# Patient Record
Sex: Male | Born: 1950
Health system: Southern US, Community
[De-identification: ages and names within clinical notes are randomized; demographics above are authoritative.]

## PROBLEM LIST (undated history)

## (undated) DIAGNOSIS — F17200 Nicotine dependence, unspecified, uncomplicated: Secondary | ICD-10-CM

## (undated) DIAGNOSIS — I6529 Occlusion and stenosis of unspecified carotid artery: Secondary | ICD-10-CM

## (undated) DIAGNOSIS — J45909 Unspecified asthma, uncomplicated: Secondary | ICD-10-CM

## (undated) DIAGNOSIS — M1990 Unspecified osteoarthritis, unspecified site: Secondary | ICD-10-CM

## (undated) DIAGNOSIS — E785 Hyperlipidemia, unspecified: Secondary | ICD-10-CM

## (undated) DIAGNOSIS — H409 Unspecified glaucoma: Secondary | ICD-10-CM

## (undated) DIAGNOSIS — I739 Peripheral vascular disease, unspecified: Secondary | ICD-10-CM

## (undated) DIAGNOSIS — I10 Essential (primary) hypertension: Secondary | ICD-10-CM

## (undated) DIAGNOSIS — J449 Chronic obstructive pulmonary disease, unspecified: Secondary | ICD-10-CM

## (undated) HISTORY — PX: KNEE ARTHROSCOPY: SUR90

## (undated) HISTORY — DX: Unspecified glaucoma: H40.9

## (undated) HISTORY — DX: Peripheral vascular disease, unspecified: I73.9

## (undated) HISTORY — DX: Hyperlipidemia, unspecified: E78.5

## (undated) HISTORY — DX: Occlusion and stenosis of unspecified carotid artery: I65.29

## (undated) HISTORY — DX: Essential (primary) hypertension: I10

## (undated) HISTORY — DX: Unspecified osteoarthritis, unspecified site: M19.90

## (undated) HISTORY — DX: Nicotine dependence, unspecified, uncomplicated: F17.200

---

## 2004-10-10 ENCOUNTER — Ambulatory Visit: Payer: Self-pay | Admitting: Family Medicine

## 2004-10-29 ENCOUNTER — Ambulatory Visit (HOSPITAL_COMMUNITY): Admission: RE | Admit: 2004-10-29 | Discharge: 2004-10-29 | Payer: Self-pay | Admitting: Family Medicine

## 2004-10-31 ENCOUNTER — Ambulatory Visit: Payer: Self-pay | Admitting: Family Medicine

## 2004-12-10 ENCOUNTER — Ambulatory Visit: Payer: Self-pay | Admitting: Family Medicine

## 2005-02-03 ENCOUNTER — Ambulatory Visit: Payer: Self-pay | Admitting: Family Medicine

## 2005-06-21 ENCOUNTER — Encounter: Payer: Self-pay | Admitting: Family Medicine

## 2005-06-26 ENCOUNTER — Ambulatory Visit: Payer: Self-pay | Admitting: Family Medicine

## 2005-09-15 ENCOUNTER — Ambulatory Visit: Payer: Self-pay | Admitting: Family Medicine

## 2005-10-06 ENCOUNTER — Ambulatory Visit: Payer: Self-pay | Admitting: Family Medicine

## 2005-11-05 ENCOUNTER — Ambulatory Visit: Payer: Self-pay | Admitting: Family Medicine

## 2005-12-08 ENCOUNTER — Ambulatory Visit: Payer: Self-pay | Admitting: Family Medicine

## 2006-12-17 ENCOUNTER — Ambulatory Visit: Payer: Self-pay | Admitting: Family Medicine

## 2006-12-17 LAB — CONVERTED CEMR LAB
ALT: 10 units/L (ref 0–53)
AST: 17 units/L (ref 0–37)
Albumin: 3.9 g/dL (ref 3.5–5.2)
Alkaline Phosphatase: 72 units/L (ref 39–117)
BUN: 12 mg/dL (ref 6–23)
Basophils Absolute: 0 10*3/uL (ref 0.0–0.1)
Basophils Relative: 1 % (ref 0–1)
Bilirubin, Direct: 0.1 mg/dL (ref 0.0–0.3)
CO2: 26 meq/L (ref 19–32)
Calcium: 9 mg/dL (ref 8.4–10.5)
Chloride: 105 meq/L (ref 96–112)
Cholesterol: 177 mg/dL (ref 0–200)
Creatinine, Ser: 1.01 mg/dL (ref 0.40–1.50)
Eosinophils Absolute: 0.3 10*3/uL (ref 0.0–0.7)
Eosinophils Relative: 5 % (ref 0–5)
Glucose, Bld: 87 mg/dL (ref 70–99)
HCT: 42.3 % (ref 39.0–52.0)
HDL: 42 mg/dL (ref >39)
Hemoglobin: 14.3 g/dL (ref 13.0–17.0)
Indirect Bilirubin: 0.2 mg/dL (ref 0.0–0.9)
LDL Cholesterol: 113 mg/dL — ABNORMAL HIGH (ref 0–99)
Lymphocytes Relative: 41 % (ref 12–46)
Lymphs Abs: 2.6 10*3/uL (ref 0.7–3.3)
MCHC: 33.8 g/dL (ref 30.0–36.0)
MCV: 92.6 fL (ref 78.0–100.0)
Monocytes Absolute: 0.6 10*3/uL (ref 0.2–0.7)
Monocytes Relative: 9 % (ref 3–11)
Neutro Abs: 2.8 10*3/uL (ref 1.7–7.7)
Neutrophils Relative %: 44 % (ref 43–77)
PSA: 1.59 ng/mL (ref 0.10–4.00)
Platelets: 331 10*3/uL (ref 150–400)
Potassium: 4.2 meq/L (ref 3.5–5.3)
RBC: 4.57 M/uL (ref 4.22–5.81)
RDW: 14.1 % — ABNORMAL HIGH (ref 11.5–14.0)
Sodium: 141 meq/L (ref 135–145)
Total Bilirubin: 0.3 mg/dL (ref 0.3–1.2)
Total CHOL/HDL Ratio: 4.2
Total Protein: 6.8 g/dL (ref 6.0–8.3)
Triglycerides: 111 mg/dL (ref <150)
VLDL: 22 mg/dL (ref 0–40)
WBC: 6.3 10*3/uL (ref 4.0–10.5)

## 2008-02-24 DIAGNOSIS — I10 Essential (primary) hypertension: Secondary | ICD-10-CM | POA: Insufficient documentation

## 2008-02-24 DIAGNOSIS — F17200 Nicotine dependence, unspecified, uncomplicated: Secondary | ICD-10-CM | POA: Insufficient documentation

## 2008-03-02 ENCOUNTER — Ambulatory Visit: Payer: Self-pay | Admitting: Family Medicine

## 2008-04-12 ENCOUNTER — Encounter: Payer: Self-pay | Admitting: Family Medicine

## 2008-04-12 LAB — CONVERTED CEMR LAB
BUN: 17 mg/dL (ref 6–23)
Basophils Absolute: 0 10*3/uL (ref 0.0–0.1)
Basophils Relative: 0 % (ref 0–1)
CO2: 26 meq/L (ref 19–32)
Calcium: 9.5 mg/dL (ref 8.4–10.5)
Chloride: 105 meq/L (ref 96–112)
Cholesterol: 203 mg/dL — ABNORMAL HIGH (ref 0–200)
Creatinine, Ser: 1.21 mg/dL (ref 0.40–1.50)
Eosinophils Absolute: 0.3 10*3/uL (ref 0.0–0.7)
Eosinophils Relative: 5 % (ref 0–5)
Glucose, Bld: 88 mg/dL (ref 70–99)
HCT: 43 % (ref 39.0–52.0)
HDL: 40 mg/dL (ref >39)
Hemoglobin: 14.6 g/dL (ref 13.0–17.0)
LDL Cholesterol: 133 mg/dL — ABNORMAL HIGH (ref 0–99)
Lymphocytes Relative: 51 % — ABNORMAL HIGH (ref 12–46)
Lymphs Abs: 3.2 10*3/uL (ref 0.7–4.0)
MCHC: 34 g/dL (ref 30.0–36.0)
MCV: 90.9 fL (ref 78.0–100.0)
Monocytes Absolute: 0.4 10*3/uL (ref 0.1–1.0)
Monocytes Relative: 6 % (ref 3–12)
Neutro Abs: 2.4 10*3/uL (ref 1.7–7.7)
Neutrophils Relative %: 38 % — ABNORMAL LOW (ref 43–77)
PSA: 2.53 ng/mL (ref 0.10–4.00)
Platelets: 340 10*3/uL (ref 150–400)
Potassium: 4.6 meq/L (ref 3.5–5.3)
RBC: 4.73 M/uL (ref 4.22–5.81)
RDW: 14 % (ref 11.5–15.5)
Sodium: 142 meq/L (ref 135–145)
Total CHOL/HDL Ratio: 5.1
Triglycerides: 152 mg/dL — ABNORMAL HIGH (ref <150)
VLDL: 30 mg/dL (ref 0–40)
WBC: 6.4 10*3/uL (ref 4.0–10.5)

## 2008-05-02 ENCOUNTER — Ambulatory Visit: Payer: Self-pay | Admitting: Family Medicine

## 2008-05-03 ENCOUNTER — Ambulatory Visit (HOSPITAL_COMMUNITY): Admission: RE | Admit: 2008-05-03 | Discharge: 2008-05-03 | Payer: Self-pay | Admitting: Family Medicine

## 2008-05-03 ENCOUNTER — Encounter: Payer: Self-pay | Admitting: Family Medicine

## 2008-05-03 LAB — CONVERTED CEMR LAB
Bilirubin Urine: NEGATIVE
Hemoglobin, Urine: NEGATIVE
Ketones, ur: NEGATIVE mg/dL
Leukocytes, UA: NEGATIVE
Nitrite: NEGATIVE
Protein, ur: NEGATIVE mg/dL
Specific Gravity, Urine: 1.023 (ref 1.005–1.03)
Urine Glucose: NEGATIVE mg/dL
Urobilinogen, UA: 1 (ref 0.0–1.0)
pH: 6.5 (ref 5.0–8.0)

## 2008-12-11 ENCOUNTER — Telehealth: Payer: Self-pay | Admitting: Family Medicine

## 2008-12-13 ENCOUNTER — Telehealth: Payer: Self-pay | Admitting: Family Medicine

## 2008-12-19 ENCOUNTER — Ambulatory Visit: Payer: Self-pay | Admitting: Family Medicine

## 2008-12-19 DIAGNOSIS — M5442 Lumbago with sciatica, left side: Secondary | ICD-10-CM | POA: Insufficient documentation

## 2008-12-19 LAB — CONVERTED CEMR LAB
ALT: 15 units/L (ref 0–53)
AST: 19 units/L (ref 0–37)
Albumin: 4.2 g/dL (ref 3.5–5.2)
Alkaline Phosphatase: 80 units/L (ref 39–117)
BUN: 10 mg/dL (ref 6–23)
Bilirubin, Direct: 0.1 mg/dL (ref 0.0–0.3)
CO2: 23 meq/L (ref 19–32)
Calcium: 9 mg/dL (ref 8.4–10.5)
Chloride: 103 meq/L (ref 96–112)
Cholesterol: 208 mg/dL — ABNORMAL HIGH (ref 0–200)
Creatinine, Ser: 0.94 mg/dL (ref 0.40–1.50)
Glucose, Bld: 90 mg/dL (ref 70–99)
HDL: 64 mg/dL (ref >39)
Indirect Bilirubin: 0.5 mg/dL (ref 0.0–0.9)
LDL Cholesterol: 126 mg/dL — ABNORMAL HIGH (ref 0–99)
Potassium: 3.7 meq/L (ref 3.5–5.3)
Sodium: 141 meq/L (ref 135–145)
Total Bilirubin: 0.6 mg/dL (ref 0.3–1.2)
Total CHOL/HDL Ratio: 3.3
Total Protein: 7.2 g/dL (ref 6.0–8.3)
Triglycerides: 89 mg/dL (ref <150)
VLDL: 18 mg/dL (ref 0–40)

## 2009-03-12 ENCOUNTER — Ambulatory Visit: Payer: Self-pay | Admitting: Family Medicine

## 2009-04-04 ENCOUNTER — Ambulatory Visit (HOSPITAL_COMMUNITY): Admission: RE | Admit: 2009-04-04 | Discharge: 2009-04-04 | Payer: Self-pay | Admitting: Orthopaedic Surgery

## 2009-05-28 ENCOUNTER — Telehealth: Payer: Self-pay | Admitting: Family Medicine

## 2009-06-18 ENCOUNTER — Encounter: Payer: Self-pay | Admitting: Family Medicine

## 2009-06-21 LAB — CONVERTED CEMR LAB
Cholesterol: 187 mg/dL (ref 0–200)
HDL: 41 mg/dL (ref >39)
LDL Cholesterol: 122 mg/dL — ABNORMAL HIGH (ref 0–99)
PSA: 2.44 ng/mL (ref 0.10–4.00)
Total CHOL/HDL Ratio: 4.6
Triglycerides: 119 mg/dL (ref <150)
VLDL: 24 mg/dL (ref 0–40)

## 2009-07-17 ENCOUNTER — Telehealth: Payer: Self-pay | Admitting: Family Medicine

## 2009-07-17 ENCOUNTER — Ambulatory Visit: Payer: Self-pay | Admitting: Family Medicine

## 2009-07-17 DIAGNOSIS — J209 Acute bronchitis, unspecified: Secondary | ICD-10-CM | POA: Insufficient documentation

## 2009-07-17 LAB — CONVERTED CEMR LAB: Glucose, Bld: 101 mg/dL

## 2009-10-05 ENCOUNTER — Encounter: Payer: Self-pay | Admitting: Family Medicine

## 2010-06-24 ENCOUNTER — Emergency Department (HOSPITAL_COMMUNITY): Admission: EM | Admit: 2010-06-24 | Discharge: 2010-06-24 | Payer: Self-pay | Admitting: Emergency Medicine

## 2010-06-27 ENCOUNTER — Ambulatory Visit: Payer: Self-pay | Admitting: Family Medicine

## 2010-06-27 DIAGNOSIS — R5381 Other malaise: Secondary | ICD-10-CM | POA: Insufficient documentation

## 2010-06-27 DIAGNOSIS — R5383 Other fatigue: Secondary | ICD-10-CM

## 2010-06-27 DIAGNOSIS — J440 Chronic obstructive pulmonary disease with acute lower respiratory infection: Secondary | ICD-10-CM

## 2010-06-27 DIAGNOSIS — J209 Acute bronchitis, unspecified: Secondary | ICD-10-CM | POA: Insufficient documentation

## 2010-09-07 ENCOUNTER — Emergency Department (HOSPITAL_COMMUNITY): Admission: EM | Admit: 2010-09-07 | Discharge: 2010-09-07 | Payer: Self-pay | Admitting: Emergency Medicine

## 2010-10-10 ENCOUNTER — Telehealth: Payer: Self-pay | Admitting: Family Medicine

## 2010-11-21 ENCOUNTER — Ambulatory Visit: Admit: 2010-11-21 | Payer: Self-pay | Admitting: Family Medicine

## 2010-11-26 NOTE — Assessment & Plan Note (Signed)
Summary: office visit   Vital Signs:  Patient Profile:   60 Years Old Male Height:     74 inches (187.96 cm) Weight:      189 pounds (85.91 kg) BMI:     24.35 BSA:     2.12 O2 Sat:      92 % Pulse rate:   75 / minute Resp:     16 per minute BP sitting:   160 / 90  (left arm)  Vitals Entered By: Everitt Amber (December 19, 2008 10:42 AM)                 Chief Complaint:  Chest cold, coughing, and nasal congestion.  History of Present Illness: 1 week h/o head and chest congestion, yellow sputum no fever  or chills.  Intermitteny low back pain, advil relieves it. He reports recent fever and chills. He denies depression, anxiety or insomnia. He is  compliant with bP meds.    Current Allergies: ! PENICILLIN    Risk Factors:  Tobacco use:  current Drug use:  no Alcohol use:  yes Seatbelt use:  100 %   Review of Systems  General      See HPI      Complains of chills and fever.  ENT      See HPI      Complains of nasal congestion, postnasal drainage, sinus pressure, and sore throat.      Denies earache.  CV      Denies chest pain or discomfort, difficulty breathing while lying down, palpitations, and swelling of feet.  Resp      See HPI      Complains of cough, shortness of breath, and sputum productive.  GI      Denies abdominal pain, constipation, diarrhea, nausea, and vomiting.  GU      Denies dysuria, urinary frequency, and urinary hesitancy.  MS      Complains of joint pain and stiffness.  Derm      Denies itching, lesion(s), and rash.  Psych      Denies anxiety and depression.   Physical Exam  General:     HEENT: No facial asymmetry,  EOMI, positive sinus tenderness, TM's Clear, oropharynx  pink and moist.   Chest: decreased air entry, bilateral crackles and whezes CVS: S1, S2, No murmurs, No S3.   Abd: Soft, Nontender.  MS: Adequate ROM spine, hips, shoulders and knees.  Ext: No edema.   CNS: CN 2-12 intact, power tone and  sensation normal throughout.   Skin: Intact, no visible lesions or rashes.  Psych: Good eye contact, normal effect.  Memory intact, not anxious or depressed appearing.     Impression & Recommendations:  Problem # 1:  HYPERTENSION (ICD-401.9) Assessment: Comment Only  The following medications were removed from the medication list:    Amlodipine Besylate 5 Mg Tabs (Amlodipine besylate) ..... One tab by mouth qd  His updated medication list for this problem includes:    Amlodipine Besylate 10 Mg Tabs (Amlodipine besylate) .Marland Kitchen... Take 1 tablet by mouth once a day  Orders: T-Basic Metabolic Panel [id number]  BP today: 160/90  Labs Reviewed: Creat: 1.21 (04/12/2008) Chol: 203 (04/12/2008)   HDL: 40 (04/12/2008)   LDL: 133 (04/12/2008)   TG: 152 (04/12/2008)   Problem # 2:  NICOTINE ADDICTION (ICD-305.1) Assessment: Unchanged Encouraged smoking cessation and discussed different methods for smoking cessation.   Problem # 3:  OTHER AND UNSPECIFIED HYPERLIPIDEMIA (ICD-272.4) Assessment: Comment Only  Labs Reviewed:  Chol: 203 (04/12/2008)   HDL: 40 (04/12/2008)   LDL: 133 (04/12/2008)   TG: 152 (04/12/2008), low fat diet discussed and encouraged SGOT: 17 (12/17/2006)   SGPT: 10 (12/17/2006)   Problem # 4:  ACUTE BRONCHITIS (ICD-466.0) Assessment: Comment Only  His updated medication list for this problem includes:    Septra Ds 800-160 Mg Tabs (Sulfamethoxazole-trimethoprim) .Marland Kitchen... Take 1 tablet by mouth two times a day    Tessalon Perles 100 Mg Caps (Benzonatate) .Marland Kitchen... Take 1 capsule by mouth three times a day  Take antibiotics and other medications as directed. Encouraged to push clear liquids, get enough rest, and take acetaminophen as needed. To be seen in 5-7 days if no improvement, sooner if worse.   Problem # 5:  ACUTE SINUSITIS, UNSPECIFIED (ICD-461.9) Assessment: Comment Only  His updated medication list for this problem includes:    Septra Ds 800-160 Mg Tabs  (Sulfamethoxazole-trimethoprim) .Marland Kitchen... Take 1 tablet by mouth two times a day    Tessalon Perles 100 Mg Caps (Benzonatate) .Marland Kitchen... Take 1 capsule by mouth three times a day Instructed on treatment. Call if symptoms persist or worsen.   Complete Medication List: 1)  Aspirin 81 Mg Tbec (Aspirin) .... Take 1 tablet by mouth once a day 2)  Septra Ds 800-160 Mg Tabs (Sulfamethoxazole-trimethoprim) .... Take 1 tablet by mouth two times a day 3)  Tessalon Perles 100 Mg Caps (Benzonatate) .... Take 1 capsule by mouth three times a day 4)  Amlodipine Besylate 10 Mg Tabs (Amlodipine besylate) .... Take 1 tablet by mouth once a day  Other Orders: T-Hepatic Function [id number]) T-Lipid Profile [id number])   Patient Instructions: 1)  Please schedule a follow-up appointment in 2 months. 2)  BMP prior to visit, ICD-9: 3)  Hepatic Panel prior to visit, ICD-9:  today 4)  Lipid Panel prior to visit, ICD-9: 5)  The BP med is INCREASED to 10mg  daily, take two 5mg  tabs till finished. 6)  Meds are sent for your bronchitis and sinusitis   Prescriptions: AMLODIPINE BESYLATE 10 MG TABS (AMLODIPINE BESYLATE) Take 1 tablet by mouth once a day  #30 x 2   Entered and Authorized by:   Syliva Overman MD   Signed by:   Syliva Overman MD on 12/19/2008   Method used:   Electronically to        [facility] [phone]* (retail)       [address]       [place], [place]  [address]       Ph: [phone] or [phone]       Fax: [phone]   RxID:   [id number] TESSALON PERLES 100 MG CAPS (BENZONATATE) Take 1 capsule by mouth three times a day  #30 x 0   Entered and Authorized by:   Syliva Overman MD   Signed by:   Syliva Overman MD on 12/19/2008   Method used:   Electronically to        [facility] [phone]* (retail)       [address]       [place], [place]  [address]       Ph: [phone] or [phone]       Fax: [phone]   RxID:    [id number] SEPTRA DS 800-160 MG TABS (SULFAMETHOXAZOLE-TRIMETHOPRIM) Take 1 tablet by mouth two times a day  #20 x 0   Entered and Authorized by:   Syliva Overman  MD   Signed by:   Syliva Overman MD on 12/19/2008   Method used:   Electronically to        [facility] [phone]* (retail)       [address]       [place], [place]  [address]       Ph: [phone] or [phone]       Fax: [phone]   RxID:   [id number]

## 2010-11-26 NOTE — Miscellaneous (Signed)
Summary: refill  Clinical Lists Changes  Medications: Rx of NORVASC 10 MG TABS (AMLODIPINE BESYLATE) Take 1 tablet by mouth once a day;  #90 Tablet x 2;  Signed;  Entered by: Worthy Keeler LPN;  Authorized by: Syliva Overman MD;  Method used: Electronically to [facility]. [phone]*, [address] [place], [place], [place]  [address], Ph: [phone] or [phone], Fax: [phone]    Prescriptions: NORVASC 10 MG TABS (AMLODIPINE BESYLATE) Take 1 tablet by mouth once a day  #90 Tablet x 2   Entered by:   Worthy Keeler LPN   Authorized by:   Syliva Overman MD   Signed by:   Worthy Keeler LPN on 52/84/1324   Method used:   Electronically to        [facility] [phone]* (retail)       [address]       [place], [place]  [address]       Ph: [phone] or [phone]       Fax: [phone]   RxID:   [id number]

## 2010-11-26 NOTE — Progress Notes (Signed)
Summary: HAS A COLD  Phone Note Call from Patient   Summary of Call: NEEDS ANT. FOR HIS COLD      [facility] IN [place] CALL HIM BACK TO LET HIM KNOW    [phone] HAS APPT. IN FIRST OF OCTOBER FOR PHY. Initial call taken by: Lind Guest,  July 17, 2009 1:40 PM  Follow-up for Phone Call        stuffy nose, congestion, cough wth clear sputum, no fever, no chills, no body aches Follow-up by: Worthy Keeler LPN,  July 17, 2009 1:58 PM  Additional Follow-up for Phone Call Additional follow up Details #1::        pls work in this week, uncontrolled blood pressure, alos advise  and erx tessalon perles 100mg  Take 1 capsule by mouth three times a day 330 only, his last visit was in may Additional Follow-up by: Syliva Overman MD,  July 17, 2009 2:35 PM    Additional Follow-up for Phone Call Additional follow up Details #2::    rx sent , called patient, left message Follow-up by: Worthy Keeler LPN,  July 17, 2009 2:51 PM  Additional Follow-up for Phone Call Additional follow up Details #3:: Details for Additional Follow-up Action Taken: patient aware Additional Follow-up by: Worthy Keeler LPN,  July 17, 2009 2:59 PM  New/Updated Medications: TESSALON PERLES 100 MG CAPS (BENZONATATE) one tab by mouth tid Prescriptions: TESSALON PERLES 100 MG CAPS (BENZONATATE) one tab by mouth tid  #30 x 0   Entered by:   Worthy Keeler LPN   Authorized by:   Syliva Overman MD   Signed by:   Worthy Keeler LPN on 16/07/9603   Method used:   Electronically to        [facility] [phone]* (retail)       [address]       [place], [place]  [address]       Ph: [phone] or [phone]       Fax: [phone]   RxID:   [id number]

## 2010-11-26 NOTE — Progress Notes (Signed)
Summary: rx refill  Phone Note Call from Patient   Summary of Call: has appt on 12/19/2008 to see [place]. pt out of blood pressure pills. can doc call in enough until he comes in [place] [phone] Initial call taken by: Rudene Anda,  December 13, 2008 1:28 PM      Prescriptions: AMLODIPINE BESYLATE 5 MG TABS (AMLODIPINE BESYLATE) one tab by mouth qd  #30 x 2   Entered by:   Everitt Amber   Authorized by:   Syliva Overman MD   Signed by:   Everitt Amber on 12/13/2008   Method used:   Electronically to        [facility] [phone]* (retail)       [address]       [place], [place]  [address]       Ph: [phone] or [phone]       Fax: [phone]   RxID:   [id number]

## 2010-11-26 NOTE — Progress Notes (Signed)
Summary: REFILL  Phone Note Call from Patient   Summary of Call: NEEDS AMLOEITIN 5MG   1 PER DAY FAXED OVER TO [facility] IN [place]  Initial call taken by: Lind Guest,  December 11, 2008 1:35 PM  Follow-up for Phone Call        refill x 1 only, needs ov let hiom know Follow-up by: Syliva Overman MD,  December 11, 2008 2:51 PM    Additional Follow-up for Phone Call Additional follow up Details #2::    pls give appt in the next 2 to 3 weeks he NEEDS to come in Follow-up by: Syliva Overman MD,  December 11, 2008 2:53 PM  New/Updated Medications: AMLODIPINE BESYLATE 5 MG TABS (AMLODIPINE BESYLATE) one tab by mouth qd   Prescriptions: AMLODIPINE BESYLATE 5 MG TABS (AMLODIPINE BESYLATE) one tab by mouth qd  #30 x 0   Entered by:   Worthy Keeler LPN   Authorized by:   Syliva Overman MD   Signed by:   Worthy Keeler LPN on 16/07/9603   Method used:   Electronically to        [facility] [phone]* (retail)       [address]       [place], [place]  [address]       Ph: [phone] or [phone]       Fax: [phone]   RxID:   [id number]   Appended Document: REFILL LEFT MESSAGE YESTERDAY AND TODAY

## 2010-11-26 NOTE — Progress Notes (Signed)
Summary: MEDS  Phone Note Call from Patient   Summary of Call: NEEDS HIS BP MEDS FILLED AT [facility] IN [place]  Initial call taken by: Lind Guest,  May 28, 2009 1:38 PM    Prescriptions: NORVASC 10 MG TABS (AMLODIPINE BESYLATE) Take 1 tablet by mouth once a day  #30 Tablet x 3   Entered by:   Worthy Keeler LPN   Authorized by:   Syliva Overman MD   Signed by:   Worthy Keeler LPN on 16/07/9603   Method used:   Electronically to        [facility] [phone]* (retail)       [address]       [place], [place]  [address]       Ph: [phone] or [phone]       Fax: [phone]   RxID:   [id number]

## 2010-11-26 NOTE — Assessment & Plan Note (Signed)
Summary: F UP FROM HOS   Vital Signs:  Patient profile:   60 year old male Height:      74 inches Weight:      178.25 pounds BMI:     22.97 O2 Sat:      98 % Pulse rate:   70 / minute Pulse rhythm:   regular Resp:     16 per minute BP sitting:   130 / 80  (right arm)  Vitals Entered By: Everitt Amber LPN (June 27, 2010 9:04 AM) CC: ER follow up. States he went to the ER Monday because he was having trouble breathing. He could breathe if he went outside but once he went back in and laid down he would have problems again. They gave him an inhaler and some pills and he has cut back tremendously on his smoking. Wants to see about getting the patch today    CC:  ER follow up. States he went to the ER Monday because he was having trouble breathing. He could breathe if he went outside but once he went back in and laid down he would have problems again. They gave him an inhaler and some pills and he has cut back tremendously on his smoking. Wants to see about getting the patch today .  History of Present Illness: Pt recently evaluated in the ED for acute exaccerbation of COPD, he feels netter following treartment AND IS NOW COMMITED TO QUITTING SMOKING. hE DOES REPORT GREEN SPUTUM AND INTERMITTENT CHILLS. l.  Denies sinus pressure, nasal congestion , ear pain or sore throat.  Denies chest pain, palpitations, PND, orthopnea or leg swelling. Denies abdominal pain, nausea, vomitting, diarrhea or constipation. Denies change in bowel movements or bloody stool. Denies dysuria , frequency, incontinence or hesitancy. Denies  joint pain, swelling, or reduced mobility. Denies headaches, vertigo, seizures. Denies depression, anxiety or insomnia. Denies  rash, lesions, or itch.     Preventive Screening-Counseling & Management  Alcohol-Tobacco     Smoking Status: current     Packs/Day: 0.25  Current Medications (verified): 1)  Aspirin 81 Mg  Tbec (Aspirin) .... Take 1 Tablet By Mouth Once  A Day 2)  Norvasc 10 Mg Tabs (Amlodipine Besylate) .... Take 1 Tablet By Mouth Once A Day  Allergies (verified): 1)  ! Penicillin  Review of Systems      See HPI General:  Complains of chills, fatigue, and malaise. Eyes:  Denies discharge, eye pain, and red eye. Resp:  Complains of cough, shortness of breath, and sputum productive; pt developed acute dyspnea with shortness of breath, wheeze and sputum which was green, he went to the Ed , got 2 neb treatments is on prodnisone 50mg  x 5 days anda bentolion inhaler. He smokes on avg 3 ciggs /dayNo fever or chills. feels well, still has scant yellow sputum. Endo:  Denies cold intolerance, excessive thirst, excessive urination, and heat intolerance. Heme:  Denies abnormal bruising and bleeding. Allergy:  Complains of seasonal allergies; denies hives or rash, itching eyes, and persistent infections.  Physical Exam  General:  Well-developed,well-nourished,in no acute distress; alert,appropriate and cooperative throughout examination HEENT: No facial asymmetry,  EOMI, No sinus tenderness, TM's Clear, oropharynx  pink and moist.   Chest:decreased air entry bilaterally, scattered crackles, no wheezes CVS: S1, S2, No murmurs, No S3.   Abd: Soft, Nontender.  MS: Adequate ROM spine, hips, shoulders and knees.  Ext: No edema.   CNS: CN 2-12 intact, power tone and sensation normal throughout.  Skin: Intact, no visible lesions or rashes.  Psych: Good eye contact, normal affect.  Memory intact, not anxious or depressed appearing.    Impression & Recommendations:  Problem # 1:  COPD (ICD-496) Assessment Deteriorated depomedrol administered  Problem # 2:  ACUTE BRONCHITIS (ICD-466.0) Assessment: Comment Only  The following medications were removed from the medication list:    Septra Ds 800-160 Mg Tabs (Sulfamethoxazole-trimethoprim) .Marland Kitchen... Take 1 tablet by mouth two times a day    Tessalon Perles 100 Mg Caps (Benzonatate) .Marland Kitchen... Take 1 capsule  by mouth three times a day His updated medication list for this problem includes:    Sulfamethoxazole-trimethoprim 400-80 Mg Tabs (Sulfamethoxazole-trimethoprim) .Marland Kitchen... Take 1 tablet by mouth two times a day  Orders: Depo- Medrol 80mg  (J1040) Admin of Therapeutic Inj  intramuscular or subcutaneous ([address])  Problem # 3:  NICOTINE ADDICTION (ICD-305.1) Assessment: Improved  Encouraged smoking cessation and discussed different methods for smoking cessation.  pt decided against trhe patch, smoking on avg 3/day ready to quit  Problem # 4:  HYPERTENSION (ICD-401.9) Assessment: Unchanged  The following medications were removed from the medication list:    Norvasc 10 Mg Tabs (Amlodipine besylate) .Marland Kitchen... Take 1 tablet by mouth once a day His updated medication list for this problem includes:    Amlodipine Besylate 10 Mg Tabs (Amlodipine besylate) .Marland Kitchen... Take 1 tablet by mouth once a day  Orders: T-Basic Metabolic Panel [id number])  BP today: 130/80 Prior BP: 122/82 (07/17/2009)  Labs Reviewed: K+: 3.7 (12/19/2008) Creat: : 0.94 (12/19/2008)   Chol: 187 (06/18/2009)   HDL: 41 (06/18/2009)   LDL: 122 (06/18/2009)   TG: 119 (06/18/2009)  Problem # 5:  OTHER AND UNSPECIFIED HYPERLIPIDEMIA (ICD-272.4) Assessment: Comment Only  Labs Reviewed: SGOT: 19 (12/19/2008)   SGPT: 15 (12/19/2008)   HDL:41 (06/18/2009), 64 (12/19/2008)  LDL:122 (06/18/2009), 126 (12/19/2008)  Chol:187 (06/18/2009), 208 (12/19/2008)  Trig:119 (06/18/2009), 89 (12/19/2008) low faT DIET DISCAUSSED AND ENCOURAGED  Complete Medication List: 1)  Aspirin 81 Mg Tbec (Aspirin) .... Take 1 tablet by mouth once a day 2)  Sulfamethoxazole-trimethoprim 400-80 Mg Tabs (Sulfamethoxazole-trimethoprim) .... Take 1 tablet by mouth two times a day 3)  Amlodipine Besylate 10 Mg Tabs (Amlodipine besylate) .... Take 1 tablet by mouth once a day  Other Orders: T-Lipid Profile ([id number]) T-CBC w/Diff ([id number]) T-PSA  ([id number])  Patient Instructions: 1)  CPE in  in  5.39months 2)  I am glad that you are feeling better.You will get an injection for the breathing , depomedrol, and antibiotic is sent toyour pharmacy. 3)  BMP prior to visit, ICD-9: 4)  Lipid Panel prior to visit, ICD-9: 5)  CBC w/ Diff prior to visit, ICD-9:   fasting in 5.5 months 6)  PSA prior to visit, ICD-9: Prescriptions: AMLODIPINE BESYLATE 10 MG TABS (AMLODIPINE BESYLATE) Take 1 tablet by mouth once a day  #90 x 1   Entered and Authorized by:   Syliva Overman MD   Signed by:   Syliva Overman MD on 06/27/2010   Method used:   Electronically to        [facility] [phone]* (retail)       [address]       [place], [place]  [address]       Ph: [phone] or [phone]       Fax: [phone]   RxID:   [id number] SULFAMETHOXAZOLE-TRIMETHOPRIM 400-80 MG TABS (SULFAMETHOXAZOLE-TRIMETHOPRIM) Take 1 tablet by mouth two times a day  #  20 x 0   Entered and Authorized by:   Syliva Overman MD   Signed by:   Syliva Overman MD on 06/27/2010   Method used:   Electronically to        [facility]. [phone]* (retail)       [address]       [place], [place]  [address]       Ph: [phone] or [phone]       Fax: [phone]   RxID:   [id number]    Medication Administration  Injection # 1:    Medication: Depo- Medrol 80mg     Diagnosis: ACUTE BRONCHITIS (ICD-466.0)    Route: IM    Site: RUOQ gluteus    Exp Date: 03/2011    Lot #: [id number]    Mfr: [employer]    Comments: 80mg  given     Patient tolerated injection without complications    Given by: Everitt Amber LPN (June 27, 2010 10:06 AM)  Orders Added: 1)  Est. Patient Level IV [16967] 2)  T-Basic Metabolic Panel [[id number]] 3)  T-Lipid Profile [[id number]] 4)  T-CBC w/Diff [[id number]] 5)  T-PSA [[id number]] 6)  Depo- Medrol 80mg  [J1040] 7)  Admin of Therapeutic Inj  intramuscular or subcutaneous [15400]

## 2010-11-26 NOTE — Assessment & Plan Note (Signed)
Summary: OV   Vital Signs:  Patient profile:   60 year old male Height:      74 inches Weight:      175.44 pounds BMI:     22.61 O2 Sat:      97 % Temp:     98.8 degrees F oral Pulse rate:   85 / minute Pulse rhythm:   regular Resp:     16 per minute BP sitting:   122 / 82  (left arm) Cuff size:   regular  Vitals Entered By: Everitt Amber (July 17, 2009 3:21 PM) CC: nasal drainage, clear, coughing, sneezing for about a week now Is Patient Diabetic? No Pain Assessment Patient in pain? no        CC:  nasal drainage, clear, coughing, and sneezing for about a week now.  History of Present Illness: Pt presents with a  2week h/o worsening had and cheswt congeswtion. he has had intermittent fever and chills, he feels fatigued, his sputum is yellow. Otherwise the pt has been doing well prior to onset of current symptoms. He unfortunately still smokes and is not yet commited to disconti nuing the habit..   Current Medications (verified): 1)  Aspirin 81 Mg  Tbec (Aspirin) .... Take 1 Tablet By Mouth Once A Day 2)  Norvasc 10 Mg Tabs (Amlodipine Besylate) .... Take 1 Tablet By Mouth Once A Day  Allergies (verified): 1)  ! Penicillin  Past History:  Past medical, surgical, family and social histories (including risk factors) reviewed for relevance to current acute and chronic problems.  Past Medical History: Reviewed history from 02/24/2008 and no changes required. NICOTINE ADDICTION (ICD-305.1) HYPERTENSION (ICD-401.9)  Past Surgical History: Reviewed history from 02/24/2008 and no changes required. Arthroscopy both knees   Family History: Reviewed history from 02/24/2008 and no changes required. NO CHILDREN MOTHER DECEASED  HEART ATTACK / HYPERTENSIVE / DIABETIC FATHER  DECEASED  ? THREE SISTERS LIVING / ONE HYPERTENSIVE FIVE BROTHERS LIVING / ONE HYPERTENSIVE  Social History: Reviewed history from 02/24/2008 and no changes required. EMPLOYED Single Current  Smoker Alcohol use-yes Drug use-no  Review of Systems      See HPI General:  Complains of chills, fatigue, fever, and weight loss; 2 week history of chills and fever.. Eyes:  Denies blurring and discharge. ENT:  Complains of nasal congestion and postnasal drainage; denies sinus pressure and sore throat; 2 week history, clear nasal drainage. CV:  Denies chest pain or discomfort, palpitations, and swelling of feet. Resp:  Complains of cough, shortness of breath, sputum productive, and wheezing; sputum is yellow x 2 weeks. GI:  Denies abdominal pain, constipation, diarrhea, nausea, and vomiting. GU:  Denies nocturia, urinary frequency, and urinary hesitancy. MS:  Denies joint pain, low back pain, mid back pain, and stiffness. Derm:  Denies itching and rash. Psych:  Denies anxiety and depression. Allergy:  Denies hives or rash and sneezing.  Physical Exam  General:  alert, well-nourished, and well-hydrated. Ill appearing HEENT: No facial asymmetry,  EOMI, No sinus tenderness, TM's Clear, oropharynx  pink and moist. Erythema and edma of nasal mucosa.  Chest: decreased air entry bilaterally, scattered crackles and wheezes. CVS: S1, S2, No murmurs, No S3.   Abd: Soft, Nontender.  MS: Adequate ROM spine, hips, shoulders and knees.  Ext: No edema.   CNS: CN 2-12 intact, power tone and sensation normal throughout.   Skin: Intact, no visible lesions or rashes.  Psych: Good eye contact, normal affect.  Memory intact, not anxious  or depressed appearing.     Impression & Recommendations:  Problem # 1:  ACUTE BRONCHITIS (ICD-466.0) Assessment Comment Only  The following medications were removed from the medication list:    Tessalon Perles 100 Mg Caps (Benzonatate) ..... One tab by mouth tid His updated medication list for this problem includes:    Septra Ds 800-160 Mg Tabs (Sulfamethoxazole-trimethoprim) .Marland Kitchen... Take 1 tablet by mouth two times a day    Tessalon Perles 100 Mg Caps  (Benzonatate) .Marland Kitchen... Take 1 capsule by mouth three times a day  Orders: CXR- 2view (CXR)  Problem # 2:  OTHER AND UNSPECIFIED HYPERLIPIDEMIA (ICD-272.4) Assessment: Comment Only  Labs Reviewed: SGOT: 19 (12/19/2008)   SGPT: 15 (12/19/2008)   HDL:41 (06/18/2009), 64 (12/19/2008)  LDL:122 (06/18/2009), 126 (12/19/2008)  Chol:187 (06/18/2009), 208 (12/19/2008)  Trig:119 (06/18/2009), 89 (12/19/2008)  Problem # 3:  NICOTINE ADDICTION (ICD-305.1) Assessment: Unchanged  Encouraged smoking cessation and discussed different methods for smoking cessation.   Problem # 4:  HYPERTENSION (ICD-401.9) Assessment: Improved  His updated medication list for this problem includes:    Norvasc 10 Mg Tabs (Amlodipine besylate) .Marland Kitchen... Take 1 tablet by mouth once a day  BP today: 122/82 Prior BP: 160/90 (03/12/2009)  Labs Reviewed: K+: 3.7 (12/19/2008) Creat: : 0.94 (12/19/2008)   Chol: 187 (06/18/2009)   HDL: 41 (06/18/2009)   LDL: 122 (06/18/2009)   TG: 119 (06/18/2009)  Complete Medication List: 1)  Aspirin 81 Mg Tbec (Aspirin) .... Take 1 tablet by mouth once a day 2)  Norvasc 10 Mg Tabs (Amlodipine besylate) .... Take 1 tablet by mouth once a day 3)  Septra Ds 800-160 Mg Tabs (Sulfamethoxazole-trimethoprim) .... Take 1 tablet by mouth two times a day 4)  Tessalon Perles 100 Mg Caps (Benzonatate) .... Take 1 capsule by mouth three times a day  Other Orders: T-TSH (09811-91478) Glucose, (CBG) (29562)  Patient Instructions: 1)  f/u as before  2)  you are being treated  for acute bronchitis, meds are sent to your pharmacy. 3)  TSH prior to visit, ICD-9:  today 4)  You absolutely need a colonscopy.You are losing weight and I do not  know why. 5)  You will get a CXR . Prescriptions: TESSALON PERLES 100 MG CAPS (BENZONATATE) Take 1 capsule by mouth three times a day  #30 x 0   Entered and Authorized by:   Syliva Overman MD   Signed by:   Syliva Overman MD on 07/17/2009   Method used:    Electronically to        [facility] [phone]* (retail)       [address]       [place], [place]  [address]       Ph: [phone] or [phone]       Fax: [phone]   RxID:   [id number] SEPTRA DS 800-160 MG TABS (SULFAMETHOXAZOLE-TRIMETHOPRIM) Take 1 tablet by mouth two times a day  #28 x 0   Entered and Authorized by:   Syliva Overman MD   Signed by:   Syliva Overman MD on 07/17/2009   Method used:   Electronically to        [facility] [phone]* (retail)       [address]       [place], [place]  [address]       Ph: [phone] or [phone]       Fax: [phone]  RxID:   [id number]   Laboratory Results   Blood Tests   Date/Time Received: 07/17/09 Date/Time Reported: 07/17/09  Glucose (random): 101 mg/dL   (Normal Range: 27-253)

## 2010-11-26 NOTE — Assessment & Plan Note (Signed)
Summary: office visit   Vital Signs:  Patient profile:   60 year old male Height:      74 inches (187.96 cm) Weight:      189 pounds (85.91 kg) BMI:     24.35 BSA:     2.12 O2 Sat:      98 % Temp:     97.5 degrees F (36.39 degrees C) oral Pulse rate:   78 / minute Pulse rhythm:   regular Resp:     16 per minute BP sitting:   160 / 90  (left arm) Cuff size:   regular  Vitals Entered By: Everitt Amber (Mar 12, 2009 11:23 AM) CC: Slight cold symptoms, chest congestion, clear phlegm Is Patient Diabetic? No Pain Assessment Patient in pain? no        CC:  Slight cold symptoms, chest congestion, and clear phlegm.  History of Present Illness: smoking 6 ciggs daily, wants to quit but does not want med. Patient reports doing well.  Denies any recent fever or chills.  Denies any appetite change or change in bowel movements. Patient denies depression, anxiety or insomnia. He has been off his bP meds for the last 3 days unfortunately, "forgot them when he went out of town" He keeps physically active and also exercises regularly.  Preventive Screening-Counseling & Management     Smoking Status: current     Smoking Cessation Counseling: yes     Packs/Day: 0.25  Current Medications (verified): 1)  Aspirin 81 Mg  Tbec (Aspirin) .... Take 1 Tablet By Mouth Once A Day 2)  Amlodipine Besylate 10 Mg Tabs (Amlodipine Besylate) .... Take 1 Tablet By Mouth Once A Day  Allergies (verified): 1)  ! Penicillin  Social History:    Packs/Day:  0.25  Review of Systems General:  Denies chills and fever. ENT:  Denies earache, hoarseness, nasal congestion, sinus pressure, and sore throat. CV:  Denies chest pain or discomfort, palpitations, and swelling of hands. Resp:  Denies cough and sputum productive. GI:  Denies abdominal pain, change in bowel habits, constipation, diarrhea, nausea, and vomiting; still has not had colonscopy, afraid. GU:  Denies nocturia, urinary frequency, and urinary  hesitancy. MS:  Denies joint pain and stiffness. Derm:  Denies itching, lesion(s), and rash. Psych:  Denies anxiety and depression.  Physical Exam  General:  alert, well-nourished, and well-hydrated.  HEENT: No facial asymmetry,  EOMI, No sinus tenderness, TM's Clear, oropharynx  pink and moist.   Chest: Clear to auscultation bilaterally.  CVS: S1, S2, No murmurs, No S3.   Abd: Soft, Nontender.  MS: Adequate ROM spine, hips, shoulders and knees.  Ext: No edema.   CNS: CN 2-12 intact, power tone and sensation normal throughout.   Skin: Intact, no visible lesions or rashes.  Psych: Good eye contact, normal affect.  Memory intact, not anxious or depressed appearing.     Impression & Recommendations:  Problem # 1:  NICOTINE ADDICTION (ICD-305.1) Assessment Unchanged  Encouraged smoking cessation and discussed different methods for smoking cessation.   Problem # 2:  HYPERTENSION (ICD-401.9) Assessment: Unchanged  The following medications were removed from the medication list:    Amlodipine Besylate 10 Mg Tabs (Amlodipine besylate) .Marland Kitchen... Take 1 tablet by mouth once a day His updated medication list for this problem includes:    Norvasc 10 Mg Tabs (Amlodipine besylate) .Marland Kitchen... Take 1 tablet by mouth once a day see hpi BP today: 160/90 Prior BP: 160/90 (12/19/2008)  Labs Reviewed: K+: 3.7 (12/19/2008) Creat: :  0.94 (12/19/2008)   Chol: 208 (12/19/2008)   HDL: 64 (12/19/2008)   LDL: 126 (12/19/2008)   TG: 89 (12/19/2008)  Problem # 3:  OTHER AND UNSPECIFIED HYPERLIPIDEMIA (ICD-272.4) Assessment: Comment Only  Labs Reviewed: SGOT: 19 (12/19/2008)   SGPT: 15 (12/19/2008)   HDL:64 (12/19/2008), 40 (04/12/2008)  LDL:126 (12/19/2008), 133 (04/12/2008)  Chol:208 (12/19/2008), 203 (04/12/2008)  Trig:89 (12/19/2008), 152 (04/12/2008), low fat diet discussed and encouraged  Complete Medication List: 1)  Aspirin 81 Mg Tbec (Aspirin) .... Take 1 tablet by mouth once a day 2)  Norvasc  10 Mg Tabs (Amlodipine besylate) .... Take 1 tablet by mouth once a day  Other Orders: T-Lipid Profile ([id number]) T-PSA ([id number])  Patient Instructions: 1)  cPE in 3 months. 2)  pLEASE TAKE bP meds EVERY day. 3)  Follow a low fat diet. 4)  Tobacco is very bad for your health and your loved ones! You Should stop smoking!. 5)  Stop Smoking Tips: Choose a Quit date. Cut down before the Quit date. decide what you will do as a substitute when you feel the urge to smoke(gum,toothpick,exercise). 6)  It is important that you exercise regularly at least 30 minutes 7 times a week. If you develop chest pain, have severe difficulty breathing, or feel very tired , stop exercising immediately and seek medical attention. 7)  Schedule a colonoscopy/sigmoidoscopy to help detect colon cancer. Prescriptions: NORVASC 10 MG TABS (AMLODIPINE BESYLATE) Take 1 tablet by mouth once a day  #90 x 1   Entered and Authorized by:   Syliva Overman MD   Signed by:   Syliva Overman MD on 03/12/2009   Method used:   Electronically to        [facility] [phone]* (retail)       [address]       [place], [place]  [address]       Ph: [phone] or [phone]       Fax: [phone]   RxID:   [id number]

## 2010-11-28 NOTE — Progress Notes (Signed)
Summary: REFILL ON RX  Phone Note Call from Patient   Summary of Call: PATIENT STATES THAT HE HAS 2 MORE PILL FOR HIS HIGH BLOOD PRESSURE MEDICTION NEED A REFILL ON AMLODIPINE 10 MG THERE ARE NO MORE REFILL ON BOTTLE PLEASE CALL IN REFILL FOR HIM AT [facility]  Initial call taken by: Eugenio Hoes,  October 10, 2010 1:10 PM    Prescriptions: AMLODIPINE BESYLATE 10 MG TABS (AMLODIPINE BESYLATE) Take 1 tablet by mouth once a day  #90 x 0   Entered by:   Adella Hare LPN   Authorized by:   Syliva Overman MD   Signed by:   Adella Hare LPN on 04/54/0981   Method used:   Electronically to        [facility] [phone]* (retail)       [address]       [place], [place]  [address]       Ph: [phone] or [phone]       Fax: [phone]   RxID:   [id number]

## 2010-12-24 ENCOUNTER — Encounter: Payer: Self-pay | Admitting: Family Medicine

## 2010-12-25 ENCOUNTER — Encounter: Payer: Self-pay | Admitting: Family Medicine

## 2010-12-25 ENCOUNTER — Ambulatory Visit (INDEPENDENT_AMBULATORY_CARE_PROVIDER_SITE_OTHER): Payer: BC Managed Care – PPO | Admitting: Family Medicine

## 2010-12-25 DIAGNOSIS — I10 Essential (primary) hypertension: Secondary | ICD-10-CM

## 2010-12-25 DIAGNOSIS — J209 Acute bronchitis, unspecified: Secondary | ICD-10-CM

## 2010-12-25 DIAGNOSIS — J449 Chronic obstructive pulmonary disease, unspecified: Secondary | ICD-10-CM

## 2010-12-25 DIAGNOSIS — J4489 Other specified chronic obstructive pulmonary disease: Secondary | ICD-10-CM

## 2010-12-26 ENCOUNTER — Ambulatory Visit (HOSPITAL_COMMUNITY): Payer: BC Managed Care – PPO | Attending: Family Medicine

## 2010-12-27 LAB — CONVERTED CEMR LAB
BUN: 14 mg/dL (ref 6–23)
Basophils Absolute: 0 10*3/uL (ref 0.0–0.1)
Basophils Relative: 0 % (ref 0–1)
CO2: 28 meq/L (ref 19–32)
Calcium: 9.4 mg/dL (ref 8.4–10.5)
Chloride: 105 meq/L (ref 96–112)
Cholesterol: 196 mg/dL (ref 0–200)
Creatinine, Ser: 0.89 mg/dL (ref 0.40–1.50)
Eosinophils Absolute: 0.3 10*3/uL (ref 0.0–0.7)
Eosinophils Relative: 6 % — ABNORMAL HIGH (ref 0–5)
Glucose, Bld: 88 mg/dL (ref 70–99)
HCT: 42.2 % (ref 39.0–52.0)
HDL: 38 mg/dL — ABNORMAL LOW (ref >39)
Hemoglobin: 14.3 g/dL (ref 13.0–17.0)
LDL Cholesterol: 125 mg/dL — ABNORMAL HIGH (ref 0–99)
Lymphocytes Relative: 56 % — ABNORMAL HIGH (ref 12–46)
Lymphs Abs: 2.8 10*3/uL (ref 0.7–4.0)
MCHC: 33.9 g/dL (ref 30.0–36.0)
MCV: 92.1 fL (ref 78.0–100.0)
Monocytes Absolute: 0.4 10*3/uL (ref 0.1–1.0)
Monocytes Relative: 9 % (ref 3–12)
Neutro Abs: 1.5 10*3/uL — ABNORMAL LOW (ref 1.7–7.7)
Neutrophils Relative %: 30 % — ABNORMAL LOW (ref 43–77)
PSA: 3.24 ng/mL (ref <=4.00)
Platelets: 300 10*3/uL (ref 150–400)
Potassium: 4.1 meq/L (ref 3.5–5.3)
RBC: 4.58 M/uL (ref 4.22–5.81)
RDW: 13.5 % (ref 11.5–15.5)
Sodium: 142 meq/L (ref 135–145)
TSH: 1.271 microintl units/mL (ref 0.350–4.500)
Total CHOL/HDL Ratio: 5.2
Triglycerides: 167 mg/dL — ABNORMAL HIGH (ref <150)
VLDL: 33 mg/dL (ref 0–40)
WBC: 5 10*3/uL (ref 4.0–10.5)

## 2010-12-30 ENCOUNTER — Ambulatory Visit (HOSPITAL_COMMUNITY)
Admission: RE | Admit: 2010-12-30 | Discharge: 2010-12-30 | Disposition: A | Discharge status: Home / Self Care | Payer: BC Managed Care – PPO | Source: Ambulatory Visit | Attending: Family Medicine | Admitting: Family Medicine

## 2010-12-30 ENCOUNTER — Other Ambulatory Visit: Payer: Self-pay | Admitting: Family Medicine

## 2010-12-30 ENCOUNTER — Encounter: Payer: Self-pay | Admitting: Family Medicine

## 2010-12-30 DIAGNOSIS — I10 Essential (primary) hypertension: Secondary | ICD-10-CM | POA: Insufficient documentation

## 2010-12-30 DIAGNOSIS — R918 Other nonspecific abnormal finding of lung field: Secondary | ICD-10-CM | POA: Insufficient documentation

## 2010-12-30 DIAGNOSIS — J40 Bronchitis, not specified as acute or chronic: Secondary | ICD-10-CM

## 2010-12-30 DIAGNOSIS — F17200 Nicotine dependence, unspecified, uncomplicated: Secondary | ICD-10-CM | POA: Insufficient documentation

## 2010-12-30 DIAGNOSIS — R0602 Shortness of breath: Secondary | ICD-10-CM | POA: Insufficient documentation

## 2011-01-02 NOTE — Letter (Signed)
Summary: Out of Work  [facility]  [address]   [place], [place] [address]   Phone: [phone]  Fax: [phone]    December 25, 2010   Employee:  EBERT FORRESTER Larios    To Whom It May Concern:   For Medical reasons, please excuse the above named employee from work for the following dates:  Start:   12/25/10  End:   12/28/10 to return with no restrictions   If you need additional information, please feel free to contact our office.         Sincerely,    Milus Mallick. Lodema Hong, MD

## 2011-01-02 NOTE — Letter (Signed)
Summary: 1st no show  1st no show   Imported By: Lind Guest 12/24/2010 13:48:50  _____________________________________________________________________  External Attachment:    Type:   Image     Comment:   External Document

## 2011-01-07 ENCOUNTER — Encounter: Payer: Self-pay | Admitting: Family Medicine

## 2011-01-07 NOTE — Letter (Signed)
Summary: FMLA PAPERS  FMLA PAPERS   Imported By: Lind Guest 12/30/2010 14:41:45  _____________________________________________________________________  External Attachment:    Type:   Image     Comment:   External Document

## 2011-01-07 NOTE — Assessment & Plan Note (Signed)
Summary: office visit   Vital Signs:  Patient profile:   60 year old male Height:      74 inches Weight:      182.50 pounds BMI:     23.52 O2 Sat:      91 % Pulse rate:   89 / minute Pulse rhythm:   regular Resp:     18 per minute BP sitting:   150 / 90  (left arm)  Vitals Entered By: Everitt Amber LPN (December 25, 2010 9:36 AM) CC: c/o having problems breathing, flares up because of a trigger every now and then    CC:  c/o having problems breathing and flares up because of a trigger every now and then .  History of Present Illness: P t was seen in the Ed 06/24/2010 with bronchitis. States he had problems on the job last night with shortness of breath and cough and was sent off the job, asked to see me with an FMLA form. Smiokes 3 or 4 ciggs /day, trying to quit. States he has had symptoms for 2 weeks gradually worsening, he has had cough, wheeze and shortness of breath with yellow sputum, no fever or chills.  Preventive Screening-Counseling & Management  Alcohol-Tobacco     Smoking Cessation Counseling: yes  Current Medications (verified): 1)  Aspirin 81 Mg  Tbec (Aspirin) .... Take 1 Tablet By Mouth Once A Day 2)  Amlodipine Besylate 10 Mg Tabs (Amlodipine Besylate) .... Take 1 Tablet By Mouth Once A Day 3)  Ventolin Hfa 108 (90 Base) Mcg/act Aers (Albuterol Sulfate) .... 2 Puffs By Mouth Every 4 Hrs As Needed  Allergies (verified): 1)  ! Penicillin  Social History: EMPLOYED Single Current Smoker  x 40 years Alcohol use-yes Drug use-no   Review of Systems      See HPI General:  Complains of fatigue and weakness. Eyes:  Denies discharge and red eye. ENT:  Denies hoarseness, nasal congestion, and sinus pressure. CV:  Complains of shortness of breath with exertion; denies chest pain or discomfort and palpitations. Resp:  Complains of cough, shortness of breath, and wheezing. GI:  Denies abdominal pain, constipation, diarrhea, nausea, and vomiting. GU:  Denies  dysuria and urinary frequency. Derm:  Denies itching and rash. Neuro:  Denies headaches, seizures, and sensation of room spinning. Psych:  Denies anxiety and depression. Endo:  Denies cold intolerance, excessive hunger, and excessive thirst. Heme:  Denies abnormal bruising, bleeding, and enlarge lymph nodes. Allergy:  Denies hives or rash and itching eyes.  Physical Exam  General:  Well-developed,well-nourished,inmild respiratory distresss; alert,appropriate and cooperative throughout examination HEENT: No facial asymmetry,  EOMI, No sinus tenderness, TM's Clear, oropharynx  pink and moist.   Chest: bilateral wheezes and crackles , poor air entry throughout CVS: S1, S2, No murmurs, No S3.   Abd: Soft, Nontender.  MS: Adequate ROM spine, hips, shoulders and knees.  Ext: No edema.   CNS: CN 2-12 intact, power tone and sensation normal throughout.   Skin: Intact, no visible lesions or rashes.  Psych: Good eye contact, normal affect.  Memory intact, not anxious or depressed appearing.    Impression & Recommendations:  Problem # 1:  COPD (ICD-496) Assessment Deteriorated  His updated medication list for this problem includes:    Ventolin Hfa 108 (90 Base) Mcg/act Aers (Albuterol sulfate) .Marland Kitchen... 2 puffs by mouth every 4 hrs as needed    Symbicort 80-4.5 Mcg/act Aero (Budesonide-formoterol fumarate) .Marland Kitchen... 2 puffs twice daily  Orders: Pulmonary Referral (Pulmonary)  Pulmonary Functions Reviewed: O2 sat: 91 (12/25/2010)     Problem # 2:  ACUTE BRONCHITIS (ICD-466.0) Assessment: Comment Only  The following medications were removed from the medication list:    Sulfamethoxazole-trimethoprim 400-80 Mg Tabs (Sulfamethoxazole-trimethoprim) .Marland Kitchen... Take 1 tablet by mouth two times a day His updated medication list for this problem includes:    Ventolin Hfa 108 (90 Base) Mcg/act Aers (Albuterol sulfate) .Marland Kitchen... 2 puffs by mouth every 4 hrs as needed    Tessalon Perles 100 Mg Caps  (Benzonatate) .Marland Kitchen... Take 1 capsule by mouth three times a day    Septra Ds 800-160 Mg Tabs (Sulfamethoxazole-trimethoprim) .Marland Kitchen... Take 1 tablet by mouth two times a day    Symbicort 80-4.5 Mcg/act Aero (Budesonide-formoterol fumarate) .Marland Kitchen... 2 puffs twice daily  Orders: CXR- 2view (CXR) Depo- Medrol 80mg  (J1040) Admin of Therapeutic Inj  intramuscular or subcutaneous (16109) Albuterol Sulfate Sol 1mg  unit dose (U0454) Ipratropium inhalation sol. unit dose (U9811) Nebulizer Tx (91478)  Problem # 3:  NICOTINE ADDICTION (ICD-305.1) Assessment: Improved  Encouraged smoking cessation and discussed different methods for smoking cessation.   Problem # 4:  HYPERTENSION (ICD-401.9) Assessment: Deteriorated  His updated medication list for this problem includes:    Amlodipine Besylate 10 Mg Tabs (Amlodipine besylate) .Marland Kitchen... Take 1 tablet by mouth once a day  Orders: T-Basic Metabolic Panel [phone])  BP today: 150/90 Prior BP: 130/80 (06/27/2010)  Labs Reviewed: K+: 3.7 (12/19/2008) Creat: : 0.94 (12/19/2008)   Chol: 187 (06/18/2009)   HDL: 41 (06/18/2009)   LDL: 122 (06/18/2009)   TG: 119 (06/18/2009)  Complete Medication List: 1)  Aspirin 81 Mg Tbec (Aspirin) .... Take 1 tablet by mouth once a day 2)  Amlodipine Besylate 10 Mg Tabs (Amlodipine besylate) .... Take 1 tablet by mouth once a day 3)  Ventolin Hfa 108 (90 Base) Mcg/act Aers (Albuterol sulfate) .... 2 puffs by mouth every 4 hrs as needed 4)  Prednisone (pak) 5 Mg Tabs (Prednisone) .... Use as directed 5)  Tessalon Perles 100 Mg Caps (Benzonatate) .... Take 1 capsule by mouth three times a day 6)  Septra Ds 800-160 Mg Tabs (Sulfamethoxazole-trimethoprim) .... Take 1 tablet by mouth two times a day 7)  Symbicort 80-4.5 Mcg/act Aero (Budesonide-formoterol fumarate) .... 2 puffs twice daily  Other Orders: T-CBC w/Diff ([id number]) T-Lipid Profile [phone]) T-PSA (02725-36644) T-TSH [phone])  Patient  Instructions: 1)  Please schedule a follow-up appointment in 3 months. 2)  Tobacco is very bad for your health and your loved ones! You Should stop smoking!. 3)  Stop Smoking Tips: Choose a Quit date. Cut down before the Quit date. decide what you will do as a substitute when you feel the urge to smoke(gum,toothpick,exercise). 4)  You are being treated forCOPD flare, and bronchitis 5)  You will get a breathing treatment today, also injection in the office and meds are also sent in for you. 6)  New inhaler to help your breathing forDAILY use pls. 7)  Work excuse fromyesterday to return March 3. 8)  Lung function tests and CXRneed to be done asap. 9)  Labs today Prescriptions: SYMBICORT 80-4.5 MCG/ACT AERO (BUDESONIDE-FORMOTEROL FUMARATE) 2 puffs twice daily  #1 x 3   Entered and Authorized by:   Syliva Overman MD   Signed by:   Syliva Overman MD on 12/25/2010   Method used:   Electronically to        [facility] [phone]* (retail)       [address]  [place], [place]  [address]       Ph: [phone] or [phone]       Fax: [phone]   RxID:   [id number] SEPTRA DS 800-160 MG TABS (SULFAMETHOXAZOLE-TRIMETHOPRIM) Take 1 tablet by mouth two times a day  #20 x 0   Entered and Authorized by:   Syliva Overman MD   Signed by:   Syliva Overman MD on 12/25/2010   Method used:   Electronically to        [facility] [phone]* (retail)       [address]       [place], [place]  [address]       Ph: [phone] or [phone]       Fax: [phone]   RxID:   [id number] TESSALON PERLES 100 MG CAPS (BENZONATATE) Take 1 capsule by mouth three times a day  #30 x 0   Entered and Authorized by:   Syliva Overman MD   Signed by:   Syliva Overman MD on 12/25/2010   Method used:   Electronically to        [facility] [phone]* (retail)       [address]       [place], [place]  [address]       Ph: [phone] or  [phone]       Fax: [phone]   RxID:   [id number] PREDNISONE (PAK) 5 MG TABS (PREDNISONE) Use as directed  #21 x 0   Entered and Authorized by:   Syliva Overman MD   Signed by:   Syliva Overman MD on 12/25/2010   Method used:   Electronically to        [facility] [phone]* (retail)       [address]       [place], [place]  [address]       Ph: [phone] or [phone]       Fax: [phone]   RxID:   [id number]    Medication Administration  Injection # 1:    Medication: Depo- Medrol 80mg     Diagnosis: ACUTE BRONCHITIS (ICD-466.0)    Route: IM    Site: RUOQ gluteus    Exp Date: 11/12    Lot #: [id number]    Mfr: Pharmacia    Patient tolerated injection without complications    Given by: Adella Hare LPN (December 25, 2010 11:34 AM)  Medication # 1:    Medication: Albuterol Sulfate Sol 1mg  unit dose    Diagnosis: ACUTE BRONCHITIS (ICD-466.0)    Dose: 2.5mg     Route: inhaled    Exp Date: 08/12    Lot #: [id number]    Mfr: nephron pharm    Patient tolerated medication without complications    Given by: Adella Hare LPN (December 25, 2010 11:35 AM)  Medication # 2:    Medication: Ipratropium inhalation sol. unit dose    Diagnosis: ACUTE BRONCHITIS (ICD-466.0)    Dose: 0.5mg     Route: inhaled    Exp Date: 06/13    Lot #: [id number]    Mfr: nephron pharm    Patient tolerated medication without complications    Given by: Adella Hare LPN (December 25, 2010 11:37 AM)  Orders Added: 1)  Est. Patient Level IV [73419] 2)  CXR- 2view [CXR] 3)  T-Basic Metabolic Panel [37902-40973] 4)  T-CBC w/Diff [60454-09811] 5)  T-Lipid Profile [80061-22930] 6)  T-PSA [91478-29562] 7)  T-TSH [13086-57846] 8)  Depo- Medrol 80mg  [J1040] 9)  Admin of Therapeutic Inj  intramuscular or subcutaneous [96372] 10)  Albuterol Sulfate Sol 1mg  unit dose [J7613] 11)  Ipratropium inhalation sol. unit dose [J7644] 12)  Nebulizer Tx [94640] 13)  Pulmonary Referral  [Pulmonary]   symbicort 160/4.5 sample given 3000404 C00 04/13   Medication Administration  Injection # 1:    Medication: Depo- Medrol 80mg     Diagnosis: ACUTE BRONCHITIS (ICD-466.0)    Route: IM    Site: RUOQ gluteus    Exp Date: 11/12    Lot #: [id number]    Mfr: Pharmacia    Patient tolerated injection without complications    Given by: Adella Hare LPN (December 25, 2010 11:34 AM)  Medication # 1:    Medication: Albuterol Sulfate Sol 1mg  unit dose    Diagnosis: ACUTE BRONCHITIS (ICD-466.0)    Dose: 2.5mg     Route: inhaled    Exp Date: 08/12    Lot #: [id number]    Mfr: nephron pharm    Patient tolerated medication without complications    Given by: Adella Hare LPN (December 25, 2010 11:35 AM)  Medication # 2:    Medication: Ipratropium inhalation sol. unit dose    Diagnosis: ACUTE BRONCHITIS (ICD-466.0)    Dose: 0.5mg     Route: inhaled    Exp Date: 06/13    Lot #: [id number]    Mfr: nephron pharm    Patient tolerated medication without complications    Given by: Adella Hare LPN (December 25, 2010 11:37 AM)  Orders Added: 1)  Est. Patient Level IV [40102] 2)  CXR- 2view [CXR] 3)  T-Basic Metabolic Panel [80048-22910] 4)  T-CBC w/Diff [72536-64403] 5)  T-Lipid Profile [80061-22930] 6)  T-PSA [47425-95638] 7)  T-TSH [75643-32951] 8)  Depo- Medrol 80mg  [J1040] 9)  Admin of Therapeutic Inj  intramuscular or subcutaneous [96372] 10)  Albuterol Sulfate Sol 1mg  unit dose [J7613] 11)  Ipratropium inhalation sol. unit dose [J7644] 12)  Nebulizer Tx [88416] 13)  Pulmonary Referral [Pulmonary]

## 2011-01-10 ENCOUNTER — Encounter: Payer: Self-pay | Admitting: Family Medicine

## 2011-01-10 LAB — POCT CARDIAC MARKERS
CKMB, poc: 2 ng/mL (ref 1.0–8.0)
Myoglobin, poc: 87.3 ng/mL (ref 12–200)
Troponin i, poc: <0.05 ng/mL (ref 0.00–0.09)

## 2011-01-10 LAB — CBC
HCT: 45.8 % (ref 39.0–52.0)
Hemoglobin: 15.3 g/dL (ref 13.0–17.0)
MCH: 31.3 pg (ref 26.0–34.0)
MCHC: 33.4 g/dL (ref 30.0–36.0)
MCV: 93.6 fL (ref 78.0–100.0)
Platelets: 328 10*3/uL (ref 150–400)
RBC: 4.89 MIL/uL (ref 4.22–5.81)
RDW: 14.9 % (ref 11.5–15.5)
WBC: 5.7 10*3/uL (ref 4.0–10.5)

## 2011-01-10 LAB — DIFFERENTIAL
Basophils Absolute: 0 10*3/uL (ref 0.0–0.1)
Basophils Relative: 1 % (ref 0–1)
Eosinophils Absolute: 0.4 10*3/uL (ref 0.0–0.7)
Eosinophils Relative: 7 % — ABNORMAL HIGH (ref 0–5)
Lymphocytes Relative: 60 % — ABNORMAL HIGH (ref 12–46)
Lymphs Abs: 3.4 10*3/uL (ref 0.7–4.0)
Monocytes Absolute: 0.4 10*3/uL (ref 0.1–1.0)
Monocytes Relative: 7 % (ref 3–12)
Neutro Abs: 1.4 10*3/uL — ABNORMAL LOW (ref 1.7–7.7)
Neutrophils Relative %: 25 % — ABNORMAL LOW (ref 43–77)

## 2011-01-10 LAB — BASIC METABOLIC PANEL
BUN: 8 mg/dL (ref 6–23)
CO2: 26 mEq/L (ref 19–32)
Calcium: 9.1 mg/dL (ref 8.4–10.5)
Chloride: 100 mEq/L (ref 96–112)
Creatinine, Ser: 0.88 mg/dL (ref 0.4–1.5)
GFR calc Af Amer: >60 mL/min (ref >60)
GFR calc non Af Amer: >60 mL/min (ref >60)
Glucose, Bld: 103 mg/dL — ABNORMAL HIGH (ref 70–99)
Potassium: 3.8 mEq/L (ref 3.5–5.1)
Sodium: 134 mEq/L — ABNORMAL LOW (ref 135–145)

## 2011-01-10 LAB — BRAIN NATRIURETIC PEPTIDE: Pro B Natriuretic peptide (BNP): <30 pg/mL (ref 0.0–100.0)

## 2011-01-14 NOTE — Letter (Signed)
Summary: Historic Patient File  Historic Patient File   Imported By: Lind Guest 01/10/2011 08:06:08  _____________________________________________________________________  External Attachment:    Type:   Image     Comment:   External Document

## 2011-01-14 NOTE — Letter (Signed)
Summary: Letter  Letter   Imported By: Lind Guest 01/10/2011 08:06:26  _____________________________________________________________________  External Attachment:    Type:   Image     Comment:   External Document

## 2011-01-29 ENCOUNTER — Telehealth: Payer: Self-pay | Admitting: Family Medicine

## 2011-01-30 NOTE — Telephone Encounter (Signed)
msg left for Dillon [name] to call me pls get me to the phone when she does

## 2011-03-25 ENCOUNTER — Encounter: Payer: Self-pay | Admitting: Family Medicine

## 2011-03-26 ENCOUNTER — Encounter: Payer: Self-pay | Admitting: Family Medicine

## 2011-03-26 ENCOUNTER — Ambulatory Visit: Payer: BC Managed Care – PPO | Admitting: Family Medicine

## 2011-03-26 ENCOUNTER — Ambulatory Visit (INDEPENDENT_AMBULATORY_CARE_PROVIDER_SITE_OTHER): Payer: BC Managed Care – PPO | Admitting: Family Medicine

## 2011-03-26 VITALS — BP 132/80 | HR 76 | Resp 16 | Ht 74.75 in | Wt 175.1 lb

## 2011-03-26 DIAGNOSIS — Z1211 Encounter for screening for malignant neoplasm of colon: Secondary | ICD-10-CM

## 2011-03-26 DIAGNOSIS — Z23 Encounter for immunization: Secondary | ICD-10-CM

## 2011-03-26 DIAGNOSIS — I10 Essential (primary) hypertension: Secondary | ICD-10-CM

## 2011-03-26 DIAGNOSIS — F17200 Nicotine dependence, unspecified, uncomplicated: Secondary | ICD-10-CM

## 2011-03-26 DIAGNOSIS — J449 Chronic obstructive pulmonary disease, unspecified: Secondary | ICD-10-CM

## 2011-03-26 DIAGNOSIS — E785 Hyperlipidemia, unspecified: Secondary | ICD-10-CM

## 2011-03-26 LAB — POC HEMOCCULT BLD/STL (OFFICE/1-CARD/DIAGNOSTIC): Fecal Occult Blood, POC: NEGATIVE

## 2011-03-26 MED ORDER — AMLODIPINE BESYLATE 10 MG PO TABS: 10.0000 mg | ORAL_TABLET | Freq: Every day | ORAL | Status: DC

## 2011-03-26 NOTE — Progress Notes (Signed)
  Subjective:    Patient ID: Dillon [name], male    DOB: 1950-12-25, 60 y.o.   MRN: [id number]  HPI 2 week h/o painless swelling on dorsum of left hand, no trauma directly, uses the hand a lot on the job. Will call for ortho referral if worsens. Down to 4 ciggs/day, wants to quit by end of June. colonscopy past due, not comiting to scheduling , denies any change in bM , there is no family history of the disease. The PT is here for follow up and re-evaluation of chronic medical conditions, medication management and review of recent lab and radiology data.  Preventive health is updated, specifically  Cancer screening,  and Immunization.   Questions or concerns regarding consultations or procedures which the PT has had in the interim are  addressed. The PT denies any adverse reactions to current medications since the last visit.        Review of Systems Denies recent fever or chills. Denies sinus pressure, nasal congestion, ear pain or sore throat. Denies chest congestion, productive cough or wheezing. Denies chest pains, palpitations, paroxysmal nocturnal dyspnea, orthopnea and leg swelling Denies abdominal pain, nausea, vomiting,diarrhea or constipation.  Denies rectal bleeding or change in bowel movement. Denies dysuria, frequency, hesitancy or incontinence.  Denies headaches, seizure, numbness, or tingling. Denies depression, anxiety or insomnia. Denies skin break down or rash.        Objective:   Physical Exam Patient alert and oriented and in no Cardiopulmonary distress.  HEENT: No facial asymmetry, EOMI, no sinus tenderness, TM's clear, Oropharynx pink and moist.  Neck supple no adenopathy.  Chest: Clear to auscultation bilaterally,.decreased air entry throughout  CVS: S1, S2 no murmurs, no S3.  ABD: Soft non tender. Bowel sounds normal.  Ext: No edema  MS: Adequate ROM spine, shoulders, hips and knees.mild swelling on dorsum of hand  Skin: Intact, no ulcerations  or rash noted.  Psych: Good eye contact, normal affect. Memory intact not anxious or depressed appearing.  CNS: CN 2-12 intact, power, tone and sensation normal throughout.        Assessment & Plan:

## 2011-03-26 NOTE — Patient Instructions (Signed)
F/U in 5.5  Month  Please think about quitting smoking.  This is very important for your health.  Consider setting a quit date, then cutting back or switching brands to prepare to stop.  Also think of the money you will save every day by not smoking.  Quick Tips to Quit Smoking: Fix a date i.e. keep a date in mind from when you would not touch a tobacco product to smoke  Keep yourself busy and block your mind with work loads or reading books or watching movies in malls where smoking is not allowed  Vanish off the things which reminds you about smoking for example match box, or your favorite lighter, or the pipe you used for smoking, or your favorite jeans and shirt with which you used to enjoy smoking, or the club where you used to do smoking  Try to avoid certain people places and incidences where and with whom smoking is a common factor to add on  Praise yourself with some token gifts from the money you saved by stopping smoking  Anti Smoking teams are there to help you. Join their programs  Anti-smoking Gums are there in many medical shops. Try them to quit smoking   Side-effects of Smoking: Disease caused by smoking cigarettes are emphysema, bronchitis, heart failures  Premature death  Cancer is the major side effect of smoking  Heart attacks and strokes are the quick effects of smoking causing sudden death  Some smokers lives end up with limbs amputated  Breathing problem or fast breathing is another side effect of smoking  Due to more intakes of smokes, carbon mono-oxide goes into your brain and other muscles of the body which leads to swelling of the veins and blockage to the air passage to lungs  Carbon monoxide blocks blood vessels which leads to blockage in the flow of blood to different major body organs like heart lungs and thus leads to attacks and deaths  During pregnancy smoking is very harmful and leads to premature birth of the infant, spontaneous abortions, low weight of the  infant during birth  Fat depositions to narrow and blocked blood vessels causing heart attacks  In many cases cigarette smoking caused infertility in men    pls cut back on red meat, butter and cheese.  You need a colonscopy.  Call if swelling on left hand worsens

## 2011-04-06 ENCOUNTER — Encounter: Payer: Self-pay | Admitting: Family Medicine

## 2011-04-06 NOTE — Assessment & Plan Note (Signed)
Improved, wants to quit,hoping to do so in the next several months

## 2011-04-06 NOTE — Assessment & Plan Note (Signed)
Deteriorated, pt counseled to quit smoking

## 2011-04-06 NOTE — Assessment & Plan Note (Signed)
Controlled, no change in medication  

## 2011-04-06 NOTE — Assessment & Plan Note (Signed)
Low fat diet discussed and encouraged 

## 2011-09-15 ENCOUNTER — Encounter: Payer: Self-pay | Admitting: Family Medicine

## 2011-09-16 ENCOUNTER — Encounter: Payer: Self-pay | Admitting: Family Medicine

## 2011-09-16 ENCOUNTER — Ambulatory Visit: Payer: BC Managed Care – PPO | Admitting: Family Medicine

## 2011-09-16 ENCOUNTER — Ambulatory Visit (INDEPENDENT_AMBULATORY_CARE_PROVIDER_SITE_OTHER): Payer: BC Managed Care – PPO | Admitting: Family Medicine

## 2011-09-16 VITALS — BP 130/70 | HR 70 | Resp 16 | Ht 74.75 in | Wt 179.1 lb

## 2011-09-16 DIAGNOSIS — F17200 Nicotine dependence, unspecified, uncomplicated: Secondary | ICD-10-CM

## 2011-09-16 DIAGNOSIS — J449 Chronic obstructive pulmonary disease, unspecified: Secondary | ICD-10-CM

## 2011-09-16 DIAGNOSIS — I10 Essential (primary) hypertension: Secondary | ICD-10-CM

## 2011-09-16 MED ORDER — AMLODIPINE BESYLATE 10 MG PO TABS: 10.0000 mg | ORAL_TABLET | Freq: Every day | ORAL | Status: DC

## 2011-09-16 NOTE — Assessment & Plan Note (Signed)
Unchanged  Counseled to quit

## 2011-09-16 NOTE — Assessment & Plan Note (Signed)
Controlled, no change in medication  

## 2011-09-16 NOTE — Patient Instructions (Addendum)
CPE end May.  You need to stop smoking, start 3 per day, then cut back every 10 to 14 days.  Remember you need a  colonscopy  Blood pressure is good   You really do need the flu and pneumonia vaccines  Fasting labs due mid May

## 2011-09-16 NOTE — Progress Notes (Signed)
  Subjective:    Patient ID: Dillon [name], male    DOB: July 04, 1951, 60 y.o.   MRN: [id number]  HPI The PT is here for follow up and re-evaluation of chronic medical conditions, medication management and review of any available recent lab and radiology data.  Preventive health is updated, specifically  Cancer screening and Immunization.   Questions or concerns regarding consultations or procedures which the PT has had in the interim are  addressed. The PT denies any adverse reactions to current medications since the last visit.  There are no new concerns. Still smokes approx 4 to 5 cigaretes daily recognizes the need to quit, hopefully will stop by year end. Has put off the colonoscopy and refuses fl;u and pneumonia vaccines There are no specific complaints       Review of Systems See HPI Denies recent fever or chills. Denies sinus pressure, nasal congestion, ear pain or sore throat. Denies chest congestion, productive cough or wheezing. Denies chest pains, palpitations and leg swelling Denies abdominal pain, nausea, vomiting,diarrhea or constipation.   Denies dysuria, frequency, hesitancy or incontinence. Denies joint pain, swelling and limitation in mobility. Denies headaches, seizures, numbness, or tingling. Denies depression, anxiety or insomnia. Denies skin break down or rash.        Objective:   Physical Exam   Patient alert and oriented and in no cardiopulmonary distress.  HEENT: No facial asymmetry, EOMI, no sinus tenderness,  oropharynx pink and moist.  Neck supple no adenopathy.  Chest: Clear to auscultation bilaterally.Decreased air entry throughout  CVS: S1, S2 no murmurs, no S3.  ABD: Soft non tender. Bowel sounds normal.  Ext: No edema  MS: Adequate ROM spine, shoulders, hips and knees.  Skin: Intact, no ulcerations or rash noted.  Psych: Good eye contact, normal affect. Memory intact not anxious or depressed appearing.  CNS: CN 2-12 intact, power,  tone and sensation normal throughout.      Assessment & Plan:

## 2011-09-16 NOTE — Assessment & Plan Note (Signed)
Deteriorated, needs to quit smoking

## 2011-10-14 ENCOUNTER — Ambulatory Visit (INDEPENDENT_AMBULATORY_CARE_PROVIDER_SITE_OTHER): Payer: BC Managed Care – PPO | Admitting: Family Medicine

## 2011-10-14 ENCOUNTER — Encounter: Payer: Self-pay | Admitting: Family Medicine

## 2011-10-14 VITALS — BP 150/94 | HR 88 | Resp 16 | Ht 74.75 in | Wt 180.1 lb

## 2011-10-14 DIAGNOSIS — I10 Essential (primary) hypertension: Secondary | ICD-10-CM

## 2011-10-14 DIAGNOSIS — J449 Chronic obstructive pulmonary disease, unspecified: Secondary | ICD-10-CM

## 2011-10-14 DIAGNOSIS — F17200 Nicotine dependence, unspecified, uncomplicated: Secondary | ICD-10-CM

## 2011-10-14 MED ORDER — IPRATROPIUM BROMIDE 0.02 % IN SOLN
0.5000 mg | Freq: Once | RESPIRATORY_TRACT | Status: AC
  Administered 2011-10-14: 0.5 mg via RESPIRATORY_TRACT

## 2011-10-14 MED ORDER — METHYLPREDNISOLONE ACETATE PF 80 MG/ML IJ SUSP
80.0000 mg | Freq: Once | INTRAMUSCULAR | Status: AC
  Administered 2011-10-14: 80 mg via INTRAMUSCULAR

## 2011-10-14 MED ORDER — ALBUTEROL SULFATE HFA 108 (90 BASE) MCG/ACT IN AERS: 2.0000 | INHALATION_SPRAY | Freq: Four times a day (QID) | RESPIRATORY_TRACT | Status: DC | PRN

## 2011-10-14 MED ORDER — ALBUTEROL SULFATE (2.5 MG/3ML) 0.083% IN NEBU
2.5000 mg | INHALATION_SOLUTION | Freq: Once | RESPIRATORY_TRACT | Status: AC
  Administered 2011-10-14: 2.5 mg via RESPIRATORY_TRACT

## 2011-10-14 MED ORDER — PREDNISONE (PAK) 5 MG PO TABS: 5.0000 mg | ORAL_TABLET | ORAL | Status: DC

## 2011-10-14 NOTE — Assessment & Plan Note (Signed)
Acute flare 

## 2011-10-14 NOTE — Progress Notes (Signed)
Subjective:     Patient ID: Dillon [name], male   DOB: 09/20/1951, 60 y.o.   MRN: [id number]  HPI 1 day h/o increased wheezing and sOB started early this morning, no trigger noted.no fever, no sinus pressure , excellent response to neb treatment in office Review of Systems See HPI Denies recent fever or chills. Denies sinus pressure, nasal congestion, ear pain or sore throat. Denies chest congestion, productive cough  Denies chest pains, palpitations and leg swelling Denies abdominal pain, nausea, vomiting,diarrhea or constipation.   Denies dysuria, frequency, hesitancy or incontinence. Denies joint pain, swelling and limitation in mobility. Denies headaches, seizures, numbness, or tingling. Denies depression, anxiety or insomnia. Denies skin break down or rash.        Objective:   Physical Exam      Patient alert and oriented and   in mild  cardiopulmonary distress.  HEENT: No facial asymmetry, EOMI, no sinus tenderness,  oropharynx pink and moist.  Neck supple no adenopathy.  Chest: decreased air entry bilateral wheezes, no crackles, improved after neb treatment  CVS: S1, S2 no murmurs, no S3.  ABD: Soft non tender. Bowel sounds normal.  Ext: No edema  MS: Adequate ROM spine, shoulders, hips and knees.  Skin: Intact, no ulcerations or rash noted.  Psych: Good eye contact, normal affect. Memory intact not anxious or depressed appearing.  CNS: CN 2-12 intact, power, tone and sensation normal throughout.  Assessment:        Plan:

## 2011-10-14 NOTE — Patient Instructions (Signed)
F/u as before.  You have responded well to neb treatment, you have been given a depo medrol injection in the office and prednisone and an inhaler is sent to your pharmacy  You need to stop smoking

## 2011-10-14 NOTE — Assessment & Plan Note (Signed)
Counseled to quit 

## 2011-10-15 NOTE — Assessment & Plan Note (Signed)
Uncontrolled at this visit, will follow up and if still elevated medication will be adjusted

## 2012-04-05 ENCOUNTER — Other Ambulatory Visit: Payer: Self-pay

## 2012-04-05 MED ORDER — AMLODIPINE BESYLATE 10 MG PO TABS: 10.0000 mg | ORAL_TABLET | Freq: Every day | ORAL | Status: DC

## 2012-08-23 ENCOUNTER — Other Ambulatory Visit: Payer: Self-pay | Admitting: Family Medicine

## 2012-10-17 ENCOUNTER — Encounter (HOSPITAL_COMMUNITY): Payer: Self-pay | Admitting: *Deleted

## 2012-10-17 ENCOUNTER — Emergency Department (HOSPITAL_COMMUNITY)
Admission: EM | Admit: 2012-10-17 | Discharge: 2012-10-17 | Disposition: A | Discharge status: Home / Self Care | Payer: BC Managed Care – PPO | Attending: Emergency Medicine | Admitting: Emergency Medicine

## 2012-10-17 DIAGNOSIS — R05 Cough: Secondary | ICD-10-CM | POA: Insufficient documentation

## 2012-10-17 DIAGNOSIS — R062 Wheezing: Secondary | ICD-10-CM | POA: Insufficient documentation

## 2012-10-17 DIAGNOSIS — T59891A Toxic effect of other specified gases, fumes and vapors, accidental (unintentional), initial encounter: Secondary | ICD-10-CM | POA: Insufficient documentation

## 2012-10-17 DIAGNOSIS — J45909 Unspecified asthma, uncomplicated: Secondary | ICD-10-CM | POA: Insufficient documentation

## 2012-10-17 DIAGNOSIS — Y93E5 Activity, floor mopping and cleaning: Secondary | ICD-10-CM | POA: Insufficient documentation

## 2012-10-17 DIAGNOSIS — T5991XA Toxic effect of unspecified gases, fumes and vapors, accidental (unintentional), initial encounter: Secondary | ICD-10-CM | POA: Insufficient documentation

## 2012-10-17 DIAGNOSIS — I10 Essential (primary) hypertension: Secondary | ICD-10-CM | POA: Insufficient documentation

## 2012-10-17 DIAGNOSIS — Z7982 Long term (current) use of aspirin: Secondary | ICD-10-CM | POA: Insufficient documentation

## 2012-10-17 DIAGNOSIS — Z79899 Other long term (current) drug therapy: Secondary | ICD-10-CM | POA: Insufficient documentation

## 2012-10-17 DIAGNOSIS — R059 Cough, unspecified: Secondary | ICD-10-CM | POA: Insufficient documentation

## 2012-10-17 DIAGNOSIS — J9801 Acute bronchospasm: Secondary | ICD-10-CM

## 2012-10-17 DIAGNOSIS — R0789 Other chest pain: Secondary | ICD-10-CM | POA: Insufficient documentation

## 2012-10-17 DIAGNOSIS — Y92009 Unspecified place in unspecified non-institutional (private) residence as the place of occurrence of the external cause: Secondary | ICD-10-CM | POA: Insufficient documentation

## 2012-10-17 DIAGNOSIS — F17200 Nicotine dependence, unspecified, uncomplicated: Secondary | ICD-10-CM | POA: Insufficient documentation

## 2012-10-17 DIAGNOSIS — IMO0002 Reserved for concepts with insufficient information to code with codable children: Secondary | ICD-10-CM | POA: Insufficient documentation

## 2012-10-17 HISTORY — DX: Unspecified asthma, uncomplicated: J45.909

## 2012-10-17 MED ORDER — PREDNISONE 10 MG PO TABS: 20.0000 mg | ORAL_TABLET | Freq: Every day | ORAL | Status: DC

## 2012-10-17 MED ORDER — ALBUTEROL SULFATE HFA 108 (90 BASE) MCG/ACT IN AERS: 1.0000 | INHALATION_SPRAY | Freq: Four times a day (QID) | RESPIRATORY_TRACT | Status: DC | PRN

## 2012-10-17 MED ORDER — PREDNISONE 50 MG PO TABS
60.0000 mg | ORAL_TABLET | Freq: Once | ORAL | Status: AC
  Administered 2012-10-17: 60 mg via ORAL
  Filled 2012-10-17: qty 1

## 2012-10-17 MED ORDER — ALBUTEROL SULFATE (5 MG/ML) 0.5% IN NEBU
INHALATION_SOLUTION | RESPIRATORY_TRACT | Status: AC
  Administered 2012-10-17: 5 mg
  Filled 2012-10-17: qty 1

## 2012-10-17 MED ORDER — IPRATROPIUM BROMIDE 0.02 % IN SOLN: 0.5000 mg | Freq: Once | RESPIRATORY_TRACT | Status: DC

## 2012-10-17 MED ORDER — IPRATROPIUM BROMIDE 0.02 % IN SOLN
RESPIRATORY_TRACT | Status: AC
  Administered 2012-10-17: 0.5 mg
  Filled 2012-10-17: qty 2.5

## 2012-10-17 MED ORDER — ALBUTEROL SULFATE (5 MG/ML) 0.5% IN NEBU
2.5000 mg | INHALATION_SOLUTION | Freq: Once | RESPIRATORY_TRACT | Status: AC
  Administered 2012-10-17: 5 mg via RESPIRATORY_TRACT
  Filled 2012-10-17: qty 1

## 2012-10-17 MED ORDER — IPRATROPIUM BROMIDE 0.02 % IN SOLN
0.5000 mg | Freq: Once | RESPIRATORY_TRACT | Status: AC
  Administered 2012-10-17: 0.5 mg via RESPIRATORY_TRACT
  Filled 2012-10-17: qty 2.5

## 2012-10-17 MED ORDER — ALBUTEROL SULFATE (5 MG/ML) 0.5% IN NEBU: 5.0000 mg | INHALATION_SOLUTION | Freq: Once | RESPIRATORY_TRACT | Status: DC

## 2012-10-17 NOTE — ED Notes (Signed)
Respiratory in with pt

## 2012-10-17 NOTE — ED Notes (Signed)
Pt states he was cleaning with bleach and is now sob.

## 2012-10-17 NOTE — ED Notes (Signed)
Pt resting quietly.

## 2012-10-17 NOTE — ED Notes (Signed)
Pt told respiratory therapist he felt 100% better.

## 2012-10-23 NOTE — ED Provider Notes (Signed)
History     CSN: [id number]  Arrival date & time 10/17/12  0401   First MD Initiated Contact with Patient 10/17/12 0500      Chief Complaint  Patient presents with  . Shortness of Breath    (Consider location/radiation/quality/duration/timing/severity/associated sxs/prior treatment) HPI  Dillon [name] is a 61 y.o. male who presents to the Emergency Department complaining of shortness of breath and chest tightness after cleaning his bathroom with bleach. He developed wheezing and states his chest feels like it is burning. Denies fever, chills, nausea. Has been cough seen exposure to bleach and now has a sore throat.Has taken no medicines.  PCP Dr. Lodema Hong  Past Medical History  Diagnosis Date  . Nicotine addiction   . Hypertension   . Asthma     Past Surgical History  Procedure Date  . Knee arthroscopy     both knees     Family History  Problem Relation Age of Onset  . Heart attack Mother   . Diabetes Mother   . Hypertension Mother   . Hypertension Sister   . Hypertension Brother     History  Substance Use Topics  . Smoking status: Current Every Day Smoker -- 0.5 packs/day    Types: Cigarettes  . Smokeless tobacco: Not on file  . Alcohol Use: No      Review of Systems  Constitutional: Negative for fever.       10 Systems reviewed and are negative for acute change except as noted in the HPI.  HENT: Negative for congestion.   Eyes: Negative for discharge and redness.  Respiratory: Positive for cough, chest tightness, shortness of breath and wheezing.        Chest burning  Cardiovascular: Negative for chest pain.  Gastrointestinal: Negative for vomiting and abdominal pain.  Musculoskeletal: Negative for back pain.  Skin: Negative for rash.  Neurological: Negative for syncope, numbness and headaches.  Psychiatric/Behavioral:       No behavior change.    Allergies  Penicillins  Home Medications   Current Outpatient Rx  Name  Route  Sig  Dispense   Refill  . ALBUTEROL SULFATE HFA 108 (90 BASE) MCG/ACT IN AERS   Inhalation   Inhale 2 puffs into the lungs every 6 (six) hours as needed for wheezing.   18 g   1   . ALBUTEROL SULFATE HFA 108 (90 BASE) MCG/ACT IN AERS   Inhalation   Inhale 1-2 puffs into the lungs every 6 (six) hours as needed for wheezing.   1 Inhaler   0   . AMLODIPINE BESYLATE 10 MG PO TABS      TAKE 1 TABLET (10 MG TOTAL) BY MOUTH DAILY.   30 tablet   2   . ASPIRIN 81 MG PO TBEC   Oral   Take 81 mg by mouth daily. Take one tablet by mouth once a day          . PREDNISONE 10 MG PO TABS   Oral   Take 2 tablets (20 mg total) by mouth daily.   10 tablet   0     BP 129/79  Pulse 73  Temp 98.2 F (36.8 C) (Oral)  Resp 20  Ht 6\' 2"  (1.88 m)  Wt 185 lb (83.915 kg)  BMI 23.75 kg/m2  SpO2 92%  Physical Exam  Nursing note and vitals reviewed. Constitutional: He appears well-developed and well-nourished.       Awake, alert, nontoxic appearance.  HENT:  Head: Atraumatic.  Eyes: Right eye exhibits no discharge. Left eye exhibits no discharge.  Neck: Neck supple.  Cardiovascular: Normal rate.   Pulmonary/Chest: Effort normal. He has wheezes. He exhibits no tenderness.  Abdominal: Soft. There is no tenderness. There is no rebound.  Musculoskeletal: He exhibits no tenderness.       Baseline ROM, no obvious new focal weakness.  Neurological:       Mental status and motor strength appears baseline for patient and situation.  Skin: No rash noted.  Psychiatric: He has a normal mood and affect.    ED Course  Procedures (including critical care time)    1. Bronchospasm       MDM  Patient with chemical bronchospasm from bleach inhaled while cleaning his bathroom. Given back to back nebulizer treatments with resolution of wheezing, burning sensation. Initiated steroid therapy. Pt stable in ED with no significant deterioration in condition.The patient appears reasonably screened and/or stabilized  for discharge and I doubt any other medical condition or other [facility] requiring further screening, evaluation, or treatment in the ED at this time prior to discharge.  MDM Reviewed: nursing note and vitals          Nicoletta Dress. Colon Branch, MD 10/23/12 [phone]

## 2013-01-07 ENCOUNTER — Other Ambulatory Visit: Payer: Self-pay | Admitting: Family Medicine

## 2013-01-17 ENCOUNTER — Ambulatory Visit (INDEPENDENT_AMBULATORY_CARE_PROVIDER_SITE_OTHER): Payer: BC Managed Care – PPO | Admitting: Family Medicine

## 2013-01-17 ENCOUNTER — Encounter: Payer: Self-pay | Admitting: Family Medicine

## 2013-01-17 VITALS — BP 130/92 | HR 82 | Resp 16 | Wt 178.0 lb

## 2013-01-17 DIAGNOSIS — J449 Chronic obstructive pulmonary disease, unspecified: Secondary | ICD-10-CM

## 2013-01-17 DIAGNOSIS — R5383 Other fatigue: Secondary | ICD-10-CM

## 2013-01-17 DIAGNOSIS — E785 Hyperlipidemia, unspecified: Secondary | ICD-10-CM

## 2013-01-17 DIAGNOSIS — I10 Essential (primary) hypertension: Secondary | ICD-10-CM

## 2013-01-17 DIAGNOSIS — Z125 Encounter for screening for malignant neoplasm of prostate: Secondary | ICD-10-CM

## 2013-01-17 DIAGNOSIS — R5381 Other malaise: Secondary | ICD-10-CM

## 2013-01-17 DIAGNOSIS — F17200 Nicotine dependence, unspecified, uncomplicated: Secondary | ICD-10-CM

## 2013-01-17 MED ORDER — ALBUTEROL SULFATE (2.5 MG/3ML) 0.083% IN NEBU
2.5000 mg | INHALATION_SOLUTION | Freq: Once | RESPIRATORY_TRACT | Status: AC
  Administered 2013-01-18: 2.5 mg via RESPIRATORY_TRACT

## 2013-01-17 MED ORDER — IPRATROPIUM BROMIDE 0.02 % IN SOLN
0.5000 mg | Freq: Once | RESPIRATORY_TRACT | Status: AC
  Administered 2013-01-18: 0.5 mg via RESPIRATORY_TRACT

## 2013-01-17 MED ORDER — METHYLPREDNISOLONE ACETATE 80 MG/ML IJ SUSP
80.0000 mg | Freq: Once | INTRAMUSCULAR | Status: AC
  Administered 2013-01-18: 80 mg via INTRAMUSCULAR

## 2013-01-17 MED ORDER — IPRATROPIUM-ALBUTEROL 0.5-2.5 (3) MG/3ML IN SOLN: RESPIRATORY_TRACT | Status: DC

## 2013-01-17 MED ORDER — AMLODIPINE BESYLATE 5 MG PO TABS: 5.0000 mg | ORAL_TABLET | Freq: Every day | ORAL | Status: DC

## 2013-01-17 MED ORDER — ALBUTEROL SULFATE HFA 108 (90 BASE) MCG/ACT IN AERS: 2.0000 | INHALATION_SPRAY | Freq: Four times a day (QID) | RESPIRATORY_TRACT | Status: DC | PRN

## 2013-01-17 MED ORDER — PREDNISONE 10 MG PO TABS: ORAL_TABLET | ORAL | Status: DC

## 2013-01-17 MED ORDER — BUDESONIDE-FORMOTEROL FUMARATE 160-4.5 MCG/ACT IN AERO: 2.0000 | INHALATION_SPRAY | Freq: Two times a day (BID) | RESPIRATORY_TRACT | Status: DC

## 2013-01-17 NOTE — Patient Instructions (Addendum)
F/u with rectal in 4 month, please call if you need me before  Fasting CBC, lipid, CMP , TSH and PSA in the next 3 weeks please.  You are to resume blood pressure medication, I have lowered the dose.  You need to stop smoking to improve your breathing, and reduce your risk of heart disease, stroke and all types of cancer  Depo medrol in the office today, prednisone, symbicort, albuterol and duoneb treatments prescribed

## 2013-01-18 DIAGNOSIS — J449 Chronic obstructive pulmonary disease, unspecified: Secondary | ICD-10-CM

## 2013-01-19 ENCOUNTER — Ambulatory Visit: Payer: BC Managed Care – PPO | Admitting: Family Medicine

## 2013-01-24 NOTE — Assessment & Plan Note (Signed)
Deteriorating, ongoing nicotine

## 2013-01-24 NOTE — Progress Notes (Signed)
  Subjective:    Patient ID: Dillon [name], male    DOB: 1951-08-01, 62 y.o.   MRN: [id number]  HPI 3 day h/o wheeze, shortness of breath and dry cough. Denies fever, chills, sputum or nasal drainage. Has been off blood pressure meds, does not think he needs them    Review of Systems See HPI  Denies chest pains, palpitations and leg swelling Denies abdominal pain, nausea, vomiting,diarrhea or constipation.   Denies dysuria, frequency, hesitancy or incontinence. Denies joint pain, swelling and limitation in mobility. Denies headaches, seizures, numbness, or tingling. Denies depression, anxiety or insomnia. Denies skin break down or rash.        Objective:   Physical Exam  Patient alert and oriented and in moderate cardiopulmonary distress.  HEENT: No facial asymmetry, EOMI, no sinus tenderness,  oropharynx pink and moist.  Neck supple no adenopathy.  Chest: decreased air entry,bilateral wheeze, no crackles  CVS: S1, S2 no murmurs, no S3.  ABD: Soft non tender. Bowel sounds normal.  Ext: No edema  MS: Adequate ROM spine, shoulders, hips and knees.  Skin: Intact, no ulcerations or rash noted.  Psych: Good eye contact, normal affect. Memory intact not anxious or depressed appearing.  CNS: CN 2-12 intact, power, tone and sensation normal throughout.       Assessment & Plan:

## 2013-01-24 NOTE — Assessment & Plan Note (Signed)
Uncontrolled, pt needs to resume medication DASH diet and commitment to daily physical activity for a minimum of 30 minutes discussed and encouraged, as a part of hypertension management. The importance of attaining a healthy weight is also discussed.

## 2013-01-24 NOTE — Assessment & Plan Note (Signed)
Ongoing nicotine use, unwilling to set quit date. Patient counseled for approximately 5 minutes regarding the health risks of ongoing nicotine use, specifically all types of cancer, heart disease, stroke and respiratory failure. The options available for help with cessation ,the behavioral changes to assist the process, and the option to either gradully reduce usage  Or abruptly stop.is also discussed. Pt is also encouraged to set specific goals in number of cigarettes used daily, as well as to set a quit date.

## 2013-02-17 LAB — COMPREHENSIVE METABOLIC PANEL
ALT: 15 U/L (ref 0–53)
AST: 22 U/L (ref 0–37)
Albumin: 4.3 g/dL (ref 3.5–5.2)
Alkaline Phosphatase: 74 U/L (ref 39–117)
BUN: 12 mg/dL (ref 6–23)
CO2: 29 mEq/L (ref 19–32)
Calcium: 9.5 mg/dL (ref 8.4–10.5)
Chloride: 100 mEq/L (ref 96–112)
Creat: 0.9 mg/dL (ref 0.50–1.35)
Glucose, Bld: 96 mg/dL (ref 70–99)
Potassium: 4.5 mEq/L (ref 3.5–5.3)
Sodium: 138 mEq/L (ref 135–145)
Total Bilirubin: 0.8 mg/dL (ref 0.3–1.2)
Total Protein: 7.1 g/dL (ref 6.0–8.3)

## 2013-02-17 LAB — CBC WITH DIFFERENTIAL/PLATELET
Basophils Absolute: 0 10*3/uL (ref 0.0–0.1)
Basophils Relative: 0 % (ref 0–1)
Eosinophils Absolute: 0.3 10*3/uL (ref 0.0–0.7)
Eosinophils Relative: 4 % (ref 0–5)
HCT: 42.4 % (ref 39.0–52.0)
Hemoglobin: 14.7 g/dL (ref 13.0–17.0)
Lymphocytes Relative: 43 % (ref 12–46)
Lymphs Abs: 2.8 10*3/uL (ref 0.7–4.0)
MCH: 31 pg (ref 26.0–34.0)
MCHC: 34.7 g/dL (ref 30.0–36.0)
MCV: 89.5 fL (ref 78.0–100.0)
Monocytes Absolute: 0.4 10*3/uL (ref 0.1–1.0)
Monocytes Relative: 7 % (ref 3–12)
Neutro Abs: 3.1 10*3/uL (ref 1.7–7.7)
Neutrophils Relative %: 46 % (ref 43–77)
Platelets: 349 10*3/uL (ref 150–400)
RBC: 4.74 MIL/uL (ref 4.22–5.81)
RDW: 14.5 % (ref 11.5–15.5)
WBC: 6.6 10*3/uL (ref 4.0–10.5)

## 2013-02-17 LAB — LIPID PANEL
Cholesterol: 231 mg/dL — ABNORMAL HIGH (ref 0–200)
HDL: 53 mg/dL (ref >39)
LDL Cholesterol: 163 mg/dL — ABNORMAL HIGH (ref 0–99)
Total CHOL/HDL Ratio: 4.4 Ratio
Triglycerides: 75 mg/dL (ref <150)
VLDL: 15 mg/dL (ref 0–40)

## 2013-02-17 LAB — PSA: PSA: 3.92 ng/mL (ref <=4.00)

## 2013-02-17 LAB — TSH: TSH: 0.808 u[IU]/mL (ref 0.350–4.500)

## 2013-02-21 ENCOUNTER — Other Ambulatory Visit: Payer: Self-pay | Admitting: Family Medicine

## 2013-03-11 ENCOUNTER — Other Ambulatory Visit: Payer: Self-pay

## 2013-03-11 MED ORDER — PRAVASTATIN SODIUM 40 MG PO TABS: 40.0000 mg | ORAL_TABLET | Freq: Every evening | ORAL | Status: DC

## 2013-05-19 ENCOUNTER — Ambulatory Visit (INDEPENDENT_AMBULATORY_CARE_PROVIDER_SITE_OTHER): Payer: BC Managed Care – PPO | Admitting: Family Medicine

## 2013-05-19 ENCOUNTER — Ambulatory Visit: Payer: BC Managed Care – PPO | Admitting: Family Medicine

## 2013-05-19 ENCOUNTER — Encounter: Payer: Self-pay | Admitting: Family Medicine

## 2013-05-19 VITALS — BP 150/92 | HR 69 | Resp 16 | Wt 173.0 lb

## 2013-05-19 DIAGNOSIS — E785 Hyperlipidemia, unspecified: Secondary | ICD-10-CM

## 2013-05-19 DIAGNOSIS — I10 Essential (primary) hypertension: Secondary | ICD-10-CM

## 2013-05-19 DIAGNOSIS — J449 Chronic obstructive pulmonary disease, unspecified: Secondary | ICD-10-CM

## 2013-05-19 DIAGNOSIS — F17200 Nicotine dependence, unspecified, uncomplicated: Secondary | ICD-10-CM

## 2013-05-19 DIAGNOSIS — J4489 Other specified chronic obstructive pulmonary disease: Secondary | ICD-10-CM

## 2013-05-19 DIAGNOSIS — Z2911 Encounter for prophylactic immunotherapy for respiratory syncytial virus (RSV): Secondary | ICD-10-CM

## 2013-05-19 DIAGNOSIS — Z0000 Encounter for general adult medical examination without abnormal findings: Secondary | ICD-10-CM

## 2013-05-19 DIAGNOSIS — R5381 Other malaise: Secondary | ICD-10-CM

## 2013-05-19 DIAGNOSIS — Z125 Encounter for screening for malignant neoplasm of prostate: Secondary | ICD-10-CM

## 2013-05-19 DIAGNOSIS — Z1211 Encounter for screening for malignant neoplasm of colon: Secondary | ICD-10-CM

## 2013-05-19 LAB — POC HEMOCCULT BLD/STL (OFFICE/1-CARD/DIAGNOSTIC): Fecal Occult Blood, POC: NEGATIVE

## 2013-05-19 MED ORDER — BUDESONIDE-FORMOTEROL FUMARATE 160-4.5 MCG/ACT IN AERO: 2.0000 | INHALATION_SPRAY | Freq: Two times a day (BID) | RESPIRATORY_TRACT | Status: DC

## 2013-05-19 MED ORDER — AMLODIPINE BESYLATE 10 MG PO TABS: 10.0000 mg | ORAL_TABLET | Freq: Every day | ORAL | Status: DC

## 2013-05-19 NOTE — Patient Instructions (Addendum)
F/u in 3 month, call if you need me before  Coupon today for symbicort which you need to use every day to reduce risk of  Asthma attack, call if "too expensive" even with the coupon so we try an alternative drug  New higher dose of amlodipine to 10mg  daily, oK to take TWO 5mg  tablets daily till done  Pneumonia and zostavax today  Please STOP cigars in the next 3 months, CONGRATS on stopping cigarettes  Labs needed CBC, fasting lipid, cmp, PSA and TSH and VIt D  Please reconsider the colonoscopy, you NEED this

## 2013-05-22 NOTE — Progress Notes (Signed)
  Subjective:    Patient ID: Dillon [name], male    DOB: 03-Jun-1951, 62 y.o.   MRN: [id number]  HPI  Pt in for annual exam Still smoking cigars 2 to 3 per day unwilling to set a quit date, but attentive and interactive in the discussion as to the need to quit Immunization needs adresse at visit.d Blood pressure remains uncontrolled and cholesterol elevated so these issues are also addressed  Review of Systems See HPI Denies recent fever or chills. Denies sinus pressure, nasal congestion, ear pain or sore throat. Using only a rescue inhaler, states cost of maintainance drug too high, has not used coupon and will try to do this and call back.c/o shortness of breath and wheeze wit minimal activity Denies chest pains, palpitations and leg swelling Denies abdominal pain, nausea, vomiting,diarrhea or constipation.   Denies dysuria, frequency, hesitancy or incontinence. Denies joint pain, swelling and limitation in mobility. Denies headaches, seizures, numbness, or tingling. Denies depression, anxiety or insomnia. Denies skin break down or rash.        Objective:   Physical Exam  Pleasant well nourished male, alert and oriented x 3, in no cardio-pulmonary distress. Afebrile. HEENT No facial trauma or asymetry. Sinuses non tender. EOMI, PERTL, fundoscopic exam is negative for hemorhages or exudates. External ears normal, tympanic membranes clear. Oropharynx moist, no exudate, poor dentition. Neck: supple, no adenopathy,JVD or thyromegaly.No bruits.  Chest: Clear to ascultation bilaterally.Markedly reduced air entry throughout, no wheeze or crackle. Non tender to palpation  Breast: No asymetry,no masses. No nipple discharge or inversion. No axillary or supraclavicular adenopathy  Cardiovascular system; Heart sounds normal,  S1 and  S2 ,no S3.  No murmur, or thrill. Apical beat not displaced Peripheral pulses normal.  Abdomen: Soft, non tender, no organomegaly or  masses. No bruits. Bowel sounds normal. No guarding, tenderness or rebound.  Rectal:  No mass. guaiac negative stool. Prostate smooth and firm  Musculoskeletal exam: Full ROM of spine, hips , shoulders and knees. No deformity ,swelling or crepitus noted. No muscle wasting or atrophy.   Neurologic: Cranial nerves 2 to 12 intact. Power, tone ,sensation and reflexes normal throughout. No disturbance in gait. No tremor.  Skin: Intact, no ulceration, erythema , scaling or rash noted. Pigmentation normal throughout  Psych; Normal mood and affect. Judgement and concentration normal        Assessment & Plan:

## 2013-05-22 NOTE — Assessment & Plan Note (Signed)
Improved, has stopped cigarettes , still needs to quit cigars Patient counseled for approximately 5 minutes regarding the health risks of ongoing nicotine use, specifically all types of cancer, heart disease, stroke and respiratory failure. The options available for help with cessation ,the behavioral changes to assist the process, and the option to either gradully reduce usage  Or abruptly stop.is also discussed. Pt is also encouraged to set specific goals in number of cigarettes used daily, as well as to set a quit date.

## 2013-05-22 NOTE — Assessment & Plan Note (Signed)
Exam completed as documented and immunization updated

## 2013-05-22 NOTE — Assessment & Plan Note (Signed)
uncontrolled , with increased CVD risk Hyperlipidemia:Low fat diet discussed and encouraged.  Pt to start statin

## 2013-05-22 NOTE — Assessment & Plan Note (Signed)
Uncontrolled, dose increase in amlodipine DASH diet and commitment to daily physical activity for a minimum of 30 minutes discussed and encouraged, as a part of hypertension management. The importance of attaining a healthy weight is also discussed.  

## 2013-07-07 ENCOUNTER — Ambulatory Visit (INDEPENDENT_AMBULATORY_CARE_PROVIDER_SITE_OTHER): Payer: BC Managed Care – PPO | Admitting: Family Medicine

## 2013-07-07 VITALS — HR 100 | Resp 22

## 2013-07-07 DIAGNOSIS — I10 Essential (primary) hypertension: Secondary | ICD-10-CM

## 2013-07-07 DIAGNOSIS — J449 Chronic obstructive pulmonary disease, unspecified: Secondary | ICD-10-CM

## 2013-07-08 ENCOUNTER — Encounter: Payer: Self-pay | Admitting: Family Medicine

## 2013-07-08 DIAGNOSIS — J449 Chronic obstructive pulmonary disease, unspecified: Secondary | ICD-10-CM

## 2013-07-08 MED ORDER — IPRATROPIUM BROMIDE 0.02 % IN SOLN
0.5000 mg | Freq: Once | RESPIRATORY_TRACT | Status: AC
  Administered 2013-07-08: 0.5 mg via RESPIRATORY_TRACT

## 2013-07-08 MED ORDER — ALBUTEROL SULFATE (5 MG/ML) 0.5% IN NEBU
2.5000 mg | INHALATION_SOLUTION | Freq: Once | RESPIRATORY_TRACT | Status: AC
  Administered 2013-07-08: 2.5 mg via RESPIRATORY_TRACT

## 2013-07-08 MED ORDER — METHYLPREDNISOLONE ACETATE 80 MG/ML IJ SUSP
80.0000 mg | Freq: Once | INTRAMUSCULAR | Status: AC
  Administered 2013-07-08: 80 mg via INTRAMUSCULAR

## 2013-07-11 ENCOUNTER — Ambulatory Visit (INDEPENDENT_AMBULATORY_CARE_PROVIDER_SITE_OTHER): Payer: BC Managed Care – PPO | Admitting: Family Medicine

## 2013-07-11 ENCOUNTER — Encounter: Payer: Self-pay | Admitting: Family Medicine

## 2013-07-11 VITALS — BP 138/80 | HR 82 | Resp 18 | Ht 74.75 in | Wt 169.0 lb

## 2013-07-11 DIAGNOSIS — F17200 Nicotine dependence, unspecified, uncomplicated: Secondary | ICD-10-CM

## 2013-07-11 DIAGNOSIS — E785 Hyperlipidemia, unspecified: Secondary | ICD-10-CM

## 2013-07-11 DIAGNOSIS — J449 Chronic obstructive pulmonary disease, unspecified: Secondary | ICD-10-CM

## 2013-07-11 DIAGNOSIS — I10 Essential (primary) hypertension: Secondary | ICD-10-CM

## 2013-07-11 NOTE — Progress Notes (Signed)
  Subjective:    Patient ID: Dillon [name], male    DOB: [birth year], 62 y.o.   MRN: [id number]  HPI Pt in for re evaluation prior to being released to return to work, following acute flare several days ago when he was sent home off the job Pt reports symptom improvement. No longer short of breath with excessive wheezing Understands and accepts the fact that quitting smoking is his path to improved health, but unwilling to set a quit date still   Review of Systems See HPI Denies recent fever or chills.Fatigue with minimal activity due to poor lung function Denies sinus pressure, nasal congestion, ear pain or sore throat. Denies chest congestion, productive cough or wheezing. Denies chest pains, palpitations and leg swelling Denies abdominal pain, nausea, vomiting,diarrhea or constipation.   Denies dysuria, frequency, hesitancy or incontinence. Denies joint pain, swelling and limitation in mobility. Denies headaches, seizures, numbness, or tingling. Denies depression, anxiety or insomnia. Denies skin break down or rash.        Objective:   Physical Exam  Patient alert and oriented and in no cardiopulmonary distress.  HEENT: No facial asymmetry, EOMI, no sinus tenderness,  oropharynx pink and moist.  Neck supple no adenopathy.  Chest: Clear to auscultation bilaterally.Decreased air entry throughout  CVS: S1, S2 no murmurs, no S3.  ABD: Soft non tender. Bowel sounds normal.  Ext: No edema  MS: Adequate ROM spine, shoulders, hips and knees.  Skin: Intact, no ulcerations or rash noted.  Psych: Good eye contact, normal affect. Memory intact not anxious or depressed appearing.  CNS: CN 2-12 intact, power, tone and sensation normal throughout.       Assessment & Plan:

## 2013-07-11 NOTE — Assessment & Plan Note (Addendum)
exaccerbation of COPD. Depomedrol and neb treatment in office followed oral steroids , aggressive course of home meds f/u in office before return to work Needs spirometry as well as evaluation for oxygen need

## 2013-07-11 NOTE — Assessment & Plan Note (Signed)
Not assessed at this visit will evaluate on his return Last visit this was uncontrolled

## 2013-07-11 NOTE — Patient Instructions (Addendum)
F/u in 3 month, call if you need me before please.  You need to stop smoking , you have lung damage from cigarette smoke  CXR today/tomorrow please  You are referred for lung function test  Return to work today with no restrictions  Fasting lipid and cmp in 3 month before visit   We will provide info on the free smoking cessation classes at the health dept  PLease reconsider the flu vaccine, you need this

## 2013-07-11 NOTE — Progress Notes (Signed)
  Subjective:    Patient ID: Dillon [name], male    DOB: 22-Jul-1951, 62 y.o.   MRN: [id number]  HPI Pt walked into the office at 4:45 pm c/o being sent off the job because of inability to breathe.  Nursing also sated that pt c/o chest pain however he never told me that. When I assessed the pt he was able to speak in short sentences and his pulse ox was 97% on supplemental oxygen  EMS had been called to transpt to the ED Pt has COPD which is  inadequately controled. No recent h/o fever, chil;ls or sputum production  Pt had to wait for over 20 minutes for EMS to arrive and when they did arrive he refused to go to the ED stating that he was feeling better just on supplemental oxygen, and he remained in the front office for further treatment. Acute management of COPD flare was instituted in full before he was d/c home untuil re eval in 4 days.   Review of Systems See HPI Denies recent fever or chills. Denies sinus pressure, nasal congestion, ear pain or sore throat. Denies chest congestion or  productive cough  Denies chest pains, palpitations and leg swelling        Objective:   Physical Exam  Pt alert and oriented in moderate c/p distress  HEENT: No facial asymmetry, EOMI, no sinus tenderness,  oropharynx pink and moist.  Neck supple no adenopathy.  Chest: decreased air entry bilateral wheezes, no crackles  CVS: S1, S2 no murmurs, no S3.  ABD: Soft non tender.  Ext: No edema   Psych: Good eye contact, normal affect. Memory intact not anxious or depressed appearing.         Assessment & Plan:

## 2013-07-12 ENCOUNTER — Telehealth: Payer: Self-pay | Admitting: Family Medicine

## 2013-07-12 LAB — COMPREHENSIVE METABOLIC PANEL
ALT: 14 U/L (ref 0–53)
AST: 16 U/L (ref 0–37)
Albumin: 4.2 g/dL (ref 3.5–5.2)
Alkaline Phosphatase: 67 U/L (ref 39–117)
BUN: 12 mg/dL (ref 6–23)
CO2: 29 mEq/L (ref 19–32)
Calcium: 9.7 mg/dL (ref 8.4–10.5)
Chloride: 100 mEq/L (ref 96–112)
Creat: 0.9 mg/dL (ref 0.50–1.35)
Glucose, Bld: 102 mg/dL — ABNORMAL HIGH (ref 70–99)
Potassium: 4.8 mEq/L (ref 3.5–5.3)
Sodium: 138 mEq/L (ref 135–145)
Total Bilirubin: 0.6 mg/dL (ref 0.3–1.2)
Total Protein: 6.9 g/dL (ref 6.0–8.3)

## 2013-07-12 LAB — LIPID PANEL
Cholesterol: 202 mg/dL — ABNORMAL HIGH (ref 0–200)
HDL: 72 mg/dL (ref >39)
LDL Cholesterol: 122 mg/dL — ABNORMAL HIGH (ref 0–99)
Total CHOL/HDL Ratio: 2.8 Ratio
Triglycerides: 40 mg/dL (ref <150)
VLDL: 8 mg/dL (ref 0–40)

## 2013-07-12 NOTE — Telephone Encounter (Signed)
This form completed right

## 2013-07-12 NOTE — Telephone Encounter (Signed)
Form completed right

## 2013-07-13 ENCOUNTER — Telehealth: Payer: Self-pay | Admitting: Family Medicine

## 2013-07-14 NOTE — Telephone Encounter (Signed)
Patient is aware of his appointment for the PFT at [facility] 9.24.2014 be there at 7:45 patient aware

## 2013-07-18 NOTE — Telephone Encounter (Signed)
As of 09/22/ I have no FMLA form for this pt

## 2013-07-20 ENCOUNTER — Ambulatory Visit (HOSPITAL_COMMUNITY)
Admission: RE | Admit: 2013-07-20 | Discharge: 2013-07-20 | Disposition: A | Discharge status: Home / Self Care | Payer: BC Managed Care – PPO | Source: Ambulatory Visit | Attending: Family Medicine | Admitting: Family Medicine

## 2013-07-20 DIAGNOSIS — R0989 Other specified symptoms and signs involving the circulatory and respiratory systems: Secondary | ICD-10-CM | POA: Insufficient documentation

## 2013-07-20 DIAGNOSIS — J4489 Other specified chronic obstructive pulmonary disease: Secondary | ICD-10-CM | POA: Insufficient documentation

## 2013-07-20 DIAGNOSIS — R0609 Other forms of dyspnea: Secondary | ICD-10-CM | POA: Insufficient documentation

## 2013-07-20 DIAGNOSIS — J449 Chronic obstructive pulmonary disease, unspecified: Secondary | ICD-10-CM

## 2013-07-20 MED ORDER — ALBUTEROL SULFATE (5 MG/ML) 0.5% IN NEBU
2.5000 mg | INHALATION_SOLUTION | Freq: Once | RESPIRATORY_TRACT | Status: AC
  Administered 2013-07-20: 2.5 mg via RESPIRATORY_TRACT

## 2013-07-20 NOTE — Telephone Encounter (Signed)
Patient brought in FMLA papers today and gave them to [facility]

## 2013-07-22 ENCOUNTER — Telehealth: Payer: Self-pay | Admitting: Family Medicine

## 2013-07-22 NOTE — Telephone Encounter (Signed)
Noted, thank you

## 2013-07-26 NOTE — Procedures (Signed)
NAMESTELLAN, Dillon [name]                 ACCOUNT NO.:  [id number]  MEDICAL RECORD NO.:  [id number]  LOCATION:  RESP                          FACILITY:  [facility]  PHYSICIAN:  Kalei Meda L. Juanetta Gosling, M.D.DATE OF BIRTH:  [birth year]  DATE OF PROCEDURE: DATE OF DISCHARGE:  07/20/2013                           PULMONARY FUNCTION TEST   Reason for pulmonary function testing is COPD.  1. Spirometry shows a mild ventilatory defect with evidence of airflow     obstruction. 2. Lung volume show air trapping. 3. DLCO has moderately reduced. 4. Airway resistance is normal. 5. There is no significant bronchodilator improvement. 6. This study is consistent with COPD considering the patient's     smoking history.     Roise Emert L. Juanetta Gosling, M.D.     [facility]/MEDQ  D:  07/25/2013  T:  07/26/2013  Job:  [id number]  cc:   Milus Mallick. Lodema Hong, M.D. Fax: [phone]

## 2013-07-27 ENCOUNTER — Telehealth: Payer: Self-pay | Admitting: Family Medicine

## 2013-07-27 LAB — PULMONARY FUNCTION TEST

## 2013-07-27 NOTE — Telephone Encounter (Signed)
Please let pt know that lis PFT shows that he has COPD, not asthma. Stopping smoking will help a lot

## 2013-07-28 NOTE — Telephone Encounter (Signed)
Called patient and left message for them to return call at the office   

## 2013-08-03 ENCOUNTER — Telehealth: Payer: Self-pay | Admitting: Family Medicine

## 2013-08-03 NOTE — Telephone Encounter (Signed)
Attempted to call patient back again and had to leave a message. See result note

## 2013-08-03 NOTE — Telephone Encounter (Signed)
Called patient and left message for them to return call at the office   

## 2013-08-05 NOTE — Telephone Encounter (Signed)
Called and left message for patient notifying of results.

## 2013-08-15 ENCOUNTER — Telehealth: Payer: Self-pay | Admitting: Family Medicine

## 2013-08-15 DIAGNOSIS — I10 Essential (primary) hypertension: Secondary | ICD-10-CM

## 2013-08-15 MED ORDER — AMLODIPINE BESYLATE 10 MG PO TABS: 10.0000 mg | ORAL_TABLET | Freq: Every day | ORAL | Status: DC

## 2013-08-15 MED ORDER — PRAVASTATIN SODIUM 40 MG PO TABS: 40.0000 mg | ORAL_TABLET | Freq: Every evening | ORAL | Status: DC

## 2013-08-15 NOTE — Telephone Encounter (Signed)
meds sent

## 2013-08-20 NOTE — Assessment & Plan Note (Signed)
Controlled, no change in medication DASH diet and commitment to daily physical activity for a minimum of 30 minutes discussed and encouraged, as a part of hypertension management. The importance of attaining a healthy weight is also discussed.  

## 2013-08-20 NOTE — Assessment & Plan Note (Signed)
Uncontrolled. Med adherence stressed Hyperlipidemia:Low fat diet discussed and encouraged.

## 2013-08-20 NOTE — Assessment & Plan Note (Signed)
Unchanged. Patient counseled for approximately 5 minutes regarding the health risks of ongoing nicotine use, specifically all types of cancer, heart disease, stroke and respiratory failure. The options available for help with cessation ,the behavioral changes to assist the process, and the option to either gradully reduce usage  Or abruptly stop.is also discussed. Pt is also encouraged to set specific goals in number of cigarettes used daily, as well as to set a quit date.  

## 2013-08-20 NOTE — Assessment & Plan Note (Signed)
Worsening due to ongoing nicotine use. PFT to be repeated and needs CXr Pt re educated re the importance of daily use of maintenance inhalers to improve breathing

## 2013-08-23 ENCOUNTER — Telehealth: Payer: Self-pay | Admitting: Family Medicine

## 2013-08-23 ENCOUNTER — Ambulatory Visit: Payer: BC Managed Care – PPO | Admitting: Family Medicine

## 2013-08-23 DIAGNOSIS — I10 Essential (primary) hypertension: Secondary | ICD-10-CM

## 2013-08-23 MED ORDER — PRAVASTATIN SODIUM 40 MG PO TABS: 40.0000 mg | ORAL_TABLET | Freq: Every evening | ORAL | Status: DC

## 2013-08-23 MED ORDER — AMLODIPINE BESYLATE 10 MG PO TABS: 10.0000 mg | ORAL_TABLET | Freq: Every day | ORAL | Status: DC

## 2013-08-23 NOTE — Telephone Encounter (Signed)
Refills sent to [facility]

## 2013-08-23 NOTE — Telephone Encounter (Signed)
Dillon [name] is stating his insurance is no good please send [facility] [place]

## 2013-09-14 ENCOUNTER — Other Ambulatory Visit: Payer: Self-pay

## 2013-09-14 ENCOUNTER — Telehealth: Payer: Self-pay | Admitting: Family Medicine

## 2013-09-14 DIAGNOSIS — J449 Chronic obstructive pulmonary disease, unspecified: Secondary | ICD-10-CM

## 2013-09-14 DIAGNOSIS — J4489 Other specified chronic obstructive pulmonary disease: Secondary | ICD-10-CM

## 2013-09-14 MED ORDER — BUDESONIDE-FORMOTEROL FUMARATE 160-4.5 MCG/ACT IN AERO: 2.0000 | INHALATION_SPRAY | Freq: Two times a day (BID) | RESPIRATORY_TRACT | Status: DC

## 2013-09-14 NOTE — Telephone Encounter (Signed)
[facility] to [facility]

## 2013-10-25 ENCOUNTER — Ambulatory Visit: Payer: BC Managed Care – PPO | Admitting: Family Medicine

## 2013-10-27 DIAGNOSIS — I739 Peripheral vascular disease, unspecified: Secondary | ICD-10-CM

## 2013-10-27 HISTORY — DX: Peripheral vascular disease, unspecified: I73.9

## 2013-12-06 ENCOUNTER — Encounter: Payer: Self-pay | Admitting: Family Medicine

## 2013-12-06 ENCOUNTER — Ambulatory Visit (INDEPENDENT_AMBULATORY_CARE_PROVIDER_SITE_OTHER): Payer: BC Managed Care – PPO | Admitting: Family Medicine

## 2013-12-06 ENCOUNTER — Encounter (INDEPENDENT_AMBULATORY_CARE_PROVIDER_SITE_OTHER): Payer: Self-pay

## 2013-12-06 VITALS — BP 150/92 | HR 68 | Resp 16 | Ht 74.0 in | Wt 172.0 lb

## 2013-12-06 DIAGNOSIS — J449 Chronic obstructive pulmonary disease, unspecified: Secondary | ICD-10-CM

## 2013-12-06 DIAGNOSIS — Z125 Encounter for screening for malignant neoplasm of prostate: Secondary | ICD-10-CM

## 2013-12-06 DIAGNOSIS — R5381 Other malaise: Secondary | ICD-10-CM

## 2013-12-06 DIAGNOSIS — R5383 Other fatigue: Secondary | ICD-10-CM

## 2013-12-06 DIAGNOSIS — I10 Essential (primary) hypertension: Secondary | ICD-10-CM

## 2013-12-06 DIAGNOSIS — E785 Hyperlipidemia, unspecified: Secondary | ICD-10-CM

## 2013-12-06 DIAGNOSIS — F17200 Nicotine dependence, unspecified, uncomplicated: Secondary | ICD-10-CM

## 2013-12-06 NOTE — Patient Instructions (Signed)
F/U in 4 months, with rectal, call if you need me before  Blood pressure is too high, make changes discussed please.  Set a quit date for your health, and stop all smoking Use symbicort every day to help breathing  Carry albuterol with you as a rescue inhaler  Chest scan to be scheduled for March per your request  It is important that you exercise regularly at least 30 minutes 5 times a week. If you develop chest pain, have severe difficulty breathing, or feel very tired, stop exercising immediately and seek medical attention    A healthy diet is rich in fruit, vegetables and whole grains. Poultry fish, nuts and beans are a healthy choice for protein rather then red meat. A low sodium diet and drinking 64 ounces of water daily is generally recommended. Oils and sweet should be limited. Carbohydrates especially for those who are diabetic or overweight, should be limited to 45 to 60 gram per meal. It is important to eat on a regular schedule, at least 3 times daily. Snacks should be primarily fruits, vegetables or nuts.  Adequate rest, generally 6 to 8 hours per night is important for good health.Good sleep hygiene involves setting a regular bedtime, and turning off all sound and light in your sleep environment.Limiting caffeine intake will also help with the ability to rest well  Lab work needs to be done 3 to 5 days before your follow up visit please.Fasting lipid, cmp and EGFr, CBC and PSA  All medications need to be brought to every visit

## 2013-12-17 NOTE — Assessment & Plan Note (Signed)
Hyperlipidemia:Low fat diet discussed and encouraged.  Updated lab needed at/ before next visit.  

## 2013-12-17 NOTE — Assessment & Plan Note (Signed)

## 2013-12-17 NOTE — Assessment & Plan Note (Addendum)
Worsening due to ongoing nicotine use Educated re use of daily inhalers to assist breathing and the need to quit smoking Due to prolonged smoking history will obtain chest scan

## 2013-12-17 NOTE — Assessment & Plan Note (Signed)
Uncontrolled, needs higher dose of medication bbut refusing DASH diet and commitment to daily physical activity for a minimum of 30 minutes discussed and encouraged, as a part of hypertension management. The importance of attaining a healthy weight is also discussed.

## 2013-12-17 NOTE — Progress Notes (Signed)
   Subjective:    Patient ID: Dillon [name], male    DOB: January 12, 1951, 63 y.o.   MRN: [id number]  HPI The PT is here for follow up and re-evaluation of chronic medical conditions, medication management and review of any available recent lab and radiology data.  Preventive health is updated, specifically  Cancer screening and Immunization.   Questions or concerns regarding consultations or procedures which the PT has had in the interim are  addressed. The PT denies any adverse reactions to current medications since the last visit.  There are no new concerns.  There are no specific complaints       Review of Systems See HPI Denies recent fever or chills. Denies sinus pressure, nasal congestion, ear pain or sore throat. Denies chest congestion, productive cough or wheezing.Poor exercise tolerance noted Denies chest pains, palpitations and leg swelling Denies abdominal pain, nausea, vomiting,diarrhea or constipation.   Denies dysuria, frequency, hesitancy or incontinence. Denies joint pain, swelling and limitation in mobility. Denies headaches, seizures, numbness, or tingling. Denies depression, anxiety or insomnia. Denies skin break down or rash.        Objective:   Physical Exam BP 150/92  Pulse 68  Resp 16  Ht 6\' 2"  (1.88 m)  Wt 172 lb (78.019 kg)  BMI 22.07 kg/m2  SpO2 99% Patient alert and oriented and in no cardiopulmonary distress.  HEENT: No facial asymmetry, EOMI, no sinus tenderness,  oropharynx pink and moist.  Neck supple no adenopathy.  Chest: Clear to auscultation bilaterally.Markedly decreased air entry throughout  CVS: S1, S2 no murmurs, no S3.  ABD: Soft non tender. Bowel sounds normal.  Ext: No edema  MS: Adequate ROM spine, shoulders, hips and knees.  Skin: Intact, no ulcerations or rash noted.  Psych: Good eye contact, normal affect. Memory intact not anxious or depressed appearing.  CNS: CN 2-12 intact, power, tone and sensation normal  throughout.        Assessment & Plan:  HYPERTENSION Uncontrolled, needs higher dose of medication bbut refusing DASH diet and commitment to daily physical activity for a minimum of 30 minutes discussed and encouraged, as a part of hypertension management. The importance of attaining a healthy weight is also discussed.   COPD Worsening due to ongoing nicotine use Educated re use of daily inhalers to assist breathing and the need to quit smoking Due to prolonged smoking history will obtain chest scan  NICOTINE ADDICTION Patient counseled for approximately 5 minutes regarding the health risks of ongoing nicotine use, specifically all types of cancer, heart disease, stroke and respiratory failure. The options available for help with cessation ,the behavioral changes to assist the process, and the option to either gradully reduce usage  Or abruptly stop.is also discussed. Pt is also encouraged to set specific goals in number of cigarettes used daily, as well as to set a quit date.   OTHER AND UNSPECIFIED HYPERLIPIDEMIA Hyperlipidemia:Low fat diet discussed and encouraged.  Updated lab needed at/ before next visit.

## 2014-01-16 ENCOUNTER — Ambulatory Visit (HOSPITAL_COMMUNITY): Admission: RE | Admit: 2014-01-16 | Payer: BC Managed Care – PPO | Source: Ambulatory Visit

## 2014-01-30 ENCOUNTER — Telehealth: Payer: Self-pay | Admitting: Family Medicine

## 2014-01-30 DIAGNOSIS — J449 Chronic obstructive pulmonary disease, unspecified: Secondary | ICD-10-CM

## 2014-01-30 NOTE — Telephone Encounter (Signed)
Unable to find order needing e-signature.  Order re-entered for dr to sign off.

## 2014-01-31 ENCOUNTER — Ambulatory Visit (HOSPITAL_COMMUNITY)
Admission: RE | Admit: 2014-01-31 | Discharge: 2014-01-31 | Disposition: A | Discharge status: Home / Self Care | Payer: BC Managed Care – PPO | Source: Ambulatory Visit | Attending: Family Medicine | Admitting: Family Medicine

## 2014-01-31 DIAGNOSIS — R911 Solitary pulmonary nodule: Secondary | ICD-10-CM | POA: Insufficient documentation

## 2014-01-31 DIAGNOSIS — J449 Chronic obstructive pulmonary disease, unspecified: Secondary | ICD-10-CM

## 2014-01-31 DIAGNOSIS — J438 Other emphysema: Secondary | ICD-10-CM | POA: Insufficient documentation

## 2014-01-31 LAB — CBC WITH DIFFERENTIAL/PLATELET
Basophils Absolute: 0 10*3/uL (ref 0.0–0.1)
Basophils Relative: 0 % (ref 0–1)
Eosinophils Absolute: 0.2 10*3/uL (ref 0.0–0.7)
Eosinophils Relative: 4 % (ref 0–5)
HCT: 38.9 % — ABNORMAL LOW (ref 39.0–52.0)
Hemoglobin: 13.6 g/dL (ref 13.0–17.0)
Lymphocytes Relative: 48 % — ABNORMAL HIGH (ref 12–46)
Lymphs Abs: 2.8 10*3/uL (ref 0.7–4.0)
MCH: 30.8 pg (ref 26.0–34.0)
MCHC: 35 g/dL (ref 30.0–36.0)
MCV: 88.2 fL (ref 78.0–100.0)
Monocytes Absolute: 0.5 10*3/uL (ref 0.1–1.0)
Monocytes Relative: 8 % (ref 3–12)
Neutro Abs: 2.4 10*3/uL (ref 1.7–7.7)
Neutrophils Relative %: 40 % — ABNORMAL LOW (ref 43–77)
Platelets: 288 10*3/uL (ref 150–400)
RBC: 4.41 MIL/uL (ref 4.22–5.81)
RDW: 14.3 % (ref 11.5–15.5)
WBC: 5.9 10*3/uL (ref 4.0–10.5)

## 2014-01-31 LAB — COMPLETE METABOLIC PANEL WITH GFR
ALT: 15 U/L (ref 0–53)
AST: 24 U/L (ref 0–37)
Albumin: 4 g/dL (ref 3.5–5.2)
Alkaline Phosphatase: 70 U/L (ref 39–117)
BUN: 14 mg/dL (ref 6–23)
CO2: 28 mEq/L (ref 19–32)
Calcium: 8.8 mg/dL (ref 8.4–10.5)
Chloride: 104 mEq/L (ref 96–112)
Creat: 0.99 mg/dL (ref 0.50–1.35)
GFR, Est African American: >89 mL/min
GFR, Est Non African American: 82 mL/min
Glucose, Bld: 71 mg/dL (ref 70–99)
Potassium: 3.7 mEq/L (ref 3.5–5.3)
Sodium: 140 mEq/L (ref 135–145)
Total Bilirubin: 0.5 mg/dL (ref 0.2–1.2)
Total Protein: 6.5 g/dL (ref 6.0–8.3)

## 2014-01-31 LAB — LIPID PANEL
Cholesterol: 136 mg/dL (ref 0–200)
HDL: 54 mg/dL (ref >39)
LDL Cholesterol: 70 mg/dL (ref 0–99)
Total CHOL/HDL Ratio: 2.5 Ratio
Triglycerides: 59 mg/dL (ref <150)
VLDL: 12 mg/dL (ref 0–40)

## 2014-02-01 ENCOUNTER — Other Ambulatory Visit: Payer: Self-pay | Admitting: Family Medicine

## 2014-02-01 DIAGNOSIS — R911 Solitary pulmonary nodule: Secondary | ICD-10-CM

## 2014-02-01 DIAGNOSIS — J449 Chronic obstructive pulmonary disease, unspecified: Secondary | ICD-10-CM

## 2014-02-01 LAB — PSA: PSA: 3.76 ng/mL (ref <=4.00)

## 2014-02-10 ENCOUNTER — Other Ambulatory Visit (HOSPITAL_COMMUNITY): Payer: Self-pay | Admitting: Pulmonary Disease

## 2014-02-10 DIAGNOSIS — R911 Solitary pulmonary nodule: Secondary | ICD-10-CM

## 2014-02-13 ENCOUNTER — Telehealth: Payer: Self-pay

## 2014-02-13 ENCOUNTER — Other Ambulatory Visit: Payer: Self-pay

## 2014-02-13 DIAGNOSIS — J449 Chronic obstructive pulmonary disease, unspecified: Secondary | ICD-10-CM

## 2014-02-13 MED ORDER — IPRATROPIUM-ALBUTEROL 0.5-2.5 (3) MG/3ML IN SOLN: RESPIRATORY_TRACT | Status: DC

## 2014-02-13 NOTE — Telephone Encounter (Signed)
Med reordered.

## 2014-02-16 ENCOUNTER — Ambulatory Visit (HOSPITAL_COMMUNITY)
Admission: RE | Admit: 2014-02-16 | Discharge: 2014-02-16 | Disposition: A | Discharge status: Home / Self Care | Payer: BC Managed Care – PPO | Source: Ambulatory Visit | Attending: Pulmonary Disease | Admitting: Pulmonary Disease

## 2014-02-16 DIAGNOSIS — R911 Solitary pulmonary nodule: Secondary | ICD-10-CM | POA: Insufficient documentation

## 2014-02-16 LAB — GLUCOSE, CAPILLARY: Glucose-Capillary: 87 mg/dL (ref 70–99)

## 2014-02-16 MED ORDER — FLUDEOXYGLUCOSE F - 18 (FDG) INJECTION
9.3000 | Freq: Once | INTRAVENOUS | Status: AC | PRN
  Administered 2014-02-16: 9.3 via INTRAVENOUS

## 2014-02-20 ENCOUNTER — Telehealth: Payer: Self-pay

## 2014-02-20 NOTE — Telephone Encounter (Signed)
Ps let him know PET sacn does not show abnormalities corresponding to thoose on the chest scan, but a rept chest scan is recommended in next several months. Pls also let him know Let him know that the scan shows coronary artery atherosclerosis, needs to not smoke, keep cholesterol normal, also blood pressure, to reduce heart attack risk , if nt on a statin starting lipitor 10mg  daily may be recommended, Needs to have appt with me too further address this.If has family h/o cAD under age 32, or personal h/o chest pain and increased exertional fatigue will need to see cardiologist, let me know response pls

## 2014-02-21 ENCOUNTER — Emergency Department (HOSPITAL_COMMUNITY): Payer: BC Managed Care – PPO

## 2014-02-21 ENCOUNTER — Emergency Department (HOSPITAL_COMMUNITY)
Admission: EM | Admit: 2014-02-21 | Discharge: 2014-02-22 | Disposition: A | Discharge status: Home / Self Care | Payer: BC Managed Care – PPO | Attending: Emergency Medicine | Admitting: Emergency Medicine

## 2014-02-21 ENCOUNTER — Encounter (HOSPITAL_COMMUNITY): Payer: Self-pay | Admitting: Emergency Medicine

## 2014-02-21 DIAGNOSIS — F17200 Nicotine dependence, unspecified, uncomplicated: Secondary | ICD-10-CM | POA: Insufficient documentation

## 2014-02-21 DIAGNOSIS — Y9389 Activity, other specified: Secondary | ICD-10-CM | POA: Insufficient documentation

## 2014-02-21 DIAGNOSIS — IMO0002 Reserved for concepts with insufficient information to code with codable children: Secondary | ICD-10-CM | POA: Insufficient documentation

## 2014-02-21 DIAGNOSIS — Z79899 Other long term (current) drug therapy: Secondary | ICD-10-CM | POA: Insufficient documentation

## 2014-02-21 DIAGNOSIS — Y9289 Other specified places as the place of occurrence of the external cause: Secondary | ICD-10-CM | POA: Insufficient documentation

## 2014-02-21 DIAGNOSIS — J441 Chronic obstructive pulmonary disease with (acute) exacerbation: Secondary | ICD-10-CM | POA: Insufficient documentation

## 2014-02-21 DIAGNOSIS — Z7982 Long term (current) use of aspirin: Secondary | ICD-10-CM | POA: Insufficient documentation

## 2014-02-21 DIAGNOSIS — Y990 Civilian activity done for income or pay: Secondary | ICD-10-CM | POA: Insufficient documentation

## 2014-02-21 DIAGNOSIS — Z880 Allergy status to penicillin: Secondary | ICD-10-CM | POA: Insufficient documentation

## 2014-02-21 DIAGNOSIS — J45901 Unspecified asthma with (acute) exacerbation: Principal | ICD-10-CM

## 2014-02-21 DIAGNOSIS — T5991XA Toxic effect of unspecified gases, fumes and vapors, accidental (unintentional), initial encounter: Secondary | ICD-10-CM | POA: Insufficient documentation

## 2014-02-21 DIAGNOSIS — I10 Essential (primary) hypertension: Secondary | ICD-10-CM | POA: Insufficient documentation

## 2014-02-21 HISTORY — DX: Chronic obstructive pulmonary disease, unspecified: J44.9

## 2014-02-21 MED ORDER — ALBUTEROL SULFATE (2.5 MG/3ML) 0.083% IN NEBU
2.5000 mg | INHALATION_SOLUTION | Freq: Once | RESPIRATORY_TRACT | Status: AC
  Administered 2014-02-21: 2.5 mg via RESPIRATORY_TRACT
  Filled 2014-02-21: qty 3

## 2014-02-21 MED ORDER — ALBUTEROL SULFATE (2.5 MG/3ML) 0.083% IN NEBU
5.0000 mg | INHALATION_SOLUTION | Freq: Once | RESPIRATORY_TRACT | Status: AC
  Administered 2014-02-21: 5 mg via RESPIRATORY_TRACT
  Filled 2014-02-21: qty 6

## 2014-02-21 MED ORDER — IPRATROPIUM BROMIDE 0.02 % IN SOLN
0.5000 mg | Freq: Once | RESPIRATORY_TRACT | Status: AC
  Administered 2014-02-21: 0.5 mg via RESPIRATORY_TRACT
  Filled 2014-02-21: qty 2.5

## 2014-02-21 MED ORDER — DEXAMETHASONE SODIUM PHOSPHATE 10 MG/ML IJ SOLN
10.0000 mg | Freq: Once | INTRAMUSCULAR | Status: AC
  Administered 2014-02-21: 10 mg via INTRAMUSCULAR
  Filled 2014-02-21: qty 1

## 2014-02-21 NOTE — ED Notes (Signed)
Pt reports worsening of his sob this afternoon, using his inhaler without relief, denies chest pain
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
## 2014-02-22 MED ORDER — PREDNISONE 20 MG PO TABS: ORAL_TABLET | ORAL | Status: DC

## 2014-02-22 NOTE — Discharge Instructions (Signed)
Chronic Obstructive Pulmonary Disease  Chronic obstructive pulmonary disease (COPD) is a common lung condition in which airflow from the lungs is limited. COPD is a general term that can be used to describe many different lung problems that limit airflow, including both chronic bronchitis and emphysema.  If you have COPD, your lung function will probably never return to normal, but there are measures you can take to improve lung function and make yourself feel better.   CAUSES   · Smoking (common).    · Exposure to secondhand smoke.    · Genetic problems.  · Chronic inflammatory lung diseases or recurrent infections.  SYMPTOMS   · Shortness of breath, especially with physical activity.    · Deep, persistent (chronic) cough with a large amount of thick mucus.    · Wheezing.    · Rapid breaths (tachypnea).    · Gray or bluish discoloration (cyanosis) of the skin, especially in fingers, toes, or lips.    · Fatigue.    · Weight loss.    · Frequent infections or episodes when breathing symptoms become much worse (exacerbations).    · Chest tightness.  DIAGNOSIS   Your healthcare provider will take a medical history and perform a physical examination to make the initial diagnosis.  Additional tests for COPD may include:   · Lung (pulmonary) function tests.  · Chest X-ray.  · CT scan.  · Blood tests.  TREATMENT   Treatment available to help you feel better when you have COPD include:   · Inhaler and nebulizer medicines. These help manage the symptoms of COPD and make your breathing more comfortable  · Supplemental oxygen. Supplemental oxygen is only helpful if you have a low oxygen level in your blood.    · Exercise and physical activity. These are beneficial for nearly all people with COPD. Some people may also benefit from a pulmonary rehabilitation program.  HOME CARE INSTRUCTIONS   · Take all medicines (inhaled or pills) as directed by your health care provider.  · Only take over-the-counter or prescription medicines  for pain, fever, or discomfort as directed by your health care provider.    · Avoid over-the-counter medicines or cough syrups that dry up your airway (such as antihistamines) and slow down the elimination of secretions unless instructed otherwise by your healthcare provider.    · If you are a smoker, the most important thing that you can do is stop smoking. Continuing to smoke will cause further lung damage and breathing trouble. Ask your health care provider for help with quitting smoking. He or she can direct you to community resources or hospitals that provide support.  · Avoid exposure to irritants such as smoke, chemicals, and fumes that aggravate your breathing.  · Use oxygen therapy and pulmonary rehabilitation if directed by your health care provider. If you require home oxygen therapy, ask your healthcare provider whether you should purchase a pulse oximeter to measure your oxygen level at home.    · Avoid contact with individuals who have a contagious illness.  · Avoid extreme temperature and humidity changes.  · Eat healthy foods. Eating smaller, more frequent meals and resting before meals may help you maintain your strength.  · Stay active, but balance activity with periods of rest. Exercise and physical activity will help you maintain your ability to do things you want to do.  · Preventing infection and hospitalization is very important when you have COPD. Make sure to receive all the vaccines your health care provider recommends, especially the pneumococcal and influenza vaccines. Ask your healthcare provider whether you   in (inhaling) through your nose for 1 second. Then, purse your lips as if you were going to whistle and breathe out (exhale)  through the pursed lips for 2 seconds.   Diaphragmatic breathing. Start by putting one hand on your abdomen just above your waist. Inhale slowly through your nose. The hand on your abdomen should move out. Then purse your lips and exhale slowly. You should be able to feel the hand on your abdomen moving in as you exhale.   Learn and use controlled coughing to clear mucus from your lungs. Controlled coughing is a series of short, progressive coughs. The steps of controlled coughing are:  1. Lean your head slightly forward.  2. Breathe in deeply using diaphragmatic breathing.  3. Try to hold your breath for 3 seconds.  4. Keep your mouth slightly open while coughing twice.  5. Spit any mucus out into a tissue.  6. Rest and repeat the steps once or twice as needed. SEEK MEDICAL CARE IF:   You are coughing up more mucus than usual.   There is a change in the color or thickness of your mucus.   Your breathing is more labored than usual.   Your breathing is faster than usual.  SEEK IMMEDIATE MEDICAL CARE IF:   You have shortness of breath while you are resting.   You have shortness of breath that prevents you from:  Being able to talk.   Performing your usual physical activities.   You have chest pain lasting longer than 5 minutes.   Your skin color is more cyanotic than usual.  You measure low oxygen saturations for longer than 5 minutes with a pulse oximeter. MAKE SURE YOU:   Understand these instructions.  Will watch your condition.  Will get help right away if you are not doing well or get worse. Document Released: 07/23/2005 Document Revised: 08/03/2013 Document Reviewed: 06/09/2013 [facility] Patient Information 2014 [place], [place].  As discussed, you may not need to fill the prednisone prescription as you have been given a long acting steroid shot here tonight.  You may start taking the prednisone on Thursday morning if your wheezing returns.  Also, use  your inhaler medication, 2 puffs every 4 hours if needed for cough or wheezing.  Return here for any worsened symptoms and plan to followup with Dr Luan Pulling as already planned.
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
## 2014-02-24 NOTE — ED Provider Notes (Signed)
Medical screening examination/treatment/procedure(s) were performed by non-physician practitioner and as supervising physician I was immediately available for consultation/collaboration.    Meliss Fleek R Gwenda Heiner, MD 02/24/14 1542 

## 2014-03-29 ENCOUNTER — Ambulatory Visit: Payer: BC Managed Care – PPO | Admitting: Family Medicine

## 2014-04-11 ENCOUNTER — Other Ambulatory Visit: Payer: Self-pay | Admitting: Family Medicine

## 2014-04-13 ENCOUNTER — Encounter (INDEPENDENT_AMBULATORY_CARE_PROVIDER_SITE_OTHER): Payer: Self-pay

## 2014-04-13 ENCOUNTER — Ambulatory Visit (INDEPENDENT_AMBULATORY_CARE_PROVIDER_SITE_OTHER): Payer: BC Managed Care – PPO | Admitting: Family Medicine

## 2014-04-13 ENCOUNTER — Encounter: Payer: Self-pay | Admitting: Family Medicine

## 2014-04-13 VITALS — BP 138/74 | HR 72 | Resp 16 | Wt 163.0 lb

## 2014-04-13 DIAGNOSIS — I2510 Atherosclerotic heart disease of native coronary artery without angina pectoris: Secondary | ICD-10-CM

## 2014-04-13 DIAGNOSIS — Z91199 Patient's noncompliance with other medical treatment and regimen due to unspecified reason: Secondary | ICD-10-CM

## 2014-04-13 DIAGNOSIS — F17200 Nicotine dependence, unspecified, uncomplicated: Secondary | ICD-10-CM

## 2014-04-13 DIAGNOSIS — I2584 Coronary atherosclerosis due to calcified coronary lesion: Secondary | ICD-10-CM

## 2014-04-13 DIAGNOSIS — J4489 Other specified chronic obstructive pulmonary disease: Secondary | ICD-10-CM

## 2014-04-13 DIAGNOSIS — R918 Other nonspecific abnormal finding of lung field: Secondary | ICD-10-CM

## 2014-04-13 DIAGNOSIS — Z5329 Procedure and treatment not carried out because of patient's decision for other reasons: Secondary | ICD-10-CM

## 2014-04-13 DIAGNOSIS — E785 Hyperlipidemia, unspecified: Secondary | ICD-10-CM

## 2014-04-13 DIAGNOSIS — I10 Essential (primary) hypertension: Secondary | ICD-10-CM

## 2014-04-13 DIAGNOSIS — J449 Chronic obstructive pulmonary disease, unspecified: Secondary | ICD-10-CM

## 2014-04-13 DIAGNOSIS — Z87891 Personal history of nicotine dependence: Secondary | ICD-10-CM

## 2014-04-13 DIAGNOSIS — Z5320 Procedure and treatment not carried out because of patient's decision for unspecified reasons: Secondary | ICD-10-CM

## 2014-04-13 NOTE — Patient Instructions (Addendum)
F/u in 4.5 month, with rectal call if you need me before  Pneumonia vaccine is recommended you need this also a colonoscopy  Your are referred for a 2nd chest scan , and following this you will see Dr Luan Pulling to review your lung disease  EKG today shows no sign of heart damage, however I am still referring you to have the cardiologist  Evaluate based on our discusssion,    Fasting lipid, cmp in 4.5 month

## 2014-04-13 NOTE — Assessment & Plan Note (Addendum)
Increased CAD risk factors are  longstanding nicotine use , quit in 01/2014, also sister had coronary stents palced in her 66's . Currently asymptomatic ,  and EKG shows no ischemia, will however refer to cardiology for furhter eval deemed necessary Co morbidities anre HTN and hyperlipidemia, both controlled

## 2014-04-16 DIAGNOSIS — Z91199 Patient's noncompliance with other medical treatment and regimen due to unspecified reason: Secondary | ICD-10-CM | POA: Insufficient documentation

## 2014-04-16 DIAGNOSIS — Z5329 Procedure and treatment not carried out because of patient's decision for other reasons: Secondary | ICD-10-CM | POA: Insufficient documentation

## 2014-04-16 NOTE — Assessment & Plan Note (Signed)
Reports improvement in breathing and wellbeing since discontinuing nicotine 2.5 month ago

## 2014-04-16 NOTE — Progress Notes (Signed)
   Subjective:    Patient ID: Dillon [name], male    DOB: 04-Oct-1951, 63 y.o.   MRN: [id number]  HPI The PT is here for follow up and re-evaluation of chronic medical conditions, medication management and review of any available recent lab and radiology data.  Preventive health is updated, specifically  Cancer screening and Immunization.  Refused necessary and planned services today at visit Questions or concerns regarding consultations or procedures which the PT has had in the interim are  addressed. The PT denies any adverse reactions to current medications since the last visit.  Continues to be nicotine free , and very concerned about pulmonary follow up    Review of Systems See HPI Denies recent fever or chills. Denies sinus pressure, nasal congestion, ear pain or sore throat. Denies chest congestion, productive cough or wheezing. Denies chest pains, palpitations and leg swelling, recent pET scan shows CAD and his sister had stents placed in her coronary artery(ies) in her 50';s Denies abdominal pain, nausea, vomiting,diarrhea or constipation. Denies change in BM, no known f/h of colon ca  Denies dysuria, frequency, hesitancy or incontinence. Denies joint pain, swelling and limitation in mobility. Denies headaches, seizures, numbness, or tingling. Denies depression, anxiety or insomnia. Denies skin break down or rash.        Objective:   Physical Exam BP 138/74  Pulse 72  Resp 16  Wt 163 lb (73.936 kg)  SpO2 96% Patient alert and oriented and in no cardiopulmonary distress.  HEENT: No facial asymmetry, EOMI,   oropharynx pink and moist.  Neck supple no JVD, no mass.  Chest: Clear to auscultation bilaterally.Decreased air entry throughout  CVS: S1, S2 no murmurs, no S3.  ABD: Soft non tender.   Ext: No edema  MS: Adequate ROM spine, shoulders, hips and knees.  Skin: Intact, no ulcerations or rash noted.  Psych: Good eye contact, normal affect. Memory intact not  anxious or depressed appearing.  CNS: CN 2-12 intact, power,  normal throughout.no focal deficits noted.        Assessment & Plan:  CAD (coronary atherosclerotic disease) Increased CAD risk factors are  longstanding nicotine use , quit in 01/2014, also sister had coronary stents palced in her 67's . Currently asymptomatic ,  and EKG shows no ischemia, will however refer to cardiology for furhter eval deemed necessary Co morbidities anre HTN and hyperlipidemia, both controlled  HYPERTENSION Controlled, no change in medication DASH diet and commitment to daily physical activity for a minimum of 30 minutes discussed and encouraged, as a part of hypertension management.    OTHER AND UNSPECIFIED HYPERLIPIDEMIA Controlled, no change in medication Hyperlipidemia:Low fat diet discussed and encouraged.  Updated lab needed at/ before next visit.   NICOTINE ADDICTION Applauded on continued cessation of nicotine use. He is being treated/ evaluated by pulmonary both for his COPD as well as because of a recent abnormal chest scan, needs rept scan in July and then will f/u with pulmonary after this, states he has not been re evaluated with pulmonary since PET scan which was order though he attempted to be seen, will send a message to pulmonary Doc as well as refer again fo f/u  COPD Reports improvement in breathing and wellbeing since discontinuing nicotine 2.5 month ago  Patient non-compliant, refused service Scheduled for rectal exam for colon screening , refused , states will do "next time" Pneumovac #13 offered , he refused , states "next time"

## 2014-04-16 NOTE — Assessment & Plan Note (Signed)
Applauded on continued cessation of nicotine use. He is being treated/ evaluated by pulmonary both for his COPD as well as because of a recent abnormal chest scan, needs rept scan in July and then will f/u with pulmonary after this, states he has not been re evaluated with pulmonary since PET scan which was order though he attempted to be seen, will send a message to pulmonary Doc as well as refer again fo f/u

## 2014-04-16 NOTE — Assessment & Plan Note (Signed)
Scheduled for rectal exam for colon screening , refused , states will do "next time" Pneumovac #13 offered , he refused , states "next time"

## 2014-04-16 NOTE — Assessment & Plan Note (Signed)
Controlled, no change in medication DASH diet and commitment to daily physical activity for a minimum of 30 minutes discussed and encouraged, as a part of hypertension management.

## 2014-04-16 NOTE — Assessment & Plan Note (Signed)
Controlled, no change in medication Hyperlipidemia:Low fat diet discussed and encouraged.  Updated lab needed at/ before next visit.  

## 2014-04-20 ENCOUNTER — Encounter: Payer: Self-pay | Admitting: Family Medicine

## 2014-05-18 ENCOUNTER — Ambulatory Visit (HOSPITAL_COMMUNITY)
Admission: RE | Admit: 2014-05-18 | Discharge: 2014-05-18 | Disposition: A | Discharge status: Home / Self Care | Payer: BC Managed Care – PPO | Source: Ambulatory Visit | Attending: Family Medicine | Admitting: Family Medicine

## 2014-05-18 DIAGNOSIS — R911 Solitary pulmonary nodule: Secondary | ICD-10-CM | POA: Insufficient documentation

## 2014-05-18 DIAGNOSIS — J449 Chronic obstructive pulmonary disease, unspecified: Secondary | ICD-10-CM

## 2014-05-18 DIAGNOSIS — J45909 Unspecified asthma, uncomplicated: Secondary | ICD-10-CM | POA: Insufficient documentation

## 2014-05-18 LAB — LIPID PANEL
Cholesterol: 136 mg/dL (ref 0–200)
HDL: 46 mg/dL (ref >39)
LDL Cholesterol: 50 mg/dL (ref 0–99)
Total CHOL/HDL Ratio: 3 Ratio
Triglycerides: 200 mg/dL — ABNORMAL HIGH (ref <150)
VLDL: 40 mg/dL (ref 0–40)

## 2014-05-18 LAB — COMPREHENSIVE METABOLIC PANEL
ALT: 9 U/L (ref 0–53)
AST: 16 U/L (ref 0–37)
Albumin: 3.9 g/dL (ref 3.5–5.2)
Alkaline Phosphatase: 63 U/L (ref 39–117)
BUN: 11 mg/dL (ref 6–23)
CO2: 26 mEq/L (ref 19–32)
Calcium: 9 mg/dL (ref 8.4–10.5)
Chloride: 108 mEq/L (ref 96–112)
Creat: 0.93 mg/dL (ref 0.50–1.35)
Glucose, Bld: 96 mg/dL (ref 70–99)
Potassium: 3.6 mEq/L (ref 3.5–5.3)
Sodium: 143 mEq/L (ref 135–145)
Total Bilirubin: 0.2 mg/dL (ref 0.2–1.2)
Total Protein: 6.3 g/dL (ref 6.0–8.3)

## 2014-05-19 ENCOUNTER — Ambulatory Visit (INDEPENDENT_AMBULATORY_CARE_PROVIDER_SITE_OTHER): Payer: BC Managed Care – PPO | Admitting: Cardiology

## 2014-05-19 ENCOUNTER — Encounter: Payer: Self-pay | Admitting: Cardiology

## 2014-05-19 VITALS — BP 141/87 | HR 64 | Ht 74.0 in | Wt 165.0 lb

## 2014-05-19 DIAGNOSIS — E785 Hyperlipidemia, unspecified: Secondary | ICD-10-CM

## 2014-05-19 DIAGNOSIS — Z720 Tobacco use: Secondary | ICD-10-CM

## 2014-05-19 DIAGNOSIS — I10 Essential (primary) hypertension: Secondary | ICD-10-CM

## 2014-05-19 DIAGNOSIS — F17200 Nicotine dependence, unspecified, uncomplicated: Secondary | ICD-10-CM

## 2014-05-19 DIAGNOSIS — I2510 Atherosclerotic heart disease of native coronary artery without angina pectoris: Secondary | ICD-10-CM

## 2014-05-19 NOTE — Patient Instructions (Signed)
Your physician recommends that you schedule a follow-up appointment in: 1 year with Dr Bryna Colander will receive a reminder letter two months in advance reminding you to call and schedule your appointment. If you don't receive this letter, please contact our office.  Your physician has requested that you have an exercise tolerance test. For further information please visit [email or web address]. Please also follow instruction sheet, as given.  DASH Eating Plan DASH stands for "Dietary Approaches to Stop Hypertension." The DASH eating plan is a healthy eating plan that has been shown to reduce high blood pressure (hypertension). Additional health benefits may include reducing the risk of type 2 diabetes mellitus, heart disease, and stroke. The DASH eating plan may also help with weight loss. WHAT DO I NEED TO KNOW ABOUT THE DASH EATING PLAN? For the DASH eating plan, you will follow these general guidelines:  Choose foods with a percent daily value for sodium of less than 5% (as listed on the food label).  Use salt-free seasonings or herbs instead of table salt or sea salt.  Check with your health care provider or pharmacist before using salt substitutes.  Eat lower-sodium products, often labeled as "lower sodium" or "no salt added."  Eat fresh foods.  Eat more vegetables, fruits, and low-fat dairy products.  Choose whole grains. Look for the word "whole" as the first word in the ingredient list.  Choose fish and skinless chicken or [place] more often than red meat. Limit fish, poultry, and meat to 6 oz (170 g) each day.  Limit sweets, desserts, sugars, and sugary drinks.  Choose heart-healthy fats.  Limit cheese to 1 oz (28 g) per day.  Eat more home-cooked food and less restaurant, buffet, and fast food.  Limit fried foods.  Cook foods using methods other than frying.  Limit canned vegetables. If you do use them, rinse them well to decrease the sodium.  When eating at a restaurant,  ask that your food be prepared with less salt, or no salt if possible. WHAT FOODS CAN I EAT? Seek help from a dietitian for individual calorie needs. Grains Whole grain or whole wheat bread. Brown rice. Whole grain or whole wheat pasta. Quinoa, bulgur, and whole grain cereals. Low-sodium cereals. Corn or whole wheat flour tortillas. Whole grain cornbread. Whole grain crackers. Low-sodium crackers. Vegetables Fresh or frozen vegetables (raw, steamed, roasted, or grilled). Low-sodium or reduced-sodium tomato and vegetable juices. Low-sodium or reduced-sodium tomato sauce and paste. Low-sodium or reduced-sodium canned vegetables.  Fruits All fresh, canned (in natural juice), or frozen fruits. Meat and Other Protein Products Ground beef (85% or leaner), grass-fed beef, or beef trimmed of fat. Skinless chicken or [place]. Ground chicken or [place]. Pork trimmed of fat. All fish and seafood. Eggs. Dried beans, peas, or lentils. Unsalted nuts and seeds. Unsalted canned beans. Dairy Low-fat dairy products, such as skim or 1% milk, 2% or reduced-fat cheeses, low-fat ricotta or cottage cheese, or plain low-fat yogurt. Low-sodium or reduced-sodium cheeses. Fats and Oils Tub margarines without trans fats. Light or reduced-fat mayonnaise and salad dressings (reduced sodium). Avocado. Safflower, olive, or canola oils. Natural peanut or almond butter. Other Unsalted popcorn and pretzels. The items listed above may not be a complete list of recommended foods or beverages. Contact your dietitian for more options. WHAT FOODS ARE NOT RECOMMENDED? Grains White bread. White pasta. White rice. Refined cornbread. Bagels and croissants. Crackers that contain trans fat. Vegetables Creamed or fried vegetables. Vegetables in a cheese sauce. Regular canned vegetables. Regular canned  tomato sauce and paste. Regular tomato and vegetable juices. Fruits Dried fruits. Canned fruit in light or heavy syrup. Fruit juice. Meat  and Other Protein Products Fatty cuts of meat. Ribs, chicken wings, bacon, sausage, bologna, salami, chitterlings, fatback, hot dogs, bratwurst, and packaged luncheon meats. Salted nuts and seeds. Canned beans with salt. Dairy Whole or 2% milk, cream, half-and-half, and cream cheese. Whole-fat or sweetened yogurt. Full-fat cheeses or blue cheese. Nondairy creamers and whipped toppings. Processed cheese, cheese spreads, or cheese curds. Condiments Onion and garlic salt, seasoned salt, table salt, and sea salt. Canned and packaged gravies. Worcestershire sauce. Tartar sauce. Barbecue sauce. Teriyaki sauce. Soy sauce, including reduced sodium. Steak sauce. Fish sauce. Oyster sauce. Cocktail sauce. Horseradish. Ketchup and mustard. Meat flavorings and tenderizers. Bouillon cubes. Hot sauce. Tabasco sauce. Marinades. Taco seasonings. Relishes. Fats and Oils Butter, stick margarine, lard, shortening, ghee, and bacon fat. Coconut, palm kernel, or palm oils. Regular salad dressings. Other Pickles and olives. Salted popcorn and pretzels. The items listed above may not be a complete list of foods and beverages to avoid. Contact your dietitian for more information. WHERE CAN I FIND MORE INFORMATION? National Heart, Lung, and Blood Institute: travelstabloid.com Document Released: 10/02/2011 Document Revised: 02/27/2014 Document Reviewed: 08/17/2013 [facility] Patient Information 2015 [place], [place]. This information is not intended to replace advice given to you by your health care provider. Make sure you discuss any questions you have with your health care provider.

## 2014-05-19 NOTE — Progress Notes (Signed)
Clinical Summary Dillon [name] is a 63 y.o.male seen today as a new patient.Marland Kitchen  1.CAD - CT scan chest 01/2014 with incidental finding of coronary calcification - denies any chest pain. Denies any DOE, highest level of activity is heavy yard work. - denies any LE, no orthopnea, no PND  CAD risk factors: HTN, tobacco, FH with sister stents in her 34s, hyperlipidemia  2. HTN - checks bp at home 3-4 times a week. Typically around 140/70s. - compliant with meds  3. Tobacco - just occasional use, approx twice a week, few puffs off single cigarette   4. HL - last panel 04/2014 TC 136 TG 200 HDL 46 LDL 50 - compliant with statin Past Medical History  Diagnosis Date  . Nicotine addiction   . Hypertension   . Asthma   . COPD (chronic obstructive pulmonary disease)      Allergies  Allergen Reactions  . Penicillins      Current Outpatient Prescriptions  Medication Sig Dispense Refill  . albuterol (PROVENTIL HFA;VENTOLIN HFA) 108 (90 BASE) MCG/ACT inhaler Inhale 2 puffs into the lungs every 6 (six) hours as needed for wheezing.  1 Inhaler  3  . albuterol (PROVENTIL) (2.5 MG/3ML) 0.083% nebulizer solution Take 2.5 mg by nebulization every 6 (six) hours as needed for wheezing or shortness of breath.      Marland Kitchen albuterol (VENTOLIN HFA) 108 (90 BASE) MCG/ACT inhaler Inhale into the lungs every 6 (six) hours as needed for wheezing or shortness of breath.      Marland Kitchen amLODipine (NORVASC) 10 MG tablet Take 1 tablet (10 mg total) by mouth daily.  30 tablet  5  . aspirin EC 81 MG tablet Take 81 mg by mouth daily.      . budesonide-formoterol (SYMBICORT) 160-4.5 MCG/ACT inhaler Inhale 2 puffs into the lungs 2 (two) times daily.  1 Inhaler  4  . pravastatin (PRAVACHOL) 40 MG tablet TAKE 1 TABLET (40 MG TOTAL) BY MOUTH EVERY EVENING.  30 tablet  7   No current facility-administered medications for this visit.     Past Surgical History  Procedure Laterality Date  . Knee arthroscopy      both  knees      Allergies  Allergen Reactions  . Penicillins       Family History  Problem Relation Age of Onset  . Heart attack Mother   . Diabetes Mother   . Hypertension Mother   . Hypertension Sister   . Hypertension Brother      Social History Dillon [name] reports that he has quit smoking. His smoking use included Cigarettes. He smoked 0.50 packs per day. He does not have any smokeless tobacco history on file. Dillon [name] reports that he does not drink alcohol.   Review of Systems CONSTITUTIONAL: No weight loss, fever, chills, weakness or fatigue.  HEENT: Eyes: No visual loss, blurred vision, double vision or yellow sclerae.No hearing loss, sneezing, congestion, runny nose or sore throat.  SKIN: No rash or itching.  CARDIOVASCULAR: per HPI RESPIRATORY: No shortness of breath, cough or sputum.  GASTROINTESTINAL: No anorexia, nausea, vomiting or diarrhea. No abdominal pain or blood.  GENITOURINARY: No burning on urination, no polyuria NEUROLOGICAL: No headache, dizziness, syncope, paralysis, ataxia, numbness or tingling in the extremities. No change in bowel or bladder control.  MUSCULOSKELETAL: No muscle, back pain, joint pain or stiffness.  LYMPHATICS: No enlarged nodes. No history of splenectomy.  PSYCHIATRIC: No history of depression or anxiety.  ENDOCRINOLOGIC: No reports  of sweating, cold or heat intolerance. No polyuria or polydipsia.  Marland Kitchen   Physical Examination p 64 bp 141/87 Wt 165 lbs BMI 21 Gen: resting comfortably, no acute distress HEENT: no scleral icterus, pupils equal round and reactive, no palptable cervical adenopathy,  CV: RRR, no m/r/g,no JVD, no carotid bruits Resp: Clear to auscultation bilaterally GI: abdomen is soft, non-tender, non-distended, normal bowel sounds, no hepatosplenomegaly MSK: extremities are warm, no edema.  Skin: warm, no rash Neuro:  no focal deficits Psych: appropriate affect   Diagnostic Studies 03/2014  EKG NSR   Assessment and Plan  1. CAD - incidental findings of coronary atherosclerosis on recent chest CT - patient with multiple CAD risk factors - denies any current symptoms - will obtain exercise stress test to further risk stratify and evaluate functionality of visualized coronary lesions - continue aggressive risk factor modificaiton  2. HTN - higher end of goal, continue current meds. Given information of DASH diet  3. Hyperlipidemia - at goal, given detected CAD goal LDL <70 - continue current statin  4. Tobacco abuse - smokes rarely, counseled on health risks and advised to quit    F/u 1 year  Arnoldo Lenis, M.D., F.A.C.C.

## 2014-05-25 ENCOUNTER — Inpatient Hospital Stay (HOSPITAL_COMMUNITY): Admission: RE | Admit: 2014-05-25 | Payer: BC Managed Care – PPO | Source: Ambulatory Visit

## 2014-06-28 ENCOUNTER — Other Ambulatory Visit: Payer: Self-pay | Admitting: Family Medicine

## 2014-09-14 ENCOUNTER — Ambulatory Visit (INDEPENDENT_AMBULATORY_CARE_PROVIDER_SITE_OTHER): Payer: BC Managed Care – PPO | Admitting: Family Medicine

## 2014-09-14 ENCOUNTER — Encounter: Payer: Self-pay | Admitting: Family Medicine

## 2014-09-14 VITALS — BP 140/78 | HR 82 | Resp 16 | Ht 74.0 in | Wt 174.0 lb

## 2014-09-14 DIAGNOSIS — Z5329 Procedure and treatment not carried out because of patient's decision for other reasons: Secondary | ICD-10-CM

## 2014-09-14 DIAGNOSIS — Z1211 Encounter for screening for malignant neoplasm of colon: Secondary | ICD-10-CM

## 2014-09-14 DIAGNOSIS — Z720 Tobacco use: Secondary | ICD-10-CM

## 2014-09-14 DIAGNOSIS — E785 Hyperlipidemia, unspecified: Secondary | ICD-10-CM

## 2014-09-14 DIAGNOSIS — Z91199 Patient's noncompliance with other medical treatment and regimen due to unspecified reason: Secondary | ICD-10-CM

## 2014-09-14 DIAGNOSIS — I10 Essential (primary) hypertension: Secondary | ICD-10-CM

## 2014-09-14 DIAGNOSIS — F17200 Nicotine dependence, unspecified, uncomplicated: Secondary | ICD-10-CM

## 2014-09-14 DIAGNOSIS — M5410 Radiculopathy, site unspecified: Secondary | ICD-10-CM | POA: Insufficient documentation

## 2014-09-14 LAB — COMPREHENSIVE METABOLIC PANEL
ALT: 13 U/L (ref 0–53)
AST: 24 U/L (ref 0–37)
Albumin: 3.9 g/dL (ref 3.5–5.2)
Alkaline Phosphatase: 61 U/L (ref 39–117)
BUN: 18 mg/dL (ref 6–23)
CO2: 24 mEq/L (ref 19–32)
Calcium: 8.9 mg/dL (ref 8.4–10.5)
Chloride: 105 mEq/L (ref 96–112)
Creat: 0.88 mg/dL (ref 0.50–1.35)
Glucose, Bld: 99 mg/dL (ref 70–99)
Potassium: 4 mEq/L (ref 3.5–5.3)
Sodium: 140 mEq/L (ref 135–145)
Total Bilirubin: 0.3 mg/dL (ref 0.2–1.2)
Total Protein: 6.3 g/dL (ref 6.0–8.3)

## 2014-09-14 LAB — POC HEMOCCULT BLD/STL (OFFICE/1-CARD/DIAGNOSTIC): Fecal Occult Blood, POC: NEGATIVE

## 2014-09-14 LAB — LIPID PANEL
Cholesterol: 137 mg/dL (ref 0–200)
HDL: 44 mg/dL (ref >39)
LDL Cholesterol: 70 mg/dL (ref 0–99)
Total CHOL/HDL Ratio: 3.1 Ratio
Triglycerides: 117 mg/dL (ref <150)
VLDL: 23 mg/dL (ref 0–40)

## 2014-09-14 MED ORDER — IBUPROFEN 800 MG PO TABS: 800.0000 mg | ORAL_TABLET | Freq: Three times a day (TID) | ORAL | Status: DC | PRN

## 2014-09-14 MED ORDER — PREDNISONE (PAK) 5 MG PO TABS: 5.0000 mg | ORAL_TABLET | ORAL | Status: DC

## 2014-09-14 MED ORDER — KETOROLAC TROMETHAMINE 60 MG/2ML IM SOLN
60.0000 mg | Freq: Once | INTRAMUSCULAR | Status: AC
  Administered 2014-09-14: 60 mg via INTRAMUSCULAR

## 2014-09-14 MED ORDER — METHYLPREDNISOLONE ACETATE 80 MG/ML IJ SUSP
80.0000 mg | Freq: Once | INTRAMUSCULAR | Status: AC
  Administered 2014-09-14: 80 mg via INTRAMUSCULAR

## 2014-09-14 MED ORDER — METHOCARBAMOL 750 MG PO TABS: ORAL_TABLET | ORAL | Status: DC

## 2014-09-14 NOTE — Patient Instructions (Addendum)
F/u in 5. month, call if you need mee before  Fasting lipid and cmp today  Injections in office today for pain, and medication sent to pharmacy , also script for topical rub to use at night Take ES tylenol one every day  for pain  Continue to work on stopping smoking, you are almost there!   BP needs to be a bit better, follow the changes discussed and on paper Return for flu vaccine if you decide

## 2014-09-14 NOTE — Assessment & Plan Note (Signed)
Uncontrolled DASH diet and commitment to daily physical activity for a minimum of 30 minutes discussed and encouraged, as a part of hypertension management. The importance of attaining a healthy weight is also discussed.

## 2014-09-14 NOTE — Progress Notes (Signed)
   Subjective:    Patient ID: Dillon [name], male    DOB: January 27, 1951, 63 y.o.   MRN: [id number]  HPI The PT is here for follow up and re-evaluation of chronic medical conditions, medication management and review of any available recent lab and radiology data.  Preventive health is updated, specifically  Cancer screening and Immunization.   2 month h/o increased arthritic pain affecting back, shoulders , knees, aggravated by work, some relief with massage and warm bath Committed to more regular bike riding and has stopped smoking    Review of Systems See HPI Denies recent fever or chills. Denies sinus pressure, nasal congestion, ear pain or sore throat. Denies chest congestion, productive cough or wheezing. Denies chest pains, palpitations and leg swelling Denies abdominal pain, nausea, vomiting,diarrhea or constipation.   Denies dysuria, frequency, hesitancy or incontinence. Denies headaches, seizures, numbness, or tingling. Denies depression, anxiety or insomnia. Denies skin break down or rash.        Objective:   Physical Exam BP 140/78 mmHg  Pulse 82  Resp 16  Ht 6\' 2"  (1.88 m)  Wt 174 lb (78.926 kg)  BMI 22.33 kg/m2  SpO2 98% Patient alert and oriented and in no cardiopulmonary distress.  HEENT: No facial asymmetry, EOMI,   oropharynx pink and moist.  Neck supple no JVD, no mass.  Chest: Clear to auscultation bilaterally.Decreased air entry throughout  CVS: S1, S2 no murmurs, no S3.Regular rate.  ABD: Soft non tender.No organomegaly or mass. Prostate firm and smooth, heme negative stool  Ext: No edema  MS: Adequate though reduced  ROM spine, shoulders, hips and knees.  Skin: Intact, no ulcerations or rash noted.  Psych: Good eye contact, normal affect. Memory intact not anxious or depressed appearing.  CNS: CN 2-12 intact, power,  normal throughout.no focal deficits noted.        Assessment & Plan:  Essential hypertension Uncontrolled DASH diet and  commitment to daily physical activity for a minimum of 30 minutes discussed and encouraged, as a part of hypertension management. The importance of attaining a healthy weight is also discussed.   Back pain with left-sided radiculopathy Increased and uncontrolled, anti inflammatories in office annd meds prescribed, pt encouraged to keep active  Hyperlipidemia Controlled, no change in medication Hyperlipidemia:Low fat diet discussed and encouraged.    Patient non-compliant, refused service Refuses flu vaccine today  Special screening for malignant neoplasms, colon Rectal exam yields heme negative stool  NICOTINE ADDICTION ingteremittent nicotine use , still has not entirely quit but is working towards this Patient counseled for approximately 5 minutes regarding the health risks of ongoing nicotine use, specifically all types of cancer, heart disease, stroke and respiratory failure. The options available for help with cessation ,the behavioral changes to assist the process, and the option to either gradully reduce usage  Or abruptly stop.is also discussed. Pt is also encouraged to set specific goals in number of cigarettes used daily, as well as to set a quit date.

## 2014-09-15 DIAGNOSIS — Z1211 Encounter for screening for malignant neoplasm of colon: Secondary | ICD-10-CM | POA: Insufficient documentation

## 2014-09-15 NOTE — Assessment & Plan Note (Signed)
Controlled, no change in medication Hyperlipidemia:Low fat diet discussed and encouraged.  \ 

## 2014-09-15 NOTE — Assessment & Plan Note (Signed)
Rectal exam yields heme negative stool

## 2014-09-15 NOTE — Assessment & Plan Note (Signed)
Refuses flu vaccine today

## 2014-09-15 NOTE — Assessment & Plan Note (Signed)
Increased and uncontrolled, anti inflammatories in office annd meds prescribed, pt encouraged to keep active

## 2014-09-17 ENCOUNTER — Encounter: Payer: Self-pay | Admitting: Family Medicine

## 2014-09-17 NOTE — Assessment & Plan Note (Signed)
ingteremittent nicotine use , still has not entirely quit but is working towards this Patient counseled for approximately 5 minutes regarding the health risks of ongoing nicotine use, specifically all types of cancer, heart disease, stroke and respiratory failure. The options available for help with cessation ,the behavioral changes to assist the process, and the option to either gradully reduce usage  Or abruptly stop.is also discussed. Pt is also encouraged to set specific goals in number of cigarettes used daily, as well as to set a quit date.

## 2015-01-07 ENCOUNTER — Other Ambulatory Visit: Payer: Self-pay | Admitting: Family Medicine

## 2015-01-30 ENCOUNTER — Other Ambulatory Visit (HOSPITAL_COMMUNITY): Payer: Self-pay | Admitting: Orthopaedic Surgery

## 2015-01-30 ENCOUNTER — Telehealth: Payer: Self-pay

## 2015-01-30 DIAGNOSIS — M545 Low back pain, unspecified: Secondary | ICD-10-CM

## 2015-01-30 MED ORDER — LORAZEPAM 1 MG PO TABS: ORAL_TABLET | ORAL | Status: DC

## 2015-01-30 NOTE — Telephone Encounter (Signed)
Ativan printed, -[psl send and explain, also remind him to keep his 4/13 appt here pls

## 2015-01-30 NOTE — Telephone Encounter (Signed)
Patient aware and med sent  

## 2015-02-02 ENCOUNTER — Ambulatory Visit (HOSPITAL_COMMUNITY)
Admission: RE | Admit: 2015-02-02 | Discharge: 2015-02-02 | Disposition: A | Discharge status: Home / Self Care | Payer: BLUE CROSS/BLUE SHIELD | Source: Ambulatory Visit | Attending: Orthopaedic Surgery | Admitting: Orthopaedic Surgery

## 2015-02-02 DIAGNOSIS — M545 Low back pain, unspecified: Secondary | ICD-10-CM

## 2015-02-07 ENCOUNTER — Ambulatory Visit (INDEPENDENT_AMBULATORY_CARE_PROVIDER_SITE_OTHER): Payer: BLUE CROSS/BLUE SHIELD | Admitting: Family Medicine

## 2015-02-07 ENCOUNTER — Encounter: Payer: Self-pay | Admitting: Family Medicine

## 2015-02-07 VITALS — BP 130/78 | HR 73 | Resp 16 | Ht 74.0 in | Wt 176.1 lb

## 2015-02-07 DIAGNOSIS — R972 Elevated prostate specific antigen [PSA]: Secondary | ICD-10-CM

## 2015-02-07 DIAGNOSIS — Z720 Tobacco use: Secondary | ICD-10-CM

## 2015-02-07 DIAGNOSIS — E785 Hyperlipidemia, unspecified: Secondary | ICD-10-CM

## 2015-02-07 DIAGNOSIS — M5410 Radiculopathy, site unspecified: Secondary | ICD-10-CM | POA: Diagnosis not present

## 2015-02-07 DIAGNOSIS — I10 Essential (primary) hypertension: Secondary | ICD-10-CM

## 2015-02-07 DIAGNOSIS — Z125 Encounter for screening for malignant neoplasm of prostate: Secondary | ICD-10-CM

## 2015-02-07 DIAGNOSIS — F17200 Nicotine dependence, unspecified, uncomplicated: Secondary | ICD-10-CM

## 2015-02-07 DIAGNOSIS — J449 Chronic obstructive pulmonary disease, unspecified: Secondary | ICD-10-CM

## 2015-02-07 LAB — COMPLETE METABOLIC PANEL WITH GFR
ALT: 19 U/L (ref 0–53)
AST: 25 U/L (ref 0–37)
Albumin: 4 g/dL (ref 3.5–5.2)
Alkaline Phosphatase: 59 U/L (ref 39–117)
BUN: 20 mg/dL (ref 6–23)
CO2: 28 mEq/L (ref 19–32)
Calcium: 9.2 mg/dL (ref 8.4–10.5)
Chloride: 102 mEq/L (ref 96–112)
Creat: 0.96 mg/dL (ref 0.50–1.35)
GFR, Est African American: >89 mL/min
GFR, Est Non African American: 84 mL/min
Glucose, Bld: 84 mg/dL (ref 70–99)
Potassium: 4.2 mEq/L (ref 3.5–5.3)
Sodium: 139 mEq/L (ref 135–145)
Total Bilirubin: 0.3 mg/dL (ref 0.2–1.2)
Total Protein: 6.5 g/dL (ref 6.0–8.3)

## 2015-02-07 LAB — LIPID PANEL
Cholesterol: 164 mg/dL (ref 0–200)
HDL: 34 mg/dL — ABNORMAL LOW (ref >=40)
LDL Cholesterol: 102 mg/dL — ABNORMAL HIGH (ref 0–99)
Total CHOL/HDL Ratio: 4.8 Ratio
Triglycerides: 140 mg/dL (ref <150)
VLDL: 28 mg/dL (ref 0–40)

## 2015-02-07 LAB — CBC
HCT: 39.5 % (ref 39.0–52.0)
Hemoglobin: 13.4 g/dL (ref 13.0–17.0)
MCH: 31.1 pg (ref 26.0–34.0)
MCHC: 33.9 g/dL (ref 30.0–36.0)
MCV: 91.6 fL (ref 78.0–100.0)
MPV: 9.1 fL (ref 8.6–12.4)
Platelets: 349 10*3/uL (ref 150–400)
RBC: 4.31 MIL/uL (ref 4.22–5.81)
RDW: 14.4 % (ref 11.5–15.5)
WBC: 6.2 10*3/uL (ref 4.0–10.5)

## 2015-02-07 LAB — TSH: TSH: 2.509 u[IU]/mL (ref 0.350–4.500)

## 2015-02-07 MED ORDER — GABAPENTIN 300 MG PO CAPS: 300.0000 mg | ORAL_CAPSULE | Freq: Every day | ORAL | Status: DC

## 2015-02-07 NOTE — Assessment & Plan Note (Signed)
Uncontrolled, out of work and seeing ortho x 2 weeks, likely needs surgery based on pain and MRI report. Gabapentin added today by me, chronic pain meds are from Dr Luna Glasgow

## 2015-02-07 NOTE — Assessment & Plan Note (Signed)

## 2015-02-07 NOTE — Assessment & Plan Note (Signed)
Controlled, no change in medication DASH diet and commitment to daily physical activity for a minimum of 30 minutes discussed and encouraged, as a part of hypertension management. The importance of attaining a healthy weight is also discussed.  BP/Weight 02/07/2015 02/02/2015 09/14/2014 05/19/2014 04/13/2014 02/21/2014 01/01/6577  Systolic BP 469 - 629 528 413 244 010  Diastolic BP 78 - 78 87 74 82 92  Wt. (Lbs) 176.12 181 174 165 163 181 172  BMI 22.6 23.23 22.33 21.18 20.65 22.94 22.07

## 2015-02-07 NOTE — Progress Notes (Signed)
Dillon [name]     MRN: [id number]      DOB: November 14, 1950   HPI Dillon [name] is here for follow up and re-evaluation of chronic medical conditions, medication management and review of any available recent lab and radiology data.  Preventive health is updated, specifically  Cancer screening and Immunization.   . The PT denies any adverse reactions to current medications since the last visit.  Has been out of work since the end of March due to uncontrolled back and left lower ext pain, recent MRI lumbar ordered by ortho shows significant arthritis and possible nerve compression ROS Denies recent fever or chills. Denies sinus pressure, nasal congestion, ear pain or sore throat. Denies chest congestion, productive cough or wheezing. Denies chest pains, palpitations and leg swelling Denies abdominal pain, nausea, vomiting,diarrhea or constipation.   Denies dysuria, frequency, hesitancy or incontinence. . Denies headaches, seizures, numbness, or tingling. Denies depression, anxiety some  Insomnia due to, pain Denies skin break down or rash.   PE  BP 130/78 mmHg  Pulse 73  Resp 16  Ht 6\' 2"  (1.88 m)  Wt 176 lb 1.9 oz (79.888 kg)  BMI 22.60 kg/m2  SpO2 98%  Patient alert and oriented and in no cardiopulmonary distress.  HEENT: No facial asymmetry, EOMI,   oropharynx pink and moist.  Neck supple no JVD, no mass.  Chest: Clear to auscultation bilaterally.  CVS: S1, S2 no murmurs, no S3.Regular rate.  ABD: Soft non tender.   Ext: No edema  MS: decreased  ROM lumbar  spine, left  hip and knee.  Skin: Intact, no ulcerations or rash noted.  Psych: Good eye contact, normal affect. Memory intact not anxious or depressed appearing.  CNS: CN 2-12 intact, power,  normal throughout.no focal deficits noted.   Assessment & Plan   Essential hypertension Controlled, no change in medication DASH diet and commitment to daily physical activity for a minimum of 30 minutes discussed and  encouraged, as a part of hypertension management. The importance of attaining a healthy weight is also discussed.  BP/Weight 02/07/2015 02/02/2015 09/14/2014 05/19/2014 04/13/2014 02/21/2014 4/97/0263  Systolic BP 785 - 885 027 741 287 867  Diastolic BP 78 - 78 87 74 82 92  Wt. (Lbs) 176.12 181 174 165 163 181 172  BMI 22.6 23.23 22.33 21.18 20.65 22.94 22.07         Back pain with left-sided radiculopathy Uncontrolled, out of work and seeing ortho x 2 weeks, likely needs surgery based on pain and MRI report. Gabapentin added today by me, chronic pain meds are from Dr Luna Glasgow   NICOTINE ADDICTION Patient counseled for approximately 5 minutes regarding the health risks of ongoing nicotine use, specifically all types of cancer, heart disease, stroke and respiratory failure. The options available for help with cessation ,the behavioral changes to assist the process, and the option to either gradully reduce usage  Or abruptly stop.is also discussed. Pt is also encouraged to set specific goals in number of cigarettes used daily, as well as to set a quit date.  Number of cigarettes/cigars currently smoking daily: 2    COPD (chronic obstructive pulmonary disease) Improved as far as symptoms are concerned, uses daily med and smoking less   Hyperlipidemia Hyperlipidemia:Low fat diet discussed and encouraged.   Lipid Panel  Lab Results  Component Value Date   CHOL 137 09/14/2014   HDL 44 09/14/2014   LDLCALC 70 09/14/2014   TRIG 117 09/14/2014   CHOLHDL 3.1 09/14/2014  Updated lab needed .

## 2015-02-07 NOTE — Assessment & Plan Note (Signed)
Improved as far as symptoms are concerned, uses daily med and smoking less

## 2015-02-07 NOTE — Patient Instructions (Signed)
F/u  In 4 month, call if you need me before  Stop smoking as of today, to help lungs esp since you will likely have surgery  New for pain is gabapentin one at bedtime  labs today, cbc, lipid, cmp, tSH, PSA  All the best , hope you get relief soon for back

## 2015-02-07 NOTE — Assessment & Plan Note (Signed)
Hyperlipidemia:Low fat diet discussed and encouraged.   Lipid Panel  Lab Results  Component Value Date   CHOL 137 09/14/2014   HDL 44 09/14/2014   LDLCALC 70 09/14/2014   TRIG 117 09/14/2014   CHOLHDL 3.1 09/14/2014      Updated lab needed .

## 2015-02-08 LAB — PSA: PSA: 5.17 ng/mL — ABNORMAL HIGH (ref <=4.00)

## 2015-02-09 NOTE — Addendum Note (Signed)
Addended by: Eual Fines on: 02/09/2015 03:19 PM   Modules accepted: Orders

## 2015-02-14 ENCOUNTER — Telehealth: Payer: Self-pay

## 2015-02-14 NOTE — Telephone Encounter (Signed)
Sent of ov note

## 2015-02-14 NOTE — Telephone Encounter (Signed)
pls I need to see Dr De Blanch note before I respond, this is confusing to me,

## 2015-02-19 ENCOUNTER — Telehealth: Payer: Self-pay | Admitting: Family Medicine

## 2015-02-19 NOTE — Telephone Encounter (Signed)
Patient directed to try to get ov note from Dr. Luna Glasgow.  Note from Dr. Hal Neer sent there because he was referred from there.

## 2015-02-22 ENCOUNTER — Encounter: Payer: Self-pay | Admitting: Family Medicine

## 2015-02-22 ENCOUNTER — Ambulatory Visit (INDEPENDENT_AMBULATORY_CARE_PROVIDER_SITE_OTHER): Payer: BLUE CROSS/BLUE SHIELD | Admitting: Family Medicine

## 2015-02-22 VITALS — BP 130/72 | HR 78 | Resp 18 | Ht 74.0 in | Wt 175.0 lb

## 2015-02-22 DIAGNOSIS — R918 Other nonspecific abnormal finding of lung field: Secondary | ICD-10-CM | POA: Insufficient documentation

## 2015-02-22 DIAGNOSIS — I10 Essential (primary) hypertension: Secondary | ICD-10-CM

## 2015-02-22 DIAGNOSIS — Z720 Tobacco use: Secondary | ICD-10-CM

## 2015-02-22 DIAGNOSIS — Z91199 Patient's noncompliance with other medical treatment and regimen due to unspecified reason: Secondary | ICD-10-CM

## 2015-02-22 DIAGNOSIS — I2510 Atherosclerotic heart disease of native coronary artery without angina pectoris: Secondary | ICD-10-CM

## 2015-02-22 DIAGNOSIS — R109 Unspecified abdominal pain: Secondary | ICD-10-CM

## 2015-02-22 DIAGNOSIS — M5410 Radiculopathy, site unspecified: Secondary | ICD-10-CM

## 2015-02-22 DIAGNOSIS — E785 Hyperlipidemia, unspecified: Secondary | ICD-10-CM

## 2015-02-22 DIAGNOSIS — F17208 Nicotine dependence, unspecified, with other nicotine-induced disorders: Secondary | ICD-10-CM

## 2015-02-22 DIAGNOSIS — F17200 Nicotine dependence, unspecified, uncomplicated: Secondary | ICD-10-CM

## 2015-02-22 DIAGNOSIS — Z5329 Procedure and treatment not carried out because of patient's decision for other reasons: Secondary | ICD-10-CM

## 2015-02-22 NOTE — Progress Notes (Signed)
Subjective:    Patient ID: Dillon [name], male    DOB: [birth year], 64 y.o.   MRN: [id number]  HPI Pt in with main c/o of right flank pain. States he has an appt in am with orthopedic Doc who took him out of work due to back and lower  extremity pain, states that he saw a neurosurgeon who advised against the need for surgery to address his pain complaint of back and left lower extremity pain States now his main complaint is of right flank pain, no hematuria, has elevated PSA and prostate enlargement which will be addressed by urology Still smoking, trying to quit    Review of Systems See HPI Denies recent fever or chills. Denies sinus pressure, nasal congestion, ear pain or sore throat. Denies chest congestion, productive cough or wheezing. Denies chest pains, palpitations and leg swelling Denies abdominal pain, nausea, vomiting,diarrhea or constipation.   Denies dysuria, frequency, hesitancy or incontinence.  Denies headaches, seizures, numbness, or tingling. Denies depression, anxiety or insomnia. Denies skin break down or rash.        Objective:   Physical Exam  BP 130/72 mmHg  Pulse 78  Resp 18  Ht 6\' 2"  (1.88 m)  Wt 175 lb 0.6 oz (79.398 kg)  BMI 22.46 kg/m2  SpO2 97% Patient alert and oriented and in no cardiopulmonary distress.  HEENT: No facial asymmetry, EOMI,   oropharynx pink and moist.  Neck supple no JVD, no mass.  Chest: Clear to auscultation bilaterally.Decreased though adequate air entry  CVS: S1, S2 no murmurs, no S3.Regular rate.  ABD: Soft non tender.   Ext: No edema  MS: Decreased  ROM lumbar  Spine, adequate in  shoulders, hips and knees.  Skin: Intact, no ulcerations or rash noted.  Psych: Good eye contact, normal affect. Memory intact not anxious or depressed appearing.  CNS: CN 2-12 intact, power,  normal throughout.no focal deficits noted.       Assessment & Plan:  Bilateral flank pain [place] of kidneyys ordered to further  evalaute flank pain  Today's complaint is of right flank pain   NICOTINE ADDICTION Patient counseled for approximately 5 minutes regarding the health risks of ongoing nicotine use, specifically all types of cancer, heart disease, stroke and respiratory failure. The options available for help with cessation ,the behavioral changes to assist the process, and the option to either gradully reduce usage  Or abruptly stop.is also discussed. Pt is also encouraged to set specific goals in number of cigarettes used daily, as well as to set a quit date.  Has abnormal pET scan and abnormal chest scan in 2015, reports no follow up with pulmonary , will rept chest scan and arrange for same   Lung nodules Needs ongoing follow up with pulmonary, same is arranged   Patient non-compliant, refused service Need for colonoascopy again discussed, pt continues to refuse at this time, encouraged to reconsider, benefit of screening discussed   Back pain with left-sided radiculopathy reports that neurosurgeon told him that his pain will not be helped by surgery. States he has f/u appt with ortho who took him out of work in am, I have advised him to ensure he  Keep f/u there I also told him that based on printed MRI report I still felt that his lower extremity pain is likely  from severe spine problems, however , he will continue that issue with Dr Luna Glasgow  I will also attempt to obtain info from recent ortho and neurosurgeon evaluations ,  as his PCP   Essential hypertension Controlled, no change in medication    CAD (coronary atherosclerotic disease) Pt referred to cardiology  And saw cardiologist in 04/2014, of note , a stress test that was ordered by cardiology,  was cancelled by the patient    Hyperlipidemia Not at goal,  No med change at this time Lipid Panel  Lab Results  Component Value Date   CHOL 164 02/07/2015   HDL 34* 02/07/2015   LDLCALC 102* 02/07/2015   TRIG 140 02/07/2015   CHOLHDL  4.8 02/07/2015

## 2015-02-22 NOTE — Patient Instructions (Signed)
F/u in 4 month, call if you need me before  You are referred for Korea of both kidneys due to flank pain and also nicotine use and hypertension  You need repeat imaging of your lungs, I will order, I will also refer you back to Dr Luan Pulling for ongoing evaluation of abnormal chest scan  PLEASE QUIT NICOTINE USE  Keep appt with urology re prostate  STILL NEED COLONOSCOPY

## 2015-02-23 ENCOUNTER — Telehealth: Payer: Self-pay | Admitting: Family Medicine

## 2015-02-23 NOTE — Assessment & Plan Note (Signed)
Not at goal,  No med change at this time Lipid Panel  Lab Results  Component Value Date   CHOL 164 02/07/2015   HDL 34* 02/07/2015   LDLCALC 102* 02/07/2015   TRIG 140 02/07/2015   CHOLHDL 4.8 02/07/2015

## 2015-02-23 NOTE — Assessment & Plan Note (Signed)
Need for colonoascopy again discussed, pt continues to refuse at this time, encouraged to reconsider, benefit of screening discussed

## 2015-02-23 NOTE — Assessment & Plan Note (Signed)
Needs ongoing follow up with pulmonary, same is arranged

## 2015-02-23 NOTE — Assessment & Plan Note (Signed)
Pt referred to cardiology  And saw cardiologist in 04/2014, of note , a stress test that was ordered by cardiology,  was cancelled by the patient

## 2015-02-23 NOTE — Telephone Encounter (Signed)
I will write a work note for pt to be out until 03/01/2015 because he is calling with a message of this nature. I will not extend the note beyond 05/05 however, please make this clear , he will need to get this from either Dr Hal Neer  Or Dr Luna Glasgow.

## 2015-02-23 NOTE — Telephone Encounter (Signed)
He has an appt for renal US on 5/2, please4 send for Dr Briscoe Burns note.  He said he was seeing him after  My visit and I do not know what Dr he is seeing next week. Pls also ask for the neurosurgeons office note. I need more info as I am not clear as tio what is going on Pls get name of Doc he is seeing on 5/4 also and reason for visit

## 2015-02-23 NOTE — Assessment & Plan Note (Signed)
reports that neurosurgeon told him that his pain will not be helped by surgery. States he has f/u appt with ortho who took him out of work in am, I have advised him to ensure he  Keep f/u there I also told him that based on printed MRI report I still felt that his lower extremity pain is likely  from severe spine problems, however , he will continue that issue with Dr Luna Glasgow  I will also attempt to obtain info from recent ortho and neurosurgeon evaluations , as his PCP

## 2015-02-23 NOTE — Assessment & Plan Note (Signed)
Korea of kidneyys ordered to further evalaute flank pain  Today's complaint is of right flank pain

## 2015-02-23 NOTE — Telephone Encounter (Signed)
Done

## 2015-02-23 NOTE — Assessment & Plan Note (Signed)
Controlled, no change in medication  

## 2015-02-23 NOTE — Assessment & Plan Note (Signed)
Patient counseled for approximately 5 minutes regarding the health risks of ongoing nicotine use, specifically all types of cancer, heart disease, stroke and respiratory failure. The options available for help with cessation ,the behavioral changes to assist the process, and the option to either gradully reduce usage  Or abruptly stop.is also discussed. Pt is also encouraged to set specific goals in number of cigarettes used daily, as well as to set a quit date.  Has abnormal pET scan and abnormal chest scan in 2015, reports no follow up with pulmonary , will rept chest scan and arrange for same

## 2015-02-23 NOTE — Telephone Encounter (Signed)
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 States he needs it for his job before 3pm today and he will be by here by 10 mins til 3.

## 2015-02-26 ENCOUNTER — Ambulatory Visit (HOSPITAL_COMMUNITY)
Admission: RE | Admit: 2015-02-26 | Discharge: 2015-02-26 | Disposition: A | Discharge status: Home / Self Care | Payer: BLUE CROSS/BLUE SHIELD | Source: Ambulatory Visit | Attending: Family Medicine | Admitting: Family Medicine

## 2015-02-26 DIAGNOSIS — I10 Essential (primary) hypertension: Secondary | ICD-10-CM | POA: Diagnosis not present

## 2015-02-26 DIAGNOSIS — F17208 Nicotine dependence, unspecified, with other nicotine-induced disorders: Secondary | ICD-10-CM

## 2015-02-26 DIAGNOSIS — R109 Unspecified abdominal pain: Secondary | ICD-10-CM | POA: Diagnosis present

## 2015-02-26 DIAGNOSIS — R10A3 Flank pain, bilateral: Secondary | ICD-10-CM

## 2015-02-26 DIAGNOSIS — N400 Enlarged prostate without lower urinary tract symptoms: Secondary | ICD-10-CM | POA: Diagnosis not present

## 2015-02-27 ENCOUNTER — Ambulatory Visit (HOSPITAL_COMMUNITY)
Admission: RE | Admit: 2015-02-27 | Discharge: 2015-02-27 | Disposition: A | Discharge status: Home / Self Care | Payer: BLUE CROSS/BLUE SHIELD | Source: Ambulatory Visit | Attending: Family Medicine | Admitting: Family Medicine

## 2015-02-27 DIAGNOSIS — Z720 Tobacco use: Secondary | ICD-10-CM | POA: Diagnosis not present

## 2015-02-27 DIAGNOSIS — F17200 Nicotine dependence, unspecified, uncomplicated: Secondary | ICD-10-CM

## 2015-02-27 DIAGNOSIS — R911 Solitary pulmonary nodule: Secondary | ICD-10-CM | POA: Insufficient documentation

## 2015-02-27 MED ORDER — IOHEXOL 300 MG/ML  SOLN
80.0000 mL | Freq: Once | INTRAMUSCULAR | Status: AC | PRN
  Administered 2015-02-27: 80 mL via INTRAVENOUS

## 2015-04-12 ENCOUNTER — Other Ambulatory Visit: Payer: Self-pay | Admitting: Family Medicine

## 2015-04-17 ENCOUNTER — Ambulatory Visit: Payer: BLUE CROSS/BLUE SHIELD | Admitting: Urology

## 2015-04-18 ENCOUNTER — Other Ambulatory Visit: Payer: Self-pay | Admitting: Family Medicine

## 2015-05-14 ENCOUNTER — Telehealth: Payer: Self-pay | Admitting: *Deleted

## 2015-05-14 DIAGNOSIS — J441 Chronic obstructive pulmonary disease with (acute) exacerbation: Secondary | ICD-10-CM

## 2015-05-14 MED ORDER — BUDESONIDE-FORMOTEROL FUMARATE 160-4.5 MCG/ACT IN AERO: 2.0000 | INHALATION_SPRAY | Freq: Two times a day (BID) | RESPIRATORY_TRACT | Status: DC

## 2015-05-14 NOTE — Telephone Encounter (Signed)
Pt called [facility] during lunch, pt is requesting his medication for his breathing treatment to be refilled.

## 2015-05-14 NOTE — Telephone Encounter (Signed)
Med refilled.

## 2015-05-15 ENCOUNTER — Other Ambulatory Visit: Payer: Self-pay

## 2015-05-15 DIAGNOSIS — J441 Chronic obstructive pulmonary disease with (acute) exacerbation: Secondary | ICD-10-CM

## 2015-05-15 MED ORDER — IPRATROPIUM-ALBUTEROL 0.5-2.5 (3) MG/3ML IN SOLN: RESPIRATORY_TRACT | Status: DC

## 2015-06-14 ENCOUNTER — Encounter: Payer: Self-pay | Admitting: Family Medicine

## 2015-06-14 ENCOUNTER — Ambulatory Visit (INDEPENDENT_AMBULATORY_CARE_PROVIDER_SITE_OTHER): Payer: BLUE CROSS/BLUE SHIELD | Admitting: Family Medicine

## 2015-06-14 VITALS — BP 120/78 | HR 68 | Resp 16 | Ht 74.0 in | Wt 167.4 lb

## 2015-06-14 DIAGNOSIS — Z91199 Patient's noncompliance with other medical treatment and regimen due to unspecified reason: Secondary | ICD-10-CM

## 2015-06-14 DIAGNOSIS — J439 Emphysema, unspecified: Secondary | ICD-10-CM

## 2015-06-14 DIAGNOSIS — I2510 Atherosclerotic heart disease of native coronary artery without angina pectoris: Secondary | ICD-10-CM

## 2015-06-14 DIAGNOSIS — Z5329 Procedure and treatment not carried out because of patient's decision for other reasons: Secondary | ICD-10-CM

## 2015-06-14 DIAGNOSIS — Z720 Tobacco use: Secondary | ICD-10-CM

## 2015-06-14 DIAGNOSIS — E785 Hyperlipidemia, unspecified: Secondary | ICD-10-CM

## 2015-06-14 DIAGNOSIS — M5410 Radiculopathy, site unspecified: Secondary | ICD-10-CM

## 2015-06-14 DIAGNOSIS — F17200 Nicotine dependence, unspecified, uncomplicated: Secondary | ICD-10-CM

## 2015-06-14 DIAGNOSIS — I10 Essential (primary) hypertension: Secondary | ICD-10-CM

## 2015-06-14 DIAGNOSIS — R918 Other nonspecific abnormal finding of lung field: Secondary | ICD-10-CM

## 2015-06-14 NOTE — Patient Instructions (Addendum)
F/U with rectal in January, call if you need me before  Please commit to smoking cessation  Please call if you decide on colonoscopy  We will pursue neb solution issue, you report nausea, tremor with current, also headache  Coupons for albuterol and symbicort if available  Fasting lipid, cmp  In January  Thanks for choosing [facility], we consider it a privelige to serve you.     Smoking Cessation Quitting smoking is important to your health and has many advantages. However, it is not always easy to quit since nicotine is a very addictive drug. Oftentimes, people try 3 times or more before being able to quit. This document explains the best ways for you to prepare to quit smoking. Quitting takes hard work and a lot of effort, but you can do it. ADVANTAGES OF QUITTING SMOKING  You will live longer, feel better, and live better.  Your body will feel the impact of quitting smoking almost immediately.  Within 20 minutes, blood pressure decreases. Your pulse returns to its normal level.  After 8 hours, carbon monoxide levels in the blood return to normal. Your oxygen level increases.  After 24 hours, the chance of having a heart attack starts to decrease. Your breath, hair, and body stop smelling like smoke.  After 48 hours, damaged nerve endings begin to recover. Your sense of taste and smell improve.  After 72 hours, the body is virtually free of nicotine. Your bronchial tubes relax and breathing becomes easier.  After 2 to 12 weeks, lungs can hold more air. Exercise becomes easier and circulation improves.  The risk of having a heart attack, stroke, cancer, or lung disease is greatly reduced.  After 1 year, the risk of coronary heart disease is cut in half.  After 5 years, the risk of stroke falls to the same as a nonsmoker.  After 10 years, the risk of lung cancer is cut in half and the risk of other cancers decreases significantly.  After 15 years, the risk  of coronary heart disease drops, usually to the level of a nonsmoker.  If you are pregnant, quitting smoking will improve your chances of having a healthy baby.  The people you live with, especially any children, will be healthier.  You will have extra money to spend on things other than cigarettes. QUESTIONS TO THINK ABOUT BEFORE ATTEMPTING TO QUIT You may want to talk about your answers with your health care provider.  Why do you want to quit?  If you tried to quit in the past, what helped and what did not?  What will be the most difficult situations for you after you quit? How will you plan to handle them?  Who can help you through the tough times? Your family? Friends? A health care provider?  What pleasures do you get from smoking? What ways can you still get pleasure if you quit? Here are some questions to ask your health care provider:  How can you help me to be successful at quitting?  What medicine do you think would be best for me and how should I take it?  What should I do if I need more help?  What is smoking withdrawal like? How can I get information on withdrawal? GET READY  Set a quit date.  Change your environment by getting rid of all cigarettes, ashtrays, matches, and lighters in your home, car, or work. Do not let people smoke in your home.  Review your past attempts to quit.  Think about what worked and what did not. GET SUPPORT AND ENCOURAGEMENT You have a better chance of being successful if you have help. You can get support in many ways.  Tell your family, friends, and coworkers that you are going to quit and need their support. Ask them not to smoke around you.  Get individual, group, or telephone counseling and support. Programs are available at [employer] and health centers. Call your local health department for information about programs in your area.  Spiritual beliefs and practices may help some smokers quit.  Download a "quit meter" on  your computer to keep track of quit statistics, such as how long you have gone without smoking, cigarettes not smoked, and money saved.  Get a self-help book about quitting smoking and staying off tobacco. [place] yourself from urges to smoke. Talk to someone, go for a walk, or occupy your time with a task.  Change your normal routine. Take a different route to work. Drink tea instead of coffee. Eat breakfast in a different place.  Reduce your stress. Take a hot bath, exercise, or read a book.  Plan something enjoyable to do every day. Reward yourself for not smoking.  Explore interactive web-based programs that specialize in helping you quit. GET MEDICINE AND USE IT CORRECTLY Medicines can help you stop smoking and decrease the urge to smoke. Combining medicine with the above behavioral methods and support can greatly increase your chances of successfully quitting smoking.  Nicotine replacement therapy helps deliver nicotine to your body without the negative effects and risks of smoking. Nicotine replacement therapy includes nicotine gum, lozenges, inhalers, nasal sprays, and skin patches. Some may be available over-the-counter and others require a prescription.  Antidepressant medicine helps people abstain from smoking, but how this works is unknown. This medicine is available by prescription.  Nicotinic receptor partial agonist medicine simulates the effect of nicotine in your brain. This medicine is available by prescription. Ask your health care provider for advice about which medicines to use and how to use them based on your health history. Your health care provider will tell you what side effects to look out for if you choose to be on a medicine or therapy. Carefully read the information on the package. Do not use any other product containing nicotine while using a nicotine replacement product.  RELAPSE OR DIFFICULT SITUATIONS Most relapses occur  within the first 3 months after quitting. Do not be discouraged if you start smoking again. Remember, most people try several times before finally quitting. You may have symptoms of withdrawal because your body is used to nicotine. You may crave cigarettes, be irritable, feel very hungry, cough often, get headaches, or have difficulty concentrating. The withdrawal symptoms are only temporary. They are strongest when you first quit, but they will go away within 10-14 days. To reduce the chances of relapse, try to:  Avoid drinking alcohol. Drinking lowers your chances of successfully quitting.  Reduce the amount of caffeine you consume. Once you quit smoking, the amount of caffeine in your body increases and can give you symptoms, such as a rapid heartbeat, sweating, and anxiety.  Avoid smokers because they can make you want to smoke.  Do not let weight gain distract you. Many smokers will gain weight when they quit, usually less than 10 pounds. Eat a healthy diet and stay active. You can always lose the weight gained after you quit.  Find ways to improve your mood other  than smoking. FOR MORE INFORMATION  www.smokefree.gov  Document Released: 10/07/2001 Document Revised: 02/27/2014 Document Reviewed: 01/22/2012 [facility] Patient Information 2015 [place], [place]. This information is not intended to replace advice given to you by your health care provider. Make sure you discuss any questions you have with your health care provider.

## 2015-06-16 ENCOUNTER — Telehealth: Payer: Self-pay | Admitting: Family Medicine

## 2015-06-16 DIAGNOSIS — I2510 Atherosclerotic heart disease of native coronary artery without angina pectoris: Secondary | ICD-10-CM

## 2015-06-16 NOTE — Assessment & Plan Note (Signed)
continues to refuse colonoscopy

## 2015-06-16 NOTE — Assessment & Plan Note (Signed)
Controlled, no change in medication DASH diet and commitment to daily physical activity for a minimum of 30 minutes discussed and encouraged, as a part of hypertension management. The importance of attaining a healthy weight is also discussed.  BP/Weight 06/14/2015 02/22/2015 02/07/2015 02/02/2015 09/14/2014 05/19/2014 8/50/2774  Systolic BP 128 786 767 - 209 470 962  Diastolic BP 78 72 78 - 78 87 74  Wt. (Lbs) 167.4 175.04 176.12 181 174 165 163  BMI 21.48 22.46 22.6 23.23 22.33 21.18 20.65

## 2015-06-16 NOTE — Assessment & Plan Note (Signed)
Severe and worsening due to ongoing nicotine use, daily user of symbicort, states unable to afford spiriva, needs to quit, followed by pulmonary, will attempt to follow through with report of adverse s/e of current  duoneb , on follow up medication is the same a he has been receiving in the past per pharmacy

## 2015-06-16 NOTE — Telephone Encounter (Signed)
Pls call pt, explain that while reviewing his record, he was seen las July by cardiology dure to chest scan suggesting lockages in arteries of his heart. He needs  To f/u with cardiology, , still smokes and has a f/h of CAD Should have had a stress test per MD note, don't see that it was ordered , but his evaluation is incomplete, though he fells well needs to go, refer to Dr Harl Bowie for f/u CAD pls if he agrees, I will sign. Pls document response

## 2015-06-16 NOTE — Assessment & Plan Note (Signed)

## 2015-06-16 NOTE — Progress Notes (Signed)
Dillon [name]     MRN: [id number]      DOB: Oct 11, 1951   HPI Dillon [name] is here for follow up and re-evaluation of chronic medical conditions, medication management and review of any available recent lab and radiology data.  Preventive health is updated, specifically  Cancer screening and Immunization.  Still holding off on colonoscopy, "not ready" Questions or concerns regarding consultations or procedures which Dillon [name] has had in Dillon interim are  addressed. Dillon [name] reports nausea and  Headache tremor with current duoneb and does not want to use it  There are no new concerns.  There are no specific complaints   ROS Denies recent fever or chills. Denies sinus pressure, nasal congestion, ear pain or sore throat. Denies chest congestion, productive cough or wheezing. Denies chest pains, palpitations and leg swelling Denies abdominal pain, nausea, vomiting,diarrhea or constipation.   Denies dysuria, frequency, hesitancy or incontinence. Denies joint pain, swelling and limitation in mobility. Denies headaches, seizures, numbness, or tingling. Denies depression, anxiety or insomnia. Denies skin break down or rash.   PE  BP 120/78 mmHg  Pulse 68  Resp 16  Ht 6\' 2"  (1.88 m)  Wt 167 lb 6.4 oz (75.932 kg)  BMI 21.48 kg/m2  SpO2 97%  Patient alert and oriented and in no cardiopulmonary distress.  HEENT: No facial asymmetry, EOMI,   oropharynx pink and moist.  Neck supple no JVD, no mass.  Chest: Clear to auscultation bilaterally.decreased though adequate air entry bilaterally  CVS: S1, S2 no murmurs, no S3.Regular rate.  ABD: Soft non tender.   Ext: No edema  MS: Adequate ROM spine, shoulders, hips and knees.  Skin: Intact, no ulcerations or rash noted.  Psych: Good eye contact, normal affect. Memory intact not anxious or depressed appearing.  CNS: CN 2-12 intact, power,  normal throughout.no focal deficits noted.   Assessment & Plan   *Essential  hypertension Controlled, no change in medication DASH diet and commitment to daily physical activity for a minimum of 30 minutes discussed and encouraged, as a part of hypertension management. Dillon importance of attaining a healthy weight is also discussed.  BP/Weight 06/14/2015 02/22/2015 02/07/2015 02/02/2015 09/14/2014 05/19/2014 02/02/8118  Systolic BP 147 829 562 - 130 865 784  Diastolic BP 78 72 78 - 78 87 74  Wt. (Lbs) 167.4 175.04 176.12 181 174 165 163  BMI 21.48 22.46 22.6 23.23 22.33 21.18 20.65        CAD (coronary atherosclerotic disease) Seen in 2015 by cardiology, stress test mentioned at [place], no follow through, needs follow up , will contact [name] about this and encourage follow through  COPD (chronic obstructive pulmonary disease) Severe and worsening due to ongoing nicotine use, daily user of symbicort, states unable to afford spiriva, needs to quit, followed by pulmonary, will attempt to follow through with report of adverse s/e of current  duoneb , on follow up medication is Dillon same a he has been receiving in Dillon past per pharmacy  Back pain with left-sided radiculopathy controlled a  Hyperlipidemia Hyperlipidemia:Low fat diet discussed and encouraged.   Lipid Panel  Lab Results  Component Value Date   CHOL 164 02/07/2015   HDL 34* 02/07/2015   LDLCALC 102* 02/07/2015   TRIG 140 02/07/2015   CHOLHDL 4.8 02/07/2015   Updated lab needed at/ before next visit.      NICOTINE ADDICTION Patient counseled for approximately 5 minutes regarding Dillon health risks of ongoing nicotine use, specifically all types of  cancer, heart disease, stroke and respiratory failure. Dillon options available for help with cessation ,Dillon behavioral changes to assist Dillon process, and Dillon option to either gradully reduce usage  Or abruptly stop.is also discussed. [name] is also encouraged to set specific goals in number of cigarettes used daily, as well as to set a quit date.  Number of  cigarettes/cigars currently smoking daily: 10   Patient non-compliant, refused service continues to refuse colonoscopy  Lung nodules Followed by pulmonary

## 2015-06-16 NOTE — Assessment & Plan Note (Signed)
Hyperlipidemia:Low fat diet discussed and encouraged.   Lipid Panel  Lab Results  Component Value Date   CHOL 164 02/07/2015   HDL 34* 02/07/2015   LDLCALC 102* 02/07/2015   TRIG 140 02/07/2015   CHOLHDL 4.8 02/07/2015   Updated lab needed at/ before next visit.

## 2015-06-16 NOTE — Assessment & Plan Note (Signed)
Followed by pulmonary 

## 2015-06-16 NOTE — Assessment & Plan Note (Signed)
controlled a

## 2015-06-16 NOTE — Assessment & Plan Note (Signed)
Seen in 2015 by cardiology, stress test mentioned at [place], no follow through, needs follow up , will contact pt about this and encourage follow through

## 2015-06-19 ENCOUNTER — Telehealth: Payer: Self-pay | Admitting: *Deleted

## 2015-06-19 NOTE — Telephone Encounter (Signed)
Pt aware and he is referred

## 2015-06-19 NOTE — Telephone Encounter (Signed)
Aware of referral

## 2015-06-19 NOTE — Telephone Encounter (Signed)
Pt called [facility] during lunch stating he is returning a call

## 2015-06-19 NOTE — Addendum Note (Signed)
Addended by: Eual Fines on: 06/19/2015 01:14 PM   Modules accepted: Orders

## 2015-07-10 ENCOUNTER — Telehealth: Payer: Self-pay | Admitting: Cardiology

## 2015-07-10 ENCOUNTER — Encounter: Payer: Self-pay | Admitting: *Deleted

## 2015-07-10 ENCOUNTER — Encounter: Payer: Self-pay | Admitting: Cardiology

## 2015-07-10 ENCOUNTER — Ambulatory Visit (INDEPENDENT_AMBULATORY_CARE_PROVIDER_SITE_OTHER): Payer: BLUE CROSS/BLUE SHIELD | Admitting: Cardiology

## 2015-07-10 VITALS — BP 126/78 | HR 65 | Ht 74.0 in | Wt 162.0 lb

## 2015-07-10 DIAGNOSIS — I2510 Atherosclerotic heart disease of native coronary artery without angina pectoris: Secondary | ICD-10-CM

## 2015-07-10 DIAGNOSIS — R079 Chest pain, unspecified: Secondary | ICD-10-CM | POA: Diagnosis not present

## 2015-07-10 NOTE — Patient Instructions (Signed)
Your physician wants you to follow-up in: [place] DR. BRANCH You will receive a reminder letter in the mail two months in advance. If you don't receive a letter, please call our office to schedule the follow-up appointment.  Your physician recommends that you continue on your current medications as directed. Please refer to the Current Medication list given to you today.  Your physician has requested that you have en exercise stress myoview. For further information please visit [email or web address]. Please follow instruction sheet, as given.  HOLD AMLODIPINE THE DAY OF TEST  Thank you for choosing [facility]!!

## 2015-07-10 NOTE — Telephone Encounter (Signed)
EXERCISE CARDIOLITE scheduled for 07-12-15 at [place]

## 2015-07-10 NOTE — Progress Notes (Signed)
Patient ID: Dillon [name], male   DOB: 22-Mar-1951, 64 y.o.   MRN: [id number]     Clinical Summary Dillon [name] is a 64 y.o.male seen today for follow up of the following medical problems.   1.CAD - CT scan chest 01/2014 with incidental finding of coronary calcification - CAD risk factors: HTN, tobacco, FH with sister stents in her 75s, hyperlipidemia  - last visit he was to havean  exercise nuclear stress but does not appear it was done - denies any chest pain, no significant SOB or DOE  2. HTN - checks bp at home 3-4 times a week. Typically around 130/70s. - compliant with meds  3. HL - last panel 01/2015 TC 164 TG 140 HDL 34 LDL 102 - compliant with statin Past Medical History  Diagnosis Date  . Nicotine addiction   . Hypertension   . Asthma   . COPD (chronic obstructive pulmonary disease)      Allergies  Allergen Reactions  . Penicillins      Current Outpatient Prescriptions  Medication Sig Dispense Refill  . albuterol (PROVENTIL) (2.5 MG/3ML) 0.083% nebulizer solution Take 2.5 mg by nebulization every 6 (six) hours as needed for wheezing or shortness of breath.    Marland Kitchen albuterol (VENTOLIN HFA) 108 (90 BASE) MCG/ACT inhaler Inhale into the lungs every 6 (six) hours as needed for wheezing or shortness of breath.    Marland Kitchen amLODipine (NORVASC) 10 MG tablet TAKE ONE TABLET BY MOUTH EVERY DAY 30 tablet 8  . aspirin EC 81 MG tablet Take 81 mg by mouth daily.    . budesonide-formoterol (SYMBICORT) 160-4.5 MCG/ACT inhaler Inhale 2 puffs into the lungs 2 (two) times daily. 1 Inhaler 3  . gabapentin (NEURONTIN) 300 MG capsule Take 1 capsule (300 mg total) by mouth at bedtime. 30 capsule 4  . HYDROcodone-acetaminophen (NORCO/VICODIN) 5-325 MG per tablet     . ipratropium-albuterol (DUONEB) 0.5-2.5 (3) MG/3ML SOLN 3 mLs every 6 (six) hours as needed.     Marland Kitchen ipratropium-albuterol (DUONEB) 0.5-2.5 (3) MG/3ML SOLN One inhalation every 8 hours as needed, for severe wheezing 360 mL 3  .  pravastatin (PRAVACHOL) 40 MG tablet TAKE 1 TABLET (40 MG TOTAL) BY MOUTH EVERY EVENING. 30 tablet 2   No current facility-administered medications for this visit.     Past Surgical History  Procedure Laterality Date  . Knee arthroscopy      both knees      Allergies  Allergen Reactions  . Penicillins       Family History  Problem Relation Age of Onset  . Heart attack Mother   . Diabetes Mother   . Hypertension Mother   . Hypertension Sister   . Hypertension Brother      Social History Dillon [name] reports that he has been smoking Cigarettes.  He has been smoking about 0.25 packs per day. He does not have any smokeless tobacco history on file. Dillon [name] reports that he drinks alcohol.   Review of Systems CONSTITUTIONAL: No weight loss, fever, chills, weakness or fatigue.  HEENT: Eyes: No visual loss, blurred vision, double vision or yellow sclerae.No hearing loss, sneezing, congestion, runny nose or sore throat.  SKIN: No rash or itching.  CARDIOVASCULAR: per HPI RESPIRATORY: No shortness of breath, cough or sputum.  GASTROINTESTINAL: No anorexia, nausea, vomiting or diarrhea. No abdominal pain or blood.  GENITOURINARY: No burning on urination, no polyuria NEUROLOGICAL: No headache, dizziness, syncope, paralysis, ataxia, numbness or tingling in the extremities. No change in  bowel or bladder control.  MUSCULOSKELETAL: No muscle, back pain, joint pain or stiffness.  LYMPHATICS: No enlarged nodes. No history of splenectomy.  PSYCHIATRIC: No history of depression or anxiety.  ENDOCRINOLOGIC: No reports of sweating, cold or heat intolerance. No polyuria or polydipsia.  Marland Kitchen   Physical Examination Filed Vitals:   07/10/15 1000  BP: 126/78  Pulse: 65   Filed Vitals:   07/10/15 1000  Height: 6\' 2"  (1.88 m)  Weight: 162 lb (73.483 kg)    Gen: resting comfortably, no acute distress HEENT: no scleral icterus, pupils equal round and reactive, no palptable cervical  adenopathy,  CV: RRR, no m/r/g, no JVD Resp: Clear to auscultation bilaterally GI: abdomen is soft, non-tender, non-distended, normal bowel sounds, no hepatosplenomegaly MSK: extremities are warm, no edema.  Skin: warm, no rash Neuro:  no focal deficits Psych: appropriate affect      Assessment and Plan   1. CAD - incidental findings of coronary atherosclerosis on recent chest CT - patient with multiple CAD risk factors - denies any current symptoms - will reorder exercise stress test to further risk stratify and evaluate functionality of visualized coronary lesions - continue aggressive risk factor modificaiton  2. HTN - at goal, continue current meds  3. Hyperlipidemia - reasonable control, continue current statin   F/u 1 year  Dillon [name], M.D.

## 2015-07-12 ENCOUNTER — Inpatient Hospital Stay (HOSPITAL_COMMUNITY): Admission: RE | Admit: 2015-07-12 | Payer: BLUE CROSS/BLUE SHIELD | Source: Ambulatory Visit

## 2015-07-12 ENCOUNTER — Encounter (HOSPITAL_COMMUNITY)
Admission: RE | Admit: 2015-07-12 | Discharge: 2015-07-12 | Disposition: A | Discharge status: Home / Self Care | Payer: BLUE CROSS/BLUE SHIELD | Source: Ambulatory Visit | Attending: Cardiology | Admitting: Cardiology

## 2015-07-12 ENCOUNTER — Encounter (HOSPITAL_COMMUNITY): Payer: Self-pay

## 2015-07-12 DIAGNOSIS — R079 Chest pain, unspecified: Secondary | ICD-10-CM

## 2015-07-12 LAB — NM MYOCAR MULTI W/SPECT W/WALL MOTION / EF
Estimated workload: 4.6 METS
Exercise duration (min): 2 min
Exercise duration (sec): 27 s
LV dias vol: 117 mL
LV sys vol: 48 mL
MPHR: 158 {beats}/min
Peak HR: 99 {beats}/min
Percent HR: 62 %
RATE: 0.27
RPE: 13
Rest HR: 56 {beats}/min
SDS: 0
SRS: 0
SSS: 0
TID: 0.98

## 2015-07-12 MED ORDER — TECHNETIUM TC 99M SESTAMIBI - CARDIOLITE
10.0000 | Freq: Once | INTRAVENOUS | Status: AC | PRN
  Administered 2015-07-12: 09:00:00 9 via INTRAVENOUS

## 2015-07-12 MED ORDER — REGADENOSON 0.4 MG/5ML IV SOLN
INTRAVENOUS | Status: AC
  Administered 2015-07-12: 0.4 mg via INTRAVENOUS
  Filled 2015-07-12: qty 5

## 2015-07-12 MED ORDER — SODIUM CHLORIDE 0.9 % IJ SOLN
INTRAMUSCULAR | Status: AC
  Administered 2015-07-12: 10 mL via INTRAVENOUS
  Filled 2015-07-12: qty 3

## 2015-07-12 MED ORDER — TECHNETIUM TC 99M SESTAMIBI GENERIC - CARDIOLITE
30.0000 | Freq: Once | INTRAVENOUS | Status: AC | PRN
Start: 2015-07-12 — End: 2015-07-12
  Administered 2015-07-12: 29.2 via INTRAVENOUS

## 2015-07-16 ENCOUNTER — Telehealth: Payer: Self-pay | Admitting: *Deleted

## 2015-07-16 NOTE — Telephone Encounter (Signed)
-----   Message from Arnoldo Lenis, MD sent at 07/13/2015  2:02 PM EDT ----- Stress test looks good. No evidence of any significant blockages  Zandra Abts MD

## 2015-07-16 NOTE — Telephone Encounter (Signed)
Notes Recorded by Laurine Blazer, LPN on 2/89/7915 at 0:41 PM Patient notified. Copy fwd to pmd.

## 2015-07-22 ENCOUNTER — Other Ambulatory Visit: Payer: Self-pay | Admitting: Family Medicine

## 2015-11-13 ENCOUNTER — Encounter: Payer: Self-pay | Admitting: Family Medicine

## 2015-11-13 ENCOUNTER — Ambulatory Visit: Payer: BLUE CROSS/BLUE SHIELD | Admitting: Family Medicine

## 2015-12-20 ENCOUNTER — Other Ambulatory Visit: Payer: Self-pay | Admitting: Family Medicine

## 2015-12-24 ENCOUNTER — Telehealth: Payer: Self-pay | Admitting: Orthopaedic Surgery

## 2015-12-24 MED ORDER — HYDROCODONE-ACETAMINOPHEN 5-325 MG PO TABS
1.0000 | ORAL_TABLET | ORAL | Status: DC | PRN
Start: 2015-12-24 — End: 2015-12-31

## 2015-12-24 NOTE — Telephone Encounter (Signed)
Patient requests a refill on Hydrocodone 5-325 mgs.  Qty  120.  This was last filled on 11-19-15

## 2015-12-24 NOTE — Telephone Encounter (Signed)
Rx printed

## 2015-12-28 ENCOUNTER — Telehealth: Payer: Self-pay | Admitting: Orthopaedic Surgery

## 2015-12-28 NOTE — Telephone Encounter (Signed)
Patient requests refill on Hydrocodone 5-325 mgs.  Qty 120  Last filled 11-19-15.  I noticed there was a refill done on 12-24-15 but this was never brought up front nor copied.  We have searched for the prescription but believe somehow there was a problem with the printing of the prescription.

## 2015-12-31 MED ORDER — HYDROCODONE-ACETAMINOPHEN 5-325 MG PO TABS: 1.0000 | ORAL_TABLET | ORAL | Status: DC | PRN

## 2015-12-31 NOTE — Telephone Encounter (Signed)
Rx done. 

## 2015-12-31 NOTE — Telephone Encounter (Signed)
PLEASE REPRINT

## 2016-01-02 ENCOUNTER — Encounter: Payer: Self-pay | Admitting: Family Medicine

## 2016-01-02 ENCOUNTER — Ambulatory Visit (INDEPENDENT_AMBULATORY_CARE_PROVIDER_SITE_OTHER): Payer: BLUE CROSS/BLUE SHIELD | Admitting: Family Medicine

## 2016-01-02 VITALS — BP 146/80 | HR 72 | Resp 18 | Ht 74.0 in | Wt 167.0 lb

## 2016-01-02 DIAGNOSIS — Z5329 Procedure and treatment not carried out because of patient's decision for other reasons: Secondary | ICD-10-CM

## 2016-01-02 DIAGNOSIS — F17200 Nicotine dependence, unspecified, uncomplicated: Secondary | ICD-10-CM

## 2016-01-02 DIAGNOSIS — R918 Other nonspecific abnormal finding of lung field: Secondary | ICD-10-CM

## 2016-01-02 DIAGNOSIS — Z114 Encounter for screening for human immunodeficiency virus [HIV]: Secondary | ICD-10-CM | POA: Diagnosis not present

## 2016-01-02 DIAGNOSIS — J441 Chronic obstructive pulmonary disease with (acute) exacerbation: Secondary | ICD-10-CM | POA: Diagnosis not present

## 2016-01-02 DIAGNOSIS — Z125 Encounter for screening for malignant neoplasm of prostate: Secondary | ICD-10-CM

## 2016-01-02 DIAGNOSIS — I10 Essential (primary) hypertension: Secondary | ICD-10-CM | POA: Diagnosis not present

## 2016-01-02 DIAGNOSIS — M5410 Radiculopathy, site unspecified: Secondary | ICD-10-CM

## 2016-01-02 DIAGNOSIS — E785 Hyperlipidemia, unspecified: Secondary | ICD-10-CM

## 2016-01-02 DIAGNOSIS — Z91199 Patient's noncompliance with other medical treatment and regimen due to unspecified reason: Secondary | ICD-10-CM

## 2016-01-02 DIAGNOSIS — Z1159 Encounter for screening for other viral diseases: Secondary | ICD-10-CM

## 2016-01-02 DIAGNOSIS — R972 Elevated prostate specific antigen [PSA]: Secondary | ICD-10-CM

## 2016-01-02 MED ORDER — IPRATROPIUM-ALBUTEROL 0.5-2.5 (3) MG/3ML IN SOLN: RESPIRATORY_TRACT | Status: DC

## 2016-01-02 NOTE — Progress Notes (Signed)
Subjective:    Patient ID: Dillon [name], male    DOB: 04/08/1951, 65 y.o.   MRN: [id number]  HPI   Dillon [name]     MRN: [id number]      DOB: 08/16/1951   HPI Dillon [name] is here for follow up and re-evaluation of chronic medical conditions, medication management and review of any available recent lab and radiology data.  Preventive health is updated, specifically  Cancer screening and Immunization.   Questions or concerns regarding consultations or procedures which the PT has had in the interim are  addressed. The PT denies any adverse reactions to current medications since the last visit.  There are no new concerns.  There are no specific complaints   ROS Denies recent fever or chills. Denies sinus pressure, nasal congestion, ear pain or sore throat. Denies chest congestion, productive cough or wheezing. Denies chest pains, palpitations and leg swelling Denies abdominal pain, nausea, vomiting,diarrhea or constipation.   Denies dysuria, frequency, hesitancy or incontinence. Denies joint pain, swelling and limitation in mobility. Denies headaches, seizures, numbness, or tingling. Denies depression, anxiety or insomnia. Denies skin break down or rash.   PE  BP 146/80 mmHg  Pulse 72  Resp 18  Ht 6\' 2"  (1.88 m)  Wt 167 lb (75.751 kg)  BMI 21.43 kg/m2  SpO2 100%  Patient alert and oriented and in no cardiopulmonary distress.  HEENT: No facial asymmetry, EOMI,   oropharynx pink and moist.  Neck supple no JVD, no mass.  Chest: Clear to auscultation bilaterally.  CVS: S1, S2 no murmurs, no S3.Regular rate.  ABD: Soft non tender.   Ext: No edema  MS: Adequate ROM spine, shoulders, hips and knees.  Skin: Intact, no ulcerations or rash noted.  Psych: Good eye contact, normal affect. Memory intact not anxious or depressed appearing.  CNS: CN 2-12 intact, power,  normal throughout.no focal deficits noted.   Assessment & Plan   Essential  hypertension Uncontrolled,  DASH diet and commitment to daily physical activity for a minimum of 30 minutes discussed and encouraged, as a part of hypertension management. The importance of attaining a healthy weight is also discussed.  BP/Weight 01/02/2016 07/10/2015 06/14/2015 02/22/2015 02/07/2015 02/02/2015 [id number]  Systolic BP 123456 [id number] - [id number]  Diastolic BP 80 78 78 72 78 - 78  Wt. (Lbs) 167 162 167.4 175.04 176.12 181 174  BMI 21.43 20.79 21.48 22.46 22.6 23.23 22.33   Nurse BP check in 2 month    NICOTINE ADDICTION Patient counseled for approximately 5 minutes regarding the health risks of ongoing nicotine use, specifically all types of cancer, heart disease, stroke and respiratory failure. The options available for help with cessation ,the behavioral changes to assist the process, and the option to either gradully reduce usage  Or abruptly stop.is also discussed. Pt is also encouraged to set specific goals in number of cigarettes used daily, as well as to set a quit date.  Number of cigarettes/cigars currently smoking daily: 5   Hyperlipidemia Hyperlipidemia:Low fat diet discussed and encouraged.   Lipid Panel  Lab Results  Component Value Date   CHOL 164 02/07/2015   HDL 34* 02/07/2015   LDLCALC 102* 02/07/2015   TRIG 140 02/07/2015   CHOLHDL 4.8 02/07/2015      Updated lab needed is past due.   Lung nodules Continues to smoke. Followed by pulmonary, referred for annual follow up  Patient non-compliant, refused service Refuses both colonoscopy and influenza vaccine. Counseled re  the value of both  Back pain with left-sided radiculopathy Pain mangement by ortho , uses hydrocodone. Gabapentin also used daily  COPD (chronic obstructive pulmonary disease) Progressively worsening due to ongoing nicotine use, needs pulmonary fu annualy      Review of Systems     Objective:   Physical Exam        Assessment & Plan:

## 2016-01-02 NOTE — Patient Instructions (Addendum)
CPE  In 6 month, call if you need me before  Nurse BP check in 2 month  BP high today, cut back on salty food  Pls decide on getting colonoscopy, you NEED this, it saves lives!, call for referral when you decide  Fasting labs THIS week please  Continue to work on quitting smoking  You are referred to Dr Luan Pulling to f/u on lung nodule  Thanks for choosing [facility], we consider it a privelige to serve you.  You Can Quit Smoking If you are ready to quit smoking or are thinking about it, congratulations! You have chosen to help yourself be healthier and live longer! There are lots of different ways to quit smoking. Nicotine gum, nicotine patches, a nicotine inhaler, or nicotine nasal spray can help with physical craving. Hypnosis, support groups, and medicines help break the habit of smoking. TIPS TO GET OFF AND STAY OFF CIGARETTES  Learn to predict your moods. Do not let a bad situation be your excuse to have a cigarette. Some situations in your life might tempt you to have a cigarette.  Ask friends and co-workers not to smoke around you.  Make your home smoke-free.  Never have "just one" cigarette. It leads to wanting another and another. Remind yourself of your decision to quit.  On a card, make a list of your reasons for not smoking. Read it at least the same number of times a day as you have a cigarette. Tell yourself everyday, "I do not want to smoke. I choose not to smoke."  Ask someone at home or work to help you with your plan to quit smoking.  Have something planned after you eat or have a cup of coffee. Take a walk or get other exercise to perk you up. This will help to keep you from overeating.  Try a relaxation exercise to calm you down and decrease your stress. Remember, you may be tense and nervous the first two weeks after you quit. This will pass.  Find new activities to keep your hands busy. Play with a pen, coin, or rubber band. Doodle or draw things  on paper.  Brush your teeth right after eating. This will help cut down the craving for the taste of tobacco after meals. You can try mouthwash too.  Try gum, breath mints, or diet candy to keep something in your mouth. IF YOU SMOKE AND WANT TO QUIT:  Do not stock up on cigarettes. Never buy a carton. Wait until one pack is finished before you buy another.  Never carry cigarettes with you at work or at home.  Keep cigarettes as far away from you as possible. Leave them with someone else.  Never carry matches or a lighter with you.  Ask yourself, "Do I need this cigarette or is this just a reflex?"  Bet with someone that you can quit. Put cigarette money in a piggy bank every morning. If you smoke, you give up the money. If you do not smoke, by the end of the week, you keep the money.  Keep trying. It takes 21 days to change a habit!  Talk to your doctor about using medicines to help you quit. These include nicotine replacement gum, lozenges, or skin patches.   This information is not intended to replace advice given to you by your health care provider. Make sure you discuss any questions you have with your health care provider.   Document Released: 08/09/2009 Document Revised: 01/05/2012 Document Reviewed: 08/09/2009  Chartered certified accountant Patient Education [employer].

## 2016-01-03 NOTE — Assessment & Plan Note (Signed)
Progressively worsening due to ongoing nicotine use, needs pulmonary fu annualy

## 2016-01-03 NOTE — Assessment & Plan Note (Signed)

## 2016-01-03 NOTE — Assessment & Plan Note (Signed)
Hyperlipidemia:Low fat diet discussed and encouraged.   Lipid Panel  Lab Results  Component Value Date   CHOL 164 02/07/2015   HDL 34* 02/07/2015   LDLCALC 102* 02/07/2015   TRIG 140 02/07/2015   CHOLHDL 4.8 02/07/2015      Updated lab needed is past due.

## 2016-01-03 NOTE — Assessment & Plan Note (Signed)
Pain mangement by ortho , uses hydrocodone. Gabapentin also used daily

## 2016-01-03 NOTE — Assessment & Plan Note (Signed)
Uncontrolled,  DASH diet and commitment to daily physical activity for a minimum of 30 minutes discussed and encouraged, as a part of hypertension management. The importance of attaining a healthy weight is also discussed.  BP/Weight 01/02/2016 07/10/2015 06/14/2015 02/22/2015 02/07/2015 02/02/2015 [id number]  Systolic BP 123456 [id number] 123456 [id number] - [id number]  Diastolic BP 80 78 78 72 78 - 78  Wt. (Lbs) 167 162 167.4 175.04 176.12 181 174  BMI 21.43 20.79 21.48 22.46 22.6 23.23 22.33   Nurse BP check in 2 month

## 2016-01-03 NOTE — Assessment & Plan Note (Signed)
Continues to smoke. Followed by pulmonary, referred for annual follow up

## 2016-01-03 NOTE — Assessment & Plan Note (Signed)
Refuses both colonoscopy and influenza vaccine. Counseled re the value of both

## 2016-01-15 ENCOUNTER — Other Ambulatory Visit: Payer: Self-pay | Admitting: Acute Care

## 2016-01-15 ENCOUNTER — Encounter: Payer: Self-pay | Admitting: Acute Care

## 2016-01-15 DIAGNOSIS — F17210 Nicotine dependence, cigarettes, uncomplicated: Secondary | ICD-10-CM

## 2016-01-17 ENCOUNTER — Ambulatory Visit (INDEPENDENT_AMBULATORY_CARE_PROVIDER_SITE_OTHER): Payer: BLUE CROSS/BLUE SHIELD | Admitting: Orthopaedic Surgery

## 2016-01-17 ENCOUNTER — Encounter: Payer: Self-pay | Admitting: Orthopaedic Surgery

## 2016-01-17 ENCOUNTER — Ambulatory Visit (INDEPENDENT_AMBULATORY_CARE_PROVIDER_SITE_OTHER): Payer: BLUE CROSS/BLUE SHIELD

## 2016-01-17 VITALS — BP 135/80 | HR 77 | Temp 98.1°F | Resp 16 | Ht 74.0 in | Wt 169.0 lb

## 2016-01-17 DIAGNOSIS — M25512 Pain in left shoulder: Secondary | ICD-10-CM | POA: Diagnosis not present

## 2016-01-17 NOTE — Patient Instructions (Signed)

## 2016-01-17 NOTE — Progress Notes (Signed)
Patient VN:[id number] Dillon [name], male DOB:[birth year], 65 y.o. [id number]  Chief Complaint  Patient presents with  . Follow-up    follow up bilateral knee pain "same"    HPI  Dillon [name] is a 65 y.o. male who was scheduled to be seen for his knee pains.  He has chronic knee pain bilaterally.  He has done well with the knees he says.  They have good and bad days. He has no giving way, no locking, no redness.  He has occasional swelling.   His problem today is pain in the left shoulder.  He has had increasing pain over the last month.  He has no new trauma, no paresthesias. Shoulder Pain  The pain is present in the left shoulder. This is a new problem. The current episode started 1 to 4 weeks ago. There has been no history of extremity trauma. The problem occurs daily. The problem has been gradually worsening. The quality of the pain is described as aching. The pain is at a severity of 5/10. The pain is moderate. The symptoms are aggravated by activity and cold. He has tried NSAIDS, OTC pain meds, oral narcotics and rest for the symptoms. The treatment provided mild relief.    Body mass index is 21.69 kg/(m^2).    Review of Systems  Constitutional:       Patient does not have Diabetes Mellitus. Patient has hypertension. Patient has COPD or shortness of breath. Patient does not have BMI > 35. Patient has current smoking history.  HENT: Negative for congestion.   Respiratory: Negative for shortness of breath.   Cardiovascular: Negative for chest pain.  Endocrine: Positive for cold intolerance.  Musculoskeletal: Positive for myalgias, arthralgias and gait problem.    Past Medical History  Diagnosis Date  . Nicotine addiction   . Hypertension   . Asthma   . COPD (chronic obstructive pulmonary disease) [facility])     Past Surgical History  Procedure Laterality Date  . Knee arthroscopy      both knees     Family History  Problem Relation Age of Onset  . Heart attack Mother   .  Diabetes Mother   . Hypertension Mother   . Hypertension Sister   . Hypertension Brother     Social History Social History  Substance Use Topics  . Smoking status: Current Some Day Smoker -- 0.75 packs/day for 48 years    Types: Cigarettes    Start date: 10/16/1969  . Smokeless tobacco: Never Used     Comment: 5 per day--07/10/15 patient reported smoking 12 cigs/day (uses vapor device)  . Alcohol Use: 0.0 oz/week    0 Standard drinks or equivalent per week     Comment: occassional , max of 72 oz pwr month    Allergies  Allergen Reactions  . Penicillins Hives    Current Outpatient Prescriptions  Medication Sig Dispense Refill  . albuterol (VENTOLIN HFA) 108 (90 BASE) MCG/ACT inhaler Inhale into the lungs every 6 (six) hours as needed for wheezing or shortness of breath.    Marland Kitchen amLODipine (NORVASC) 10 MG tablet TAKE ONE TABLET BY MOUTH EVERY DAY 30 tablet 8  . aspirin EC 81 MG tablet Take 81 mg by mouth daily.    . budesonide-formoterol (SYMBICORT) 160-4.5 MCG/ACT inhaler Inhale 2 puffs into the lungs 2 (two) times daily. 1 Inhaler 3  . gabapentin (NEURONTIN) 300 MG capsule TAKE 1 CAPSULE (300 MG TOTAL) BY MOUTH AT BEDTIME. 30 capsule 2  . HYDROcodone-acetaminophen (NORCO/VICODIN) 5-325  MG tablet     . ipratropium-albuterol (DUONEB) 0.5-2.5 (3) MG/3ML SOLN One inhalation every 8 hours as needed, for severe wheezing 360 mL 3  . pravastatin (PRAVACHOL) 40 MG tablet TAKE 1 TABLET BY MOUTH EVERY EVENING 30 tablet 2   No current facility-administered medications for this visit.     Physical Exam  Blood pressure 135/80, pulse 77, temperature 98.1 F (36.7 C), resp. rate 16, height 6\' 2"  (1.88 m), weight 169 lb (76.658 kg).  Constitutional: overall normal hygiene, normal nutrition, well developed, normal grooming, normal body habitus. Assistive device:none  Musculoskeletal: gait and station Limp none, muscle tone and strength are normal, no tremors or atrophy is present.  .   Neurological: coordination overall normal.  Deep tendon reflex/nerve stretch intact.  Sensation normal.  Cranial nerves II-XII intact.   Skin:   normal overall no scars, lesions, ulcers or rashes. No psoriasis.  Psychiatric: Alert and oriented x 3.  Recent memory intact, remote memory unclear.  Normal mood and affect. Well groomed.  Good eye contact.  Cardiovascular: overall no swelling, no varicosities, no edema bilaterally, normal temperatures of the legs and arms, no clubbing, cyanosis and good capillary refill.  Lymphatic: palpation is normal. Examination of left Upper Extremity is done.  Inspection:   Overall:  Elbow non-tender without crepitus or defects, forearm non-tender without crepitus or defects, wrist non-tender without crepitus or defects, hand non-tender.    Shoulder: with glenohumeral joint tenderness, without effusion.   Upper arm: without swelling and tenderness   Range of motion:   Overall:  Full range of motion of the elbow, full range of motion of wrist and full range of motion in fingers.   Shoulder:  left  160 degrees forward flexion; 145 degrees abduction; 30 degrees internal rotation, 30 degrees external rotation, 20 degrees extension, 40 degrees adduction.   Stability:   Overall:  Shoulder, elbow and wrist stable   Strength and Tone:   Overall full shoulder muscles strength, full upper arm strength and normal upper arm bulk and tone.  Both knees are tender and have crepitus.  He has no effusion today.  ROM of the left is 0-110 and the right 0-105.  They are stable.  He was told again about his smoking and to cut down or stop.  He says he will try.  His hypertension is well controlled.  He has no distal edema.  Additional services performed: x-rays of the left shoulder  PROCEDURE NOTE:  The patient request injection, verbal consent was obtained.  The left shoulder was prepped appropriately after time out was performed.   Sterile technique was observed  and injection of 1 cc of Depo-Medrol 40 mg with several cc's of plain xylocaine. Anesthesia was provided by ethyl chloride and a 20-gauge needle was used to inject the shoulder area. A posterior approach was used.  The injection was tolerated well.  A band aid dressing was applied.  The patient was advised to apply ice later today and tomorrow to the injection sight as needed.  The patient has been educated about the nature of the problem(s) and counseled on treatment options.  The patient appeared to understand what I have discussed and is in agreement with it.  Encounter Diagnosis  Name Primary?  . Left shoulder pain Yes    PLAN Call if any problems.  Precautions discussed.  Continue current medications.   Return to clinic 1 month

## 2016-01-19 ENCOUNTER — Other Ambulatory Visit: Payer: Self-pay | Admitting: Family Medicine

## 2016-01-21 ENCOUNTER — Other Ambulatory Visit: Payer: Self-pay

## 2016-01-25 ENCOUNTER — Telehealth: Payer: Self-pay | Admitting: Orthopaedic Surgery

## 2016-01-25 LAB — CBC
HCT: 40.1 % (ref 39.0–52.0)
Hemoglobin: 13.3 g/dL (ref 13.0–17.0)
MCH: 30.9 pg (ref 26.0–34.0)
MCHC: 33.2 g/dL (ref 30.0–36.0)
MCV: 93.3 fL (ref 78.0–100.0)
MPV: 8.5 fL — ABNORMAL LOW (ref 8.6–12.4)
Platelets: 369 10*3/uL (ref 150–400)
RBC: 4.3 MIL/uL (ref 4.22–5.81)
RDW: 14.6 % (ref 11.5–15.5)
WBC: 7.2 10*3/uL (ref 4.0–10.5)

## 2016-01-25 LAB — COMPREHENSIVE METABOLIC PANEL
ALT: 11 U/L (ref 9–46)
AST: 20 U/L (ref 10–35)
Albumin: 4.4 g/dL (ref 3.6–5.1)
Alkaline Phosphatase: 70 U/L (ref 40–115)
BUN: 16 mg/dL (ref 7–25)
CO2: 29 mmol/L (ref 20–31)
Calcium: 9.2 mg/dL (ref 8.6–10.3)
Chloride: 101 mmol/L (ref 98–110)
Creat: 0.87 mg/dL (ref 0.70–1.25)
Glucose, Bld: 67 mg/dL (ref 65–99)
Potassium: 3.9 mmol/L (ref 3.5–5.3)
Sodium: 142 mmol/L (ref 135–146)
Total Bilirubin: 0.5 mg/dL (ref 0.2–1.2)
Total Protein: 6.9 g/dL (ref 6.1–8.1)

## 2016-01-25 LAB — LIPID PANEL
Cholesterol: 166 mg/dL (ref 125–200)
HDL: 66 mg/dL (ref >=40)
LDL Cholesterol: 94 mg/dL (ref <130)
Total CHOL/HDL Ratio: 2.5 Ratio (ref <=5.0)
Triglycerides: 29 mg/dL (ref <150)
VLDL: 6 mg/dL (ref <30)

## 2016-01-25 LAB — PSA: PSA: 10.39 ng/mL — ABNORMAL HIGH (ref <=4.00)

## 2016-01-25 LAB — HIV ANTIBODY (ROUTINE TESTING W REFLEX): HIV 1&2 Ab, 4th Generation: NONREACTIVE

## 2016-01-25 LAB — HEPATITIS C ANTIBODY: HCV Ab: NEGATIVE

## 2016-01-25 NOTE — Telephone Encounter (Signed)
Patient requesting refill of Hydrocodone 5/325mg  Qty 120 Tablets °

## 2016-01-25 NOTE — Addendum Note (Signed)
Addended by: Eual Fines on: 01/25/2016 02:15 PM   Modules accepted: Orders

## 2016-01-29 MED ORDER — HYDROCODONE-ACETAMINOPHEN 5-325 MG PO TABS: 1.0000 | ORAL_TABLET | ORAL | Status: DC | PRN

## 2016-01-29 NOTE — Telephone Encounter (Signed)
Rx done. 

## 2016-02-14 ENCOUNTER — Encounter: Payer: Self-pay | Admitting: Orthopaedic Surgery

## 2016-02-14 ENCOUNTER — Ambulatory Visit (INDEPENDENT_AMBULATORY_CARE_PROVIDER_SITE_OTHER): Payer: BLUE CROSS/BLUE SHIELD | Admitting: Orthopaedic Surgery

## 2016-02-14 VITALS — BP 150/94 | HR 73 | Temp 98.1°F | Ht 74.5 in | Wt 165.0 lb

## 2016-02-14 DIAGNOSIS — M25512 Pain in left shoulder: Secondary | ICD-10-CM | POA: Diagnosis not present

## 2016-02-14 MED ORDER — CYCLOBENZAPRINE HCL 10 MG PO TABS: 10.0000 mg | ORAL_TABLET | Freq: Three times a day (TID) | ORAL | Status: DC | PRN

## 2016-02-14 NOTE — Progress Notes (Signed)
Patient Dillon Dillon [name] Dillon Dillon [name], Dillon [name] DOB:03-30-1951, 65 y.o. [id number]  Chief Complaint  Patient presents with  . Follow-up    left shoulder    HPI  Dillon Dillon [name] is a 65 y.o. Dillon [name] who has chronic left shoulder pain.  He is improved.  He has less pain, no paresthesias, no redness.  He has been doing his exercises and taking his medicine.  He has some pain with overhead use.  He uses ice which helps.  HPI  Body mass index is 20.91 kg/(m^2).  Patient has current smoking history.  Review of Systems  Constitutional:       Patient does not have Diabetes Mellitus. Patient has hypertension. Patient has COPD or shortness of breath. Patient does not have BMI > 35. Patient has current smoking history.  HENT: Negative for congestion.   Respiratory: Negative for shortness of breath.   Cardiovascular: Negative for chest pain.  Endocrine: Positive for cold intolerance.  Musculoskeletal: Positive for myalgias, arthralgias and gait problem.    Past Medical History  Diagnosis Date  . Nicotine addiction   . Hypertension   . Asthma   . COPD (chronic obstructive pulmonary disease) [facility])     Past Surgical History  Procedure Laterality Date  . Knee arthroscopy      both knees     Family History  Problem Relation Age of Onset  . Heart attack Mother   . Diabetes Mother   . Hypertension Mother   . Hypertension Sister   . Hypertension Brother     Social History Social History  Substance Use Topics  . Smoking status: Current Some Day Smoker -- 0.75 packs/day for 48 years    Types: Cigarettes    Start date: 10/16/1969  . Smokeless tobacco: Never Used     Comment: 5 per day--07/10/15 patient reported smoking 12 cigs/day (uses vapor device)  . Alcohol Use: 0.0 oz/week    0 Standard drinks or equivalent per week     Comment: occassional , max of 72 oz pwr month    Allergies  Allergen Reactions  . Penicillins Hives    Current Outpatient Prescriptions  Medication Sig Dispense  Refill  . albuterol (VENTOLIN HFA) 108 (90 BASE) MCG/ACT inhaler Inhale into the lungs every 6 (six) hours as needed for wheezing or shortness of breath.    Marland Kitchen amLODipine (NORVASC) 10 MG tablet TAKE 1 TABLET BY MOUTH DAILY 30 tablet 6  . aspirin EC 81 MG tablet Take 81 mg by mouth daily.    . budesonide-formoterol (SYMBICORT) 160-4.5 MCG/ACT inhaler Inhale 2 puffs into the lungs 2 (two) times daily. 1 Inhaler 3  . gabapentin (NEURONTIN) 300 MG capsule TAKE 1 CAPSULE (300 MG TOTAL) BY MOUTH AT BEDTIME. 30 capsule 2  . HYDROcodone-acetaminophen (NORCO/VICODIN) 5-325 MG tablet Take 1 tablet by mouth every 4 (four) hours as needed for moderate pain (Must last 30 days.  Do not take and drive a car or use machinery.). 120 tablet 0  . ipratropium-albuterol (DUONEB) 0.5-2.5 (3) MG/3ML SOLN One inhalation every 8 hours as needed, for severe wheezing 360 mL 3  . pravastatin (PRAVACHOL) 40 MG tablet TAKE 1 TABLET BY MOUTH EVERY EVENING 30 tablet 2  . cyclobenzaprine (FLEXERIL) 10 MG tablet Take 1 tablet (10 mg total) by mouth 3 (three) times daily as needed for muscle spasms. 90 tablet 3   No current facility-administered medications for this visit.     Physical Exam  Blood pressure 150/94, pulse 73, temperature 98.1 F (36.7 C),  height 6' 2.5" (1.892 m), weight 165 lb (74.844 kg).  Constitutional: overall normal hygiene, normal nutrition, well developed, normal grooming, normal body habitus. Assistive device:none  Musculoskeletal: gait and station Limp none, muscle tone and strength are normal, no tremors or atrophy is present.  .  Neurological: coordination overall normal.  Deep tendon reflex/nerve stretch intact.  Sensation normal.  Cranial nerves II-XII intact.   Skin:   normal overall no scars, lesions, ulcers or rashes. No psoriasis.  Psychiatric: Alert and oriented x 3.  Recent memory intact, remote memory unclear.  Normal mood and affect. Well groomed.  Good eye contact.  Cardiovascular:  overall no swelling, no varicosities, no edema bilaterally, normal temperatures of the legs and arms, no clubbing, cyanosis and good capillary refill.  Lymphatic: palpation is normal.  Examination of left Upper Extremity is done.  Inspection:   Overall:  Elbow non-tender without crepitus or defects, forearm non-tender without crepitus or defects, wrist non-tender without crepitus or defects, hand non-tender.    Shoulder: with glenohumeral joint tenderness, without effusion.   Upper arm: without swelling and tenderness   Range of motion:   Overall:  Full range of motion of the elbow, full range of motion of wrist and full range of motion in fingers.   Shoulder:  left  160 degrees forward flexion; 145 degrees abduction; 35 degrees internal rotation, 35 degrees external rotation, 20 degrees extension, 40 degrees adduction.   Stability:   Overall:  Shoulder, elbow and wrist stable   Strength and Tone:   Overall full shoulder muscles strength, full upper arm strength and normal upper arm bulk and tone.  The patient has been educated about the nature of the problem(s) and counseled on treatment options.  The patient appeared to understand what I have discussed and is in agreement with it.  Encounter Diagnosis  Name Primary?  . Left shoulder pain Yes    PLAN Call if any problems.  Precautions discussed.  Continue current medications.   Return to clinic 3 months

## 2016-02-14 NOTE — Patient Instructions (Signed)
Smoking Cessation, Tips for Success If you are ready to quit smoking, congratulations! You have chosen to help yourself be healthier. Cigarettes bring nicotine, tar, carbon monoxide, and other irritants into your body. Your lungs, heart, and blood vessels will be able to work better without these poisons. There are many different ways to quit smoking. Nicotine gum, nicotine patches, a nicotine inhaler, or nicotine nasal spray can help with physical craving. Hypnosis, support groups, and medicines help break the habit of smoking. WHAT THINGS CAN I DO TO MAKE QUITTING EASIER?  Here are some tips to help you quit for good:  Pick a date when you will quit smoking completely. Tell all of your friends and family about your plan to quit on that date.  Do not try to slowly cut down on the number of cigarettes you are smoking. Pick a quit date and quit smoking completely starting on that day.  Throw away all cigarettes.   Clean and remove all ashtrays from your home, work, and car.  On a card, write down your reasons for quitting. Carry the card with you and read it when you get the urge to smoke.  Cleanse your body of nicotine. Drink enough water and fluids to keep your urine clear or pale yellow. Do this after quitting to flush the nicotine from your body.  Learn to predict your moods. Do not let a bad situation be your excuse to have a cigarette. Some situations in your life might tempt you into wanting a cigarette.  Never have "just one" cigarette. It leads to wanting another and another. Remind yourself of your decision to quit.  Change habits associated with smoking. If you smoked while driving or when feeling stressed, try other activities to replace smoking. Stand up when drinking your coffee. Brush your teeth after eating. Sit in a different chair when you read the paper. Avoid alcohol while trying to quit, and try to drink fewer caffeinated beverages. Alcohol and caffeine may urge you to  smoke.  Avoid foods and drinks that can trigger a desire to smoke, such as sugary or spicy foods and alcohol.  Ask people who smoke not to smoke around you.  Have something planned to do right after eating or having a cup of coffee. For example, plan to take a walk or exercise.  Try a relaxation exercise to calm you down and decrease your stress. Remember, you may be tense and nervous for the first 2 weeks after you quit, but this will pass.  Find new activities to keep your hands busy. Play with a pen, coin, or rubber band. Doodle or draw things on paper.  Brush your teeth right after eating. This will help cut down on the craving for the taste of tobacco after meals. You can also try mouthwash.   Use oral substitutes in place of cigarettes. Try using lemon drops, carrots, cinnamon sticks, or chewing gum. Keep them handy so they are available when you have the urge to smoke.  When you have the urge to smoke, try deep breathing.  Designate your home as a nonsmoking area.  If you are a heavy smoker, ask your health care provider about a prescription for nicotine chewing gum. It can ease your withdrawal from nicotine.  Reward yourself. Set aside the cigarette money you save and buy yourself something nice.  Look for support from others. Join a support group or smoking cessation program. Ask someone at home or at work to help you with your plan   to quit smoking.  Always ask yourself, "Do I need this cigarette or is this just a reflex?" Tell yourself, "Today, I choose not to smoke," or "I do not want to smoke." You are reminding yourself of your decision to quit.  Do not replace cigarette smoking with electronic cigarettes (commonly called e-cigarettes). The safety of e-cigarettes is unknown, and some may contain harmful chemicals.  If you relapse, do not give up! Plan ahead and think about what you will do the next time you get the urge to smoke. HOW WILL I FEEL WHEN I QUIT SMOKING? You  may have symptoms of withdrawal because your body is used to nicotine (the addictive substance in cigarettes). You may crave cigarettes, be irritable, feel very hungry, cough often, get headaches, or have difficulty concentrating. The withdrawal symptoms are only temporary. They are strongest when you first quit but will go away within 10-14 days. When withdrawal symptoms occur, stay in control. Think about your reasons for quitting. Remind yourself that these are signs that your body is healing and getting used to being without cigarettes. Remember that withdrawal symptoms are easier to treat than the major diseases that smoking can cause.  Even after the withdrawal is over, expect periodic urges to smoke. However, these cravings are generally short lived and will go away whether you smoke or not. Do not smoke! WHAT RESOURCES ARE AVAILABLE TO HELP ME QUIT SMOKING? Your health care provider can direct you to community resources or hospitals for support, which may include:  Group support.  Education.  Hypnosis.  Therapy.   This information is not intended to replace advice given to you by your health care provider. Make sure you discuss any questions you have with your health care provider.   Document Released: 07/11/2004 Document Revised: 11/03/2014 Document Reviewed: 03/31/2013 Elsevier Interactive Patient Education 2016 Elsevier Inc.  

## 2016-02-28 ENCOUNTER — Telehealth: Payer: Self-pay | Admitting: Orthopaedic Surgery

## 2016-02-28 MED ORDER — HYDROCODONE-ACETAMINOPHEN 5-325 MG PO TABS: 1.0000 | ORAL_TABLET | ORAL | Status: DC | PRN

## 2016-02-28 NOTE — Telephone Encounter (Signed)
Patient requests refill on medication: HYDROcodone-acetaminophen (NORCO/VICODIN) 5-325 MG tablet [id number] - quantity of 120.

## 2016-02-28 NOTE — Telephone Encounter (Signed)
Rx done. 

## 2016-03-03 ENCOUNTER — Ambulatory Visit: Payer: BLUE CROSS/BLUE SHIELD

## 2016-03-03 VITALS — BP 154/80

## 2016-03-03 DIAGNOSIS — I10 Essential (primary) hypertension: Secondary | ICD-10-CM

## 2016-03-03 MED ORDER — SPIRONOLACTONE 25 MG PO TABS: 25.0000 mg | ORAL_TABLET | Freq: Every day | ORAL | Status: DC

## 2016-03-03 NOTE — Progress Notes (Signed)
Patient in for nurse blood pressure check .  Blood pressure checked manually.  Blood pressure elevated.  Verbal order received for Spironolactone 25mg  daily.  Patient aware and will schedule appointment to be seen in 2 months for office visit.  Medication sent to pharmacy.

## 2016-03-03 NOTE — Patient Instructions (Signed)
Starting a new medication for your blood pressure  SPIRONOLACTONE take as directed  Continue to take Amlodipine  Keep next appointment

## 2016-03-04 ENCOUNTER — Ambulatory Visit (INDEPENDENT_AMBULATORY_CARE_PROVIDER_SITE_OTHER): Payer: BLUE CROSS/BLUE SHIELD | Admitting: Urology

## 2016-03-04 DIAGNOSIS — R972 Elevated prostate specific antigen [PSA]: Secondary | ICD-10-CM | POA: Diagnosis not present

## 2016-03-05 ENCOUNTER — Other Ambulatory Visit: Payer: Self-pay | Admitting: Urology

## 2016-03-05 DIAGNOSIS — R972 Elevated prostate specific antigen [PSA]: Secondary | ICD-10-CM

## 2016-03-13 ENCOUNTER — Other Ambulatory Visit: Payer: Self-pay | Admitting: Acute Care

## 2016-03-13 DIAGNOSIS — F17210 Nicotine dependence, cigarettes, uncomplicated: Secondary | ICD-10-CM

## 2016-03-18 ENCOUNTER — Encounter: Payer: Self-pay | Admitting: Acute Care

## 2016-03-18 ENCOUNTER — Ambulatory Visit (HOSPITAL_COMMUNITY)
Admission: RE | Admit: 2016-03-18 | Discharge: 2016-03-18 | Disposition: A | Discharge status: Home / Self Care | Payer: BLUE CROSS/BLUE SHIELD | Source: Ambulatory Visit | Attending: Urology | Admitting: Urology

## 2016-03-18 ENCOUNTER — Encounter (HOSPITAL_COMMUNITY): Payer: Self-pay

## 2016-03-18 DIAGNOSIS — R972 Elevated prostate specific antigen [PSA]: Secondary | ICD-10-CM | POA: Insufficient documentation

## 2016-03-18 MED ORDER — LIDOCAINE HCL (PF) 2 % IJ SOLN
INTRAMUSCULAR | Status: AC
  Administered 2016-03-18: 10 mL
  Filled 2016-03-18: qty 10

## 2016-03-18 MED ORDER — GENTAMICIN SULFATE 40 MG/ML IJ SOLN
INTRAMUSCULAR | Status: AC
  Administered 2016-03-18: 160 mg via INTRAMUSCULAR
  Filled 2016-03-18: qty 4

## 2016-03-18 MED ORDER — LIDOCAINE HCL (PF) 2 % IJ SOLN
10.0000 mL | Freq: Once | INTRAMUSCULAR | Status: AC
  Administered 2016-03-18: 10 mL

## 2016-03-18 MED ORDER — GENTAMICIN SULFATE 40 MG/ML IJ SOLN
160.0000 mg | Freq: Once | INTRAMUSCULAR | Status: AC
  Administered 2016-03-18: 160 mg via INTRAMUSCULAR

## 2016-03-18 NOTE — Discharge Instructions (Signed)
Transrectal Ultrasound-Guided Biopsy °A transrectal ultrasound-guided biopsy is a procedure to take samples of tissue from your prostate. Ultrasound images are used to guide the procedure. It is usually done to check the prostate gland for cancer. °BEFORE THE PROCEDURE °· Do not eat or drink after midnight on the night before your procedure. °· Take medicines as your doctor tells you. °· Your doctor may have you stop taking some medicines 5-7 days before the procedure. °· You will be given an enema before your procedure. During an enema, a liquid is put into your butt (rectum) to clear out waste. °· You may have lab tests the day of your procedure. °· Make plans to have someone drive you home. °PROCEDURE °· You will be given medicine to help you relax before the procedure. An IV tube will be put into one of your veins. It will be used to give fluids and medicine. °· You will be given medicine to reduce the risk of infection (antibiotic). °· You will be placed on your side. °· A probe with gel will be put in your butt. This is used to take pictures of your prostate and the area around it. °· A medicine to numb the area is put into your prostate. °· A biopsy needle is then inserted and guided to your prostate. °· Samples of prostate tissue are taken. The needle is removed. °· The samples are sent to a lab to be checked. Results are usually back in 2-3 days. °AFTER THE PROCEDURE °· You will be taken to a room where you will be watched until you are doing okay. °· You may have some pain in the area around your butt. You will be given medicines for this. °· You may be able to go home the same day. Sometimes, an overnight stay in the hospital is needed. °  °This information is not intended to replace advice given to you by your health care provider. Make sure you discuss any questions you have with your health care provider. °  °Document Released: 10/01/2009 Document Revised: 10/18/2013 Document Reviewed:  06/01/2013 °Elsevier Interactive Patient Education ©2016 Elsevier Inc. ° °

## 2016-03-27 ENCOUNTER — Telehealth: Payer: Self-pay | Admitting: Orthopaedic Surgery

## 2016-03-27 MED ORDER — HYDROCODONE-ACETAMINOPHEN 5-325 MG PO TABS: 1.0000 | ORAL_TABLET | ORAL | Status: DC | PRN

## 2016-03-27 NOTE — Telephone Encounter (Signed)
Rx done. 

## 2016-03-27 NOTE — Telephone Encounter (Signed)
Patient requests a refill on Norco 5-325 mgs.   Qty   120    Sig: Take 1 tablet by mouth every 4 (four) hours as needed for moderate pain (Must last 30 days. Do not take and drive a car or use machinery.).

## 2016-04-02 ENCOUNTER — Ambulatory Visit (INDEPENDENT_AMBULATORY_CARE_PROVIDER_SITE_OTHER)
Admission: RE | Admit: 2016-04-02 | Discharge: 2016-04-02 | Disposition: A | Discharge status: Home / Self Care | Payer: BLUE CROSS/BLUE SHIELD | Source: Ambulatory Visit | Attending: Acute Care | Admitting: Acute Care

## 2016-04-02 ENCOUNTER — Encounter: Payer: Self-pay | Admitting: Acute Care

## 2016-04-02 ENCOUNTER — Ambulatory Visit (INDEPENDENT_AMBULATORY_CARE_PROVIDER_SITE_OTHER): Payer: BLUE CROSS/BLUE SHIELD | Admitting: Acute Care

## 2016-04-02 DIAGNOSIS — F17210 Nicotine dependence, cigarettes, uncomplicated: Secondary | ICD-10-CM

## 2016-04-02 NOTE — Progress Notes (Signed)
Shared Decision Making Visit Lung Cancer Screening Program [id number])   Eligibility:  Age 65 y.o.  Pack Years Smoking History Calculation 36 pack year history (# packs/per year x # years smoked)  Recent History of coughing up blood  no  Unexplained weight loss? no ( >Than 15 pounds within the last 6 months )  Prior History Lung / other cancer no (Diagnosis within the last 5 years already requiring surveillance chest CT Scans).  Smoking Status Current Smoker  Former Smokers: Years since quit:NA Current smoker  Quit Date: NA  Visit Components:  Discussion included one or more decision making aids. yes  Discussion included risk/benefits of screening. yes  Discussion included potential follow up diagnostic testing for abnormal scans. yes  Discussion included meaning and risk of over diagnosis. yes  Discussion included meaning and risk of False Positives. yes  Discussion included meaning of total radiation exposure. yes  Counseling Included:  Importance of adherence to annual lung cancer LDCT screening. yes  Impact of comorbidities on ability to participate in the program. yes  Ability and willingness to under diagnostic treatment. yes  Smoking Cessation Counseling:  Current Smokers:   Discussed importance of smoking cessation. yes  Information about tobacco cessation classes and interventions provided to patient. yes  Patient provided with "ticket" for LDCT Scan. yes  Symptomatic Patient. no  CounselingNA  Diagnosis Code: Tobacco Use Z72.0  Asymptomatic Patient yes  Counseling (Intermediate counseling: > three minutes counseling) [id number]  Former Smokers:   Discussed the importance of maintaining cigarette abstinence. yes  Diagnosis Code: Personal History of Nicotine Dependence. [id number]  Information about tobacco cessation classes and interventions provided to patient. Yes  Patient provided with "ticket" for LDCT Scan. yes  Written Order for Lung Cancer  Screening with LDCT placed in Epic. Yes (CT Chest Lung Cancer Screening Low Dose W/O CM) [id number] Z12.2-Screening of respiratory organs Z87.891-Personal history of nicotine dependence  I have spent 20 minutes of face to face time with Mr.Appling  discussing the risks and benefits of lung cancer screening. We viewed a power point together that explained in detail the above noted topics. We paused at intervals to allow for questions to be asked and answered to ensure understanding.We discussed that the single most powerful action that he can take to decrease his risk of developing lung cancer is to quit smoking. We discussed whether or not he is ready to commit to setting a quit date. He is currently not ready to set a quit date. We discussed options for tools to aid in quitting smoking including nicotine replacement therapy, non-nicotine medications, support groups, Quit Smart classes, and behavior modification. We discussed that often times setting smaller, more achievable goals, such as eliminating 1 cigarette a day for a week and then 2 cigarettes a day for a week can be helpful in slowly decreasing the number of cigarettes smoked. This allows for a sense of accomplishment as well as providing a clinical benefit. I gave him the " Be Stronger Than Your Excuses" card with contact information for community resources, classes, free nicotine replacement therapy, and access to mobile apps, text messaging, and on-line smoking cessation help. I have also given him my card and contact information in the event he needs to contact me. We discussed the time and location of the scan, and that either June Leap, CMA, or I will call with the results within 24-48 hours of receiving them. I have provided him with a copy of the power point we viewed  as a resource in the event they need reinforcement of the concepts we discussed today in the office. The patient verbalized understanding of all of  the above and had no further  questions upon leaving the office. They have my contact information in the event they have any further questions.   Magdalen Spatz, NP

## 2016-04-18 ENCOUNTER — Other Ambulatory Visit (HOSPITAL_COMMUNITY): Payer: Self-pay | Admitting: Pulmonary Disease

## 2016-04-18 DIAGNOSIS — R918 Other nonspecific abnormal finding of lung field: Secondary | ICD-10-CM

## 2016-04-24 ENCOUNTER — Telehealth: Payer: Self-pay | Admitting: Orthopaedic Surgery

## 2016-04-24 MED ORDER — HYDROCODONE-ACETAMINOPHEN 5-325 MG PO TABS: 1.0000 | ORAL_TABLET | ORAL | Status: DC | PRN

## 2016-04-24 NOTE — Telephone Encounter (Signed)
Patient requested a refill on Norco 5-325 mgs.  Qty  120   Sig: Take 1 tablet by mouth every 4 (four) hours as needed for moderate pain (Must last 30 days. Do not take and drive a car or use machinery.).

## 2016-04-24 NOTE — Telephone Encounter (Signed)
Rx done. 

## 2016-04-30 ENCOUNTER — Encounter: Payer: Self-pay | Admitting: Family Medicine

## 2016-04-30 ENCOUNTER — Ambulatory Visit (INDEPENDENT_AMBULATORY_CARE_PROVIDER_SITE_OTHER): Payer: BLUE CROSS/BLUE SHIELD | Admitting: Family Medicine

## 2016-04-30 VITALS — BP 150/80 | HR 78 | Resp 18 | Ht 74.0 in | Wt 165.0 lb

## 2016-04-30 DIAGNOSIS — J449 Chronic obstructive pulmonary disease, unspecified: Secondary | ICD-10-CM

## 2016-04-30 DIAGNOSIS — R918 Other nonspecific abnormal finding of lung field: Secondary | ICD-10-CM

## 2016-04-30 DIAGNOSIS — Z5329 Procedure and treatment not carried out because of patient's decision for other reasons: Secondary | ICD-10-CM

## 2016-04-30 DIAGNOSIS — R972 Elevated prostate specific antigen [PSA]: Secondary | ICD-10-CM | POA: Insufficient documentation

## 2016-04-30 DIAGNOSIS — M5410 Radiculopathy, site unspecified: Secondary | ICD-10-CM

## 2016-04-30 DIAGNOSIS — Z91199 Patient's noncompliance with other medical treatment and regimen due to unspecified reason: Secondary | ICD-10-CM

## 2016-04-30 DIAGNOSIS — E785 Hyperlipidemia, unspecified: Secondary | ICD-10-CM | POA: Diagnosis not present

## 2016-04-30 DIAGNOSIS — F17200 Nicotine dependence, unspecified, uncomplicated: Secondary | ICD-10-CM

## 2016-04-30 DIAGNOSIS — I10 Essential (primary) hypertension: Secondary | ICD-10-CM | POA: Diagnosis not present

## 2016-04-30 MED ORDER — SPIRONOLACTONE 50 MG PO TABS: 50.0000 mg | ORAL_TABLET | Freq: Every day | ORAL | Status: DC

## 2016-04-30 NOTE — Progress Notes (Signed)
Subjective:    Patient ID: Dillon [name], male    DOB: January 07, 1951, 65 y.o.   MRN: [id number]  HPI   Dillon [name]     MRN: [id number]      DOB: [birth year]   HPI Dillon [name] is here for follow up and re-evaluation of chronic medical conditions, medication management and review of any available recent lab and radiology data.  Preventive health is updated, specifically  Cancer screening and Immunization.   Questions or concerns regarding consultations or procedures which the PT has had in the interim are  addressed. The PT denies any adverse reactions to current medications since the last visit.  There are no new concerns.  There are no specific complaints   ROS Denies recent fever or chills. Denies sinus pressure, nasal congestion, ear pain or sore throat. Denies chest congestion, productive cough or wheezing. Denies chest pains, palpitations and leg swelling Denies abdominal pain, nausea, vomiting,diarrhea or constipation.   Denies dysuria, frequency, hesitancy or incontinence. Denies joint pain, swelling and limitation in mobility. Denies headaches, seizures, numbness, or tingling. Denies depression, anxiety or insomnia. Denies skin break down or rash.   PE  BP 150/80 mmHg  Pulse 78  Resp 18  Ht 6\' 2"  (1.88 m)  Wt 165 lb (74.844 kg)  BMI 21.18 kg/m2  SpO2 96%  Patient alert and oriented and in no cardiopulmonary distress.  HEENT: No facial asymmetry, EOMI,   oropharynx pink and moist.  Neck supple no JVD, no mass.  Chest: Clear to auscultation bilaterally.Decreased air entry bilaterally  CVS: S1, S2 no murmurs, no S3.Regular rate.  ABD: Soft non tender.   Ext: No edema  MS: Adequate ROM spine, shoulders, hips and knees.  Skin: Intact, no ulcerations or rash noted.  Psych: Good eye contact, normal affect. Memory intact not anxious or depressed appearing.  CNS: CN 2-12 intact, power,  normal throughout.no focal deficits noted.   Assessment &  Plan  Essential hypertension Uncontrolled, increase dose of spironolactione DASH diet and commitment to daily physical activity for a minimum of 30 minutes discussed and encouraged, as a part of hypertension management. The importance of attaining a healthy weight is also discussed.  BP/Weight 04/30/2016 03/18/2016 03/03/2016 02/14/2016 01/17/2016 01/02/2016 [id number]  Systolic BP Q000111Q 0000000 123456 Q000111Q [id number] 123456 [id number]  Diastolic BP 80 94 80 94 80 80 78  Wt. (Lbs) 165 - - 165 169 167 162  BMI 21.18 - - 20.91 21.69 21.43 20.79        Hyperlipidemia Controlled, no change in medication Hyperlipidemia:Low fat diet discussed and encouraged.   Lipid Panel  Lab Results  Component Value Date   CHOL 166 01/24/2016   HDL 66 01/24/2016   LDLCALC 94 01/24/2016   TRIG 29 01/24/2016   CHOLHDL 2.5 01/24/2016        NICOTINE ADDICTION Patient counseled for approximately 5 minutes regarding the health risks of ongoing nicotine use, specifically all types of cancer, heart disease, stroke and respiratory failure. The options available for help with cessation ,the behavioral changes to assist the process, and the option to either gradully reduce usage  Or abruptly stop.is also discussed. Pt is also encouraged to set specific goals in number of cigarettes used daily, as well as to set a quit date.     Patient non-compliant, refused service Refusing colonoscopy at this time still, encouraged to get this done before end of the year  Lung nodules Followed by [facility] chest scan in am, encouraged  to quit nicotine use  Back pain with left-sided radiculopathy Adequately controlled,  Managed by orthopedics  COPD (chronic obstructive pulmonary disease) Unchanged, needs to quit smoking, counseling done  Elevated PSA Managed by urology, normal biopsy in 2017       Review of Systems     Objective:   Physical Exam        Assessment & Plan:

## 2016-04-30 NOTE — Assessment & Plan Note (Signed)
Uncontrolled, increase dose of spironolactione DASH diet and commitment to daily physical activity for a minimum of 30 minutes discussed and encouraged, as a part of hypertension management. The importance of attaining a healthy weight is also discussed.  BP/Weight 04/30/2016 03/18/2016 03/03/2016 02/14/2016 01/17/2016 01/02/2016 [id number]  Systolic BP Q000111Q 0000000 123456 Q000111Q [id number] 123456 [id number]  Diastolic BP 80 94 80 94 80 80 78  Wt. (Lbs) 165 - - 165 169 167 162  BMI 21.18 - - 20.91 21.69 21.43 20.79

## 2016-04-30 NOTE — Assessment & Plan Note (Signed)

## 2016-04-30 NOTE — Assessment & Plan Note (Signed)
Refusing colonoscopy at this time still, encouraged to get this done before end of the year

## 2016-04-30 NOTE — Assessment & Plan Note (Signed)
Unchanged, needs to quit smoking, counseling done

## 2016-04-30 NOTE — Assessment & Plan Note (Signed)
Followed by [facility] chest scan in am, encouraged to quit nicotine use

## 2016-04-30 NOTE — Assessment & Plan Note (Signed)
Controlled, no change in medication Hyperlipidemia:Low fat diet discussed and encouraged.   Lipid Panel  Lab Results  Component Value Date   CHOL 166 01/24/2016   HDL 66 01/24/2016   LDLCALC 94 01/24/2016   TRIG 29 01/24/2016   CHOLHDL 2.5 01/24/2016

## 2016-04-30 NOTE — Patient Instructions (Signed)
CPE in 4 month, call if you need me sooner  INCREASE spironolactone to 50 mg daily, OK to take TWO 25 mg tablets daily together till done  Continue to work on health  Thankful prostate biopsy showed no cancer  Continue to work on [place]    Thank you  for choosing [facility]. We consider it a privelige to serve you.  Delivering excellent health care in a caring and  compassionate way is our goal.  Partnering with you,  so that together we can achieve this goal is our strategy.

## 2016-04-30 NOTE — Assessment & Plan Note (Signed)
Adequately controlled,  Managed by orthopedics

## 2016-04-30 NOTE — Assessment & Plan Note (Signed)
Managed by urology, normal biopsy in 2017

## 2016-05-01 ENCOUNTER — Ambulatory Visit (HOSPITAL_COMMUNITY)
Admission: RE | Admit: 2016-05-01 | Discharge: 2016-05-01 | Disposition: A | Discharge status: Home / Self Care | Payer: BLUE CROSS/BLUE SHIELD | Source: Ambulatory Visit | Attending: Pulmonary Disease | Admitting: Pulmonary Disease

## 2016-05-01 DIAGNOSIS — R918 Other nonspecific abnormal finding of lung field: Secondary | ICD-10-CM

## 2016-05-15 ENCOUNTER — Ambulatory Visit: Payer: BLUE CROSS/BLUE SHIELD | Admitting: Orthopaedic Surgery

## 2016-05-20 ENCOUNTER — Ambulatory Visit (INDEPENDENT_AMBULATORY_CARE_PROVIDER_SITE_OTHER): Payer: BLUE CROSS/BLUE SHIELD | Admitting: Orthopaedic Surgery

## 2016-05-20 ENCOUNTER — Encounter: Payer: Self-pay | Admitting: Orthopaedic Surgery

## 2016-05-20 VITALS — BP 119/77 | HR 74 | Temp 97.7°F | Ht 73.5 in | Wt 163.2 lb

## 2016-05-20 DIAGNOSIS — F17200 Nicotine dependence, unspecified, uncomplicated: Secondary | ICD-10-CM | POA: Diagnosis not present

## 2016-05-20 DIAGNOSIS — M25512 Pain in left shoulder: Secondary | ICD-10-CM

## 2016-05-20 NOTE — Progress Notes (Signed)
Patient [id number] Dillon [name], male DOB:1951/03/15, 65 y.o. [id number]  Chief Complaint  Patient presents with  . Follow-up    Left shoulder    HPI  Dillon [name] is a 65 y.o. male who has chronic left shoulder pain. He is working daily.  He has been doing his exercises.  He has no new trauma, no paresthesias.   HPI  Body mass index is 21.24 kg/m.  ROS  Review of Systems  Respiratory: Negative for cough and shortness of breath.   Cardiovascular: Negative for chest pain and leg swelling.  Endocrine: Negative for cold intolerance.  Musculoskeletal: Positive for arthralgias and myalgias.  Allergic/Immunologic: Positive for environmental allergies.    Past Medical History:  Diagnosis Date  . Asthma   . COPD (chronic obstructive pulmonary disease) ([place])   . Hypertension   . Nicotine addiction     Past Surgical History:  Procedure Laterality Date  . KNEE ARTHROSCOPY     both knees     Family History  Problem Relation Age of Onset  . Heart attack Mother   . Diabetes Mother   . Hypertension Mother   . Hypertension Sister   . Hypertension Brother     Social History Social History  Substance Use Topics  . Smoking status: Current Some Day Smoker    Packs/day: 0.75    Years: 48.00    Types: Cigarettes    Start date: 10/16/1969  . Smokeless tobacco: Never Used     Comment: 5 per day--07/10/15 patient reported smoking 12 cigs/day (uses vapor device)  . Alcohol use 0.0 oz/week     Comment: occassional , max of 72 oz pwr month    Allergies  Allergen Reactions  . Penicillins Hives    Current Outpatient Prescriptions  Medication Sig Dispense Refill  . albuterol (VENTOLIN HFA) 108 (90 BASE) MCG/ACT inhaler Inhale into the lungs every 6 (six) hours as needed for wheezing or shortness of breath.    Marland Kitchen amLODipine (NORVASC) 10 MG tablet TAKE 1 TABLET BY MOUTH DAILY 30 tablet 6  . aspirin EC 81 MG tablet Take 81 mg by mouth daily.    . budesonide-formoterol (SYMBICORT)  160-4.5 MCG/ACT inhaler Inhale 2 puffs into the lungs 2 (two) times daily. 1 Inhaler 3  . cyclobenzaprine (FLEXERIL) 10 MG tablet Take 1 tablet (10 mg total) by mouth 3 (three) times daily as needed for muscle spasms. 90 tablet 3  . gabapentin (NEURONTIN) 300 MG capsule TAKE 1 CAPSULE (300 MG TOTAL) BY MOUTH AT BEDTIME. 30 capsule 2  . HYDROcodone-acetaminophen (NORCO/VICODIN) 5-325 MG tablet Take 1 tablet by mouth every 4 (four) hours as needed for moderate pain (Must last 30 days.  Do not take and drive a car or use machinery.). 120 tablet 0  . ipratropium-albuterol (DUONEB) 0.5-2.5 (3) MG/3ML SOLN One inhalation every 8 hours as needed, for severe wheezing 360 mL 3  . pravastatin (PRAVACHOL) 40 MG tablet TAKE 1 TABLET BY MOUTH EVERY EVENING 30 tablet 2  . spironolactone (ALDACTONE) 50 MG tablet Take 1 tablet (50 mg total) by mouth daily. 30 tablet 4   No current facility-administered medications for this visit.      Physical Exam  Blood pressure 119/77, pulse 74, temperature 97.7 F (36.5 C), height 6' 1.5" (1.867 m), weight 163 lb 3.2 oz (74 kg).  Constitutional: overall normal hygiene, normal nutrition, well developed, normal grooming, normal body habitus. Assistive device:none  Musculoskeletal: gait and station Limp none, muscle tone and strength are normal, no  tremors or atrophy is present.  .  Neurological: coordination overall normal.  Deep tendon reflex/nerve stretch intact.  Sensation normal.  Cranial nerves II-XII intact.   Skin:   normal overall no scars, lesions, ulcers or rashes. No psoriasis.  Psychiatric: Alert and oriented x 3.  Recent memory intact, remote memory unclear.  Normal mood and affect. Well groomed.  Good eye contact.  Cardiovascular: overall no swelling, no varicosities, no edema bilaterally, normal temperatures of the legs and arms, no clubbing, cyanosis and good capillary refill.  Lymphatic: palpation is normal.  Examination of left Upper Extremity is  done.  Inspection:   Overall:  Elbow non-tender without crepitus or defects, forearm non-tender without crepitus or defects, wrist non-tender without crepitus or defects, hand non-tender.    Shoulder: with glenohumeral joint tenderness, without effusion.   Upper arm: without swelling and tenderness   Range of motion:   Overall:  Full range of motion of the elbow, full range of motion of wrist and full range of motion in fingers.   Shoulder:  left  165 degrees forward flexion; 150 degrees abduction; 35 degrees internal rotation, 35 degrees external rotation, 20 degrees extension, 40 degrees adduction.   Stability:   Overall:  Shoulder, elbow and wrist stable   Strength and Tone:   Overall full shoulder muscles strength, full upper arm strength and normal upper arm bulk and tone.   The patient has been educated about the nature of the problem(s) and counseled on treatment options.  The patient appeared to understand what I have discussed and is in agreement with it.  Encounter Diagnoses  Name Primary?  . Left shoulder pain Yes  . Tobacco use disorder     PLAN Call if any problems.  Precautions discussed.  Continue current medications.   Return to clinic 3 months   Electronically Signed Sanjuana Kava, MD 7/25/20179:14 AM

## 2016-05-26 ENCOUNTER — Telehealth: Payer: Self-pay | Admitting: Orthopaedic Surgery

## 2016-05-26 MED ORDER — HYDROCODONE-ACETAMINOPHEN 5-325 MG PO TABS: 1.0000 | ORAL_TABLET | ORAL | 0 refills | Status: DC | PRN

## 2016-05-26 NOTE — Telephone Encounter (Signed)
Hydrocodone-Acetaminophen  5/325mg  Qty 120 Tablets °

## 2016-06-07 ENCOUNTER — Other Ambulatory Visit: Payer: Self-pay | Admitting: Family Medicine

## 2016-06-23 ENCOUNTER — Telehealth: Payer: Self-pay | Admitting: Orthopaedic Surgery

## 2016-06-23 MED ORDER — HYDROCODONE-ACETAMINOPHEN 5-325 MG PO TABS: 1.0000 | ORAL_TABLET | Freq: Four times a day (QID) | ORAL | 0 refills | Status: DC | PRN

## 2016-06-23 NOTE — Telephone Encounter (Signed)
Hydrocodone-Acetaminophen  5/325mg  Qty 120 Tablets °

## 2016-06-27 ENCOUNTER — Other Ambulatory Visit: Payer: Self-pay | Admitting: Family Medicine

## 2016-06-28 LAB — BASIC METABOLIC PANEL
BUN: 14 mg/dL (ref 7–25)
CO2: 30 mmol/L (ref 20–31)
Calcium: 9 mg/dL (ref 8.6–10.3)
Chloride: 102 mmol/L (ref 98–110)
Creat: 1.01 mg/dL (ref 0.70–1.25)
Glucose, Bld: 67 mg/dL (ref 65–99)
Potassium: 4.3 mmol/L (ref 3.5–5.3)
Sodium: 139 mmol/L (ref 135–146)

## 2016-06-28 LAB — TSH: TSH: 1.3 mIU/L (ref 0.40–4.50)

## 2016-07-01 ENCOUNTER — Ambulatory Visit (INDEPENDENT_AMBULATORY_CARE_PROVIDER_SITE_OTHER): Payer: BLUE CROSS/BLUE SHIELD | Admitting: Family Medicine

## 2016-07-01 ENCOUNTER — Encounter: Payer: Self-pay | Admitting: Family Medicine

## 2016-07-01 ENCOUNTER — Other Ambulatory Visit: Payer: Self-pay | Admitting: Family Medicine

## 2016-07-01 VITALS — BP 130/80 | HR 76 | Resp 16 | Ht 74.0 in | Wt 163.0 lb

## 2016-07-01 DIAGNOSIS — Z1211 Encounter for screening for malignant neoplasm of colon: Secondary | ICD-10-CM | POA: Diagnosis not present

## 2016-07-01 DIAGNOSIS — Z91199 Patient's noncompliance with other medical treatment and regimen due to unspecified reason: Secondary | ICD-10-CM

## 2016-07-01 DIAGNOSIS — E785 Hyperlipidemia, unspecified: Secondary | ICD-10-CM | POA: Diagnosis not present

## 2016-07-01 DIAGNOSIS — Z5329 Procedure and treatment not carried out because of patient's decision for other reasons: Secondary | ICD-10-CM

## 2016-07-01 DIAGNOSIS — I10 Essential (primary) hypertension: Secondary | ICD-10-CM

## 2016-07-01 DIAGNOSIS — Z0000 Encounter for general adult medical examination without abnormal findings: Secondary | ICD-10-CM

## 2016-07-01 DIAGNOSIS — J441 Chronic obstructive pulmonary disease with (acute) exacerbation: Secondary | ICD-10-CM

## 2016-07-01 DIAGNOSIS — F17200 Nicotine dependence, unspecified, uncomplicated: Secondary | ICD-10-CM

## 2016-07-01 LAB — POC HEMOCCULT BLD/STL (OFFICE/1-CARD/DIAGNOSTIC): Fecal Occult Blood, POC: NEGATIVE

## 2016-07-01 NOTE — Patient Instructions (Signed)
F/u in 5 months, call if you ned me sooner   Fasting lipid, cmp , cBC in 5 month  . Need to stop smoking  Thank you  for choosing [facility]. We consider it a privelige to serve you.  Delivering excellent health care in a caring and  compassionate way is our goal.  Partnering with you,  so that together we can achieve this goal is our strategy.

## 2016-07-01 NOTE — Assessment & Plan Note (Signed)
Refusing flu vaccine and colonoscopy again at this visit, re educated and encouraged to reconsider both

## 2016-07-01 NOTE — Assessment & Plan Note (Signed)

## 2016-07-01 NOTE — Progress Notes (Signed)
   Dillon [name]     MRN: [id number]      DOB: 04-14-1951   HPI: Patient is in for annual physical exam. No other health concerns are expressed or addressed at the visit. Recent labs, if available are reviewed. Immunization is reviewed , and  updated if needed.    PE; Pleasant male, alert and oriented x 3, in no cardio-pulmonary distress. Afebrile. HEENT No facial trauma or asymetry. Sinuses non tender. EOMI, pupils equally reactive to light. External ears normal, tympanic membranes clear. Oropharynx moist, no exudate. Neck: supple, no adenopathy,JVD or thyromegaly.No bruits.  Chest: Clear to ascultation bilaterally.No crackles or wheezes. Non tender to palpation Decreased air entry bilaterally  Breast: No asymetry,no masses. No nipple discharge or inversion. No axillary or supraclavicular adenopathy  Cardiovascular system; Heart sounds normal,  S1 and  S2 ,no S3.  No murmur, or thrill. Apical beat not displaced Peripheral pulses normal.  Abdomen: Soft, non tender, no organomegaly or masses. No bruits. Bowel sounds normal. No guarding, tenderness or rebound.  Rectal:  Normal sphincter tone. No hemorrhoids or  masses. guaiac negative stool. Prostate smooth and firm    Musculoskeletal exam: Full ROM of spine, hips , shoulders and knees. No deformity ,swelling or crepitus noted. No muscle wasting or atrophy.   Neurologic: Cranial nerves 2 to 12 intact. Power, tone ,sensation and reflexes normal throughout. No disturbance in gait. No tremor.  Skin: Intact, no ulceration, erythema , scaling or rash noted. Pigmentation normal throughout  Psych; Normal mood and affect. Judgement and concentration normal   Assessment & Plan:  Annual physical exam Annual exam as documented. Counseling done  re healthy lifestyle involving commitment to 150 minutes exercise per week, heart healthy diet, and attaining healthy weight.The importance of adequate sleep also  discussed. Regular seat belt use and home safety, is also discussed. Changes in health habits are decided on by the patient with goals and time frames  set for achieving them. Immunization and cancer screening needs are specifically addressed at this visit.   NICOTINE ADDICTION Patient counseled for approximately 5 minutes regarding the health risks of ongoing nicotine use, specifically all types of cancer, heart disease, stroke and respiratory failure. The options available for help with cessation ,the behavioral changes to assist the process, and the option to either gradully reduce usage  Or abruptly stop.is also discussed. Pt is also encouraged to set specific goals in number of cigarettes used daily, as well as to set a quit date.     Patient non-compliant, refused service Refusing flu vaccine and colonoscopy again at this visit, re educated and encouraged to reconsider both

## 2016-07-01 NOTE — Assessment & Plan Note (Signed)

## 2016-07-03 LAB — LIPID PANEL
Cholesterol: 158 mg/dL (ref 125–200)
HDL: 54 mg/dL (ref >=40)
LDL Cholesterol: 93 mg/dL (ref <130)
Total CHOL/HDL Ratio: 2.9 Ratio (ref <=5.0)
Triglycerides: 55 mg/dL (ref <150)
VLDL: 11 mg/dL (ref <30)

## 2016-07-04 ENCOUNTER — Other Ambulatory Visit: Payer: Self-pay

## 2016-07-04 DIAGNOSIS — I10 Essential (primary) hypertension: Secondary | ICD-10-CM

## 2016-07-04 MED ORDER — SPIRONOLACTONE 50 MG PO TABS: 50.0000 mg | ORAL_TABLET | Freq: Every day | ORAL | 1 refills | Status: DC

## 2016-07-04 MED ORDER — AMLODIPINE BESYLATE 10 MG PO TABS: 10.0000 mg | ORAL_TABLET | Freq: Every day | ORAL | 1 refills | Status: DC

## 2016-07-20 ENCOUNTER — Telehealth: Payer: Self-pay | Admitting: Orthopaedic Surgery

## 2016-07-23 ENCOUNTER — Telehealth: Payer: Self-pay | Admitting: Family Medicine

## 2016-07-23 ENCOUNTER — Other Ambulatory Visit: Payer: Self-pay

## 2016-07-23 ENCOUNTER — Telehealth: Payer: Self-pay | Admitting: Orthopaedic Surgery

## 2016-07-23 MED ORDER — HYDROCODONE-ACETAMINOPHEN 5-325 MG PO TABS: 1.0000 | ORAL_TABLET | Freq: Four times a day (QID) | ORAL | 0 refills | Status: DC | PRN

## 2016-07-23 MED ORDER — PRAVASTATIN SODIUM 40 MG PO TABS
40.0000 mg | ORAL_TABLET | Freq: Every evening | ORAL | 3 refills | Status: DC
Start: 2016-07-23 — End: 2016-11-11

## 2016-07-23 NOTE — Telephone Encounter (Signed)
Dillon [name] is stating that he needs a medication refill on his pravastatin (PRAVACHOL) 40 MG tablet called to [facility] in [place]

## 2016-07-23 NOTE — Telephone Encounter (Signed)
Patient states needs refill: HYDROcodone-acetaminophen (NORCO/VICODIN) 5-325 MG tablet 110 tablet   - insurance [employer]

## 2016-07-23 NOTE — Telephone Encounter (Signed)
Med refilled.

## 2016-08-01 DIAGNOSIS — Z0289 Encounter for other administrative examinations: Secondary | ICD-10-CM

## 2016-08-20 ENCOUNTER — Ambulatory Visit (INDEPENDENT_AMBULATORY_CARE_PROVIDER_SITE_OTHER): Payer: BLUE CROSS/BLUE SHIELD | Admitting: Orthopaedic Surgery

## 2016-08-20 ENCOUNTER — Encounter: Payer: Self-pay | Admitting: Orthopaedic Surgery

## 2016-08-20 VITALS — BP 140/82 | HR 70 | Temp 97.7°F | Ht 73.8 in | Wt 162.4 lb

## 2016-08-20 DIAGNOSIS — G8929 Other chronic pain: Secondary | ICD-10-CM

## 2016-08-20 DIAGNOSIS — F17210 Nicotine dependence, cigarettes, uncomplicated: Secondary | ICD-10-CM | POA: Diagnosis not present

## 2016-08-20 DIAGNOSIS — M25512 Pain in left shoulder: Secondary | ICD-10-CM

## 2016-08-20 MED ORDER — HYDROCODONE-ACETAMINOPHEN 5-325 MG PO TABS: 1.0000 | ORAL_TABLET | Freq: Four times a day (QID) | ORAL | 0 refills | Status: DC | PRN

## 2016-08-20 NOTE — Progress Notes (Signed)
[name], male DOB:[birth year], 65 y.o. [id number]  Chief Complaint  Patient presents with  . Follow-up    left shoulder pain    HPI  Dillon [name] is a 64 y.o. male who has chronic left shoulder pain.  He has no new trauma. He has been doing his exercises. It hurts more with overhead use and cold days.  He has no numbness. HPI  Body mass index is 20.96 kg/m.  ROS  Review of Systems  Respiratory: Negative for cough and shortness of breath.   Cardiovascular: Negative for chest pain and leg swelling.  Endocrine: Negative for cold intolerance.  Musculoskeletal: Positive for arthralgias and myalgias.  Allergic/Immunologic: Positive for environmental allergies.    Past Medical History:  Diagnosis Date  . Asthma   . COPD (chronic obstructive pulmonary disease) ([place])   . Hypertension   . Nicotine addiction     Past Surgical History:  Procedure Laterality Date  . KNEE ARTHROSCOPY     both knees     Family History  Problem Relation Age of Onset  . Heart attack Mother   . Diabetes Mother   . Hypertension Mother   . Stroke Brother   . Hypertension Sister   . Hypertension Sister   . Hypertension Sister   . Hypertension Brother     Social History Social History  Substance Use Topics  . Smoking status: Current Some Day Smoker    Packs/day: 0.75    Years: 48.00    Types: Cigarettes    Start date: 10/16/1969  . Smokeless tobacco: Never Used     Comment: 2-3 per day  . Alcohol use 0.0 oz/week     Comment: occassional , max of 72 oz pwr month    Allergies  Allergen Reactions  . Penicillins Hives    Current Outpatient Prescriptions  Medication Sig Dispense Refill  . albuterol (VENTOLIN HFA) 108 (90 BASE) MCG/ACT inhaler Inhale into the lungs every 6 (six) hours as needed for wheezing or shortness of breath.    Marland Kitchen amLODipine (NORVASC) 10 MG tablet Take 1 tablet (10 mg total) by mouth daily. 90 tablet 1  . aspirin EC 81 MG tablet Take 81 mg by  mouth daily.    . cyclobenzaprine (FLEXERIL) 10 MG tablet Take 1 tablet (10 mg total) by mouth at bedtime. 30 tablet 1  . gabapentin (NEURONTIN) 300 MG capsule TAKE 1 CAPSULE (300 MG TOTAL) BY MOUTH AT BEDTIME. 30 capsule 2  . HYDROcodone-acetaminophen (NORCO/VICODIN) 5-325 MG tablet Take 1 tablet by mouth every 6 (six) hours as needed for moderate pain (Must last 30 days.Do not take and drive a car or use machinery.). 100 tablet 0  . ipratropium-albuterol (DUONEB) 0.5-2.5 (3) MG/3ML SOLN One inhalation every 8 hours as needed, for severe wheezing 360 mL 3  . pravastatin (PRAVACHOL) 40 MG tablet Take 1 tablet (40 mg total) by mouth every evening. 30 tablet 3  . spironolactone (ALDACTONE) 50 MG tablet Take 1 tablet (50 mg total) by mouth daily. 90 tablet 1  . SYMBICORT 160-4.5 MCG/ACT inhaler INHALE 2 PUFFS INTO THE LUNGS 2 (TWO) TIMES DAILY. 10.2 Inhaler 1   No current facility-administered medications for this visit.      Physical Exam  Blood pressure 140/82, pulse 70, temperature 97.7 F (36.5 C), height 6' 1.8" (1.875 m), weight 162 lb 6.4 oz (73.7 kg).  Constitutional: overall normal hygiene, normal nutrition, well developed, normal grooming, normal body habitus. Assistive device:none  Musculoskeletal: gait and station  Limp none, muscle tone and strength are normal, no tremors or atrophy is present.  .  Neurological: coordination overall normal.  Deep tendon reflex/nerve stretch intact.  Sensation normal.  Cranial nerves II-XII intact.   Skin:   Normal overall no scars, lesions, ulcers or rashes. No psoriasis.  Psychiatric: Alert and oriented x 3.  Recent memory intact, remote memory unclear.  Normal mood and affect. Well groomed.  Good eye contact.  Cardiovascular: overall no swelling, no varicosities, no edema bilaterally, normal temperatures of the legs and arms, no clubbing, cyanosis and good capillary refill.  Lymphatic: palpation is normal.  Examination of left Upper  Extremity is done.  Inspection:   Overall:  Elbow non-tender without crepitus or defects, forearm non-tender without crepitus or defects, wrist non-tender without crepitus or defects, hand non-tender.    Shoulder: with glenohumeral joint tenderness, without effusion.   Upper arm: with swelling and tenderness   Range of motion:   Overall:  Full range of motion of the elbow, full range of motion of wrist and full range of motion in fingers.   Shoulder:  left  180 degrees forward flexion; 165 degrees abduction; 35 degrees internal rotation, 35 degrees external rotation, 20 degrees extension, 40 degrees adduction.   Stability:   Overall:  Shoulder, elbow and wrist stable   Strength and Tone:   Overall full shoulder muscles strength, full upper arm strength and normal upper arm bulk and tone.   The patient has been educated about the nature of the problem(s) and counseled on treatment options.  The patient appeared to understand what I have discussed and is in agreement with it.  Encounter Diagnoses  Name Primary?  . Chronic left shoulder pain Yes  . Cigarette nicotine dependence without complication     PLAN Call if any problems.  Precautions discussed.  Continue current medications.   Return to clinic 3 months   Electronically Signed Sanjuana Kava, MD 10/25/201710:22 AM

## 2016-08-28 ENCOUNTER — Other Ambulatory Visit: Payer: Self-pay | Admitting: Family Medicine

## 2016-08-28 DIAGNOSIS — J441 Chronic obstructive pulmonary disease with (acute) exacerbation: Secondary | ICD-10-CM

## 2016-09-02 ENCOUNTER — Ambulatory Visit (INDEPENDENT_AMBULATORY_CARE_PROVIDER_SITE_OTHER): Payer: BLUE CROSS/BLUE SHIELD | Admitting: Urology

## 2016-09-02 DIAGNOSIS — N400 Enlarged prostate without lower urinary tract symptoms: Secondary | ICD-10-CM

## 2016-09-02 DIAGNOSIS — R972 Elevated prostate specific antigen [PSA]: Secondary | ICD-10-CM | POA: Diagnosis not present

## 2016-09-12 ENCOUNTER — Encounter: Payer: Self-pay | Admitting: Orthopaedic Surgery

## 2016-09-15 ENCOUNTER — Telehealth: Payer: Self-pay | Admitting: Orthopaedic Surgery

## 2016-09-16 MED ORDER — HYDROCODONE-ACETAMINOPHEN 5-325 MG PO TABS: 1.0000 | ORAL_TABLET | Freq: Four times a day (QID) | ORAL | 0 refills | Status: DC | PRN

## 2016-09-23 ENCOUNTER — Telehealth: Payer: Self-pay | Admitting: Acute Care

## 2016-09-23 DIAGNOSIS — F17210 Nicotine dependence, cigarettes, uncomplicated: Secondary | ICD-10-CM

## 2016-09-23 NOTE — Telephone Encounter (Signed)
I have attempted to call Mr. Dillon [name] with the results of his low-dose screening CT. I did reach his answering machine and left a message stating that his scan was within normal limits. It was read as a lung RADS 2 indicating nodules that are benign in appearance and behavior. I told him we will order and schedule his scan for June 2018 which is the recommendation of radiology. Left my contact information in the event he has any further questions.

## 2016-10-01 IMAGING — MR MR LUMBAR SPINE W/O CM
4 of 6 series · 15 of 48 positions shown · non-contrast
Comparison: None.

CLINICAL DATA: Right-sided low back pain extending through the
right lower extremity with pain and numbness since late October 2014.

EXAM:
MRI LUMBAR SPINE WITHOUT CONTRAST
TECHNIQUE: Multiplanar, multisequence MR imaging of the lumbar spine was
performed. No intravenous contrast was administered.

[Series 3: T2 · sagittal · 4.0mm · 0.72mm/px · 6 of 17 slices shown (1 of 3)]
[im 1/17]
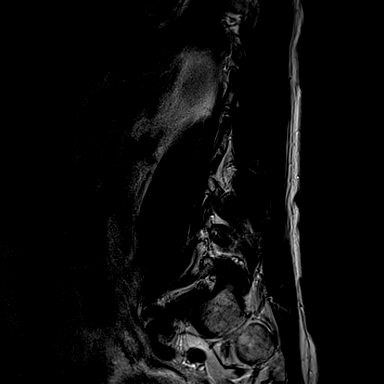
[im 4/17]
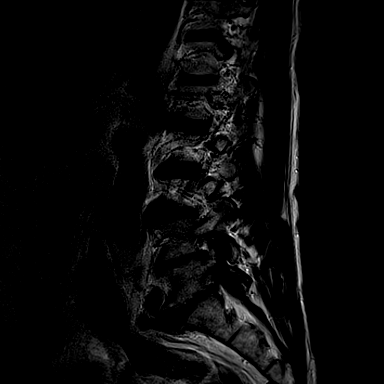
[im 7/17]
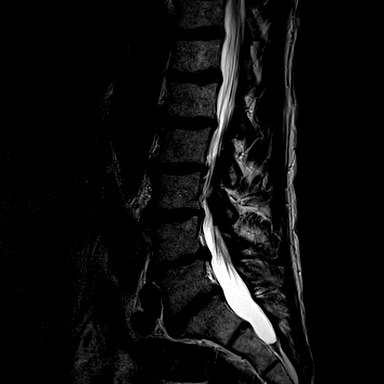
[im 10/17]
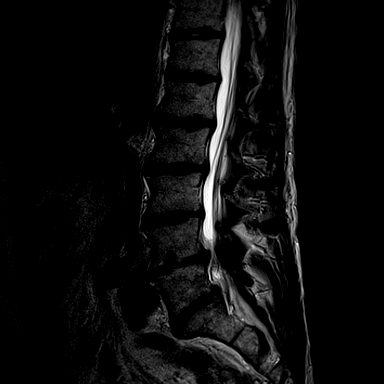
[im 13/17]
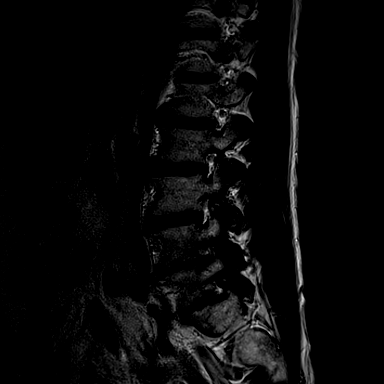
[im 17/17]
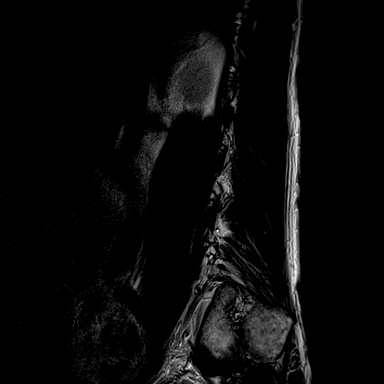

[Series 4: T1 · sagittal · 4.0mm · 0.35mm/px · 3 of 17 slices shown]
[im 4/17]
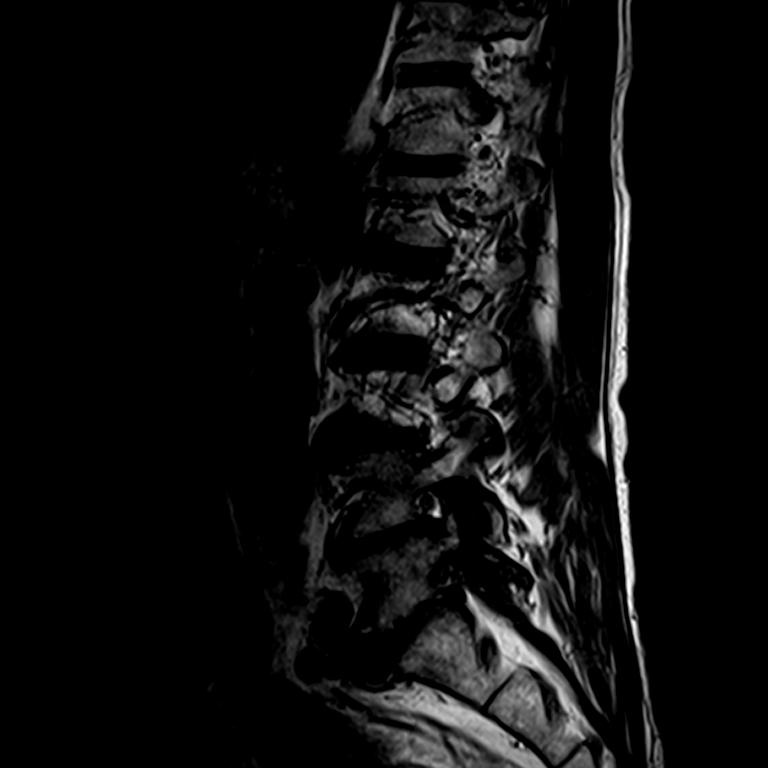
[im 10/17]
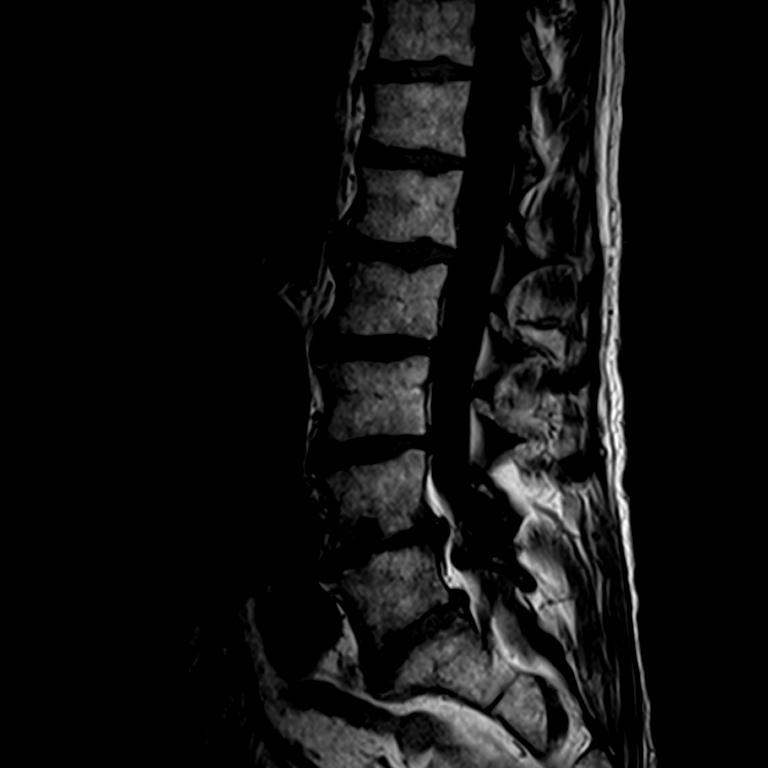
[im 17/17]
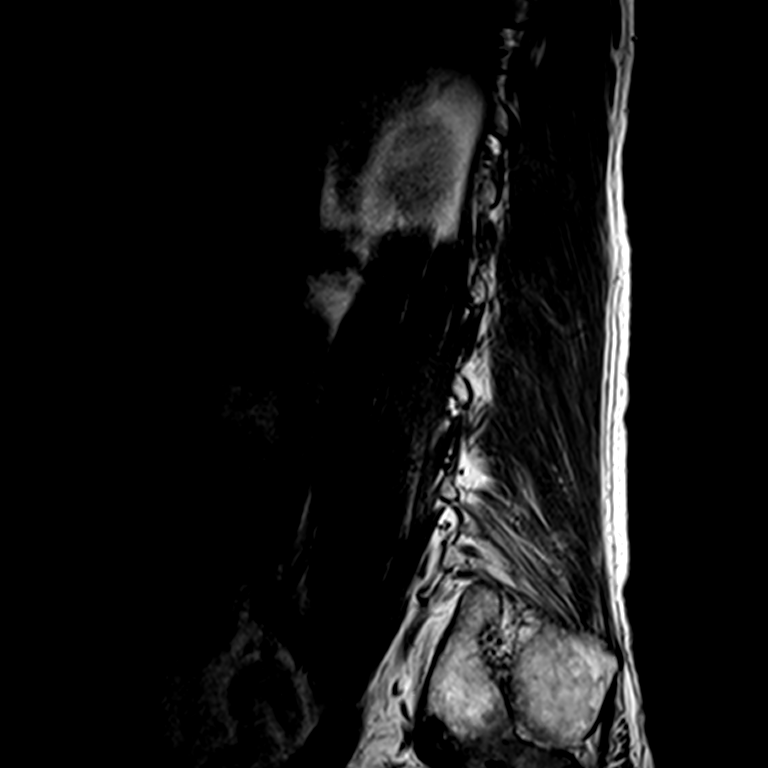

[Series 6: T2 · axial · 4.0mm · 0.23mm/px · z∈[-14,+113]mm · 3 of 39 slices shown (2 of 3)]
[im 7/39]
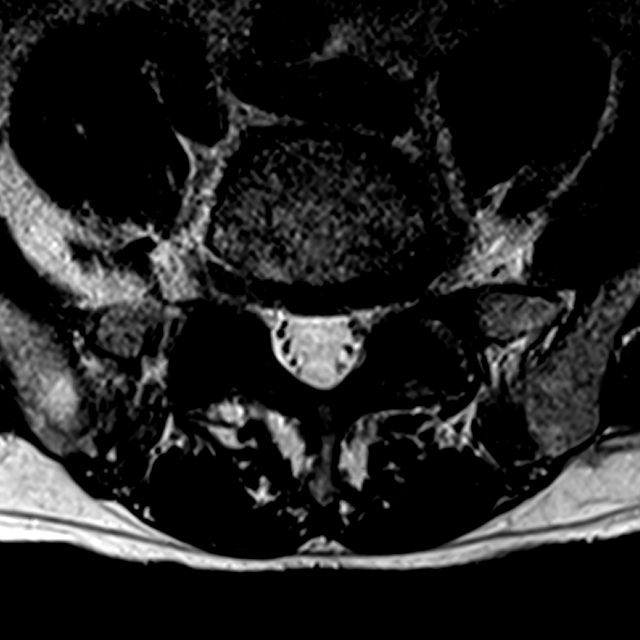
[im 20/39]
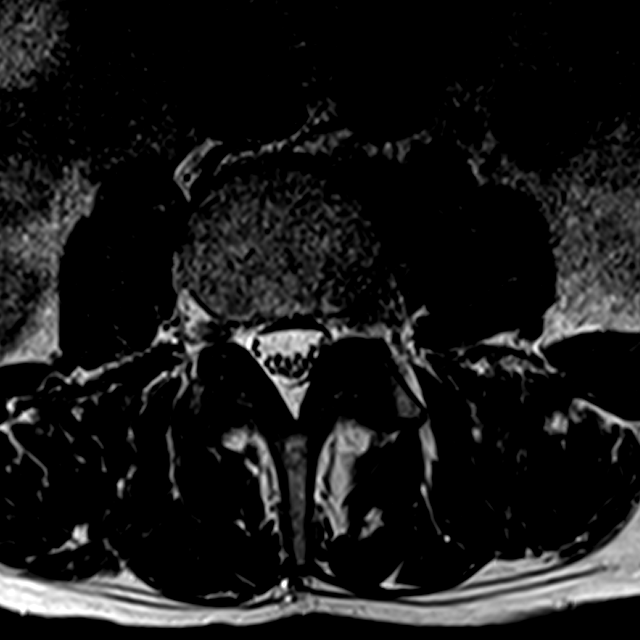
[im 32/39]
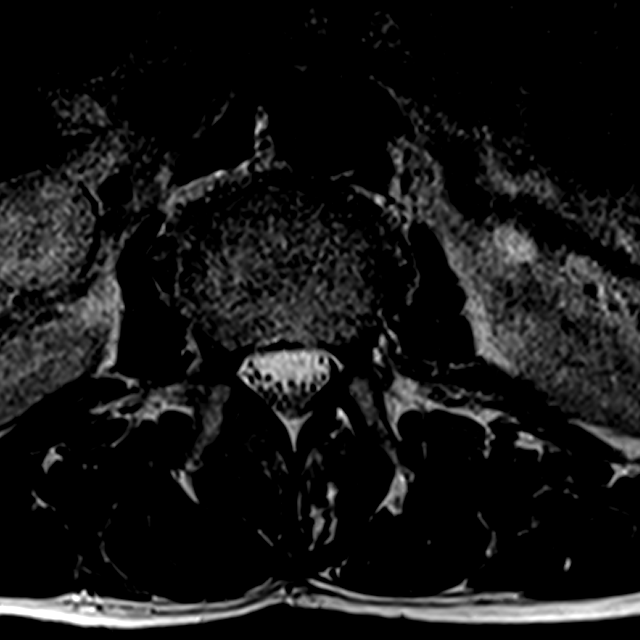

[Series 8: T2 · sagittal · 4.0mm · 0.44mm/px · 3 of 13 slices shown (3 of 3)]
[im 1/13]
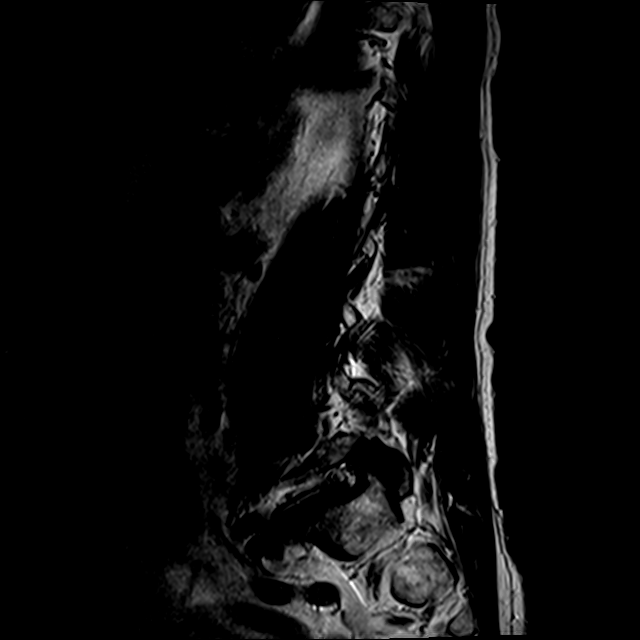
[im 9/13]
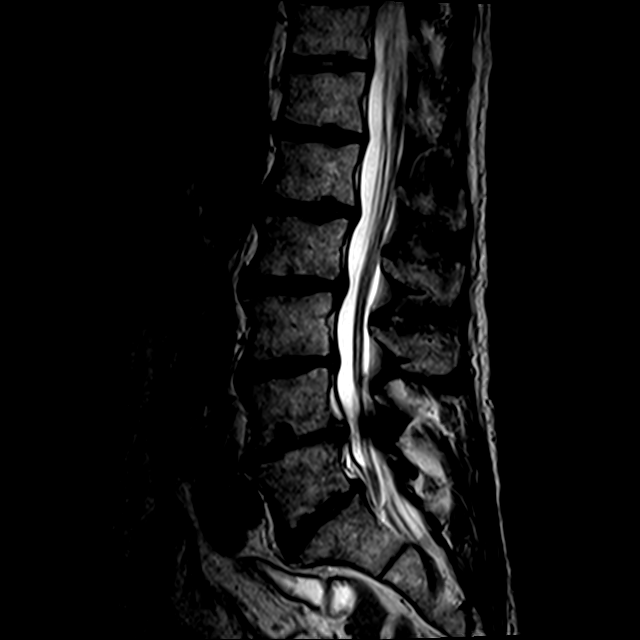
[im 13/13]
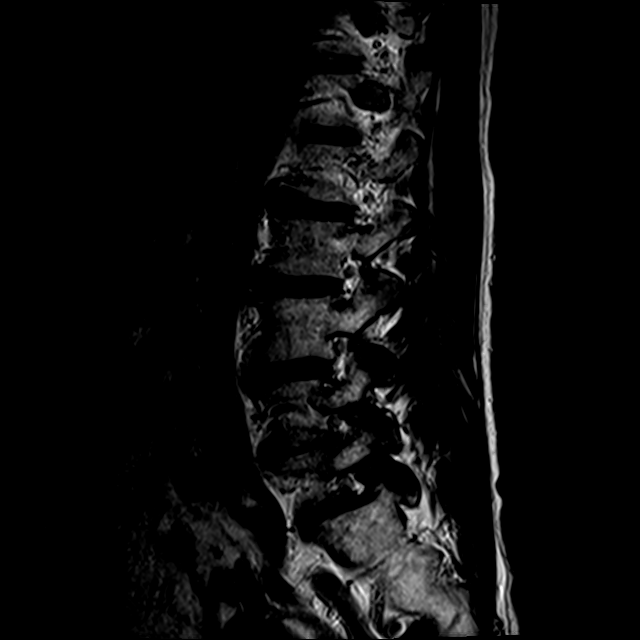

[15 of 48 positions shown; findings below may reference images not displayed]

FINDINGS: The T12-L1 and L1-2 discs bulge mildly. There is no herniation or
stenosis. The distal cord and conus are normal with the conus tip at
L1.

L2-3: moderate circumferential bulging of the disc. No compressive
stenosis. Inferior endplate Schmorl's node at L2 with minimal edema,
which could be associated with back pain.

L3-4:  Moderate bulging of the disc.  No compressive stenosis.

L4-5: Chronic disc degeneration with endplate osteophytes and
bulging disc material. Bilateral facet and ligamentous hypertrophy.
Mild stenosis of both lateral recesses and neural foramina. The
foraminal stenosis is worse on the right. Definite neural
compression is not established, but there would be some potential to
affect the exiting L4 nerve roots, particularly on the right.
Endplate Schmorl's nodes could contribute to back pain.

L5-S1: Disc degeneration with endplate osteophytes and shallow
protrusion of disc material. Mild facet and ligamentous hypertrophy.
Mild narrowing of the lateral recesses and foramina. Definite nerve
root compression is not established, but there is some potential to
affect the exiting L5 nerve roots. Mild discogenic edema of the
endplates could be associated with back pain.
IMPRESSION: Lower lumbar degenerative disc disease and degenerative facet
disease. At L4-5, there is lateral recess and foraminal stenosis
right more than left with some potential to affect the exiting L4
nerve roots, particularly on the right. At L5-S1, both neural
foramina are narrowed, with potential to affect the exiting L5 nerve
roots.

Some discogenic changes as well as the degenerated discs themselves
and facet arthropathy could be associated with back pain

## 2016-10-13 ENCOUNTER — Telehealth: Payer: Self-pay | Admitting: Orthopaedic Surgery

## 2016-10-13 NOTE — Telephone Encounter (Signed)
Hydrocodone-Acetaminophen  5/325mg   Qty 100 Tablest

## 2016-10-14 MED ORDER — HYDROCODONE-ACETAMINOPHEN 5-325 MG PO TABS: 1.0000 | ORAL_TABLET | Freq: Four times a day (QID) | ORAL | 0 refills | Status: DC | PRN

## 2016-11-04 ENCOUNTER — Other Ambulatory Visit: Payer: Self-pay | Admitting: Orthopaedic Surgery

## 2016-11-04 NOTE — Telephone Encounter (Signed)
Denied.  Muscle relaxants not recommended for long term.

## 2016-11-11 ENCOUNTER — Other Ambulatory Visit: Payer: Self-pay | Admitting: Family Medicine

## 2016-11-18 ENCOUNTER — Telehealth: Payer: Self-pay | Admitting: Orthopaedic Surgery

## 2016-11-18 MED ORDER — HYDROCODONE-ACETAMINOPHEN 5-325 MG PO TABS: 1.0000 | ORAL_TABLET | Freq: Four times a day (QID) | ORAL | 0 refills | Status: DC | PRN

## 2016-11-18 NOTE — Telephone Encounter (Signed)
Hydrocodone-Acetaminophen  5/325mg   Qty 100 Tablets   Pt has appointment tomorrow with you

## 2016-11-19 ENCOUNTER — Encounter: Payer: Self-pay | Admitting: Orthopaedic Surgery

## 2016-11-19 ENCOUNTER — Ambulatory Visit (INDEPENDENT_AMBULATORY_CARE_PROVIDER_SITE_OTHER): Payer: BLUE CROSS/BLUE SHIELD | Admitting: Orthopaedic Surgery

## 2016-11-19 VITALS — BP 129/80 | HR 75 | Temp 97.9°F | Ht 73.8 in | Wt 168.0 lb

## 2016-11-19 DIAGNOSIS — M25512 Pain in left shoulder: Secondary | ICD-10-CM

## 2016-11-19 DIAGNOSIS — F17210 Nicotine dependence, cigarettes, uncomplicated: Secondary | ICD-10-CM | POA: Diagnosis not present

## 2016-11-19 DIAGNOSIS — G8929 Other chronic pain: Secondary | ICD-10-CM

## 2016-11-19 NOTE — Patient Instructions (Addendum)
Steps to Quit Smoking Smoking tobacco can be bad for your health. It can also affect almost every organ in your body. Smoking puts you and people around you at risk for many serious Judithe Keetch-lasting (chronic) diseases. Quitting smoking is hard, but it is one of the best things that you can do for your health. It is never too late to quit. What are the benefits of quitting smoking? When you quit smoking, you lower your risk for getting serious diseases and conditions. They can include:  Lung cancer or lung disease.  Heart disease.  Stroke.  Heart attack.  Not being able to have children (infertility).  Weak bones (osteoporosis) and broken bones (fractures). If you have coughing, wheezing, and shortness of breath, those symptoms may get better when you quit. You may also get sick less often. If you are pregnant, quitting smoking can help to lower your chances of having a baby of low birth weight. What can I do to help me quit smoking? Talk with your doctor about what can help you quit smoking. Some things you can do (strategies) include:  Quitting smoking totally, instead of slowly cutting back how much you smoke over a period of time.  Going to in-person counseling. You are more likely to quit if you go to many counseling sessions.  Using resources and support systems, such as:  Online chats with a counselor.  Phone quitlines.  Printed self-help materials.  Support groups or group counseling.  Text messaging programs.  Mobile phone apps or applications.  Taking medicines. Some of these medicines may have nicotine in them. If you are pregnant or breastfeeding, do not take any medicines to quit smoking unless your doctor says it is okay. Talk with your doctor about counseling or other things that can help you. Talk with your doctor about using more than one strategy at the same time, such as taking medicines while you are also going to in-person counseling. This can help make quitting  easier. What things can I do to make it easier to quit? Quitting smoking might feel very hard at first, but there is a lot that you can do to make it easier. Take these steps:  Talk to your family and friends. Ask them to support and encourage you.  Call phone quitlines, reach out to support groups, or work with a counselor.  Ask people who smoke to not smoke around you.  Avoid places that make you want (trigger) to smoke, such as:  Bars.  Parties.  Smoke-break areas at work.  Spend time with people who do not smoke.  Lower the stress in your life. Stress can make you want to smoke. Try these things to help your stress:  Getting regular exercise.  Deep-breathing exercises.  Yoga.  Meditating.  Doing a body scan. To do this, close your eyes, focus on one area of your body at a time from head to toe, and notice which parts of your body are tense. Try to relax the muscles in those areas.  Download or buy apps on your mobile phone or tablet that can help you stick to your quit plan. There are many free apps, such as QuitGuide from the CDC (Centers for Disease Control and Prevention). You can find more support from smokefree.gov and other websites. This information is not intended to replace advice given to you by your health care provider. Make sure you discuss any questions you have with your health care provider. Document Released: 08/09/2009 Document Revised: 06/10/2016 Document   Reviewed: 02/27/2015 Elsevier Interactive Patient Education  2017 Elsevier Inc.  

## 2016-11-19 NOTE — Progress Notes (Signed)
PROCEDURE NOTE:  The patient request injection, verbal consent was obtained.  The left shoulder was prepped appropriately after time out was performed.   Sterile technique was observed and injection of 1 cc of Depo-Medrol 40 mg with several cc's of plain xylocaine. Anesthesia was provided by ethyl chloride and a 20-gauge needle was used to inject the shoulder area. A posterior approach was used.  The injection was tolerated well.  A band aid dressing was applied.  The patient was advised to apply ice later today and tomorrow to the injection sight as needed.  He has chronic pain of the left shoulder.  I have refilled his pain medicine after reviewing the state narcotic site.  Return in three months.  He continues to smoke but is trying to cut back.  Encounter Diagnoses  Name Primary?  . Chronic left shoulder pain Yes  . Cigarette nicotine dependence without complication    Call if any problem.  Electronically Signed Sanjuana Kava, MD 1/24/201810:31 AM

## 2016-12-23 ENCOUNTER — Telehealth: Payer: Self-pay | Admitting: Orthopaedic Surgery

## 2016-12-23 ENCOUNTER — Other Ambulatory Visit: Payer: Self-pay | Admitting: Orthopaedic Surgery

## 2016-12-23 MED ORDER — HYDROCODONE-ACETAMINOPHEN 5-325 MG PO TABS: 1.0000 | ORAL_TABLET | Freq: Four times a day (QID) | ORAL | 0 refills | Status: DC | PRN

## 2016-12-23 NOTE — Telephone Encounter (Signed)
Patient requests refill on Hydrocodone/Acetaminophen 5-325 mgs.Otho Darner  100  Sig: Take 1 tablet by mouth every 6 (six) hours as needed for moderate pain (Must last 30 days.Do not take and drive a car or use machinery.).   Patient is working with someone to fix his computer and also working with Pharmacist, community on his password

## 2017-01-14 ENCOUNTER — Telehealth: Payer: Self-pay | Admitting: Orthopaedic Surgery

## 2017-01-15 MED ORDER — HYDROCODONE-ACETAMINOPHEN 5-325 MG PO TABS: 1.0000 | ORAL_TABLET | Freq: Four times a day (QID) | ORAL | 0 refills | Status: DC | PRN

## 2017-02-18 ENCOUNTER — Ambulatory Visit (INDEPENDENT_AMBULATORY_CARE_PROVIDER_SITE_OTHER): Payer: BLUE CROSS/BLUE SHIELD | Admitting: Orthopaedic Surgery

## 2017-02-18 ENCOUNTER — Encounter: Payer: Self-pay | Admitting: Orthopaedic Surgery

## 2017-02-18 VITALS — BP 120/78 | HR 85 | Temp 98.1°F | Ht 73.8 in | Wt 168.0 lb

## 2017-02-18 DIAGNOSIS — M25512 Pain in left shoulder: Secondary | ICD-10-CM

## 2017-02-18 DIAGNOSIS — G8929 Other chronic pain: Secondary | ICD-10-CM

## 2017-02-18 DIAGNOSIS — F17210 Nicotine dependence, cigarettes, uncomplicated: Secondary | ICD-10-CM | POA: Diagnosis not present

## 2017-02-18 MED ORDER — HYDROCODONE-ACETAMINOPHEN 5-325 MG PO TABS: 1.0000 | ORAL_TABLET | Freq: Four times a day (QID) | ORAL | 0 refills | Status: DC | PRN

## 2017-02-18 NOTE — Progress Notes (Signed)
Patient UX:NATF [name], male DOB:05-Feb-1951, 66 y.o. TDD:[id number]  Chief Complaint  Patient presents with  . Follow-up    left shoudler pain    HPI  KORVIN VALENTINE is a 66 y.o. male who has chronic pain of the left shoulder.  The injection last time did not help much.  He has no paresthesias, no new trauma. He has more pain with overhead activities.   HPI  Body mass index is 21.69 kg/m.  ROS  Review of Systems  Respiratory: Negative for cough and shortness of breath.   Cardiovascular: Negative for chest pain and leg swelling.  Endocrine: Negative for cold intolerance.  Musculoskeletal: Positive for arthralgias and myalgias.  Allergic/Immunologic: Positive for environmental allergies.    Past Medical History:  Diagnosis Date  . Asthma   . COPD (chronic obstructive pulmonary disease) ([place])   . Hypertension   . Nicotine addiction     Past Surgical History:  Procedure Laterality Date  . KNEE ARTHROSCOPY     both knees     Family History  Problem Relation Age of Onset  . Heart attack Mother   . Diabetes Mother   . Hypertension Mother   . Stroke Brother   . Hypertension Sister   . Hypertension Sister   . Hypertension Sister   . Hypertension Brother     Social History Social History  Substance Use Topics  . Smoking status: Current Some Day Smoker    Packs/day: 0.75    Years: 48.00    Types: Cigarettes    Start date: 10/16/1969  . Smokeless tobacco: Never Used     Comment: 2-3 per day  . Alcohol use 0.0 oz/week     Comment: occassional , max of 72 oz pwr month    Allergies  Allergen Reactions  . Penicillins Hives    Current Outpatient Prescriptions  Medication Sig Dispense Refill  . albuterol (VENTOLIN HFA) 108 (90 BASE) MCG/ACT inhaler Inhale into the lungs every 6 (six) hours as needed for wheezing or shortness of breath.    Marland Kitchen amLODipine (NORVASC) 10 MG tablet Take 1 tablet (10 mg total) by mouth daily. 90 tablet 1  . aspirin EC 81 MG tablet Take  81 mg by mouth daily.    . budesonide-formoterol (SYMBICORT) 160-4.5 MCG/ACT inhaler INHALE 2 PUFFS INTO THE LUNGS 2 (TWO) TIMES DAILY. 10.2 Inhaler 5  . cyclobenzaprine (FLEXERIL) 10 MG tablet Take 1 tablet (10 mg total) by mouth at bedtime. 30 tablet 1  . gabapentin (NEURONTIN) 300 MG capsule TAKE 1 CAPSULE (300 MG TOTAL) BY MOUTH AT BEDTIME. 30 capsule 2  . HYDROcodone-acetaminophen (NORCO/VICODIN) 5-325 MG tablet Take 1 tablet by mouth every 6 (six) hours as needed for moderate pain (Must last 30 days.Do not take and drive a car or use machinery.). 100 tablet 0  . ipratropium-albuterol (DUONEB) 0.5-2.5 (3) MG/3ML SOLN One inhalation every 8 hours as needed, for severe wheezing 360 mL 3  . pravastatin (PRAVACHOL) 40 MG tablet TAKE 1 TABLET (40 MG TOTAL) BY MOUTH EVERY EVENING. 90 tablet 1  . spironolactone (ALDACTONE) 50 MG tablet Take 1 tablet (50 mg total) by mouth daily. 90 tablet 1   No current facility-administered medications for this visit.      Physical Exam  Blood pressure 120/78, pulse 85, temperature 98.1 F (36.7 C), height 6' 1.8" (1.875 m), weight 168 lb (76.2 kg).  Constitutional: overall normal hygiene, normal nutrition, well developed, normal grooming, normal body habitus. Assistive device:none  Musculoskeletal: gait and  station Limp none, muscle tone and strength are normal, no tremors or atrophy is present.  .  Neurological: coordination overall normal.  Deep tendon reflex/nerve stretch intact.  Sensation normal.  Cranial nerves II-XII intact.   Skin:   Normal overall no scars, lesions, ulcers or rashes. No psoriasis.  Psychiatric: Alert and oriented x 3.  Recent memory intact, remote memory unclear.  Normal mood and affect. Well groomed.  Good eye contact.  Cardiovascular: overall no swelling, no varicosities, no edema bilaterally, normal temperatures of the legs and arms, no clubbing, cyanosis and good capillary refill.  Lymphatic: palpation is  normal.  Examination of left Upper Extremity is done.  Inspection:   Overall:  Elbow non-tender without crepitus or defects, forearm non-tender without crepitus or defects, wrist non-tender without crepitus or defects, hand non-tender.    Shoulder: with glenohumeral joint tenderness, without effusion.   Upper arm: without swelling and tenderness   Range of motion:   Overall:  Full range of motion of the elbow, full range of motion of wrist and full range of motion in fingers.   Shoulder:  left  165 degrees forward flexion; 145 degrees abduction; 35 degrees internal rotation, 35 degrees external rotation, 15 degrees extension, 40 degrees adduction.   Stability:   Overall:  Shoulder, elbow and wrist stable   Strength and Tone:   Overall full shoulder muscles strength, full upper arm strength and normal upper arm bulk and tone.   The patient has been educated about the nature of the problem(s) and counseled on treatment options.  The patient appeared to understand what I have discussed and is in agreement with it.  Encounter Diagnoses  Name Primary?  . Chronic left shoulder pain Yes  . Cigarette nicotine dependence without complication     PLAN Call if any problems.  Precautions discussed.  Continue current medications.   Return to clinic 3 months   I have reviewed the [place] web site prior to prescribing narcotic medicine for this patient.  Electronically Payette, MD 4/25/201810:02 AM

## 2017-02-18 NOTE — Patient Instructions (Signed)
Steps to Quit Smoking Smoking tobacco can be bad for your health. It can also affect almost every organ in your body. Smoking puts you and people around you at risk for many serious long-lasting (chronic) diseases. Quitting smoking is hard, but it is one of the best things that you can do for your health. It is never too late to quit. What are the benefits of quitting smoking? When you quit smoking, you lower your risk for getting serious diseases and conditions. They can include:  Lung cancer or lung disease.  Heart disease.  Stroke.  Heart attack.  Not being able to have children (infertility).  Weak bones (osteoporosis) and broken bones (fractures). If you have coughing, wheezing, and shortness of breath, those symptoms may get better when you quit. You may also get sick less often. If you are pregnant, quitting smoking can help to lower your chances of having a baby of low birth weight. What can I do to help me quit smoking? Talk with your doctor about what can help you quit smoking. Some things you can do (strategies) include:  Quitting smoking totally, instead of slowly cutting back how much you smoke over a period of time.  Going to in-person counseling. You are more likely to quit if you go to many counseling sessions.  Using resources and support systems, such as:  Online chats with a counselor.  Phone quitlines.  Printed self-help materials.  Support groups or group counseling.  Text messaging programs.  Mobile phone apps or applications.  Taking medicines. Some of these medicines may have nicotine in them. If you are pregnant or breastfeeding, do not take any medicines to quit smoking unless your doctor says it is okay. Talk with your doctor about counseling or other things that can help you. Talk with your doctor about using more than one strategy at the same time, such as taking medicines while you are also going to in-person counseling. This can help make quitting  easier. What things can I do to make it easier to quit? Quitting smoking might feel very hard at first, but there is a lot that you can do to make it easier. Take these steps:  Talk to your family and friends. Ask them to support and encourage you.  Call phone quitlines, reach out to support groups, or work with a counselor.  Ask people who smoke to not smoke around you.  Avoid places that make you want (trigger) to smoke, such as:  Bars.  Parties.  Smoke-break areas at work.  Spend time with people who do not smoke.  Lower the stress in your life. Stress can make you want to smoke. Try these things to help your stress:  Getting regular exercise.  Deep-breathing exercises.  Yoga.  Meditating.  Doing a body scan. To do this, close your eyes, focus on one area of your body at a time from head to toe, and notice which parts of your body are tense. Try to relax the muscles in those areas.  Download or buy apps on your mobile phone or tablet that can help you stick to your quit plan. There are many free apps, such as QuitGuide from the CDC (Centers for Disease Control and Prevention). You can find more support from smokefree.gov and other websites. This information is not intended to replace advice given to you by your health care provider. Make sure you discuss any questions you have with your health care provider. Document Released: 08/09/2009 Document Revised: 06/10/2016 Document   Reviewed: 02/27/2015 Elsevier Interactive Patient Education  2017 Elsevier Inc.  

## 2017-02-27 ENCOUNTER — Other Ambulatory Visit: Payer: Self-pay | Admitting: Family Medicine

## 2017-02-27 DIAGNOSIS — J441 Chronic obstructive pulmonary disease with (acute) exacerbation: Secondary | ICD-10-CM

## 2017-03-17 ENCOUNTER — Telehealth: Payer: Self-pay

## 2017-03-17 DIAGNOSIS — I10 Essential (primary) hypertension: Secondary | ICD-10-CM

## 2017-03-17 DIAGNOSIS — E785 Hyperlipidemia, unspecified: Secondary | ICD-10-CM

## 2017-03-17 NOTE — Telephone Encounter (Signed)
Labs ordered.

## 2017-03-23 ENCOUNTER — Telehealth: Payer: Self-pay | Admitting: Orthopaedic Surgery

## 2017-03-24 MED ORDER — HYDROCODONE-ACETAMINOPHEN 5-325 MG PO TABS: 1.0000 | ORAL_TABLET | Freq: Four times a day (QID) | ORAL | 0 refills | Status: DC | PRN

## 2017-04-07 ENCOUNTER — Ambulatory Visit (INDEPENDENT_AMBULATORY_CARE_PROVIDER_SITE_OTHER): Payer: BLUE CROSS/BLUE SHIELD | Admitting: Urology

## 2017-04-07 DIAGNOSIS — R972 Elevated prostate specific antigen [PSA]: Secondary | ICD-10-CM

## 2017-04-07 DIAGNOSIS — N400 Enlarged prostate without lower urinary tract symptoms: Secondary | ICD-10-CM

## 2017-04-20 ENCOUNTER — Ambulatory Visit (HOSPITAL_COMMUNITY)
Admission: RE | Admit: 2017-04-20 | Discharge: 2017-04-20 | Disposition: A | Discharge status: Home / Self Care | Payer: BLUE CROSS/BLUE SHIELD | Source: Ambulatory Visit | Attending: Acute Care | Admitting: Acute Care

## 2017-04-20 DIAGNOSIS — I700 Atherosclerosis of aorta: Secondary | ICD-10-CM | POA: Insufficient documentation

## 2017-04-20 DIAGNOSIS — Z122 Encounter for screening for malignant neoplasm of respiratory organs: Secondary | ICD-10-CM | POA: Insufficient documentation

## 2017-04-20 DIAGNOSIS — I2510 Atherosclerotic heart disease of native coronary artery without angina pectoris: Secondary | ICD-10-CM | POA: Diagnosis not present

## 2017-04-20 DIAGNOSIS — F17210 Nicotine dependence, cigarettes, uncomplicated: Secondary | ICD-10-CM

## 2017-04-20 DIAGNOSIS — J432 Centrilobular emphysema: Secondary | ICD-10-CM | POA: Diagnosis not present

## 2017-04-22 ENCOUNTER — Other Ambulatory Visit: Payer: Self-pay | Admitting: Acute Care

## 2017-04-22 DIAGNOSIS — F17210 Nicotine dependence, cigarettes, uncomplicated: Secondary | ICD-10-CM

## 2017-04-23 ENCOUNTER — Telehealth: Payer: Self-pay | Admitting: Orthopaedic Surgery

## 2017-04-23 MED ORDER — HYDROCODONE-ACETAMINOPHEN 5-325 MG PO TABS: 1.0000 | ORAL_TABLET | Freq: Four times a day (QID) | ORAL | 0 refills | Status: DC | PRN

## 2017-05-20 ENCOUNTER — Ambulatory Visit (INDEPENDENT_AMBULATORY_CARE_PROVIDER_SITE_OTHER): Payer: BLUE CROSS/BLUE SHIELD | Admitting: Orthopaedic Surgery

## 2017-05-20 ENCOUNTER — Encounter: Payer: Self-pay | Admitting: Orthopaedic Surgery

## 2017-05-20 VITALS — BP 150/86 | HR 63 | Temp 97.2°F | Ht 73.8 in | Wt 163.0 lb

## 2017-05-20 DIAGNOSIS — G8929 Other chronic pain: Secondary | ICD-10-CM

## 2017-05-20 DIAGNOSIS — F17210 Nicotine dependence, cigarettes, uncomplicated: Secondary | ICD-10-CM

## 2017-05-20 DIAGNOSIS — M25512 Pain in left shoulder: Secondary | ICD-10-CM

## 2017-05-20 NOTE — Patient Instructions (Signed)
Steps to Quit Smoking Smoking tobacco can be bad for your health. It can also affect almost every organ in your body. Smoking puts you and people around you at risk for many serious long-lasting (chronic) diseases. Quitting smoking is hard, but it is one of the best things that you can do for your health. It is never too late to quit. What are the benefits of quitting smoking? When you quit smoking, you lower your risk for getting serious diseases and conditions. They can include:  Lung cancer or lung disease.  Heart disease.  Stroke.  Heart attack.  Not being able to have children (infertility).  Weak bones (osteoporosis) and broken bones (fractures).  If you have coughing, wheezing, and shortness of breath, those symptoms may get better when you quit. You may also get sick less often. If you are pregnant, quitting smoking can help to lower your chances of having a baby of low birth weight. What can I do to help me quit smoking? Talk with your doctor about what can help you quit smoking. Some things you can do (strategies) include:  Quitting smoking totally, instead of slowly cutting back how much you smoke over a period of time.  Going to in-person counseling. You are more likely to quit if you go to many counseling sessions.  Using resources and support systems, such as: ? Online chats with a counselor. ? Phone quitlines. ? Printed self-help materials. ? Support groups or group counseling. ? Text messaging programs. ? Mobile phone apps or applications.  Taking medicines. Some of these medicines may have nicotine in them. If you are pregnant or breastfeeding, do not take any medicines to quit smoking unless your doctor says it is okay. Talk with your doctor about counseling or other things that can help you.  Talk with your doctor about using more than one strategy at the same time, such as taking medicines while you are also going to in-person counseling. This can help make  quitting easier. What things can I do to make it easier to quit? Quitting smoking might feel very hard at first, but there is a lot that you can do to make it easier. Take these steps:  Talk to your family and friends. Ask them to support and encourage you.  Call phone quitlines, reach out to support groups, or work with a counselor.  Ask people who smoke to not smoke around you.  Avoid places that make you want (trigger) to smoke, such as: ? Bars. ? Parties. ? Smoke-break areas at work.  Spend time with people who do not smoke.  Lower the stress in your life. Stress can make you want to smoke. Try these things to help your stress: ? Getting regular exercise. ? Deep-breathing exercises. ? Yoga. ? Meditating. ? Doing a body scan. To do this, close your eyes, focus on one area of your body at a time from head to toe, and notice which parts of your body are tense. Try to relax the muscles in those areas.  Download or buy apps on your mobile phone or tablet that can help you stick to your quit plan. There are many free apps, such as QuitGuide from the CDC (Centers for Disease Control and Prevention). You can find more support from smokefree.gov and other websites.  This information is not intended to replace advice given to you by your health care provider. Make sure you discuss any questions you have with your health care provider. Document Released: 08/09/2009 Document   Revised: 06/10/2016 Document Reviewed: 02/27/2015 Elsevier Interactive Patient Education  2018 Elsevier Inc.  

## 2017-05-20 NOTE — Progress Notes (Signed)
[name], male DOB:1951/10/05, 66 y.o. LZJ:[id number]  Chief Complaint  Patient presents with  . Follow-up    left shoulder pain    HPI  Dillon [name] is a 66 y.o. male who has left shoulder pain.  He has no new trauma, no redness, no swelling and no paresthesias. He is doing his exercises and taking his medicine. HPI  Body mass index is 21.04 kg/m.  ROS  Review of Systems  Respiratory: Positive for shortness of breath. Negative for cough.   Cardiovascular: Negative for chest pain and leg swelling.  Endocrine: Negative for cold intolerance.  Musculoskeletal: Positive for arthralgias and myalgias.  Allergic/Immunologic: Positive for environmental allergies.    Past Medical History:  Diagnosis Date  . Asthma   . COPD (chronic obstructive pulmonary disease) ([place])   . Hypertension   . Nicotine addiction     Past Surgical History:  Procedure Laterality Date  . KNEE ARTHROSCOPY     both knees     Family History  Problem Relation Age of Onset  . Heart attack Mother   . Diabetes Mother   . Hypertension Mother   . Stroke Brother   . Hypertension Sister   . Hypertension Sister   . Hypertension Sister   . Hypertension Brother     Social History Social History  Substance Use Topics  . Smoking status: Current Some Day Smoker    Packs/day: 0.75    Years: 48.00    Types: Cigarettes    Start date: 10/16/1969  . Smokeless tobacco: Never Used     Comment: 2-3 per day  . Alcohol use 0.0 oz/week     Comment: occassional , max of 72 oz pwr month    Allergies  Allergen Reactions  . Penicillins Hives    Current Outpatient Prescriptions  Medication Sig Dispense Refill  . albuterol (VENTOLIN HFA) 108 (90 BASE) MCG/ACT inhaler Inhale into the lungs every 6 (six) hours as needed for wheezing or shortness of breath.    Marland Kitchen amLODipine (NORVASC) 10 MG tablet Take 1 tablet (10 mg total) by mouth daily. 90 tablet 1  . aspirin EC 81 MG tablet Take 81 mg by mouth  daily.    . cyclobenzaprine (FLEXERIL) 10 MG tablet Take 1 tablet (10 mg total) by mouth at bedtime. 30 tablet 1  . gabapentin (NEURONTIN) 300 MG capsule TAKE 1 CAPSULE (300 MG TOTAL) BY MOUTH AT BEDTIME. 30 capsule 2  . HYDROcodone-acetaminophen (NORCO/VICODIN) 5-325 MG tablet Take 1 tablet by mouth every 6 (six) hours as needed for moderate pain (Must last 30 days.Do not take and drive a car or use machinery.). 100 tablet 0  . ipratropium-albuterol (DUONEB) 0.5-2.5 (3) MG/3ML SOLN One inhalation every 8 hours as needed, for severe wheezing 360 mL 3  . pravastatin (PRAVACHOL) 40 MG tablet TAKE 1 TABLET (40 MG TOTAL) BY MOUTH EVERY EVENING. 90 tablet 1  . spironolactone (ALDACTONE) 50 MG tablet Take 1 tablet (50 mg total) by mouth daily. 90 tablet 1  . SYMBICORT 160-4.5 MCG/ACT inhaler INHALE 2 PUFFS INTO THE LUNGS 2 (TWO) TIMES DAILY. 10.2 Inhaler 5   No current facility-administered medications for this visit.      Physical Exam  Blood pressure (!) 150/86, pulse 63, temperature (!) 97.2 F (36.2 C), height 6' 1.8" (1.875 m), weight 163 lb (73.9 kg).  Constitutional: overall normal hygiene, normal nutrition, well developed, normal grooming, normal body habitus. Assistive device:none  Musculoskeletal: gait and station Limp none, muscle tone  and strength are normal, no tremors or atrophy is present.  .  Neurological: coordination overall normal.  Deep tendon reflex/nerve stretch intact.  Sensation normal.  Cranial nerves II-XII intact.   Skin:   Normal overall no scars, lesions, ulcers or rashes. No psoriasis.  Psychiatric: Alert and oriented x 3.  Recent memory intact, remote memory unclear.  Normal mood and affect. Well groomed.  Good eye contact.  Cardiovascular: overall no swelling, no varicosities, no edema bilaterally, normal temperatures of the legs and arms, no clubbing, cyanosis and good capillary refill.  Lymphatic: palpation is normal.  He has full ROM of the left  shoulder with pain in the extremes.  NV intact.  Grips normal.  Muscle tone and strength are normal.  The patient has been educated about the nature of the problem(s) and counseled on treatment options.  The patient appeared to understand what I have discussed and is in agreement with it.  Encounter Diagnoses  Name Primary?  . Chronic left shoulder pain Yes  . Cigarette nicotine dependence without complication     PLAN Call if any problems.  Precautions discussed.  Continue current medications.   Return to clinic 3 months   Electronically Signed Sanjuana Kava, MD 7/25/201810:37 AM

## 2017-05-22 ENCOUNTER — Other Ambulatory Visit: Payer: Self-pay | Admitting: Orthopaedic Surgery

## 2017-05-22 ENCOUNTER — Telehealth: Payer: Self-pay | Admitting: Orthopaedic Surgery

## 2017-05-26 MED ORDER — HYDROCODONE-ACETAMINOPHEN 5-325 MG PO TABS: 1.0000 | ORAL_TABLET | Freq: Four times a day (QID) | ORAL | 0 refills | Status: DC | PRN

## 2017-06-21 ENCOUNTER — Other Ambulatory Visit: Payer: Self-pay | Admitting: Family Medicine

## 2017-06-23 ENCOUNTER — Telehealth: Payer: Self-pay | Admitting: Orthopaedic Surgery

## 2017-06-23 MED ORDER — HYDROCODONE-ACETAMINOPHEN 5-325 MG PO TABS: 1.0000 | ORAL_TABLET | Freq: Four times a day (QID) | ORAL | 0 refills | Status: DC | PRN

## 2017-07-13 ENCOUNTER — Encounter: Payer: Self-pay | Admitting: Family Medicine

## 2017-07-13 ENCOUNTER — Telehealth: Payer: Self-pay | Admitting: Family Medicine

## 2017-07-13 DIAGNOSIS — I10 Essential (primary) hypertension: Secondary | ICD-10-CM

## 2017-07-13 NOTE — Telephone Encounter (Signed)
Pt due for an appt with dr Moshe Cipro.

## 2017-07-13 NOTE — Telephone Encounter (Signed)
Seen [phone]

## 2017-07-20 ENCOUNTER — Other Ambulatory Visit: Payer: Self-pay | Admitting: Orthopaedic Surgery

## 2017-07-27 MED ORDER — HYDROCODONE-ACETAMINOPHEN 5-325 MG PO TABS: 1.0000 | ORAL_TABLET | Freq: Four times a day (QID) | ORAL | 0 refills | Status: DC | PRN

## 2017-07-30 ENCOUNTER — Encounter: Payer: BLUE CROSS/BLUE SHIELD | Admitting: Family Medicine

## 2017-08-20 ENCOUNTER — Encounter: Payer: Self-pay | Admitting: Orthopaedic Surgery

## 2017-08-20 ENCOUNTER — Ambulatory Visit (INDEPENDENT_AMBULATORY_CARE_PROVIDER_SITE_OTHER): Payer: BLUE CROSS/BLUE SHIELD | Admitting: Orthopaedic Surgery

## 2017-08-20 VITALS — BP 131/78 | HR 82 | Ht 73.8 in | Wt 165.0 lb

## 2017-08-20 DIAGNOSIS — M25512 Pain in left shoulder: Secondary | ICD-10-CM | POA: Diagnosis not present

## 2017-08-20 DIAGNOSIS — G8929 Other chronic pain: Secondary | ICD-10-CM | POA: Diagnosis not present

## 2017-08-20 MED ORDER — CYCLOBENZAPRINE HCL 10 MG PO TABS: 10.0000 mg | ORAL_TABLET | Freq: Every day | ORAL | 1 refills | Status: DC

## 2017-08-20 MED ORDER — HYDROCODONE-ACETAMINOPHEN 5-325 MG PO TABS: 1.0000 | ORAL_TABLET | Freq: Four times a day (QID) | ORAL | 0 refills | Status: DC | PRN

## 2017-08-20 NOTE — Patient Instructions (Signed)

## 2017-08-20 NOTE — Progress Notes (Signed)
[name], male DOB:02/05/1951, 66 y.o. JAS:[id number]  Chief Complaint  Patient presents with  . Shoulder Pain    left    HPI  Dillon [name] is a 66 y.o. male who has chronic left shoulder pain. It is more tender the last week with the cooler weather.  He has no new trauma, no paresthesias.  He is taking his medicine. HPI  Body mass index is 21.3 kg/m.  ROS  Review of Systems  Respiratory: Positive for shortness of breath. Negative for cough.   Cardiovascular: Negative for chest pain and leg swelling.  Endocrine: Negative for cold intolerance.  Musculoskeletal: Positive for arthralgias and myalgias.  Allergic/Immunologic: Positive for environmental allergies.    Past Medical History:  Diagnosis Date  . Asthma   . COPD (chronic obstructive pulmonary disease) ([place])   . Hypertension   . Nicotine addiction     Past Surgical History:  Procedure Laterality Date  . KNEE ARTHROSCOPY     both knees     Family History  Problem Relation Age of Onset  . Heart attack Mother   . Diabetes Mother   . Hypertension Mother   . Stroke Brother   . Hypertension Sister   . Hypertension Sister   . Hypertension Sister   . Hypertension Brother     Social History Social History  Substance Use Topics  . Smoking status: Current Some Day Smoker    Packs/day: 0.75    Years: 48.00    Types: Cigarettes    Start date: 10/16/1969  . Smokeless tobacco: Never Used     Comment: 2-3 per day  . Alcohol use 0.0 oz/week     Comment: occassional , max of 72 oz pwr month    Allergies  Allergen Reactions  . Penicillins Hives    Current Outpatient Prescriptions  Medication Sig Dispense Refill  . albuterol (VENTOLIN HFA) 108 (90 BASE) MCG/ACT inhaler Inhale into the lungs every 6 (six) hours as needed for wheezing or shortness of breath.    Marland Kitchen amLODipine (NORVASC) 10 MG tablet TAKE 1 TABLET BY MOUTH EVERY DAY 90 tablet 1  . aspirin EC 81 MG tablet Take 81 mg by mouth daily.     . cyclobenzaprine (FLEXERIL) 10 MG tablet Take 1 tablet (10 mg total) by mouth at bedtime. 30 tablet 1  . gabapentin (NEURONTIN) 300 MG capsule TAKE 1 CAPSULE (300 MG TOTAL) BY MOUTH AT BEDTIME. 30 capsule 2  . HYDROcodone-acetaminophen (NORCO/VICODIN) 5-325 MG tablet Take 1 tablet by mouth every 6 (six) hours as needed for moderate pain (Must last 30 days.Do not take and drive a car or use machinery.). 100 tablet 0  . ipratropium-albuterol (DUONEB) 0.5-2.5 (3) MG/3ML SOLN One inhalation every 8 hours as needed, for severe wheezing 360 mL 3  . pravastatin (PRAVACHOL) 40 MG tablet TAKE 1 TABLET (40 MG TOTAL) BY MOUTH EVERY EVENING. 90 tablet 1  . spironolactone (ALDACTONE) 50 MG tablet TAKE 1 TABLET (50 MG TOTAL) BY MOUTH DAILY. 90 tablet 1  . SYMBICORT 160-4.5 MCG/ACT inhaler INHALE 2 PUFFS INTO THE LUNGS 2 (TWO) TIMES DAILY. 10.2 Inhaler 5   No current facility-administered medications for this visit.      Physical Exam  Blood pressure 131/78, pulse 82, height 6' 1.8" (1.875 m), weight 165 lb (74.8 kg).  Constitutional: overall normal hygiene, normal nutrition, well developed, normal grooming, normal body habitus. Assistive device:none  Musculoskeletal: gait and station Limp none, muscle tone and strength are normal, no tremors  or atrophy is present.  .  Neurological: coordination overall normal.  Deep tendon reflex/nerve stretch intact.  Sensation normal.  Cranial nerves II-XII intact.   Skin:   Normal overall no scars, lesions, ulcers or rashes. No psoriasis.  Psychiatric: Alert and oriented x 3.  Recent memory intact, remote memory unclear.  Normal mood and affect. Well groomed.  Good eye contact.  Cardiovascular: overall no swelling, no varicosities, no edema bilaterally, normal temperatures of the legs and arms, no clubbing, cyanosis and good capillary refill.  Lymphatic: palpation is normal.  All other systems reviewed and are negative   Examination of left Upper  Extremity is done.  Inspection:   Overall:  Elbow non-tender without crepitus or defects, forearm non-tender without crepitus or defects, wrist non-tender without crepitus or defects, hand non-tender.    Shoulder: with glenohumeral joint tenderness, without effusion.   Upper arm: without swelling and tenderness   Range of motion:   Overall:  Full range of motion of the elbow, full range of motion of wrist and full range of motion in fingers.   Shoulder:  left  180 degrees forward flexion; 165 degrees abduction; 35 degrees internal rotation, 35 degrees external rotation, 15 degrees extension, 40 degrees adduction.   Stability:   Overall:  Shoulder, elbow and wrist stable   Strength and Tone:   Overall full shoulder muscles strength, full upper arm strength and normal upper arm bulk and tone.  The patient has been educated about the nature of the problem(s) and counseled on treatment options.  The patient appeared to understand what I have discussed and is in agreement with it.  Encounter Diagnosis  Name Primary?  . Chronic left shoulder pain Yes    PLAN Call if any problems.  Precautions discussed.  Continue current medications.   Return to clinic 3 months   I have reviewed the [place] web site prior to prescribing narcotic medicine for this patient.  Electronically Signed Sanjuana Kava, MD 10/25/20189:16 AM

## 2017-09-23 ENCOUNTER — Other Ambulatory Visit: Payer: Self-pay | Admitting: Orthopaedic Surgery

## 2017-09-23 MED ORDER — HYDROCODONE-ACETAMINOPHEN 5-325 MG PO TABS: 1.0000 | ORAL_TABLET | Freq: Four times a day (QID) | ORAL | 0 refills | Status: DC | PRN

## 2017-10-15 ENCOUNTER — Encounter: Payer: Self-pay | Admitting: Family Medicine

## 2017-10-15 ENCOUNTER — Ambulatory Visit (INDEPENDENT_AMBULATORY_CARE_PROVIDER_SITE_OTHER): Payer: BLUE CROSS/BLUE SHIELD | Admitting: Family Medicine

## 2017-10-15 ENCOUNTER — Telehealth: Payer: Self-pay | Admitting: Orthopaedic Surgery

## 2017-10-15 VITALS — BP 120/80 | HR 73 | Resp 16 | Ht 74.0 in | Wt 163.1 lb

## 2017-10-15 DIAGNOSIS — Z1211 Encounter for screening for malignant neoplasm of colon: Secondary | ICD-10-CM | POA: Diagnosis not present

## 2017-10-15 DIAGNOSIS — Z0000 Encounter for general adult medical examination without abnormal findings: Secondary | ICD-10-CM

## 2017-10-15 DIAGNOSIS — E785 Hyperlipidemia, unspecified: Secondary | ICD-10-CM

## 2017-10-15 DIAGNOSIS — R972 Elevated prostate specific antigen [PSA]: Secondary | ICD-10-CM

## 2017-10-15 DIAGNOSIS — M1990 Unspecified osteoarthritis, unspecified site: Secondary | ICD-10-CM | POA: Diagnosis not present

## 2017-10-15 DIAGNOSIS — E559 Vitamin D deficiency, unspecified: Secondary | ICD-10-CM

## 2017-10-15 DIAGNOSIS — F17200 Nicotine dependence, unspecified, uncomplicated: Secondary | ICD-10-CM

## 2017-10-15 DIAGNOSIS — I2510 Atherosclerotic heart disease of native coronary artery without angina pectoris: Secondary | ICD-10-CM | POA: Diagnosis not present

## 2017-10-15 DIAGNOSIS — I10 Essential (primary) hypertension: Secondary | ICD-10-CM

## 2017-10-15 LAB — POC HEMOCCULT BLD/STL (OFFICE/1-CARD/DIAGNOSTIC): Fecal Occult Blood, POC: NEGATIVE

## 2017-10-15 MED ORDER — HYDROCODONE-ACETAMINOPHEN 5-325 MG PO TABS: 1.0000 | ORAL_TABLET | Freq: Four times a day (QID) | ORAL | 0 refills | Status: DC | PRN

## 2017-10-15 MED ORDER — MELOXICAM 15 MG PO TABS: ORAL_TABLET | ORAL | 2 refills | Status: DC

## 2017-10-15 NOTE — Progress Notes (Signed)
Dillon [name]     MRN: [id number]      DOB: February 12, 1951   HPI: Patient is in for annual physical exam. C/o arthritic pain in multiple joints and requests medication for this Has CAD, on chest scan and multiple risk factors. was referred to cardiology 3 years ago and did not completely follow though , states he is now ready to do this Labs are obtainedaon day of visit and reviewed after Immunization is reviewed , and  He refuses flu vaccine    PE; BP 138/80   Pulse 73   Resp 16   Ht 6\' 2"  (1.88 m)   Wt 163 lb 1.9 oz (74 kg)   SpO2 94%   BMI 20.94 kg/m   Pleasant male, alert and oriented x 3, in no cardio-pulmonary distress. Afebrile. HEENT No facial trauma or asymetry. Sinuses non tender. EOMI,. External ears normal, tympanic membranes clear. Oropharynx moist, no exudate. Neck: supple, no adenopathy,JVD or thyromegaly.No bruits.  Chest: Clear to ascultation bilaterally. Decreased air entry No crackles or wheezes. Non tender to palpation  Breast: No asymetry,no masses. No nipple discharge or inversion. No axillary or supraclavicular adenopathy  Cardiovascular system; Heart sounds normal,  S1 and  S2 ,no S3.  No murmur, or thrill. Apical beat not displaced Peripheral pulses normal.  Abdomen: Soft, non tender, no organomegaly or masses. No bruits. Bowel sounds normal. No guarding, tenderness or rebound.  Rectal:  Normal sphincter tone. No hemorrhoids or  masses. guaiac negative stool. Prostate smooth and firm    Musculoskeletal exam: Decreased  ROM of spine, hips , shoulders and knees.  deformity ,swelling and  crepitus noted. No muscle wasting or atrophy.   Neurologic: Cranial nerves 2 to 12 intact. Power, tone ,sensation and reflexes normal throughout. No disturbance in gait. No tremor.  Skin: Intact, no ulceration, erythema , scaling or rash noted. Pigmentation normal throughout  Psych; Normal mood and affect. Judgement and concentration  normal   Assessment & Plan:  Annual physical exam Annual exam as documented. Counseling done  re healthy lifestyle involving commitment to 150 minutes exercise per week, heart healthy diet, and attaining healthy weight.The importance of adequate sleep also discussed. Regular seat belt use and home safety, is also discussed. Changes in health habits are decided on by the patient with goals and time frames  set for achieving them. Immunization and cancer screening needs are specifically addressed at this visit.   NICOTINE ADDICTION Patient is asked and  confirms current  Nicotine use.  Five to seven minutes of time is spent in counseling the patient of the need to quit smoking  Advice to quit is delivered clearly specifically in reducing the risk of developing heart disease, having a stroke, or of developing all types of cancer, especially lung and oral cancer. Improvement in breathing and exercise tolerance and quality of life is also discussed, as is the economic benefit.  Assessment of willingness to quit or to make an attempt to quit is made and documented  Assistance in quit attempt is made with several and varied options presented, based on patient's desire and need. These include  literature, local classes available, 1800 QUIT NOW number, OTC and prescription medication.  The GOAL to be NICOTINE FREE is re emphasized.  The patient has set a personal goal of  discontinuation and follow up is arranged   CAD (coronary atherosclerotic disease) Discussed with pt the fact that he remains high risk for CAD , has documented CAD on chest  scan since 2015 and did not complete cardiology ev;aluation, states  He is now ready to do so, will refer to Dr Harl Bowie, updated lipid profile today  Vitamin D deficiency Will treat with weekly supplement for 12 months  Generalized arthritis Start daily meloxicam

## 2017-10-15 NOTE — Telephone Encounter (Signed)
Patient request refill on Hydrocodone/Acetaminophen 5-325 mgs  Qty  100   Sig: Take 1 tablet by mouth every 6 (six) hours as needed for moderate pain (Must last 30 days.Do not take and drive a car or use machinery.).

## 2017-10-15 NOTE — Patient Instructions (Addendum)
F/u in 6 month, call if you need me sooner  Labs today cBC, lipid, cmp and eGFR, tSH and vit d  New for arthritis pain is meloxicam 15 mg one daily if needed.  Yoyu are being referred to dr Harl Bowie to check the circulation in your heart, you need to follow through  You may call Dr Luan Pulling for f/u, chest scan this year shows no change Quit date for smoking is Jan1     Steps to Quit Smoking Smoking tobacco can be bad for your health. It can also affect almost every organ in your body. Smoking puts you and people around you at risk for many serious long-lasting (chronic) diseases. Quitting smoking is hard, but it is one of the best things that you can do for your health. It is never too late to quit. What are the benefits of quitting smoking? When you quit smoking, you lower your risk for getting serious diseases and conditions. They can include:  Lung cancer or lung disease.  Heart disease.  Stroke.  Heart attack.  Not being able to have children (infertility).  Weak bones (osteoporosis) and broken bones (fractures).  If you have coughing, wheezing, and shortness of breath, those symptoms may get better when you quit. You may also get sick less often. If you are pregnant, quitting smoking can help to lower your chances of having a baby of low birth weight. What can I do to help me quit smoking? Talk with your doctor about what can help you quit smoking. Some things you can do (strategies) include:  Quitting smoking totally, instead of slowly cutting back how much you smoke over a period of time.  Going to in-person counseling. You are more likely to quit if you go to many counseling sessions.  Using resources and support systems, such as: ? Database administrator with a Social worker. ? Phone quitlines. ? Careers information officer. ? Support groups or group counseling. ? Text messaging programs. ? Mobile phone apps or applications.  Taking medicines. Some of these medicines may have  nicotine in them. If you are pregnant or breastfeeding, do not take any medicines to quit smoking unless your doctor says it is okay. Talk with your doctor about counseling or other things that can help you.  Talk with your doctor about using more than one strategy at the same time, such as taking medicines while you are also going to in-person counseling. This can help make quitting easier. What things can I do to make it easier to quit? Quitting smoking might feel very hard at first, but there is a lot that you can do to make it easier. Take these steps:  Talk to your family and friends. Ask them to support and encourage you.  Call phone quitlines, reach out to support groups, or work with a Social worker.  Ask people who smoke to not smoke around you.  Avoid places that make you want (trigger) to smoke, such as: ? Bars. ? Parties. ? Smoke-break areas at work.  Spend time with people who do not smoke.  Lower the stress in your life. Stress can make you want to smoke. Try these things to help your stress: ? Getting regular exercise. ? Deep-breathing exercises. ? Yoga. ? Meditating. ? Doing a body scan. To do this, close your eyes, focus on one area of your body at a time from head to toe, and notice which parts of your body are tense. Try to relax the muscles in those areas.  Download or buy apps on your mobile phone or tablet that can help you stick to your quit plan. There are many free apps, such as QuitGuide from the [employer] Office manager for Disease Control and Prevention). You can find more support from smokefree.gov and other websites.  This information is not intended to replace advice given to you by your health care provider. Make sure you discuss any questions you have with your health care provider. Document Released: 08/09/2009 Document Revised: 06/10/2016 Document Reviewed: 02/27/2015 Elsevier Interactive Patient Education  2018 [employer].

## 2017-10-16 ENCOUNTER — Other Ambulatory Visit: Payer: Self-pay | Admitting: Family Medicine

## 2017-10-16 ENCOUNTER — Encounter: Payer: Self-pay | Admitting: Family Medicine

## 2017-10-16 DIAGNOSIS — E559 Vitamin D deficiency, unspecified: Secondary | ICD-10-CM | POA: Insufficient documentation

## 2017-10-16 DIAGNOSIS — M1990 Unspecified osteoarthritis, unspecified site: Secondary | ICD-10-CM | POA: Insufficient documentation

## 2017-10-16 LAB — COMPLETE METABOLIC PANEL WITH GFR
AG Ratio: 1.6 (calc) (ref 1.0–2.5)
ALT: 10 U/L (ref 9–46)
AST: 19 U/L (ref 10–35)
Albumin: 4.2 g/dL (ref 3.6–5.1)
Alkaline phosphatase (APISO): 67 U/L (ref 40–115)
BUN: 16 mg/dL (ref 7–25)
CO2: 30 mmol/L (ref 20–32)
Calcium: 9.6 mg/dL (ref 8.6–10.3)
Chloride: 103 mmol/L (ref 98–110)
Creat: 0.91 mg/dL (ref 0.70–1.25)
GFR, Est African American: 103 mL/min/{1.73_m2} (ref >=60)
GFR, Est Non African American: 89 mL/min/{1.73_m2} (ref >=60)
Globulin: 2.7 g/dL (calc) (ref 1.9–3.7)
Glucose, Bld: 89 mg/dL (ref 65–99)
Potassium: 4.8 mmol/L (ref 3.5–5.3)
Sodium: 139 mmol/L (ref 135–146)
Total Bilirubin: 0.4 mg/dL (ref 0.2–1.2)
Total Protein: 6.9 g/dL (ref 6.1–8.1)

## 2017-10-16 LAB — LIPID PANEL
Cholesterol: 174 mg/dL (ref <200)
HDL: 59 mg/dL (ref >40)
LDL Cholesterol (Calc): 101 mg/dL (calc) — ABNORMAL HIGH
Non-HDL Cholesterol (Calc): 115 mg/dL (calc) (ref <130)
Total CHOL/HDL Ratio: 2.9 (calc) (ref <5.0)
Triglycerides: 46 mg/dL (ref <150)

## 2017-10-16 LAB — CBC
HCT: 39.1 % (ref 38.5–50.0)
Hemoglobin: 13.3 g/dL (ref 13.2–17.1)
MCH: 30.9 pg (ref 27.0–33.0)
MCHC: 34 g/dL (ref 32.0–36.0)
MCV: 90.9 fL (ref 80.0–100.0)
MPV: 9 fL (ref 7.5–12.5)
Platelets: 359 10*3/uL (ref 140–400)
RBC: 4.3 10*6/uL (ref 4.20–5.80)
RDW: 12.8 % (ref 11.0–15.0)
WBC: 5 10*3/uL (ref 3.8–10.8)

## 2017-10-16 LAB — VITAMIN D 25 HYDROXY (VIT D DEFICIENCY, FRACTURES): Vit D, 25-Hydroxy: 21 ng/mL — ABNORMAL LOW (ref 30–100)

## 2017-10-16 LAB — TSH: TSH: 1.99 mIU/L (ref 0.40–4.50)

## 2017-10-16 MED ORDER — ERGOCALCIFEROL 1.25 MG (50000 UT) PO CAPS: 50000.0000 [IU] | ORAL_CAPSULE | ORAL | 3 refills | Status: DC

## 2017-10-16 NOTE — Assessment & Plan Note (Signed)
Discussed with pt the fact that he remains high risk for CAD , has documented CAD on chest scan since 2015 and did not complete cardiology ev;aluation, states  He is now ready to do so, will refer to Dr Harl Bowie, updated lipid profile today

## 2017-10-16 NOTE — Assessment & Plan Note (Signed)
Patient is asked and  confirms current  Nicotine use.  Five to seven minutes of time is spent in counseling the patient of the need to quit smoking  Advice to quit is delivered clearly specifically in reducing the risk of developing heart disease, having a stroke, or of developing all types of cancer, especially lung and oral cancer. Improvement in breathing and exercise tolerance and quality of life is also discussed, as is the economic benefit.  Assessment of willingness to quit or to make an attempt to quit is made and documented  Assistance in quit attempt is made with several and varied options presented, based on patient's desire and need. These include  literature, local classes available, 1800 QUIT NOW number, OTC and prescription medication.  The GOAL to be NICOTINE FREE is re emphasized.  The patient has set a personal goal of  discontinuation and follow up is arranged

## 2017-10-16 NOTE — Assessment & Plan Note (Signed)

## 2017-10-16 NOTE — Assessment & Plan Note (Signed)
Will treat with weekly supplement for 12 months

## 2017-10-16 NOTE — Assessment & Plan Note (Signed)
Start daily meloxicam

## 2017-10-21 ENCOUNTER — Other Ambulatory Visit: Payer: Self-pay | Admitting: Orthopaedic Surgery

## 2017-11-19 ENCOUNTER — Encounter: Payer: Self-pay | Admitting: Orthopaedic Surgery

## 2017-11-19 ENCOUNTER — Ambulatory Visit: Payer: BLUE CROSS/BLUE SHIELD | Admitting: Orthopaedic Surgery

## 2017-11-19 VITALS — BP 133/83 | HR 74 | Ht 74.0 in | Wt 163.0 lb

## 2017-11-19 DIAGNOSIS — G8929 Other chronic pain: Secondary | ICD-10-CM | POA: Diagnosis not present

## 2017-11-19 DIAGNOSIS — F17210 Nicotine dependence, cigarettes, uncomplicated: Secondary | ICD-10-CM | POA: Diagnosis not present

## 2017-11-19 DIAGNOSIS — M25512 Pain in left shoulder: Secondary | ICD-10-CM | POA: Diagnosis not present

## 2017-11-19 MED ORDER — HYDROCODONE-ACETAMINOPHEN 5-325 MG PO TABS: ORAL_TABLET | ORAL | 0 refills | Status: DC

## 2017-11-19 MED ORDER — HYDROCODONE-ACETAMINOPHEN 5-325 MG PO TABS: 1.0000 | ORAL_TABLET | Freq: Four times a day (QID) | ORAL | 0 refills | Status: DC | PRN

## 2017-11-19 NOTE — Patient Instructions (Signed)
Steps to Quit Smoking Smoking tobacco can be bad for your health. It can also affect almost every organ in your body. Smoking puts you and people around you at risk for many serious [place]-lasting (chronic) diseases. Quitting smoking is hard, but it is one of the best things that you can do for your health. It is never too late to quit. What are the benefits of quitting smoking? When you quit smoking, you lower your risk for getting serious diseases and conditions. They can include:  Lung cancer or lung disease.  Heart disease.  Stroke.  Heart attack.  Not being able to have children (infertility).  Weak bones (osteoporosis) and broken bones (fractures).  If you have coughing, wheezing, and shortness of breath, those symptoms may get better when you quit. You may also get sick less often. If you are pregnant, quitting smoking can help to lower your chances of having a baby of low birth weight. What can I do to help me quit smoking? Talk with your doctor about what can help you quit smoking. Some things you can do (strategies) include:  Quitting smoking totally, instead of slowly cutting back how much you smoke over a period of time.  Going to in-person counseling. You are more likely to quit if you go to many counseling sessions.  Using resources and support systems, such as: ? Online chats with a counselor. ? Phone quitlines. ? Printed self-help materials. ? Support groups or group counseling. ? Text messaging programs. ? Mobile phone apps or applications.  Taking medicines. Some of these medicines may have nicotine in them. If you are pregnant or breastfeeding, do not take any medicines to quit smoking unless your doctor says it is okay. Talk with your doctor about counseling or other things that can help you.  Talk with your doctor about using more than one strategy at the same time, such as taking medicines while you are also going to in-person counseling. This can help make  quitting easier. What things can I do to make it easier to quit? Quitting smoking might feel very hard at first, but there is a lot that you can do to make it easier. Take these steps:  Talk to your family and friends. Ask them to support and encourage you.  Call phone quitlines, reach out to support groups, or work with a counselor.  Ask people who smoke to not smoke around you.  Avoid places that make you want (trigger) to smoke, such as: ? Bars. ? Parties. ? Smoke-break areas at work.  Spend time with people who do not smoke.  Lower the stress in your life. Stress can make you want to smoke. Try these things to help your stress: ? Getting regular exercise. ? Deep-breathing exercises. ? Yoga. ? Meditating. ? Doing a body scan. To do this, close your eyes, focus on one area of your body at a time from head to toe, and notice which parts of your body are tense. Try to relax the muscles in those areas.  Download or buy apps on your mobile phone or tablet that can help you stick to your quit plan. There are many free apps, such as QuitGuide from the CDC (Centers for Disease Control and Prevention). You can find more support from smokefree.gov and other websites.  This information is not intended to replace advice given to you by your health care provider. Make sure you discuss any questions you have with your health care provider. Document Released: 08/09/2009 Document   Revised: 06/10/2016 Document Reviewed: 02/27/2015 Elsevier Interactive Patient Education  2018 Elsevier Inc.  

## 2017-11-19 NOTE — Progress Notes (Signed)
Patient ZD:GUYQ Dillon [name], male DOB:12/14/1950, 67 y.o. IHK:[id number]  Chief Complaint  Patient presents with  . Follow-up    Chronic left shoulder pain    HPI  Dillon [name] is a 67 y.o. male who has chronic pain of the left shoulder.  He has more pain with the cold weather.  He has no new trauma.  He has pain with overhead use.  He is taking his medicine and doing his exercises.  He has no numbness. HPI  Body mass index is 20.93 kg/m.  ROS  Review of Systems  Respiratory: Positive for shortness of breath. Negative for cough.   Cardiovascular: Negative for chest pain and leg swelling.  Endocrine: Negative for cold intolerance.  Musculoskeletal: Positive for arthralgias and myalgias.  Allergic/Immunologic: Positive for environmental allergies.  All other systems reviewed and are negative.   Past Medical History:  Diagnosis Date  . Asthma   . COPD (chronic obstructive pulmonary disease) ([place])   . Hypertension   . Nicotine addiction     Past Surgical History:  Procedure Laterality Date  . KNEE ARTHROSCOPY     both knees     Family History  Problem Relation Age of Onset  . Heart attack Mother   . Diabetes Mother   . Hypertension Mother   . Stroke Brother   . Hypertension Sister   . Hypertension Sister   . Hypertension Sister   . Hypertension Brother     Social History Social History   Tobacco Use  . Smoking status: Current Some Day Smoker    Packs/day: 0.75    Years: 48.00    Pack years: 36.00    Types: Cigarettes    Start date: 10/16/1969  . Smokeless tobacco: Never Used  . Tobacco comment: 2-3 per day  Substance Use Topics  . Alcohol use: Yes    Alcohol/week: 0.0 oz    Comment: occassional , max of 72 oz pwr month  . Drug use: No    Allergies  Allergen Reactions  . Penicillins Hives    Current Outpatient Medications  Medication Sig Dispense Refill  . albuterol (VENTOLIN HFA) 108 (90 BASE) MCG/ACT inhaler Inhale into the lungs every 6 (six)  hours as needed for wheezing or shortness of breath.    Marland Kitchen amLODipine (NORVASC) 10 MG tablet TAKE 1 TABLET BY MOUTH EVERY DAY 90 tablet 1  . aspirin EC 81 MG tablet Take 81 mg by mouth daily.    . cyclobenzaprine (FLEXERIL) 10 MG tablet TAKE 1 TABLET BY MOUTH EVERYDAY AT BEDTIME 30 tablet 1  . ergocalciferol (VITAMIN D2) 50000 units capsule Take 1 capsule (50,000 Units total) by mouth once a week. One capsule once weekly 12 capsule 3  . gabapentin (NEURONTIN) 300 MG capsule TAKE 1 CAPSULE (300 MG TOTAL) BY MOUTH AT BEDTIME. 30 capsule 2  . HYDROcodone-acetaminophen (NORCO/VICODIN) 5-325 MG tablet One tablet every six hours as needed for pain.  30 day limit. 100 tablet 0  . ipratropium-albuterol (DUONEB) 0.5-2.5 (3) MG/3ML SOLN One inhalation every 8 hours as needed, for severe wheezing 360 mL 3  . meloxicam (MOBIC) 15 MG tablet One tablet once daily, as needed, for uncontrolled arthritis pain 30 tablet 2  . pravastatin (PRAVACHOL) 40 MG tablet TAKE 1 TABLET (40 MG TOTAL) BY MOUTH EVERY EVENING. 90 tablet 1  . spironolactone (ALDACTONE) 50 MG tablet TAKE 1 TABLET (50 MG TOTAL) BY MOUTH DAILY. 90 tablet 1  . SYMBICORT 160-4.5 MCG/ACT inhaler INHALE 2 PUFFS INTO THE  LUNGS 2 (TWO) TIMES DAILY. 10.2 Inhaler 5   No current facility-administered medications for this visit.      Physical Exam  Blood pressure 133/83, pulse 74, height 6\' 2"  (1.88 m), weight 163 lb (73.9 kg).  Constitutional: overall normal hygiene, normal nutrition, well developed, normal grooming, normal body habitus. Assistive device:none  Musculoskeletal: gait and station Limp none, muscle tone and strength are normal, no tremors or atrophy is present.  .  Neurological: coordination overall normal.  Deep tendon reflex/nerve stretch intact.  Sensation normal.  Cranial nerves II-XII intact.   Skin:   Normal overall no scars, lesions, ulcers or rashes. No psoriasis.  Psychiatric: Alert and oriented x 3.  Recent memory intact,  remote memory unclear.  Normal mood and affect. Well groomed.  Good eye contact.  Cardiovascular: overall no swelling, no varicosities, no edema bilaterally, normal temperatures of the legs and arms, no clubbing, cyanosis and good capillary refill.  Lymphatic: palpation is normal.  Examination of left Upper Extremity is done.  Inspection:   Overall:  Elbow non-tender without crepitus or defects, forearm non-tender without crepitus or defects, wrist non-tender without crepitus or defects, hand non-tender.    Shoulder: with glenohumeral joint tenderness, without effusion.   Upper arm: without swelling and tenderness   Range of motion:   Overall:  Full range of motion of the elbow, full range of motion of wrist and full range of motion in fingers.   Shoulder:  left  145 degrees forward flexion; 120 degrees abduction; 30 degrees internal rotation, 30 degrees external rotation, 15 degrees extension, 40 degrees adduction.   Stability:   Overall:  Shoulder, elbow and wrist stable   Strength and Tone:   Overall full shoulder muscles strength, full upper arm strength and normal upper arm bulk and tone.  All other systems reviewed and are negative   The patient has been educated about the nature of the problem(s) and counseled on treatment options.  The patient appeared to understand what I have discussed and is in agreement with it.  Encounter Diagnoses  Name Primary?  . Chronic left shoulder pain Yes  . Cigarette nicotine dependence without complication     PLAN Call if any problems.  Precautions discussed.  Continue current medications.   Return to clinic 4 months   I have reviewed the [place] web site prior to prescribing narcotic medicine for this patient.  Electronically Signed Sanjuana Kava, MD 1/24/201910:02 AM

## 2017-11-24 ENCOUNTER — Ambulatory Visit (INDEPENDENT_AMBULATORY_CARE_PROVIDER_SITE_OTHER): Payer: BLUE CROSS/BLUE SHIELD | Admitting: Cardiology

## 2017-11-24 ENCOUNTER — Encounter: Payer: Self-pay | Admitting: Cardiology

## 2017-11-24 VITALS — BP 152/82 | HR 84 | Ht 74.0 in | Wt 164.0 lb

## 2017-11-24 DIAGNOSIS — I10 Essential (primary) hypertension: Secondary | ICD-10-CM | POA: Diagnosis not present

## 2017-11-24 DIAGNOSIS — M79604 Pain in right leg: Secondary | ICD-10-CM

## 2017-11-24 DIAGNOSIS — E782 Mixed hyperlipidemia: Secondary | ICD-10-CM | POA: Diagnosis not present

## 2017-11-24 DIAGNOSIS — R0989 Other specified symptoms and signs involving the circulatory and respiratory systems: Secondary | ICD-10-CM

## 2017-11-24 DIAGNOSIS — I2510 Atherosclerotic heart disease of native coronary artery without angina pectoris: Secondary | ICD-10-CM | POA: Diagnosis not present

## 2017-11-24 DIAGNOSIS — M79605 Pain in left leg: Secondary | ICD-10-CM

## 2017-11-24 NOTE — Patient Instructions (Signed)
Medication Instructions:  Your physician recommends that you continue on your current medications as directed. Please refer to the Current Medication list given to you today.   Labwork: NONE  Testing/Procedures: Your physician has requested that you have an ankle brachial index (ABI). During this test an ultrasound and blood pressure cuff are used to evaluate the arteries that supply the arms and legs with blood. Allow thirty minutes for this exam. There are no restrictions or special instructions.  Your physician has requested that you have a carotid duplex. This test is an ultrasound of the carotid arteries in your neck. It looks at blood flow through these arteries that supply the brain with blood. Allow one hour for this exam. There are no restrictions or special instructions.    Follow-Up: Your physician wants you to follow-up in: 1 YEAR.  You will receive a reminder letter in the mail two months in advance. If you don't receive a letter, please call our office to schedule the follow-up appointment.   Any Other Special Instructions Will Be Listed Below (If Applicable).     If you need a refill on your cardiac medications before your next appointment, please call your pharmacy.

## 2017-11-24 NOTE — Progress Notes (Signed)
Clinical Summary Dillon [name] is a 67 y.o.male I last saw in 06/2015, seen today for follow up of the following medical problems.   1.CAD - CT scan chest 01/2014 with incidental finding of coronary calcification - CAD risk factors: HTN, tobacco, FH with sister stents in her 65s, hyperlipidemia - 06/2015 nuclear stress no ischemia  - no recent chest pain. Occasional SOB at times, better with inhalers. - compliant with meds   2. HTN - compliant with meds   3. HL - he is compliant with statin 09/2017 TC 174 HDL 59 TG 46 LDL 101  4. Leg pain - mostly with walking in  bilateral calves, cramping. Better with resting - symptoms can be variable in distance.  Past Medical History:  Diagnosis Date  . Asthma   . COPD (chronic obstructive pulmonary disease) ([place])   . Hypertension   . Nicotine addiction      Allergies  Allergen Reactions  . Penicillins Hives     Current Outpatient Medications  Medication Sig Dispense Refill  . albuterol (VENTOLIN HFA) 108 (90 BASE) MCG/ACT inhaler Inhale into the lungs every 6 (six) hours as needed for wheezing or shortness of breath.    Marland Kitchen amLODipine (NORVASC) 10 MG tablet TAKE 1 TABLET BY MOUTH EVERY DAY 90 tablet 1  . aspirin EC 81 MG tablet Take 81 mg by mouth daily.    . cyclobenzaprine (FLEXERIL) 10 MG tablet TAKE 1 TABLET BY MOUTH EVERYDAY AT BEDTIME 30 tablet 1  . ergocalciferol (VITAMIN D2) 50000 units capsule Take 1 capsule (50,000 Units total) by mouth once a week. One capsule once weekly 12 capsule 3  . gabapentin (NEURONTIN) 300 MG capsule TAKE 1 CAPSULE (300 MG TOTAL) BY MOUTH AT BEDTIME. 30 capsule 2  . HYDROcodone-acetaminophen (NORCO/VICODIN) 5-325 MG tablet One tablet every six hours as needed for pain.  30 day limit. 100 tablet 0  . ipratropium-albuterol (DUONEB) 0.5-2.5 (3) MG/3ML SOLN One inhalation every 8 hours as needed, for severe wheezing 360 mL 3  . meloxicam (MOBIC) 15 MG tablet One tablet once daily, as needed,  for uncontrolled arthritis pain 30 tablet 2  . pravastatin (PRAVACHOL) 40 MG tablet TAKE 1 TABLET (40 MG TOTAL) BY MOUTH EVERY EVENING. 90 tablet 1  . spironolactone (ALDACTONE) 50 MG tablet TAKE 1 TABLET (50 MG TOTAL) BY MOUTH DAILY. 90 tablet 1  . SYMBICORT 160-4.5 MCG/ACT inhaler INHALE 2 PUFFS INTO THE LUNGS 2 (TWO) TIMES DAILY. 10.2 Inhaler 5   No current facility-administered medications for this visit.      Past Surgical History:  Procedure Laterality Date  . KNEE ARTHROSCOPY     both knees      Allergies  Allergen Reactions  . Penicillins Hives      Family History  Problem Relation Age of Onset  . Heart attack Mother   . Diabetes Mother   . Hypertension Mother   . Stroke Brother   . Hypertension Sister   . Hypertension Sister   . Hypertension Sister   . Hypertension Brother      Social History Dillon [name] reports that he has been smoking cigarettes.  He started smoking about 48 years ago. He has a 36.00 pack-year smoking history. he has never used smokeless tobacco. Dillon [name] reports that he drinks alcohol.   Review of Systems CONSTITUTIONAL: No weight loss, fever, chills, weakness or fatigue.  HEENT: Eyes: No visual loss, blurred vision, double vision or yellow sclerae.No hearing loss, sneezing, congestion, runny  nose or sore throat.  SKIN: No rash or itching.  CARDIOVASCULAR: per hpi RESPIRATORY: No shortness of breath, cough or sputum.  GASTROINTESTINAL: No anorexia, nausea, vomiting or diarrhea. No abdominal pain or blood.  GENITOURINARY: No burning on urination, no polyuria NEUROLOGICAL: No headache, dizziness, syncope, paralysis, ataxia, numbness or tingling in the extremities. No change in bowel or bladder control.  MUSCULOSKELETAL: per hpi LYMPHATICS: No enlarged nodes. No history of splenectomy.  PSYCHIATRIC: No history of depression or anxiety.  ENDOCRINOLOGIC: No reports of sweating, cold or heat intolerance. No polyuria or polydipsia.   Marland Kitchen   Physical Examination Vitals:   11/24/17 0904 11/24/17 0906  BP: (!) 152/84 (!) 152/82  Pulse: 84   SpO2: 96%    Vitals:   11/24/17 0904  Weight: 164 lb (74.4 kg)  Height: 6\' 2"  (1.88 m)    Gen: resting comfortably, no acute distress HEENT: no scleral icterus, pupils equal round and reactive, no palptable cervical adenopathy,  CV: RRR, no m/r/g, no jvd. Decreased pulses bilateral LEs. +right carotid bruit Resp: Clear to auscultation bilaterally GI: abdomen is soft, non-tender, non-distended, normal bowel sounds, no hepatosplenomegaly MSK: extremities are warm, no edema.  Skin: warm, no rash Neuro:  no focal deficits Psych: appropriate affect   Diagnostic Studies  06/2015 exercise nuclear stress  Exercise limited by possible right leg claudication.  There was no diagnostic ST segment deviation noted during Lexiscan infusion.  Blood pressure demonstrated a hypertensive response to exercise.  There is a small defect of mild severity present in the mid inferoseptal, apical septal and apical inferior location. The defect is non-reversible. Most consistent with soft tissue attenuation, no clear evidence of scar or ischemia.  This is a low risk study.  Nuclear stress EF: 59%.   Assessment and Plan   1. CAD - incidental findings of coronary atherosclerosis on recent chest CT - patient with multiple CAD risk factors - prior stress test without any functional ischemia. No recent symptoms - continue medical therapy and risk factor modification  2. HTN - elevated today but has not taken meds, review of multiple other recent clinic visits show he is at goal - continue current meds  3. Hyperlipidemia - at goal, continue current meds  4. Leg pains - symptoms suggestive of claudication along with decreased pulses on exam - we will order ABIs  5. Carotid bruit - order carotid US   F/u 1 year Arnoldo Lenis, M.D.

## 2017-11-27 ENCOUNTER — Ambulatory Visit (HOSPITAL_COMMUNITY)
Admission: RE | Admit: 2017-11-27 | Discharge: 2017-11-27 | Disposition: A | Discharge status: Home / Self Care | Payer: BLUE CROSS/BLUE SHIELD | Source: Ambulatory Visit | Attending: Cardiology | Admitting: Cardiology

## 2017-11-27 DIAGNOSIS — M79604 Pain in right leg: Secondary | ICD-10-CM

## 2017-11-27 DIAGNOSIS — R0989 Other specified symptoms and signs involving the circulatory and respiratory systems: Secondary | ICD-10-CM | POA: Diagnosis present

## 2017-11-27 DIAGNOSIS — I70201 Unspecified atherosclerosis of native arteries of extremities, right leg: Secondary | ICD-10-CM | POA: Diagnosis not present

## 2017-11-27 DIAGNOSIS — M79605 Pain in left leg: Principal | ICD-10-CM

## 2017-11-27 DIAGNOSIS — I6523 Occlusion and stenosis of bilateral carotid arteries: Secondary | ICD-10-CM | POA: Diagnosis not present

## 2017-11-30 ENCOUNTER — Telehealth: Payer: Self-pay

## 2017-11-30 DIAGNOSIS — I999 Unspecified disorder of circulatory system: Secondary | ICD-10-CM

## 2017-11-30 DIAGNOSIS — M79604 Pain in right leg: Secondary | ICD-10-CM

## 2017-11-30 NOTE — Telephone Encounter (Addendum)
-----   Message from Arnoldo Lenis, MD sent at 11/30/2017  1:07 PM EST ----- Circulation tests do show decreased circulation in the right leg. Have him clarify if he has symptoms in both legs, if so is the right worst than the left? Usually first line of treatment is medication, will start pending the response to the above questions  J BrancH MD   LMTCB-cc

## 2017-11-30 NOTE — Telephone Encounter (Deleted)
-----   Message from Dillon Lenis, MD sent at 11/30/2017  1:07 PM EST ----- Circulation tests do show decreased circulation in the right leg. Have him clarify if he has symptoms in both legs, if so is the right worst than the left? Usually first line of treatment is medication, will start pending the response to the above questions  J BrancH MD

## 2017-12-01 NOTE — Telephone Encounter (Signed)
I spoke with patient, pain is in right leg only.

## 2017-12-02 NOTE — Telephone Encounter (Signed)
Please start cilostazol 50mg  bid and refer to vascular in [place] Danielys Madry MD

## 2017-12-03 ENCOUNTER — Other Ambulatory Visit: Payer: Self-pay

## 2017-12-03 MED ORDER — CILOSTAZOL 50 MG PO TABS: 50.0000 mg | ORAL_TABLET | Freq: Two times a day (BID) | ORAL | 6 refills | Status: DC

## 2017-12-03 NOTE — Addendum Note (Signed)
Addended by: Barbarann Ehlers A on: 12/03/2017 09:09 AM   Modules accepted: Orders

## 2017-12-03 NOTE — Telephone Encounter (Signed)
Ref placed to [facility], Pletal  E-scribed, lmtcb-cc

## 2017-12-08 NOTE — Telephone Encounter (Signed)
Attempt to reach, no answer,no machine-cc 

## 2017-12-15 NOTE — Telephone Encounter (Signed)
Multiple attempts to reach pt, left message on voicemail for pt to call back

## 2017-12-21 ENCOUNTER — Other Ambulatory Visit: Payer: Self-pay | Admitting: Orthopaedic Surgery

## 2017-12-21 MED ORDER — HYDROCODONE-ACETAMINOPHEN 5-325 MG PO TABS: ORAL_TABLET | ORAL | 0 refills | Status: DC

## 2017-12-28 ENCOUNTER — Other Ambulatory Visit: Payer: Self-pay

## 2017-12-28 DIAGNOSIS — R6889 Other general symptoms and signs: Secondary | ICD-10-CM

## 2017-12-28 DIAGNOSIS — M79604 Pain in right leg: Secondary | ICD-10-CM

## 2018-01-07 NOTE — Telephone Encounter (Signed)
Sent letter to patient via [place] [facility] [place].I have not been able to reach I have not been able to reach him by phone.

## 2018-01-08 ENCOUNTER — Ambulatory Visit: Payer: BLUE CROSS/BLUE SHIELD | Admitting: Orthopedic Surgery

## 2018-01-14 ENCOUNTER — Telehealth: Payer: Self-pay | Admitting: Cardiovascular Disease

## 2018-01-14 NOTE — Telephone Encounter (Signed)
Patient notified of new medication and ref to [facility]

## 2018-01-14 NOTE — Telephone Encounter (Signed)
Patient calling in regards to letter he received from [facility] to discuss results. / tg

## 2018-01-17 ENCOUNTER — Other Ambulatory Visit: Payer: Self-pay | Admitting: Family Medicine

## 2018-01-17 DIAGNOSIS — I10 Essential (primary) hypertension: Secondary | ICD-10-CM

## 2018-01-20 ENCOUNTER — Ambulatory Visit (HOSPITAL_COMMUNITY)
Admission: RE | Admit: 2018-01-20 | Discharge: 2018-01-20 | Disposition: A | Discharge status: Home / Self Care | Payer: BLUE CROSS/BLUE SHIELD | Source: Ambulatory Visit | Attending: Vascular Surgery | Admitting: Vascular Surgery

## 2018-01-20 ENCOUNTER — Encounter: Payer: Self-pay | Admitting: *Deleted

## 2018-01-20 ENCOUNTER — Other Ambulatory Visit: Payer: Self-pay | Admitting: Family Medicine

## 2018-01-20 ENCOUNTER — Other Ambulatory Visit: Payer: Self-pay | Admitting: *Deleted

## 2018-01-20 ENCOUNTER — Ambulatory Visit (INDEPENDENT_AMBULATORY_CARE_PROVIDER_SITE_OTHER): Payer: BLUE CROSS/BLUE SHIELD | Admitting: Vascular Surgery

## 2018-01-20 ENCOUNTER — Encounter: Payer: Self-pay | Admitting: Vascular Surgery

## 2018-01-20 ENCOUNTER — Other Ambulatory Visit: Payer: Self-pay

## 2018-01-20 DIAGNOSIS — M79604 Pain in right leg: Secondary | ICD-10-CM | POA: Insufficient documentation

## 2018-01-20 DIAGNOSIS — I70203 Unspecified atherosclerosis of native arteries of extremities, bilateral legs: Secondary | ICD-10-CM | POA: Diagnosis not present

## 2018-01-20 DIAGNOSIS — F17200 Nicotine dependence, unspecified, uncomplicated: Secondary | ICD-10-CM | POA: Diagnosis not present

## 2018-01-20 DIAGNOSIS — R6889 Other general symptoms and signs: Secondary | ICD-10-CM

## 2018-01-20 DIAGNOSIS — I2510 Atherosclerotic heart disease of native coronary artery without angina pectoris: Secondary | ICD-10-CM | POA: Insufficient documentation

## 2018-01-20 DIAGNOSIS — I10 Essential (primary) hypertension: Secondary | ICD-10-CM | POA: Diagnosis not present

## 2018-01-20 DIAGNOSIS — E785 Hyperlipidemia, unspecified: Secondary | ICD-10-CM | POA: Diagnosis not present

## 2018-01-20 DIAGNOSIS — I70211 Atherosclerosis of native arteries of extremities with intermittent claudication, right leg: Secondary | ICD-10-CM

## 2018-01-20 DIAGNOSIS — I70219 Atherosclerosis of native arteries of extremities with intermittent claudication, unspecified extremity: Secondary | ICD-10-CM | POA: Insufficient documentation

## 2018-01-20 NOTE — Progress Notes (Signed)
Requested by:  Arnoldo Lenis, MD [address] [place], [place] [address]  Reason for consultation: bilateral leg claudication   History of Present Illness   Dillon [name] is a 67 y.o. (1951-05-13) male who presents with chief complaint: bilateral leg pain (R>L).  Onset of symptom occurred years ago.  Pain is described as cramping, severity 1-5/10, and associated with ambulation.  Patient has attempted to treat this pain with rest.  The patient has no rest pain symptoms also and no leg wounds/ulcers.  Atherosclerotic risk factors include: HTN and smoking.  Pt reportedly had abnormal ABI and was referred here for evaluation.  Past Medical History:  Diagnosis Date  . Asthma   . COPD (chronic obstructive pulmonary disease) ([place])   . Hypertension   . Nicotine addiction     Past Surgical History:  Procedure Laterality Date  . KNEE ARTHROSCOPY     both knees     Social History   Socioeconomic History  . Marital status: Single    Spouse name: Not on file  . Number of children: Not on file  . Years of education: Not on file  . Highest education level: Not on file  Occupational History  . Not on file  Social Needs  . Financial resource strain: Not on file  . Food insecurity:    Worry: Not on file    Inability: Not on file  . Transportation needs:    Medical: Not on file    Non-medical: Not on file  Tobacco Use  . Smoking status: Current Some Day Smoker    Packs/day: 0.75    Years: 48.00    Pack years: 36.00    Types: Cigarettes    Start date: 10/16/1969  . Smokeless tobacco: Never Used  . Tobacco comment: 2-3 per day  Substance and Sexual Activity  . Alcohol use: Yes    Alcohol/week: 0.0 oz    Comment: occassional , max of 72 oz pwr month  . Drug use: No  . Sexual activity: Yes    Partners: Female  Lifestyle  . Physical activity:    Days per week: Not on file    Minutes per session: Not on file  . Stress: Not on file  Relationships  . Social  connections:    Talks on phone: Not on file    Gets together: Not on file    Attends religious service: Not on file    Active member of club or organization: Not on file    Attends meetings of clubs or organizations: Not on file    Relationship status: Not on file  . Intimate partner violence:    Fear of current or ex partner: Not on file    Emotionally abused: Not on file    Physically abused: Not on file    Forced sexual activity: Not on file  Other Topics Concern  . Not on file  Social History Narrative  . Not on file    Family History  Problem Relation Age of Onset  . Heart attack Mother   . Diabetes Mother   . Hypertension Mother   . Stroke Brother   . Hypertension Sister   . Hypertension Sister   . Hypertension Sister   . Hypertension Brother     Current Outpatient Medications  Medication Sig Dispense Refill  . albuterol (VENTOLIN HFA) 108 (90 BASE) MCG/ACT inhaler Inhale into the lungs every 6 (six) hours as needed for wheezing or shortness of breath.    Marland Kitchen  amLODipine (NORVASC) 10 MG tablet TAKE 1 TABLET BY MOUTH EVERY DAY 90 tablet 1  . aspirin EC 81 MG tablet Take 81 mg by mouth daily.    . cilostazol (PLETAL) 50 MG tablet Take 1 tablet (50 mg total) by mouth 2 (two) times daily. 60 tablet 6  . cyclobenzaprine (FLEXERIL) 10 MG tablet TAKE 1 TABLET BY MOUTH EVERYDAY AT BEDTIME 30 tablet 1  . ergocalciferol (VITAMIN D2) 50000 units capsule Take 1 capsule (50,000 Units total) by mouth once a week. One capsule once weekly 12 capsule 3  . gabapentin (NEURONTIN) 300 MG capsule TAKE 1 CAPSULE (300 MG TOTAL) BY MOUTH AT BEDTIME. 30 capsule 2  . HYDROcodone-acetaminophen (NORCO/VICODIN) 5-325 MG tablet One tablet every six hours as needed for pain.  30 day limit. 100 tablet 0  . ipratropium-albuterol (DUONEB) 0.5-2.5 (3) MG/3ML SOLN One inhalation every 8 hours as needed, for severe wheezing 360 mL 3  . meloxicam (MOBIC) 15 MG tablet TAKE 1 TABLET ONCE DAILY AS NEEDED FOR  UNCONTROLLED ARTHRITIS PAIN 30 tablet 2  . pravastatin (PRAVACHOL) 40 MG tablet TAKE 1 TABLET BY MOUTH EVERY DAY IN THE EVENING 90 tablet 1  . spironolactone (ALDACTONE) 50 MG tablet TAKE 1 TABLET BY MOUTH EVERY DAY 90 tablet 1  . SYMBICORT 160-4.5 MCG/ACT inhaler INHALE 2 PUFFS INTO THE LUNGS 2 (TWO) TIMES DAILY. 10.2 Inhaler 5   No current facility-administered medications for this visit.     Allergies  Allergen Reactions  . Penicillins Hives    REVIEW OF SYSTEMS (negative unless checked):   Cardiac:  []  Chest pain or chest pressure? []  Shortness of breath upon activity? []  Shortness of breath when lying flat? []  Irregular heart rhythm?  Vascular:  [x]  Pain in calf, thigh, or hip brought on by walking? []  Pain in feet at night that wakes you up from your sleep? []  Blood clot in your veins? []  Leg swelling?  Pulmonary:  []  Oxygen at home? []  Productive cough? []  Wheezing?  Neurologic:  []  Sudden weakness in arms or legs? []  Sudden numbness in arms or legs? []  Sudden onset of difficult speaking or slurred speech? []  Temporary loss of vision in one eye? []  Problems with dizziness?  Gastrointestinal:  []  Blood in stool? []  Vomited blood?  Genitourinary:  []  Burning when urinating? []  Blood in urine?  Psychiatric:  []  Major depression  Hematologic:  []  Bleeding problems? []  Problems with blood clotting?  Dermatologic:  []  Rashes or ulcers?  Constitutional:  []  Fever or chills?  Ear/Nose/Throat:  []  Change in hearing? []  Nose bleeds? []  Sore throat?  Musculoskeletal:  []  Back pain? []  Joint pain? []  Muscle pain?   For VQI Use Only   PRE-ADM LIVING Home  AMB STATUS Ambulatory  CAD Sx None  PRIOR CHF None  STRESS TEST No   Physical Examination     Vitals:   01/20/18 1105 01/20/18 1111  BP: (!) 144/87 137/88  Pulse: 70 70  Resp: 18   Temp: 97.8 F (36.6 C)   TempSrc: Oral   SpO2: 100%   Weight: 161 lb (73 kg)   Height: 6' 2.5"  (1.892 m)    Body mass index is 20.39 kg/m.  General Alert, O x 3, WD, NAD  [place]/AT,    Ear/Nose/ Throat Hearing grossly intact, nares without erythema or drainage, oropharynx without Erythema or Exudate, Mallampati score: 3,   Eyes PERRLA, EOMI,    Neck Supple, mid-line trachea,    Pulmonary Sym  exp, good B air movt, CTA B  Cardiac RRR, Nl S1, S2, no Murmurs, No rubs, No S3,S4  Vascular Vessel Right Left  Radial Palpable Palpable  Brachial Palpable Palpable  Carotid Palpable, No Bruit Palpable, No Bruit  Aorta Not palpable N/A  Femoral Palpable Palpable  Popliteal Not palpable Not palpable  PT Not palpable Not palpable  DP Not palpable Not palpable    Gastro- intestinal soft, non-distended, non-tender to palpation, No guarding or rebound, no HSM, no masses, no CVAT B, No palpable prominent aortic pulse,    Musculo- skeletal M/S 5/5 throughout  , Extremities without ischemic changes  , No edema present, Varicosities present: B, also spider B, No Lipodermatosclerosis present  Neurologic Cranial nerves 2-12 intact , Pain and light touch intact in extremities , Motor exam as listed above  Psychiatric Judgement intact, Mood & affect appropriate for pt's clinical situation  Dermatologic See M/S exam for extremity exam, No rashes otherwise noted  Lymphatic  Palpable lymph nodes: None    Non-Invasive Vascular imaging   BLE arterial duplex (01/20/2018)  Right Duplex Findings:  PSV cm/s Ratio Stenosis Waveform Comments  CIA Distal 62      CFA Distal 62   triphasic    DFA 119   triphasic    SFA Prox 51   triphasic    SFA Mid 393  50-74% stenosis  biphasic    SFA Distal 58   biphasic    POP Prox 32   monophasic    ATA Distal 9   monophasic    PTA Distal 8   monophasic    PERO Distal 14   monophasic      Left Duplex Findings:  PSV cm/s Ratio Stenosis Waveform Comments  CFA Distal 114   triphasic    DFA 86   biphasic    SFA Prox 90   biphasic    SFA Mid 350  50-74%  stenosis  biphasic    SFA Distal 58   biphasic    POP Prox 82   biphasic    ATA Prox 101   triphasic    ATA Distal 48   monophasic    PTA Distal 43   biphasic    PERO Distal 32   biphasic      Final Interpretation: Right: 50-74% stenosis noted in the superficial femoral artery. Mild dilatation noted in the right popliteal artery, from 0.39 cm to 0.78 cm.  Minimal flow noted in the right distal superficial femoral and popliteal arteries, with collaterals noted in the distal segment of the popliteal artery.    Left: 50-74% stenosis noted in the superficial femoral artery.   *See table(s) above for measurements and observations.   Outside Studies/Documentation   2 pages of outside documents were reviewed including: outpatient PCP record.    Medical Decision Making   Dillon [name] is a 67 y.o. male who presents with: BLE intermittent claudication.   Patient does not have lifestyle or occupation limiting intermittent claudication.  However the arterial duplex demonstrates sub-total occlusion of the R SFA in process.  Given the improvement in atherectomy and drug coat angioplasty, some of these arteries can salvaged to previous exacerbation of this patient's intermittent claudication.  Based on this patient's history and physical exam, I offered: Aortogram, bilateral leg runoff and possible intervention on the right leg. I discussed with the patient the nature of angiographic procedures, especially the limited patencies of any endovascular intervention.   The patient is aware of that  the risks of an angiographic procedure include but are not limited to: bleeding, infection, access site complications, renal failure, embolization, rupture of vessel, dissection, arteriovenous fistula, possible need for emergent surgical intervention, possible need for surgical procedures to treat the patient's pathology, anaphylactic reaction to contrast, and stroke and death.   The patient is aware of  the risks and agrees to proceed.  He will be scheduled 11 APR 19.  I discussed with the patient the natural history of intermittent claudication: 75% of patients have stable or improved symptoms in a year an only 2% require amputation. Eventually 20% may require intervention in a year.  I discussed in depth with the patient the nature of atherosclerosis, and emphasized the importance of maximal medical management including strict control of blood pressure, blood glucose, and lipid levels, antiplatelet agent, obtaining regular exercise, and cessation of smoking.    The patient is aware that without maximal medical management the underlying atherosclerotic disease process will progress, limiting the benefit of any interventions.  I discussed in depth with the patient a walking plan and how to execute such.  The patient is taking Pletal. The patient is currently on a statin: Pravachol.  The patient is currently on an anti-platelet: ASA.  Thank you for allowing [place] to participate in this patient's care.   Adele Barthel, MD, FACS [facility] Office: [phone] Pager: [phone]  01/20/2018, 11:41 AM

## 2018-01-20 NOTE — H&P (View-Only) (Signed)
Requested by:  Arnoldo Lenis, MD [address] [place], [place] [address]  Reason for consultation: bilateral leg claudication   History of Present Illness   Dillon [name] is a 67 y.o. (1951/03/20) male who presents with chief complaint: bilateral leg pain (R>L).  Onset of symptom occurred years ago.  Pain is described as cramping, severity 1-5/10, and associated with ambulation.  Patient has attempted to treat this pain with rest.  The patient has no rest pain symptoms also and no leg wounds/ulcers.  Atherosclerotic risk factors include: HTN and smoking.  Pt reportedly had abnormal ABI and was referred here for evaluation.  Past Medical History:  Diagnosis Date  . Asthma   . COPD (chronic obstructive pulmonary disease) ([place])   . Hypertension   . Nicotine addiction     Past Surgical History:  Procedure Laterality Date  . KNEE ARTHROSCOPY     both knees     Social History   Socioeconomic History  . Marital status: Single    Spouse name: Not on file  . Number of children: Not on file  . Years of education: Not on file  . Highest education level: Not on file  Occupational History  . Not on file  Social Needs  . Financial resource strain: Not on file  . Food insecurity:    Worry: Not on file    Inability: Not on file  . Transportation needs:    Medical: Not on file    Non-medical: Not on file  Tobacco Use  . Smoking status: Current Some Day Smoker    Packs/day: 0.75    Years: 48.00    Pack years: 36.00    Types: Cigarettes    Start date: 10/16/1969  . Smokeless tobacco: Never Used  . Tobacco comment: 2-3 per day  Substance and Sexual Activity  . Alcohol use: Yes    Alcohol/week: 0.0 oz    Comment: occassional , max of 72 oz pwr month  . Drug use: No  . Sexual activity: Yes    Partners: Female  Lifestyle  . Physical activity:    Days per week: Not on file    Minutes per session: Not on file  . Stress: Not on file  Relationships  . Social  connections:    Talks on phone: Not on file    Gets together: Not on file    Attends religious service: Not on file    Active member of club or organization: Not on file    Attends meetings of clubs or organizations: Not on file    Relationship status: Not on file  . Intimate partner violence:    Fear of current or ex partner: Not on file    Emotionally abused: Not on file    Physically abused: Not on file    Forced sexual activity: Not on file  Other Topics Concern  . Not on file  Social History Narrative  . Not on file    Family History  Problem Relation Age of Onset  . Heart attack Mother   . Diabetes Mother   . Hypertension Mother   . Stroke Brother   . Hypertension Sister   . Hypertension Sister   . Hypertension Sister   . Hypertension Brother     Current Outpatient Medications  Medication Sig Dispense Refill  . albuterol (VENTOLIN HFA) 108 (90 BASE) MCG/ACT inhaler Inhale into the lungs every 6 (six) hours as needed for wheezing or shortness of breath.    Marland Kitchen  amLODipine (NORVASC) 10 MG tablet TAKE 1 TABLET BY MOUTH EVERY DAY 90 tablet 1  . aspirin EC 81 MG tablet Take 81 mg by mouth daily.    . cilostazol (PLETAL) 50 MG tablet Take 1 tablet (50 mg total) by mouth 2 (two) times daily. 60 tablet 6  . cyclobenzaprine (FLEXERIL) 10 MG tablet TAKE 1 TABLET BY MOUTH EVERYDAY AT BEDTIME 30 tablet 1  . ergocalciferol (VITAMIN D2) 50000 units capsule Take 1 capsule (50,000 Units total) by mouth once a week. One capsule once weekly 12 capsule 3  . gabapentin (NEURONTIN) 300 MG capsule TAKE 1 CAPSULE (300 MG TOTAL) BY MOUTH AT BEDTIME. 30 capsule 2  . HYDROcodone-acetaminophen (NORCO/VICODIN) 5-325 MG tablet One tablet every six hours as needed for pain.  30 day limit. 100 tablet 0  . ipratropium-albuterol (DUONEB) 0.5-2.5 (3) MG/3ML SOLN One inhalation every 8 hours as needed, for severe wheezing 360 mL 3  . meloxicam (MOBIC) 15 MG tablet TAKE 1 TABLET ONCE DAILY AS NEEDED FOR  UNCONTROLLED ARTHRITIS PAIN 30 tablet 2  . pravastatin (PRAVACHOL) 40 MG tablet TAKE 1 TABLET BY MOUTH EVERY DAY IN THE EVENING 90 tablet 1  . spironolactone (ALDACTONE) 50 MG tablet TAKE 1 TABLET BY MOUTH EVERY DAY 90 tablet 1  . SYMBICORT 160-4.5 MCG/ACT inhaler INHALE 2 PUFFS INTO THE LUNGS 2 (TWO) TIMES DAILY. 10.2 Inhaler 5   No current facility-administered medications for this visit.     Allergies  Allergen Reactions  . Penicillins Hives    REVIEW OF SYSTEMS (negative unless checked):   Cardiac:  []  Chest pain or chest pressure? []  Shortness of breath upon activity? []  Shortness of breath when lying flat? []  Irregular heart rhythm?  Vascular:  [x]  Pain in calf, thigh, or hip brought on by walking? []  Pain in feet at night that wakes you up from your sleep? []  Blood clot in your veins? []  Leg swelling?  Pulmonary:  []  Oxygen at home? []  Productive cough? []  Wheezing?  Neurologic:  []  Sudden weakness in arms or legs? []  Sudden numbness in arms or legs? []  Sudden onset of difficult speaking or slurred speech? []  Temporary loss of vision in one eye? []  Problems with dizziness?  Gastrointestinal:  []  Blood in stool? []  Vomited blood?  Genitourinary:  []  Burning when urinating? []  Blood in urine?  Psychiatric:  []  Major depression  Hematologic:  []  Bleeding problems? []  Problems with blood clotting?  Dermatologic:  []  Rashes or ulcers?  Constitutional:  []  Fever or chills?  Ear/Nose/Throat:  []  Change in hearing? []  Nose bleeds? []  Sore throat?  Musculoskeletal:  []  Back pain? []  Joint pain? []  Muscle pain?   For VQI Use Only   PRE-ADM LIVING Home  AMB STATUS Ambulatory  CAD Sx None  PRIOR CHF None  STRESS TEST No   Physical Examination     Vitals:   01/20/18 1105 01/20/18 1111  BP: (!) 144/87 137/88  Pulse: 70 70  Resp: 18   Temp: 97.8 F (36.6 C)   TempSrc: Oral   SpO2: 100%   Weight: 161 lb (73 kg)   Height: 6' 2.5"  (1.892 m)    Body mass index is 20.39 kg/m.  General Alert, O x 3, WD, NAD  Head [place]/AT,    Ear/Nose/ Throat Hearing grossly intact, nares without erythema or drainage, oropharynx without Erythema or Exudate, Mallampati score: 3,   Eyes PERRLA, EOMI,    Neck Supple, mid-line trachea,    Pulmonary Sym  exp, good B air movt, CTA B  Cardiac RRR, Nl S1, S2, no Murmurs, No rubs, No S3,S4  Vascular Vessel Right Left  Radial Palpable Palpable  Brachial Palpable Palpable  Carotid Palpable, No Bruit Palpable, No Bruit  Aorta Not palpable N/A  Femoral Palpable Palpable  Popliteal Not palpable Not palpable  PT Not palpable Not palpable  DP Not palpable Not palpable    Gastro- intestinal soft, non-distended, non-tender to palpation, No guarding or rebound, no HSM, no masses, no CVAT B, No palpable prominent aortic pulse,    Musculo- skeletal M/S 5/5 throughout  , Extremities without ischemic changes  , No edema present, Varicosities present: B, also spider B, No Lipodermatosclerosis present  Neurologic Cranial nerves 2-12 intact , Pain and light touch intact in extremities , Motor exam as listed above  Psychiatric Judgement intact, Mood & affect appropriate for pt's clinical situation  Dermatologic See M/S exam for extremity exam, No rashes otherwise noted  Lymphatic  Palpable lymph nodes: None    Non-Invasive Vascular imaging   BLE arterial duplex (01/20/2018)  Right Duplex Findings:  PSV cm/s Ratio Stenosis Waveform Comments  CIA Distal 62      CFA Distal 62   triphasic    DFA 119   triphasic    SFA Prox 51   triphasic    SFA Mid 393  50-74% stenosis  biphasic    SFA Distal 58   biphasic    POP Prox 32   monophasic    ATA Distal 9   monophasic    PTA Distal 8   monophasic    PERO Distal 14   monophasic      Left Duplex Findings:  PSV cm/s Ratio Stenosis Waveform Comments  CFA Distal 114   triphasic    DFA 86   biphasic    SFA Prox 90   biphasic    SFA Mid 350  50-74%  stenosis  biphasic    SFA Distal 58   biphasic    POP Prox 82   biphasic    ATA Prox 101   triphasic    ATA Distal 48   monophasic    PTA Distal 43   biphasic    PERO Distal 32   biphasic      Final Interpretation: Right: 50-74% stenosis noted in the superficial femoral artery. Mild dilatation noted in the right popliteal artery, from 0.39 cm to 0.78 cm.  Minimal flow noted in the right distal superficial femoral and popliteal arteries, with collaterals noted in the distal segment of the popliteal artery.    Left: 50-74% stenosis noted in the superficial femoral artery.   *See table(s) above for measurements and observations.   Outside Studies/Documentation   2 pages of outside documents were reviewed including: outpatient PCP record.    Medical Decision Making   CODEE TUTSON is a 67 y.o. male who presents with: BLE intermittent claudication.   Patient does not have lifestyle or occupation limiting intermittent claudication.  However the arterial duplex demonstrates sub-total occlusion of the R SFA in process.  Given the improvement in atherectomy and drug coat angioplasty, some of these arteries can salvaged to previous exacerbation of this patient's intermittent claudication.  Based on this patient's history and physical exam, I offered: Aortogram, bilateral leg runoff and possible intervention on the right leg. I discussed with the patient the nature of angiographic procedures, especially the limited patencies of any endovascular intervention.   The patient is aware of that  the risks of an angiographic procedure include but are not limited to: bleeding, infection, access site complications, renal failure, embolization, rupture of vessel, dissection, arteriovenous fistula, possible need for emergent surgical intervention, possible need for surgical procedures to treat the patient's pathology, anaphylactic reaction to contrast, and stroke and death.   The patient is aware of  the risks and agrees to proceed.  He will be scheduled 11 APR 19.  I discussed with the patient the natural history of intermittent claudication: 75% of patients have stable or improved symptoms in a year an only 2% require amputation. Eventually 20% may require intervention in a year.  I discussed in depth with the patient the nature of atherosclerosis, and emphasized the importance of maximal medical management including strict control of blood pressure, blood glucose, and lipid levels, antiplatelet agent, obtaining regular exercise, and cessation of smoking.    The patient is aware that without maximal medical management the underlying atherosclerotic disease process will progress, limiting the benefit of any interventions.  I discussed in depth with the patient a walking plan and how to execute such.  The patient is taking Pletal. The patient is currently on a statin: Pravachol.  The patient is currently on an anti-platelet: ASA.  Thank you for allowing [place] to participate in this patient's care.   Adele Barthel, MD, FACS [facility] [place] Office: [phone] Pager: [phone]  01/20/2018, 11:41 AM

## 2018-01-21 ENCOUNTER — Other Ambulatory Visit: Payer: Self-pay | Admitting: Orthopaedic Surgery

## 2018-01-21 MED ORDER — HYDROCODONE-ACETAMINOPHEN 5-325 MG PO TABS: ORAL_TABLET | ORAL | 0 refills | Status: DC

## 2018-02-04 ENCOUNTER — Ambulatory Visit (HOSPITAL_COMMUNITY)
Admission: RE | Admit: 2018-02-04 | Discharge: 2018-02-04 | Disposition: A | Discharge status: Home / Self Care | Payer: Medicaid Other | Source: Ambulatory Visit | Attending: Vascular Surgery | Admitting: Vascular Surgery

## 2018-02-04 ENCOUNTER — Ambulatory Visit (HOSPITAL_COMMUNITY)
Admission: RE | Disposition: A | Discharge status: Home / Self Care | Payer: Self-pay | Source: Ambulatory Visit | Attending: Vascular Surgery

## 2018-02-04 DIAGNOSIS — Z7982 Long term (current) use of aspirin: Secondary | ICD-10-CM | POA: Diagnosis not present

## 2018-02-04 DIAGNOSIS — Z880 Allergy status to penicillin: Secondary | ICD-10-CM | POA: Diagnosis not present

## 2018-02-04 DIAGNOSIS — I70219 Atherosclerosis of native arteries of extremities with intermittent claudication, unspecified extremity: Secondary | ICD-10-CM | POA: Diagnosis present

## 2018-02-04 DIAGNOSIS — Z823 Family history of stroke: Secondary | ICD-10-CM | POA: Diagnosis not present

## 2018-02-04 DIAGNOSIS — Z79899 Other long term (current) drug therapy: Secondary | ICD-10-CM | POA: Diagnosis not present

## 2018-02-04 DIAGNOSIS — Z8249 Family history of ischemic heart disease and other diseases of the circulatory system: Secondary | ICD-10-CM | POA: Insufficient documentation

## 2018-02-04 DIAGNOSIS — I70203 Unspecified atherosclerosis of native arteries of extremities, bilateral legs: Secondary | ICD-10-CM | POA: Diagnosis not present

## 2018-02-04 DIAGNOSIS — I70201 Unspecified atherosclerosis of native arteries of extremities, right leg: Secondary | ICD-10-CM | POA: Diagnosis present

## 2018-02-04 DIAGNOSIS — J449 Chronic obstructive pulmonary disease, unspecified: Secondary | ICD-10-CM | POA: Diagnosis not present

## 2018-02-04 DIAGNOSIS — F17210 Nicotine dependence, cigarettes, uncomplicated: Secondary | ICD-10-CM | POA: Diagnosis not present

## 2018-02-04 DIAGNOSIS — I10 Essential (primary) hypertension: Secondary | ICD-10-CM | POA: Diagnosis not present

## 2018-02-04 DIAGNOSIS — I70213 Atherosclerosis of native arteries of extremities with intermittent claudication, bilateral legs: Secondary | ICD-10-CM | POA: Diagnosis not present

## 2018-02-04 HISTORY — PX: LOWER EXTREMITY ANGIOGRAPHY: CATH118251

## 2018-02-04 LAB — POCT I-STAT, CHEM 8
BUN: 14 mg/dL (ref 6–20)
Calcium, Ion: 1.14 mmol/L — ABNORMAL LOW (ref 1.15–1.40)
Chloride: 104 mmol/L (ref 101–111)
Creatinine, Ser: 0.9 mg/dL (ref 0.61–1.24)
Glucose, Bld: 73 mg/dL (ref 65–99)
HCT: 43 % (ref 39.0–52.0)
Hemoglobin: 14.6 g/dL (ref 13.0–17.0)
Potassium: 3.3 mmol/L — ABNORMAL LOW (ref 3.5–5.1)
Sodium: 143 mmol/L (ref 135–145)
TCO2: 28 mmol/L (ref 22–32)

## 2018-02-04 SURGERY — LOWER EXTREMITY ANGIOGRAPHY
Anesthesia: LOCAL

## 2018-02-04 MED ORDER — SODIUM CHLORIDE 0.9 % IV SOLN
INTRAVENOUS | Status: DC
  Administered 2018-02-04: 08:00:00 via INTRAVENOUS

## 2018-02-04 MED ORDER — HYDRALAZINE HCL 20 MG/ML IJ SOLN: 5.0000 mg | INTRAMUSCULAR | Status: DC | PRN

## 2018-02-04 MED ORDER — HEPARIN (PORCINE) IN NACL 2-0.9 UNIT/ML-% IJ SOLN
INTRAMUSCULAR | Status: AC
  Filled 2018-02-04: qty 1000

## 2018-02-04 MED ORDER — ONDANSETRON HCL 4 MG/2ML IJ SOLN: 4.0000 mg | Freq: Four times a day (QID) | INTRAMUSCULAR | Status: DC | PRN

## 2018-02-04 MED ORDER — SODIUM CHLORIDE 0.9 % IV SOLN: 250.0000 mL | INTRAVENOUS | Status: DC | PRN

## 2018-02-04 MED ORDER — LABETALOL HCL 5 MG/ML IV SOLN: 10.0000 mg | INTRAVENOUS | Status: DC | PRN

## 2018-02-04 MED ORDER — HEPARIN (PORCINE) IN NACL 2-0.9 UNIT/ML-% IJ SOLN
INTRAMUSCULAR | Status: AC | PRN
  Administered 2018-02-04 (×2): 500 mL

## 2018-02-04 MED ORDER — SODIUM CHLORIDE 0.9% FLUSH: 3.0000 mL | Freq: Two times a day (BID) | INTRAVENOUS | Status: DC

## 2018-02-04 MED ORDER — MIDAZOLAM HCL 2 MG/2ML IJ SOLN
INTRAMUSCULAR | Status: AC
  Filled 2018-02-04: qty 2

## 2018-02-04 MED ORDER — SODIUM CHLORIDE 0.9 % WEIGHT BASED INFUSION: 1.0000 mL/kg/h | INTRAVENOUS | Status: DC

## 2018-02-04 MED ORDER — SODIUM CHLORIDE 0.9% FLUSH: 3.0000 mL | INTRAVENOUS | Status: DC | PRN

## 2018-02-04 MED ORDER — LIDOCAINE HCL (PF) 1 % IJ SOLN
INTRAMUSCULAR | Status: DC | PRN
  Administered 2018-02-04: 15 mL

## 2018-02-04 MED ORDER — MIDAZOLAM HCL 2 MG/2ML IJ SOLN
INTRAMUSCULAR | Status: DC | PRN
  Administered 2018-02-04: 1 mg via INTRAVENOUS

## 2018-02-04 MED ORDER — FENTANYL CITRATE (PF) 100 MCG/2ML IJ SOLN
INTRAMUSCULAR | Status: AC
  Filled 2018-02-04: qty 2

## 2018-02-04 MED ORDER — FENTANYL CITRATE (PF) 100 MCG/2ML IJ SOLN
INTRAMUSCULAR | Status: DC | PRN
  Administered 2018-02-04: 50 ug via INTRAVENOUS

## 2018-02-04 MED ORDER — ACETAMINOPHEN 325 MG PO TABS: 650.0000 mg | ORAL_TABLET | ORAL | Status: DC | PRN

## 2018-02-04 MED ORDER — LIDOCAINE HCL 1 % IJ SOLN
INTRAMUSCULAR | Status: AC
  Filled 2018-02-04: qty 20

## 2018-02-04 MED ORDER — IODIXANOL 320 MG/ML IV SOLN
INTRAVENOUS | Status: DC | PRN
  Administered 2018-02-04: 97 mg via INTRAVENOUS

## 2018-02-04 SURGICAL SUPPLY — 10 items
CATH OMNI FLUSH 5F 65CM (CATHETERS) ×1 IMPLANT
COVER PRB 48X5XTLSCP FOLD TPE (BAG) IMPLANT
COVER PROBE 5X48 (BAG) ×2
KIT MICROPUNCTURE NIT STIFF (SHEATH) ×1 IMPLANT
KIT PV (KITS) ×2 IMPLANT
SHEATH AVANTI 11CM 5FR (SHEATH) ×1 IMPLANT
SYR MEDRAD MARK V 150ML (SYRINGE) ×2 IMPLANT
TRANSDUCER W/STOPCOCK (MISCELLANEOUS) ×2 IMPLANT
TRAY PV CATH (CUSTOM PROCEDURE TRAY) ×2 IMPLANT
WIRE BENTSON .035X145CM (WIRE) ×1 IMPLANT

## 2018-02-04 NOTE — Discharge Instructions (Signed)
° °  [facility]  Discharge Instructions  Lower Extremity Angiogram; Angioplasty/Stenting  Please refer to the following instructions for your post-procedure care. Your surgeon or physician assistant will discuss any changes with you.  Activity  Avoid lifting more than 8 pounds (1 gallons of milk) for 72 hours (3 days) after your procedure. You may walk as much as you can tolerate. It's OK to drive after 72 hours.  Bathing/Showering  You may shower the day after your procedure. If you have a bandage, you may remove it at 24- 48 hours. Clean your incision site with mild soap and water. Pat the area dry with a clean towel.  Diet  Drink plenty of fluid over the next 3 days to stay hydrated. Resume your pre-procedure diet. There are no special food restrictions following this procedure. All patients with peripheral vascular disease should follow a low fat/low cholesterol diet. In order to heal from your surgery, it is CRITICAL to get adequate nutrition. Your body requires vitamins, minerals, and protein. Vegetables are the best source of vitamins and minerals. Vegetables also provide the perfect balance of protein. Processed food has little nutritional value, so try to avoid this.  Medications  Resume taking all of your medications unless your doctor tells you not to. If your incision is causing pain, you may take over-the-counter pain relievers such as acetaminophen (Tylenol)  Follow Up  Follow up will be arranged at the time of your procedure. You may have an office visit scheduled or may be scheduled for surgery. Ask your surgeon if you have any questions.  Please call us immediately for any of the following conditions: Severe or worsening pain your legs or feet at rest or with walking. Increased pain, redness, drainage at your groin puncture site. Fever of 101 degrees or higher. If you have any mild or slow bleeding from your puncture site: lie down,  apply firm constant pressure over the area with a piece of gauze or a clean wash cloth for 30 minutes- no peeking!, call 911 right away if you are still bleeding after 30 minutes, or if the bleeding is heavy and unmanageable.  Reduce your risk factors of vascular disease:  Stop smoking. If you would like help call [facility] at 1-800-QUIT-NOW [phone]) or [place] at 4751229349. Manage your cholesterol Maintain a desired weight Control your diabetes Keep your blood pressure down  If you have any questions, please call the office at [phone]

## 2018-02-04 NOTE — Interval H&P Note (Signed)
   History and Physical Update  The patient was interviewed and re-examined.  The patient's previous History and Physical has been reviewed and is unchanged from my consult.  There is no change in the plan of care: aortogram, bilateral leg runoff, and possible intervention R leg.   I discussed with the patient the nature of angiographic procedures, especially the limited patencies of any endovascular intervention.    The patient is aware of that the risks of an angiographic procedure include but are not limited to: bleeding, infection, access site complications, renal failure, embolization, rupture of vessel, dissection, arteriovenous fistula, possible need for emergent surgical intervention, possible need for surgical procedures to treat the patient's pathology, anaphylactic reaction to contrast, and stroke and death.    The patient is aware of the risks and agrees to proceed.   Adele Barthel, MD, FACS [facility] [place] Office: [phone] Pager: [phone]  02/04/2018, 9:05 AM

## 2018-02-04 NOTE — Progress Notes (Signed)
Site area: left groin fa sheath Site Prior to Removal:  Level 0 Pressure Applied For: 20 minutes Manual:   yes Patient Status During Pull:  stable Post Pull Site:  Level 0 Post Pull Instructions Given:  yes Post Pull Pulses Present: left pt dopplered Dressing Applied:  Gauze and tegaderm Bedrest begins @ 1125 Comments:

## 2018-02-04 NOTE — Op Note (Signed)
OPERATIVE NOTE   PROCEDURE: 1.  Left common femoral artery cannulation under ultrasound guidance 2.  Aortogram 3.  Bilateral runoff 4.  Conscious sedation for 25 minutes  PRE-OPERATIVE DIAGNOSIS: right superficial femoral artery sub-total occlusion  POST-OPERATIVE DIAGNOSIS: same as above   SURGEON: Adele Barthel, MD  ANESTHESIA: conscious sedation  ESTIMATED BLOOD LOSS: 50 cc  CONTRAST: 97 cc  FINDING(S):  Aorta: patent, ectatic lumen suggestive of abdominal aortic aneurysm   Superior mesenteric artery: patent Celiac artery: patent   Right Left  RA patent patent  CIA Patent, ectatic lumen suggestive of aneurysm Patent, ectatic lumen suggestive of aneurysm  EIA Patent Patent  IIA Patent Patent  CFA Patent Patent  SFA Patent proximally, calcific chronic total occlusion distally Patent throughout, calcific disease from mid-segment distal, serial stenoses in mid-segment with 3 50% stenoses and sub-total occlusion distally  PFA Patent, extensive collaterals Patent, some collaterals evident  Pop Occluded, minimal island reconstitutes at knee level Occluded  Trif Patent, reconstitutes from collaterals Patent  AT Patent, reconstitutes from collaterals Patent proximally, occludes shortly after take off  Pero Patent Patent  PT Occluded Patent    SPECIMEN(S):  none  INDICATIONS:   Dillon [name] is a 67 y.o. male who presents with bilateral short distance claudication.  His non-invasive studies were suggestive of possible sub-total occlusion in the setting of possible right femoropopliteal aneurysm.  The patient presents for: aortogram, bilateral leg runoff and possible right leg intervention.  I discussed with the patient the nature of angiographic procedures, especially the limited patencies of any endovascular intervention.  The patient is aware of that the risks of an angiographic procedure include but are not limited to: bleeding, infection, access site complications, renal  failure, embolization, rupture of vessel, dissection, possible need for emergent surgical intervention, possible need for surgical procedures to treat the patient's pathology, and stroke and death.  The patient is aware of the risks and agrees to proceed.  DESCRIPTION: After full informed consent was obtained from the patient, the patient was brought back to the angiography suite.  The patient was placed supine upon the angiography table and connected to monitoring equipment.  The patient was then given conscious sedation, the amounts of which are documented in the patient's chart.  The patient was prepped and drape in the standard fashion for an angiographic procedure.  At this point, attention was turned to the left groin.   Under ultrasound guidance, the subcutaneous tissue surrounding the left common femoral artery was anesthesized with 1% lidocaine with epinephrine.  The artery was then cannulated with a micropuncture needle.  The microwire was advanced into the iliac arterial system.  The needle was exchanged for a microsheath, which was loaded into the common femoral artery over the wire.  The microwire was exchanged for a Bentson wire which was advanced into the aorta.  The microsheath was then exchanged for a 5-Fr sheath which was loaded into the common femoral artery.  The Omniflush catheter was then loaded over the wire up to the level of L1.  The catheter was connected to the power injector circuit.  After de-airring and de-clotting the circuit, a power injector aortogram was completed.  The findings are listed above.  Due to the multiple right angle turns in this patient's iliac arterial system, I doubt crossing the bifurcation is possible.  At this point, I pulled the catheter down to the distal aorta.  An automated bilateral leg runoff was completed.  The findings are listed  above.  I replaced the wire into the catheter, straightening out the crook in the catheter.  Both were removed from the  sheath together.  The sheath was aspirated.  No clots were present and the sheath was reloaded with heparinized saline.    Based on the images, I suspect patient has a small abdominal aortic aneurysm, small bilateral common iliac artery aneurysm and occluded right popliteal artery aneurysm.  Patient will need a CTA Abd/pelvis with runoff to fully image the abdomen, pelvis and legs to get anatomic data on his aneurysm.  He will likely need a right common femoral artery to distal popliteal artery bypass with right popliteal artery exclusion.  Cardiac preoperative optimization may be needed also.   COMPLICATIONS: none  CONDITION: stable   Adele Barthel, MD, [facility] [place] Office: [phone] Pager: [phone]  02/04/2018, 11:03 AM

## 2018-02-05 ENCOUNTER — Encounter (HOSPITAL_COMMUNITY): Payer: Self-pay | Admitting: Vascular Surgery

## 2018-02-05 MED FILL — Heparin Sodium (Porcine) 2 Unit/ML in Sodium Chloride 0.9%: INTRAMUSCULAR | Qty: 1000 | Status: AC

## 2018-02-05 MED FILL — Lidocaine HCl Local Inj 1%: INTRAMUSCULAR | Qty: 20 | Status: AC

## 2018-02-08 ENCOUNTER — Other Ambulatory Visit: Payer: Self-pay

## 2018-02-08 DIAGNOSIS — R6889 Other general symptoms and signs: Secondary | ICD-10-CM

## 2018-02-08 DIAGNOSIS — I70211 Atherosclerosis of native arteries of extremities with intermittent claudication, right leg: Secondary | ICD-10-CM

## 2018-02-08 DIAGNOSIS — M79604 Pain in right leg: Secondary | ICD-10-CM

## 2018-02-08 DIAGNOSIS — Z01812 Encounter for preprocedural laboratory examination: Secondary | ICD-10-CM

## 2018-02-10 ENCOUNTER — Telehealth: Payer: Self-pay | Admitting: Surgery

## 2018-02-10 NOTE — Telephone Encounter (Signed)
Left mssg with pts nephew, he will deliver the info.. unable to leave mssg on pts # listed mld lttr  CT 4/23  [place] and OV 4/24

## 2018-02-10 NOTE — Telephone Encounter (Signed)
-----   Message from Mena Goes, RN sent at 02/04/2018 11:32 AM EDT ----- Regarding: 2 weeks, needs CTA, vein map and cardio clearance prior   ----- Message ----- From: Conrad Lightstreet, MD Sent: 02/04/2018  11:11 AM To: [facility] [address]  Dillon [name] [phone] 1951-05-25   PROCEDURE: 1.  Left common femoral artery cannulation under ultrasound guidance 2.  Aortogram 3.  Bilateral runoff 4.  Conscious sedation for 25 minutes  Follow-up: 2 weeks  Orders(s) for follow-up:  1.  CTA abd/pelvis with B leg runoff 2.  B GSV vein mapping 3.  Preop cardiac c/s for R fem-pop bypass for popliteal aneurysm

## 2018-02-15 NOTE — Progress Notes (Addendum)
Established Intermittent Claudication   History of Present Illness   Dillon [name] is a 67 y.o. (1951-10-11) male who presents with chief complaint: improved leg pain.    Patient recent underwent: aortogram with bilateral leg runoff.  Findings are suggestive of abdominal aortic aneurysm, bilateral common iliac artery and possible thrombosed right popliteal artery aneurysm.  Patient returns for evaluation for possible right femoropopliteal bypass, popliteal artery aneurysm exclusion.  Pt had his CTA abd/pelvis today.  The patient's symptoms have improved.  The patient's symptoms are: short distance intermittent claudication.  The patient's treatment regimen currently included: maximal medical management.  Past Medical History:  Diagnosis Date  . Asthma   . COPD (chronic obstructive pulmonary disease) ([place])   . Hypertension   . Nicotine addiction     Past Surgical History:  Procedure Laterality Date  . KNEE ARTHROSCOPY     both knees   . LOWER EXTREMITY ANGIOGRAPHY N/A 02/04/2018   Procedure: LOWER EXTREMITY ANGIOGRAPHY;  Surgeon: Conrad Yale, MD;  Location: [facility];  Service: Cardiovascular;  Laterality: N/A;    Social History   Socioeconomic History  . Marital status: Single    Spouse name: Not on file  . Number of children: Not on file  . Years of education: Not on file  . Highest education level: Not on file  Occupational History  . Not on file  Social Needs  . Financial resource strain: Not on file  . Food insecurity:    Worry: Not on file    Inability: Not on file  . Transportation needs:    Medical: Not on file    Non-medical: Not on file  Tobacco Use  . Smoking status: Current Some Day Smoker    Packs/day: 0.75    Years: 48.00    Pack years: 36.00    Types: Cigarettes    Start date: 10/16/1969  . Smokeless tobacco: Never Used  . Tobacco comment: 2-3 per day  Substance and Sexual Activity  . Alcohol use: Yes    Alcohol/week: 0.0 oz   Comment: occassional , max of 72 oz pwr month  . Drug use: No  . Sexual activity: Yes    Partners: Female  Lifestyle  . Physical activity:    Days per week: Not on file    Minutes per session: Not on file  . Stress: Not on file  Relationships  . Social connections:    Talks on phone: Not on file    Gets together: Not on file    Attends religious service: Not on file    Active member of club or organization: Not on file    Attends meetings of clubs or organizations: Not on file    Relationship status: Not on file  . Intimate partner violence:    Fear of current or ex partner: Not on file    Emotionally abused: Not on file    Physically abused: Not on file    Forced sexual activity: Not on file  Other Topics Concern  . Not on file  Social History Narrative  . Not on file     Family History  Problem Relation Age of Onset  . Heart attack Mother   . Diabetes Mother   . Hypertension Mother   . Stroke Brother   . Hypertension Sister   . Hypertension Sister   . Hypertension Sister   . Hypertension Brother     Current Outpatient Medications  Medication Sig Dispense Refill  .  amLODipine (NORVASC) 10 MG tablet TAKE 1 TABLET BY MOUTH EVERY DAY (Patient taking differently: TAKE 10 MG BY MOUTH EVERY DAY) 90 tablet 1  . aspirin EC 81 MG tablet Take 81 mg by mouth daily.    . cilostazol (PLETAL) 50 MG tablet Take 1 tablet (50 mg total) by mouth 2 (two) times daily. (Patient not taking: Reported on 02/01/2018) 60 tablet 6  . cyclobenzaprine (FLEXERIL) 10 MG tablet TAKE 1 TABLET BY MOUTH EVERYDAY AT BEDTIME (Patient taking differently: TAKE 10 MG BY MOUTH EVERYDAY AT BEDTIME) 30 tablet 1  . ergocalciferol (VITAMIN D2) 50000 units capsule Take 1 capsule (50,000 Units total) by mouth once a week. One capsule once weekly (Patient taking differently: Take 50,000 Units by mouth every Tuesday. ) 12 capsule 3  . gabapentin (NEURONTIN) 300 MG capsule TAKE 1 CAPSULE (300 MG TOTAL) BY MOUTH AT  BEDTIME. 30 capsule 2  . HYDROcodone-acetaminophen (NORCO/VICODIN) 5-325 MG tablet One tablet every six hours as needed for pain.  30 day limit. (Patient taking differently: Take 1 tablet by mouth every 6 (six) hours as needed for moderate pain. ) 100 tablet 0  . ipratropium-albuterol (DUONEB) 0.5-2.5 (3) MG/3ML SOLN One inhalation every 8 hours as needed, for severe wheezing (Patient taking differently: Take 3 mLs by nebulization every 8 (eight) hours as needed (for severe wheezing). ) 360 mL 3  . meloxicam (MOBIC) 15 MG tablet TAKE 1 TABLET ONCE DAILY AS NEEDED FOR UNCONTROLLED ARTHRITIS PAIN (Patient taking differently: Take 15 mg by mouth daily as needed for pain. ) 30 tablet 2  . pravastatin (PRAVACHOL) 40 MG tablet TAKE 1 TABLET BY MOUTH EVERY DAY IN THE EVENING (Patient taking differently: TAKE 40 MG BY MOUTH EVERY DAY IN THE EVENING) 90 tablet 1  . spironolactone (ALDACTONE) 50 MG tablet TAKE 1 TABLET BY MOUTH EVERY DAY (Patient taking differently: TAKE 50 MG BY MOUTH EVERY DAY) 90 tablet 1  . SYMBICORT 160-4.5 MCG/ACT inhaler INHALE 2 PUFFS INTO THE LUNGS 2 (TWO) TIMES DAILY. 10.2 Inhaler 5   No current facility-administered medications for this visit.      Allergies  Allergen Reactions  . Penicillins Hives and Other (See Comments)    Has patient had a PCN reaction causing immediate rash, facial/tongue/throat swelling, SOB or lightheadedness with hypotension: Unknown Has patient had a PCN reaction causing severe rash involving mucus membranes or skin necrosis: No Has patient had a PCN reaction that required hospitalization: Yes Has patient had a PCN reaction occurring within the last 10 years: No If all of the above answers are "NO", then may proceed with Cephalosporin use.     REVIEW OF SYSTEMS (negative unless checked):   Cardiac:  []  Chest pain or chest pressure? []  Shortness of breath upon activity? []  Shortness of breath when lying flat? []  Irregular heart  rhythm?  Vascular:  [x]  Pain in calf, thigh, or hip brought on by walking? []  Pain in feet at night that wakes you up from your sleep? []  Blood clot in your veins? []  Leg swelling?  Pulmonary:  []  Oxygen at home? []  Productive cough? []  Wheezing?  Neurologic:  []  Sudden weakness in arms or legs? []  Sudden numbness in arms or legs? []  Sudden onset of difficult speaking or slurred speech? []  Temporary loss of vision in one eye? []  Problems with dizziness?  Gastrointestinal:  []  Blood in stool? []  Vomited blood?  Genitourinary:  []  Burning when urinating? []  Blood in urine?  Psychiatric:  []  Major depression  Hematologic:  []  Bleeding problems? []  Problems with blood clotting?  Dermatologic:  []  Rashes or ulcers?  Constitutional:  []  Fever or chills?  Ear/Nose/Throat:  []  Change in hearing? []  Nose bleeds? []  Sore throat?  Musculoskeletal:  []  Back pain? []  Joint pain? []  Muscle pain?   Physical Examination   Vitals:   02/17/18 1346  BP: (!) 147/85  Pulse: 62  Resp: 16  Temp: (!) 97.3 F (36.3 C)  TempSrc: Oral  SpO2: 98%  Weight: 164 lb (74.4 kg)  Height: 6\' 2"  (1.88 m)   Body mass index is 21.06 kg/m.  General Alert, O x 3, WD, NAD  Pulmonary Sym exp, good B air movt, CTA B  Cardiac RRR, Nl S1, S2, no Murmurs, No rubs, No S3,S4  Vascular Vessel Right Left  Radial Palpable Palpable  Brachial Palpable Palpable  Carotid Palpable, No Bruit Palpable, No Bruit  Aorta Not palpable N/A  Femoral Palpable Palpable  Popliteal Not palpable Not palpable  PT Not palpable Palpable  DP Not palpable Not palpable    Gastro- intestinal soft, non-distended, non-tender to palpation, No guarding or rebound, no HSM, no masses, no CVAT B, No palpable prominent aortic pulse,    Musculo- skeletal M/S 5/5 throughout  , Extremities without ischemic changes  , No edema present, No visible varicosities , No Lipodermatosclerosis present, no harvest incisions in  either leg  Neurologic Pain and light touch intact in extremities , Motor exam as listed above    Non-Invasive Vascular imaging   B GSV Mapping (02/17/2018)  R: not found  L: inadequate   Radiology     Ct Angio Ao+bifem W & Or Wo Contrast  Result Date: 02/16/2018 CLINICAL DATA:  Bilateral lower extremity pain EXAM: CT ANGIOGRAPHY OF ABDOMINAL AORTA WITH ILIOFEMORAL RUNOFF TECHNIQUE: Multidetector CT imaging of the abdomen, pelvis and lower extremities was performed using the standard protocol during bolus administration of intravenous contrast. Multiplanar CT image reconstructions and MIPs were obtained to evaluate the vascular anatomy. CONTRAST:  143mL ISOVUE-370 IOPAMIDOL (ISOVUE-370) INJECTION 76% COMPARISON:  None. FINDINGS: VASCULAR Aorta: Aorta is patent and mildly aneurysmal with maximal AP and transverse diameters of 3.3 and 3.6 cm. There are atherosclerotic calcifications as well as some smooth soft plaque scattered throughout the abdominal aorta. Celiac: Moderate narrowing secondary to median arcuate ligament syndrome. Branch vessels patent. SMA: Patent.  Replaced right hepatic artery anatomy. Renals: Single renal arteries are widely patent. IMA: Moderate disease at the origin with atherosclerotic calcified plaque. Branch vessels patent. RIGHT Lower Extremity Inflow: Right common iliac artery is aneurysmal with a maximal diameter of 1.8 cm. There is no luminal narrowing. The right external iliac artery is moderately diseased with calcified and soft plaque but there is no significant focal narrowing. Right internal iliac artery is mildly diffusely diseased. Outflow: Right common femoral artery and profunda femoral artery are patent. Mild atherosclerotic changes in the proximal and mid right superficial femoral artery. There is more severe disease in the abductor canal. Significant focal narrowing occurs at table position 925.5, and 985.5. Runoff: The popliteal artery is occluded well above  the knee joint and reconstitutes above the knee joint. It is diminutive. Posterior tibial, peroneal, and anterior tibial arteries are patent to the ankle. Tibioperoneal trunk is also patent. LEFT Lower Extremity Inflow: Left common iliac artery is aneurysmal with a maximal diameter of 1.7 cm. There is no luminal narrowing. The external iliac artery is mildly diffusely disease without significant focal narrowing. There is significant narrowing at  the origin of the left internal iliac artery with post stenotic dilatation measuring up to 1.0 cm in diameter. Outflow: Left common femoral and profunda femoral arteries are patent. The proximal and mid left superficial femoral artery are patent. There is significant narrowing in the abductor canal at table position 958.5. Runoff: There is significant narrowing in the popliteal artery at table position 1003.5. The popliteal artery is there after moderately diffusely diseased. There is prominent calcified plaque throughout its course. The anterior tibial artery is occluded. Posterior tibial and peroneal arteries are patent to the ankle. Review of the MIP images confirms the above findings. NON-VASCULAR Lower chest: Stable 1.2 cm nodule in the anterior basal segment of the right lower lobe. Scattered emphysema. Hepatobiliary: Gallbladder is decompressed. Liver is within normal limits. Pancreas: Unremarkable Spleen: Unremarkable Adrenals/Urinary Tract: Adrenal glands are within normal limits. Right kidney is unremarkable. Subcentimeter hypodensities in the left kidney are nonspecific. Bladder is decompressed. Stomach/Bowel: Normal appendix. No obvious mass in the colon. No evidence of small-bowel obstruction. Lymphatic: No abnormal retroperitoneal adenopathy. Reproductive: Prostate is markedly enlarged. Other: No free fluid. Musculoskeletal: No vertebral compression deformity. Advanced degenerative disc disease. IMPRESSION: VASCULAR Infrarenal abdominal aortic aneurysm is  present with a maximal diameter of 3.6 cm. Recommend followup by ultrasound in 2 years. This recommendation follows ACR consensus guidelines: White Paper of the ACR Incidental Findings Committee II on Vascular Findings. J Am Coll Radiol 2013; 10:789-794. No significant narrowing in the aorta or iliac arterial system. Common femoral arteries patent. Significant narrowing in the distal right superficial femoral artery. Right popliteal artery occlusion above the knee joint. Three vessel runoff to the right ankle. Significant narrowing in the distal left superficial femoral artery and popliteal artery. Two vessel runoff to the left ankle. NON-VASCULAR Prostate enlargement.  Correlate with PSA and physical exam. Stable pulmonary nodules supporting benign etiology. Please note that annual CT chest screening was recommended on the prior CT dated 04/20/2017. The entire chest was not imaged on this study. Electronically Signed   By: Marybelle Killings M.D.   On: 02/16/2018 16:00   I reviewed this patient's CTA, he has a small AAA and small B CIA aneurysm.  I don't see any evidence of femoropopliteal aneurysm in either leg.  Runoff in both legs consistent with angiography.   Medical Decision Making   PACER DORN is a 67 y.o. male who presents with:  bilateral leg intermittent claudication without evidence of critical limb ischemia, possible abdominal aortic aneurysm and bilateral common iliac artery aneurysms,    At this point, the patient has been become asx with improvement in his intermittent claudication.  Essentially, this patient requires a right common femoral artery to tibioperoneal trunk bypass unless the below-the-knee popliteal artery can be salvaged.  The CTA suggests chronic occlusion of the below-the-knee popliteal artery, so a fem-tib bypass is likely needed.  Given fem-tib bypass are frequently associated with limb loss when they occlude, I recommended we hold off on attempt such until absolutely  necessary given no adequate greater saphenous vein was identified in either leg.  Based on the patient's vascular studies and examination, I have offered the patient: repeat ABI in 6 months..  I discussed in depth with the patient the nature of atherosclerosis, and emphasized the importance of maximal medical management including strict control of blood pressure, blood glucose, and lipid levels, antiplatelet agents, obtaining regular exercise, and cessation of smoking.    The patient is aware that without maximal medical management the underlying atherosclerotic  disease process will progress, limiting the benefit of any interventions.  The patient is currently on a statin: Pravachol.   The patient is currently on an anti-platelet: ASA.  Thank you for allowing [place] to participate in this patient's care.   Adele Barthel, MD, FACS [facility] Office: [phone] Pager: [phone]

## 2018-02-16 ENCOUNTER — Other Ambulatory Visit: Payer: Self-pay

## 2018-02-16 ENCOUNTER — Ambulatory Visit (HOSPITAL_COMMUNITY)
Admission: RE | Admit: 2018-02-16 | Discharge: 2018-02-16 | Disposition: A | Discharge status: Home / Self Care | Payer: Medicaid Other | Source: Ambulatory Visit | Attending: Vascular Surgery | Admitting: Vascular Surgery

## 2018-02-16 DIAGNOSIS — R6889 Other general symptoms and signs: Secondary | ICD-10-CM | POA: Insufficient documentation

## 2018-02-16 DIAGNOSIS — I714 Abdominal aortic aneurysm, without rupture: Secondary | ICD-10-CM | POA: Diagnosis not present

## 2018-02-16 DIAGNOSIS — I70211 Atherosclerosis of native arteries of extremities with intermittent claudication, right leg: Secondary | ICD-10-CM | POA: Insufficient documentation

## 2018-02-16 DIAGNOSIS — N400 Enlarged prostate without lower urinary tract symptoms: Secondary | ICD-10-CM | POA: Insufficient documentation

## 2018-02-16 DIAGNOSIS — Z01812 Encounter for preprocedural laboratory examination: Secondary | ICD-10-CM

## 2018-02-16 DIAGNOSIS — R918 Other nonspecific abnormal finding of lung field: Secondary | ICD-10-CM | POA: Insufficient documentation

## 2018-02-16 DIAGNOSIS — M79604 Pain in right leg: Secondary | ICD-10-CM | POA: Diagnosis not present

## 2018-02-16 MED ORDER — IOPAMIDOL (ISOVUE-370) INJECTION 76%
150.0000 mL | Freq: Once | INTRAVENOUS | Status: AC | PRN
  Administered 2018-02-16: 150 mL via INTRAVENOUS

## 2018-02-17 ENCOUNTER — Encounter: Payer: Self-pay | Admitting: Vascular Surgery

## 2018-02-17 ENCOUNTER — Other Ambulatory Visit: Payer: Self-pay

## 2018-02-17 ENCOUNTER — Ambulatory Visit (HOSPITAL_COMMUNITY)
Admission: RE | Admit: 2018-02-17 | Discharge: 2018-02-17 | Disposition: A | Discharge status: Home / Self Care | Payer: Medicare Other | Source: Ambulatory Visit | Attending: Vascular Surgery | Admitting: Vascular Surgery

## 2018-02-17 ENCOUNTER — Ambulatory Visit (INDEPENDENT_AMBULATORY_CARE_PROVIDER_SITE_OTHER): Payer: Medicaid Other | Admitting: Vascular Surgery

## 2018-02-17 VITALS — BP 147/85 | HR 62 | Temp 97.3°F | Resp 16 | Ht 74.0 in | Wt 164.0 lb

## 2018-02-17 DIAGNOSIS — I878 Other specified disorders of veins: Secondary | ICD-10-CM | POA: Diagnosis not present

## 2018-02-17 DIAGNOSIS — Z01818 Encounter for other preprocedural examination: Secondary | ICD-10-CM | POA: Diagnosis present

## 2018-02-17 DIAGNOSIS — I70211 Atherosclerosis of native arteries of extremities with intermittent claudication, right leg: Secondary | ICD-10-CM | POA: Diagnosis not present

## 2018-02-17 DIAGNOSIS — Z01812 Encounter for preprocedural laboratory examination: Secondary | ICD-10-CM | POA: Diagnosis not present

## 2018-02-24 ENCOUNTER — Other Ambulatory Visit: Payer: Self-pay | Admitting: Orthopedic Surgery

## 2018-02-26 MED ORDER — HYDROCODONE-ACETAMINOPHEN 5-325 MG PO TABS: ORAL_TABLET | ORAL | 0 refills | Status: DC

## 2018-03-03 ENCOUNTER — Other Ambulatory Visit: Payer: Self-pay | Admitting: Orthopaedic Surgery

## 2018-03-08 ENCOUNTER — Other Ambulatory Visit: Payer: Self-pay

## 2018-03-08 DIAGNOSIS — I70211 Atherosclerosis of native arteries of extremities with intermittent claudication, right leg: Secondary | ICD-10-CM

## 2018-03-18 ENCOUNTER — Encounter: Payer: Self-pay | Admitting: Orthopaedic Surgery

## 2018-03-18 ENCOUNTER — Ambulatory Visit: Payer: Medicaid Other | Admitting: Orthopaedic Surgery

## 2018-03-18 VITALS — BP 135/84 | HR 76 | Temp 96.9°F | Ht 74.5 in | Wt 159.0 lb

## 2018-03-18 DIAGNOSIS — F17210 Nicotine dependence, cigarettes, uncomplicated: Secondary | ICD-10-CM | POA: Diagnosis not present

## 2018-03-18 DIAGNOSIS — G8929 Other chronic pain: Secondary | ICD-10-CM

## 2018-03-18 DIAGNOSIS — M25512 Pain in left shoulder: Secondary | ICD-10-CM

## 2018-03-18 MED ORDER — CYCLOBENZAPRINE HCL 10 MG PO TABS: 10.0000 mg | ORAL_TABLET | Freq: Every day | ORAL | 1 refills | Status: DC

## 2018-03-18 NOTE — Progress Notes (Signed)
[name], male DOB:1950/11/13, 67 y.o. FOY:[id number]  Chief Complaint  Patient presents with  . Shoulder Pain    left     HPI  Dillon [name] is a 67 y.o. male who has chronic pain of the left shoulder.  He has had a flare up of pain this past week as he has done activity overhead.  He has no swelling, no redness, no numbness.  He is taking his medicine. HPI  Body mass index is 20.14 kg/m.  ROS  Review of Systems  Respiratory: Positive for shortness of breath. Negative for cough.   Cardiovascular: Negative for chest pain and leg swelling.  Endocrine: Negative for cold intolerance.  Musculoskeletal: Positive for arthralgias and myalgias.  Allergic/Immunologic: Positive for environmental allergies.  All other systems reviewed and are negative.   Past Medical History:  Diagnosis Date  . Asthma   . COPD (chronic obstructive pulmonary disease) ([place])   . Hypertension   . Nicotine addiction     Past Surgical History:  Procedure Laterality Date  . KNEE ARTHROSCOPY     both knees   . LOWER EXTREMITY ANGIOGRAPHY N/A 02/04/2018   Procedure: LOWER EXTREMITY ANGIOGRAPHY;  Surgeon: Conrad Sanders, MD;  Location: [facility];  Service: Cardiovascular;  Laterality: N/A;    Family History  Problem Relation Age of Onset  . Heart attack Mother   . Diabetes Mother   . Hypertension Mother   . Stroke Brother   . Hypertension Sister   . Hypertension Sister   . Hypertension Sister   . Hypertension Brother     Social History Social History   Tobacco Use  . Smoking status: Current Some Day Smoker    Packs/day: 0.75    Years: 48.00    Pack years: 36.00    Types: Cigarettes    Start date: 10/16/1969  . Smokeless tobacco: Never Used  . Tobacco comment: 1 pk last 2-3 days.   Substance Use Topics  . Alcohol use: Yes    Alcohol/week: 0.0 oz    Comment: occassional , max of 72 oz pwr month  . Drug use: No    Allergies  Allergen Reactions  . Penicillins  Hives and Other (See Comments)    Has patient had a PCN reaction causing immediate rash, facial/tongue/throat swelling, SOB or lightheadedness with hypotension: Unknown Has patient had a PCN reaction causing severe rash involving mucus membranes or skin necrosis: No Has patient had a PCN reaction that required hospitalization: Yes Has patient had a PCN reaction occurring within the last 10 years: No If all of the above answers are "NO", then may proceed with Cephalosporin use.     Current Outpatient Medications  Medication Sig Dispense Refill  . amLODipine (NORVASC) 10 MG tablet TAKE 1 TABLET BY MOUTH EVERY DAY (Patient taking differently: TAKE 10 MG BY MOUTH EVERY DAY) 90 tablet 1  . aspirin EC 81 MG tablet Take 81 mg by mouth daily.    . cilostazol (PLETAL) 50 MG tablet Take 1 tablet (50 mg total) by mouth 2 (two) times daily. 60 tablet 6  . cyclobenzaprine (FLEXERIL) 10 MG tablet Take 1 tablet (10 mg total) by mouth at bedtime. 30 tablet 1  . ergocalciferol (VITAMIN D2) 50000 units capsule Take 1 capsule (50,000 Units total) by mouth once a week. One capsule once weekly (Patient taking differently: Take 50,000 Units by mouth every Tuesday. ) 12 capsule 3  . gabapentin (NEURONTIN) 300 MG capsule TAKE 1 CAPSULE (  300 MG TOTAL) BY MOUTH AT BEDTIME. 30 capsule 2  . HYDROcodone-acetaminophen (NORCO/VICODIN) 5-325 MG tablet One tablet every six hours as needed for pain.  30 day limit. 100 tablet 0  . ipratropium-albuterol (DUONEB) 0.5-2.5 (3) MG/3ML SOLN One inhalation every 8 hours as needed, for severe wheezing (Patient taking differently: Take 3 mLs by nebulization every 8 (eight) hours as needed (for severe wheezing). ) 360 mL 3  . meloxicam (MOBIC) 15 MG tablet TAKE 1 TABLET ONCE DAILY AS NEEDED FOR UNCONTROLLED ARTHRITIS PAIN (Patient taking differently: Take 15 mg by mouth daily as needed for pain. ) 30 tablet 2  . pravastatin (PRAVACHOL) 40 MG tablet TAKE 1 TABLET BY MOUTH EVERY DAY IN THE  EVENING (Patient taking differently: TAKE 40 MG BY MOUTH EVERY DAY IN THE EVENING) 90 tablet 1  . spironolactone (ALDACTONE) 50 MG tablet TAKE 1 TABLET BY MOUTH EVERY DAY (Patient taking differently: TAKE 50 MG BY MOUTH EVERY DAY) 90 tablet 1  . SYMBICORT 160-4.5 MCG/ACT inhaler INHALE 2 PUFFS INTO THE LUNGS 2 (TWO) TIMES DAILY. 10.2 Inhaler 5   No current facility-administered medications for this visit.      Physical Exam  Blood pressure 135/84, pulse 76, temperature (!) 96.9 F (36.1 C), height 6' 2.5" (1.892 m), weight 159 lb (72.1 kg).  Constitutional: overall normal hygiene, normal nutrition, well developed, normal grooming, normal body habitus. Assistive device:none  Musculoskeletal: gait and station Limp none, muscle tone and strength are normal, no tremors or atrophy is present.  .  Neurological: coordination overall normal.  Deep tendon reflex/nerve stretch intact.  Sensation normal.  Cranial nerves II-XII intact.   Skin:   Normal overall no scars, lesions, ulcers or rashes. No psoriasis.  Psychiatric: Alert and oriented x 3.  Recent memory intact, remote memory unclear.  Normal mood and affect. Well groomed.  Good eye contact.  Cardiovascular: overall no swelling, no varicosities, no edema bilaterally, normal temperatures of the legs and arms, no clubbing, cyanosis and good capillary refill.  Lymphatic: palpation is normal.  Examination of left Upper Extremity is done.  Inspection:   Overall:  Elbow non-tender without crepitus or defects, forearm non-tender without crepitus or defects, wrist non-tender without crepitus or defects, hand non-tender.    Shoulder: with glenohumeral joint tenderness, without effusion.   Upper arm: without swelling and tenderness   Range of motion:   Overall:  Full range of motion of the elbow, full range of motion of wrist and full range of motion in fingers.   Shoulder:  left  165 degrees forward flexion; 150 degrees abduction; 35 degrees  internal rotation, 35 degrees external rotation, 15 degrees extension, 40 degrees adduction.   Stability:   Overall:  Shoulder, elbow and wrist stable   Strength and Tone:   Overall full shoulder muscles strength, full upper arm strength and normal upper arm bulk and tone. All other systems reviewed and are negative   The patient has been educated about the nature of the problem(s) and counseled on treatment options.  The patient appeared to understand what I have discussed and is in agreement with it.  Encounter Diagnoses  Name Primary?  . Chronic left shoulder pain Yes  . Cigarette nicotine dependence without complication     PLAN Call if any problems.  Precautions discussed.  Continue current medications.   Return to clinic 3 months   Electronically Signed Sanjuana Kava, MD 5/23/201910:22 AM

## 2018-03-18 NOTE — Patient Instructions (Signed)
Steps to Quit Smoking Smoking tobacco can be bad for your health. It can also affect almost every organ in your body. Smoking puts you and people around you at risk for many serious long-lasting (chronic) diseases. Quitting smoking is hard, but it is one of the best things that you can do for your health. It is never too late to quit. What are the benefits of quitting smoking? When you quit smoking, you lower your risk for getting serious diseases and conditions. They can include:  Lung cancer or lung disease.  Heart disease.  Stroke.  Heart attack.  Not being able to have children (infertility).  Weak bones (osteoporosis) and broken bones (fractures).  If you have coughing, wheezing, and shortness of breath, those symptoms may get better when you quit. You may also get sick less often. If you are pregnant, quitting smoking can help to lower your chances of having a baby of low birth weight. What can I do to help me quit smoking? Talk with your doctor about what can help you quit smoking. Some things you can do (strategies) include:  Quitting smoking totally, instead of slowly cutting back how much you smoke over a period of time.  Going to in-person counseling. You are more likely to quit if you go to many counseling sessions.  Using resources and support systems, such as: ? Online chats with a counselor. ? Phone quitlines. ? Printed self-help materials. ? Support groups or group counseling. ? Text messaging programs. ? Mobile phone apps or applications.  Taking medicines. Some of these medicines may have nicotine in them. If you are pregnant or breastfeeding, do not take any medicines to quit smoking unless your doctor says it is okay. Talk with your doctor about counseling or other things that can help you.  Talk with your doctor about using more than one strategy at the same time, such as taking medicines while you are also going to in-person counseling. This can help make  quitting easier. What things can I do to make it easier to quit? Quitting smoking might feel very hard at first, but there is a lot that you can do to make it easier. Take these steps:  Talk to your family and friends. Ask them to support and encourage you.  Call phone quitlines, reach out to support groups, or work with a counselor.  Ask people who smoke to not smoke around you.  Avoid places that make you want (trigger) to smoke, such as: ? Bars. ? Parties. ? Smoke-break areas at work.  Spend time with people who do not smoke.  Lower the stress in your life. Stress can make you want to smoke. Try these things to help your stress: ? Getting regular exercise. ? Deep-breathing exercises. ? Yoga. ? Meditating. ? Doing a body scan. To do this, close your eyes, focus on one area of your body at a time from head to toe, and notice which parts of your body are tense. Try to relax the muscles in those areas.  Download or buy apps on your mobile phone or tablet that can help you stick to your quit plan. There are many free apps, such as QuitGuide from the CDC (Centers for Disease Control and Prevention). You can find more support from smokefree.gov and other websites.  This information is not intended to replace advice given to you by your health care provider. Make sure you discuss any questions you have with your health care provider. Document Released: 08/09/2009 Document   Revised: 06/10/2016 Document Reviewed: 02/27/2015 Elsevier Interactive Patient Education  2018 Elsevier Inc.  

## 2018-03-24 ENCOUNTER — Other Ambulatory Visit: Payer: Self-pay | Admitting: Orthopedic Surgery

## 2018-03-25 MED ORDER — HYDROCODONE-ACETAMINOPHEN 5-325 MG PO TABS: ORAL_TABLET | ORAL | 0 refills | Status: DC

## 2018-04-15 ENCOUNTER — Encounter: Payer: Self-pay | Admitting: Family Medicine

## 2018-04-15 ENCOUNTER — Ambulatory Visit (INDEPENDENT_AMBULATORY_CARE_PROVIDER_SITE_OTHER): Payer: Medicaid Other | Admitting: Family Medicine

## 2018-04-15 VITALS — BP 120/80 | HR 81 | Resp 16 | Ht 74.0 in | Wt 156.0 lb

## 2018-04-15 DIAGNOSIS — I10 Essential (primary) hypertension: Secondary | ICD-10-CM | POA: Diagnosis not present

## 2018-04-15 DIAGNOSIS — E559 Vitamin D deficiency, unspecified: Secondary | ICD-10-CM

## 2018-04-15 DIAGNOSIS — M1990 Unspecified osteoarthritis, unspecified site: Secondary | ICD-10-CM

## 2018-04-15 DIAGNOSIS — E785 Hyperlipidemia, unspecified: Secondary | ICD-10-CM | POA: Diagnosis not present

## 2018-04-15 DIAGNOSIS — J449 Chronic obstructive pulmonary disease, unspecified: Secondary | ICD-10-CM | POA: Diagnosis not present

## 2018-04-15 DIAGNOSIS — F17200 Nicotine dependence, unspecified, uncomplicated: Secondary | ICD-10-CM | POA: Diagnosis not present

## 2018-04-15 NOTE — Patient Instructions (Addendum)
Welcome to [employer] visit in August, call if  You need me before  Lipid, cmp and EGFr, Vit D and TSH   Need to quit smoking, I know that you will, now onl;y 3/ day  Congrats on retirement , all the best  Pneumonia visit next visit     Steps to Quit Smoking Smoking tobacco can be bad for your health. It can also affect almost every organ in your body. Smoking puts you and people around you at risk for many serious long-lasting (chronic) diseases. Quitting smoking is hard, but it is one of the best things that you can do for your health. It is never too late to quit. What are the benefits of quitting smoking? When you quit smoking, you lower your risk for getting serious diseases and conditions. They can include:  Lung cancer or lung disease.  Heart disease.  Stroke.  Heart attack.  Not being able to have children (infertility).  Weak bones (osteoporosis) and broken bones (fractures).  If you have coughing, wheezing, and shortness of breath, those symptoms may get better when you quit. You may also get sick less often. If you are pregnant, quitting smoking can help to lower your chances of having a baby of low birth weight. What can I do to help me quit smoking? Talk with your doctor about what can help you quit smoking. Some things you can do (strategies) include:  Quitting smoking totally, instead of slowly cutting back how much you smoke over a period of time.  Going to in-person counseling. You are more likely to quit if you go to many counseling sessions.  Using resources and support systems, such as: ? Database administrator with a Social worker. ? Phone quitlines. ? Careers information officer. ? Support groups or group counseling. ? Text messaging programs. ? Mobile phone apps or applications.  Taking medicines. Some of these medicines may have nicotine in them. If you are pregnant or breastfeeding, do not take any medicines to quit smoking unless your doctor says it is okay.  Talk with your doctor about counseling or other things that can help you.  Talk with your doctor about using more than one strategy at the same time, such as taking medicines while you are also going to in-person counseling. This can help make quitting easier. What things can I do to make it easier to quit? Quitting smoking might feel very hard at first, but there is a lot that you can do to make it easier. Take these steps:  Talk to your family and friends. Ask them to support and encourage you.  Call phone quitlines, reach out to support groups, or work with a Social worker.  Ask people who smoke to not smoke around you.  Avoid places that make you want (trigger) to smoke, such as: ? Bars. ? Parties. ? Smoke-break areas at work.  Spend time with people who do not smoke.  Lower the stress in your life. Stress can make you want to smoke. Try these things to help your stress: ? Getting regular exercise. ? Deep-breathing exercises. ? Yoga. ? Meditating. ? Doing a body scan. To do this, close your eyes, focus on one area of your body at a time from head to toe, and notice which parts of your body are tense. Try to relax the muscles in those areas.  Download or buy apps on your mobile phone or tablet that can help you stick to your quit plan. There are many free apps, such as  QuitGuide from the [employer] for Disease Control and Prevention). You can find more support from smokefree.gov and other websites.  This information is not intended to replace advice given to you by your health care provider. Make sure you discuss any questions you have with your health care provider. Document Released: 08/09/2009 Document Revised: 06/10/2016 Document Reviewed: 02/27/2015 Elsevier Interactive Patient Education  2018 [employer].

## 2018-04-16 ENCOUNTER — Other Ambulatory Visit: Payer: Self-pay | Admitting: Family Medicine

## 2018-04-16 ENCOUNTER — Encounter: Payer: Self-pay | Admitting: Family Medicine

## 2018-04-16 DIAGNOSIS — E785 Hyperlipidemia, unspecified: Secondary | ICD-10-CM | POA: Insufficient documentation

## 2018-04-16 NOTE — Assessment & Plan Note (Addendum)
Will improve when he stops smoking, he reports symptom improvement with reduction , he is to continue his currrent medication

## 2018-04-16 NOTE — Assessment & Plan Note (Signed)
Controlled, no change in medication DASH diet and commitment to daily physical activity for a minimum of 30 minutes discussed and encouraged, as a part of hypertension management. The importance of attaining a healthy weight is also discussed.  BP/Weight 04/15/2018 03/18/2018 02/17/2018 02/04/2018 01/20/2018 11/24/2017 9/43/7005  Systolic BP 259 102 890 228 406 986 148  Diastolic BP 80 84 85 85 88 82 83  Wt. (Lbs) 156 159 164 168 161 164 163  BMI 20.03 20.14 21.06 21.57 20.39 21.06 20.93

## 2018-04-16 NOTE — Assessment & Plan Note (Signed)
Pain management by orthopedics

## 2018-04-16 NOTE — Assessment & Plan Note (Signed)
Not at goal , needs to lower fat intake and increase dose of pravachol based on labs obtained after the visit, he will need to be notified and the change instituted Hyperlipidemia:Low fat diet discussed and encouraged.   Lipid Panel  Lab Results  Component Value Date   CHOL 190 04/15/2018   HDL 61 04/15/2018   LDLCALC 113 (H) 04/15/2018   TRIG 74 04/15/2018   CHOLHDL 3.1 04/15/2018

## 2018-04-16 NOTE — Assessment & Plan Note (Deleted)
Not at goal, needs to lower fat intake and increase medication dose

## 2018-04-16 NOTE — Progress Notes (Signed)
Dillon [name]     MRN: [id number]      DOB: Oct 07, 1951   HPI Dillon [name] is here for follow up and re-evaluation of chronic medical conditions, medication management and review of any available recent lab and radiology data.  Preventive health is updated, specifically  Cancer screening and Immunization.   Had stent placed I RLE a few months ago, down to 3 cigarettes daily and wants to quit. Feels well, has retired! The PT denies any adverse reactions to current medications since the last visit.  There are no new concerns.  There are no specific complaints   ROS Denies recent fever or chills. Denies sinus pressure, nasal congestion, ear pain or sore throat. Denies chest congestion, productive cough or wheezing. Denies chest pains, palpitations and leg swelling Denies abdominal pain, nausea, vomiting,diarrhea or constipation.   Denies dysuria, frequency, hesitancy or incontinence. C/o chronic  joint pain, back and knees and limitation in mobility. Denies headaches, seizures, numbness, or tingling. Denies depression, anxiety or insomnia. Denies skin break down or rash.   PE  BP 120/80   Pulse 81   Resp 16   Ht 6\' 2"  (1.88 m)   Wt 156 lb (70.8 kg)   SpO2 98%   BMI 20.03 kg/m   Patient alert and oriented and in no cardiopulmonary distress.  HEENT: No facial asymmetry, EOMI,   oropharynx pink and moist.  Neck supple no JVD, no mass.  Chest: Clear to auscultation bilaterally.Decreased though adequate air entrry  CVS: S1, S2 no murmurs, no S3.Regular rate.  ABD: Soft non tender.   Ext: No edema  MS: Adequate though reduced ROM spine, shoulders, hips and knees.  Skin: Intact, no ulcerations or rash noted.  Psych: Good eye contact, normal affect. Memory intact not anxious or depressed appearing.  CNS: CN 2-12 intact, power,  normal throughout.no focal deficits noted.   Assessment & Plan  Essential hypertension Controlled, no change in medication DASH diet and  commitment to daily physical activity for a minimum of 30 minutes discussed and encouraged, as a part of hypertension management. The importance of attaining a healthy weight is also discussed.  BP/Weight 04/15/2018 03/18/2018 02/17/2018 02/04/2018 01/20/2018 11/24/2017 3/78/5885  Systolic BP 027 741 287 867 672 094 709  Diastolic BP 80 84 85 85 88 82 83  Wt. (Lbs) 156 159 164 168 161 164 163  BMI 20.03 20.14 21.06 21.57 20.39 21.06 20.93       Hyperlipidemia LDL goal <70 Not at goal , needs to lower fat intake and increase dose of pravachol based on labs obtained after the visit, he will need to be notified and the change instituted Hyperlipidemia:Low fat diet discussed and encouraged.   Lipid Panel  Lab Results  Component Value Date   CHOL 190 04/15/2018   HDL 61 04/15/2018   LDLCALC 113 (H) 04/15/2018   TRIG 74 04/15/2018   CHOLHDL 3.1 04/15/2018       COPD (chronic obstructive pulmonary disease) Will improve when he stops smoking, he reports symptom improvement with reduction , he is to continue his currrent medication  NICOTINE ADDICTION Asked: confirms current smokes 3 cigarettes daily Assess: wants to quit and date set Assess:L needs to quit has CAD, and as had intervention for lowe ext arterial disease, also has COPD, will also reduce risk of all types of cancer esp lung Assist: counseled for 5 mins, literature and community resources provided also rx being sent for patches/gum Arrange f/u in 8 weeks  Generalized arthritis Pain management by orthopedics

## 2018-04-16 NOTE — Assessment & Plan Note (Signed)
Asked: confirms current smokes 3 cigarettes daily Assess: wants to quit and date set Assess:L needs to quit has CAD, and as had intervention for lowe ext arterial disease, also has COPD, will also reduce risk of all types of cancer esp lung Assist: counseled for 5 mins, literature and community resources provided also rx being sent for patches/gum Arrange f/u in 8 weeks

## 2018-04-20 ENCOUNTER — Other Ambulatory Visit: Payer: Self-pay | Admitting: Family Medicine

## 2018-04-21 MED ORDER — PRAVASTATIN SODIUM 80 MG PO TABS: 80.0000 mg | ORAL_TABLET | Freq: Every day | ORAL | 0 refills | Status: DC

## 2018-04-22 ENCOUNTER — Other Ambulatory Visit: Payer: Self-pay | Admitting: Orthopaedic Surgery

## 2018-04-22 MED ORDER — HYDROCODONE-ACETAMINOPHEN 5-325 MG PO TABS: ORAL_TABLET | ORAL | 0 refills | Status: DC

## 2018-05-07 ENCOUNTER — Encounter (HOSPITAL_COMMUNITY): Payer: Self-pay

## 2018-05-07 ENCOUNTER — Telehealth: Payer: Self-pay | Admitting: Acute Care

## 2018-05-07 ENCOUNTER — Ambulatory Visit (HOSPITAL_COMMUNITY)
Admission: RE | Admit: 2018-05-07 | Discharge: 2018-05-07 | Disposition: A | Discharge status: Home / Self Care | Payer: Medicaid Other | Source: Ambulatory Visit | Attending: Acute Care | Admitting: Acute Care

## 2018-05-07 DIAGNOSIS — F17210 Nicotine dependence, cigarettes, uncomplicated: Secondary | ICD-10-CM

## 2018-05-07 DIAGNOSIS — Z122 Encounter for screening for malignant neoplasm of respiratory organs: Secondary | ICD-10-CM

## 2018-05-07 NOTE — Telephone Encounter (Signed)
Will route message to Lung Nodule Pool as this is in regards to the pt's Lung Cancer Screening CT.

## 2018-05-11 ENCOUNTER — Other Ambulatory Visit: Payer: Self-pay | Admitting: Family Medicine

## 2018-05-11 NOTE — Telephone Encounter (Signed)
Spoke with Ebony Hail at [facility].  Advised that per pt's shared decision making visit in 2017 pt has a 36 pack year history.  She verbalized understanding and asked that I place a new order for LDCT so they can reschedule pt. New order placed.

## 2018-05-18 ENCOUNTER — Other Ambulatory Visit: Payer: Self-pay

## 2018-05-18 MED ORDER — NICOTINE POLACRILEX 2 MG MT GUM: CHEWING_GUM | OROMUCOSAL | 1 refills | Status: DC

## 2018-05-19 ENCOUNTER — Ambulatory Visit: Payer: Medicaid Other | Admitting: Vascular Surgery

## 2018-05-19 ENCOUNTER — Encounter (HOSPITAL_COMMUNITY): Payer: Medicaid Other

## 2018-05-26 ENCOUNTER — Other Ambulatory Visit: Payer: Self-pay | Admitting: Orthopaedic Surgery

## 2018-05-31 ENCOUNTER — Ambulatory Visit (HOSPITAL_COMMUNITY)
Admission: RE | Admit: 2018-05-31 | Discharge: 2018-05-31 | Disposition: A | Discharge status: Home / Self Care | Payer: Medicaid Other | Source: Ambulatory Visit | Attending: Acute Care | Admitting: Acute Care

## 2018-05-31 DIAGNOSIS — I700 Atherosclerosis of aorta: Secondary | ICD-10-CM | POA: Insufficient documentation

## 2018-05-31 DIAGNOSIS — J439 Emphysema, unspecified: Secondary | ICD-10-CM | POA: Diagnosis not present

## 2018-05-31 DIAGNOSIS — F17210 Nicotine dependence, cigarettes, uncomplicated: Secondary | ICD-10-CM

## 2018-05-31 DIAGNOSIS — J984 Other disorders of lung: Secondary | ICD-10-CM | POA: Insufficient documentation

## 2018-05-31 DIAGNOSIS — Z122 Encounter for screening for malignant neoplasm of respiratory organs: Secondary | ICD-10-CM

## 2018-05-31 MED ORDER — HYDROCODONE-ACETAMINOPHEN 5-325 MG PO TABS: ORAL_TABLET | ORAL | 0 refills | Status: DC

## 2018-06-09 ENCOUNTER — Ambulatory Visit (INDEPENDENT_AMBULATORY_CARE_PROVIDER_SITE_OTHER): Payer: Medicaid Other | Admitting: Family Medicine

## 2018-06-09 ENCOUNTER — Encounter: Payer: Self-pay | Admitting: Family Medicine

## 2018-06-09 VITALS — BP 118/80 | HR 83 | Resp 16 | Ht 74.0 in | Wt 155.8 lb

## 2018-06-09 DIAGNOSIS — I714 Abdominal aortic aneurysm, without rupture, unspecified: Secondary | ICD-10-CM

## 2018-06-09 DIAGNOSIS — Z125 Encounter for screening for malignant neoplasm of prostate: Secondary | ICD-10-CM | POA: Diagnosis not present

## 2018-06-09 DIAGNOSIS — J449 Chronic obstructive pulmonary disease, unspecified: Secondary | ICD-10-CM

## 2018-06-09 DIAGNOSIS — F17200 Nicotine dependence, unspecified, uncomplicated: Secondary | ICD-10-CM

## 2018-06-09 DIAGNOSIS — R972 Elevated prostate specific antigen [PSA]: Secondary | ICD-10-CM

## 2018-06-09 DIAGNOSIS — Z09 Encounter for follow-up examination after completed treatment for conditions other than malignant neoplasm: Secondary | ICD-10-CM | POA: Insufficient documentation

## 2018-06-09 DIAGNOSIS — Z0000 Encounter for general adult medical examination without abnormal findings: Secondary | ICD-10-CM | POA: Diagnosis not present

## 2018-06-09 DIAGNOSIS — J441 Chronic obstructive pulmonary disease with (acute) exacerbation: Secondary | ICD-10-CM

## 2018-06-09 LAB — POCT URINALYSIS DIPSTICK
Appearance: NORMAL
Bilirubin, UA: NEGATIVE
Glucose, UA: NEGATIVE
Ketones, UA: NEGATIVE
Leukocytes, UA: NEGATIVE
Nitrite, UA: NEGATIVE
Odor: NORMAL
Protein, UA: NEGATIVE
Spec Grav, UA: >=1.03 — AB (ref 1.010–1.025)
Urobilinogen, UA: 1 E.U./dL
pH, UA: 5.5 (ref 5.0–8.0)

## 2018-06-09 LAB — PSA: PSA: 7.1 ng/mL — ABNORMAL HIGH (ref <=4.0)

## 2018-06-09 MED ORDER — ALBUTEROL SULFATE (2.5 MG/3ML) 0.083% IN NEBU
2.5000 mg | INHALATION_SOLUTION | Freq: Once | RESPIRATORY_TRACT | Status: AC
  Administered 2018-06-09: 2.5 mg via RESPIRATORY_TRACT

## 2018-06-09 MED ORDER — IPRATROPIUM BROMIDE 0.02 % IN SOLN: 0.5000 mg | Freq: Four times a day (QID) | RESPIRATORY_TRACT | 12 refills | Status: DC | PRN

## 2018-06-09 MED ORDER — IPRATROPIUM BROMIDE 0.02 % IN SOLN
0.5000 mg | Freq: Once | RESPIRATORY_TRACT | Status: AC
  Administered 2018-06-09: 0.5 mg via RESPIRATORY_TRACT

## 2018-06-09 MED ORDER — NICOTINE POLACRILEX 2 MG MT GUM: CHEWING_GUM | OROMUCOSAL | 1 refills | Status: DC

## 2018-06-09 MED ORDER — LEVALBUTEROL HCL 0.63 MG/3ML IN NEBU: 0.6300 mg | INHALATION_SOLUTION | Freq: Three times a day (TID) | RESPIRATORY_TRACT | 12 refills | Status: DC | PRN

## 2018-06-09 MED ORDER — BUDESONIDE-FORMOTEROL FUMARATE 160-4.5 MCG/ACT IN AERO: 2.0000 | INHALATION_SPRAY | Freq: Two times a day (BID) | RESPIRATORY_TRACT | 5 refills | Status: DC

## 2018-06-09 MED ORDER — ALBUTEROL SULFATE HFA 108 (90 BASE) MCG/ACT IN AERS: 2.0000 | INHALATION_SPRAY | Freq: Four times a day (QID) | RESPIRATORY_TRACT | 3 refills | Status: DC | PRN

## 2018-06-09 MED ORDER — PREDNISONE 5 MG (21) PO TBPK: 5.0000 mg | ORAL_TABLET | ORAL | 0 refills | Status: DC

## 2018-06-09 NOTE — Patient Instructions (Addendum)
F/U in 3 months, call if you need me before   Breathing treatment and prednisone today, xopenex for breathing treatment  No more smoking after today and call 1800 QUITNOW for help    Steps to Quit Smoking Smoking tobacco can be bad for your health. It can also affect almost every organ in your body. Smoking puts you and people around you at risk for many serious long-lasting (chronic) diseases. Quitting smoking is hard, but it is one of the best things that you can do for your health. It is never too late to quit. What are the benefits of quitting smoking? When you quit smoking, you lower your risk for getting serious diseases and conditions. They can include:  Lung cancer or lung disease.  Heart disease.  Stroke.  Heart attack.  Not being able to have children (infertility).  Weak bones (osteoporosis) and broken bones (fractures).  If you have coughing, wheezing, and shortness of breath, those symptoms may get better when you quit. You may also get sick less often. If you are pregnant, quitting smoking can help to lower your chances of having a baby of low birth weight. What can I do to help me quit smoking? Talk with your doctor about what can help you quit smoking. Some things you can do (strategies) include:  Quitting smoking totally, instead of slowly cutting back how much you smoke over a period of time.  Going to in-person counseling. You are more likely to quit if you go to many counseling sessions.  Using resources and support systems, such as: ? Database administrator with a Social worker. ? Phone quitlines. ? Careers information officer. ? Support groups or group counseling. ? Text messaging programs. ? Mobile phone apps or applications.  Taking medicines. Some of these medicines may have nicotine in them. If you are pregnant or breastfeeding, do not take any medicines to quit smoking unless your doctor says it is okay. Talk with your doctor about counseling or other things  that can help you.  Talk with your doctor about using more than one strategy at the same time, such as taking medicines while you are also going to in-person counseling. This can help make quitting easier. What things can I do to make it easier to quit? Quitting smoking might feel very hard at first, but there is a lot that you can do to make it easier. Take these steps:  Talk to your family and friends. Ask them to support and encourage you.  Call phone quitlines, reach out to support groups, or work with a Social worker.  Ask people who smoke to not smoke around you.  Avoid places that make you want (trigger) to smoke, such as: ? Bars. ? Parties. ? Smoke-break areas at work.  Spend time with people who do not smoke.  Lower the stress in your life. Stress can make you want to smoke. Try these things to help your stress: ? Getting regular exercise. ? Deep-breathing exercises. ? Yoga. ? Meditating. ? Doing a body scan. To do this, close your eyes, focus on one area of your body at a time from head to toe, and notice which parts of your body are tense. Try to relax the muscles in those areas.  Download or buy apps on your mobile phone or tablet that can help you stick to your quit plan. There are many free apps, such as QuitGuide from the [employer] Office manager for Disease Control and Prevention). You can find more support from smokefree.gov and  other websites.  This information is not intended to replace advice given to you by your health care provider. Make sure you discuss any questions you have with your health care provider. Document Released: 08/09/2009 Document Revised: 06/10/2016 Document Reviewed: 02/27/2015 Elsevier Interactive Patient Education  2018 [employer].

## 2018-06-09 NOTE — Progress Notes (Signed)
Preventive Screening-Counseling & Management   Patient present here today for a Medicare annual wellness visit. 1 week h/o increased shortness of breath and cough, denies sputum, fever and chills, c/o excess palpitations and feeling  Weird" with duoneb, will change to xopenex and atrovent Abnormal PSA, lost to follow up Smokes 5 to 7 cigarettes / day wants to quit   Current Problems (verified)   Medications Prior to Visit Allergies (verified)   PAST HISTORY  Family History (verified)   Social History Lives with fiance of 18 years, no children, retired from [employer]. Current smoker , on average 5/ week   Risk Factors  Current exercise habits:  Walks and rides at least 3 days a week for at least 1;1.5 hours   Dietary issues discussed: heart healthy encourages, lots of fruits and vegetables and limit carb intake    Cardiac risk factors: CAD, HTN  Depression Screen  (Note: if answer to either of the following is "Yes", a more complete depression screening is indicated)   Over the past two weeks, have you felt down, depressed or hopeless? No  Over the past two weeks, have you felt little interest or pleasure in doing things? No  Have you lost interest or pleasure in daily life? No  Do you often feel hopeless? No  Do you cry easily over simple problems? No   Activities of Daily Living  In your present state of health, do you have any difficulty performing the following activities?  Driving?: No Managing money?: No Feeding yourself?:No Getting from bed to chair?:No Climbing a flight of stairs?:No Preparing food and eating?:No Bathing or showering?:No Getting dressed?:No Getting to the toilet?:No Using the toilet?:No Moving around from place to place?: No  Fall Risk Assessment In the past year have you fallen or had a near fall?:No Are you currently taking any medications that make you dizzy?:No   Hearing Difficulties: No Do you often ask people to speak  up or repeat themselves?:No Do you experience ringing or noises in your ears?:No Do you have difficulty understanding soft or whispered voices?:No  Cognitive Testing  Alert? Yes Normal Appearance?Yes  Oriented to person? Yes Place? Yes  Time? Yes  Displays appropriate judgment?Yes  Can read the correct time from a watch face? yes Are you having problems remembering things?No  Advanced Directives have been discussed with the patient?Yes, form given    List the Names of Other Physician/Practitioners you currently use:  Dr Luna Glasgow (ortho)  Dr Elie Confer (pulmonary) Dr Harl Bowie (cardiology)    Indicate any recent Medical Services you may have received from other than [facility] providers in the past year (date may be approximate).     Medicare Attestation  I have personally reviewed:  The patient's medical and social history  Their use of alcohol, tobacco or illicit drugs  Their current medications and supplements  The patient's functional ability including ADLs,fall risks, home safety risks, cognitive, and hearing and visual impairment  Diet and physical activities  Evidence for depression or mood disorders  The patient's weight, height, BMI, and visual acuity have been recorded in the chart. I have made referrals, counseling, and provided education to the patient based on review of the above and I have provided the patient with a written personalized care plan for preventive services.    Physical Exam BP 118/80   Pulse 83   Resp 16   Ht 6\' 2"  (1.88 m)   Wt 155 lb 12.8 oz (70.7 kg)   SpO2 99%  BMI 20.00 kg/m    Chest: decreased air entry with bilateral wheezes, no crackles   Assessment & Plan:  COPD (chronic obstructive pulmonary disease) Current flare, x 1 week  neb treatment and prednisone Change from albuterol to xopenex neb treatments due to s/e of palpitations  Welcome to Medicare preventive visit Annual exam as documented. Counseling done  re healthy lifestyle involving  commitment to 150 minutes exercise per week, heart healthy diet, and attaining healthy weight.The importance of adequate sleep also discussed. Regular seat belt use and home safety, is also discussed. Changes in health habits are decided on by the patient with goals and time frames  set for achieving them. Immunization and cancer screening needs are specifically addressed at this visit.   NICOTINE ADDICTION Asked: confirms currently smokes 3 to 5 cigarettes/ day Assess: Ready to quit Advise: needs to quit to reduce heart disease, stroke, cancer, has lung nodule which is being followed and has COPD and established vascular disease Assist: counseled for 5 minutes and has gum prescribed Arrange : f/u in 3 to 4 months  Elevated PSA Refer to urology for ongoing follow up

## 2018-06-09 NOTE — Assessment & Plan Note (Addendum)
Current flare, x 1 week  neb treatment and prednisone Change from albuterol to xopenex neb treatments due to s/e of palpitations

## 2018-06-10 DIAGNOSIS — I714 Abdominal aortic aneurysm, without rupture, unspecified: Secondary | ICD-10-CM | POA: Insufficient documentation

## 2018-06-10 NOTE — Assessment & Plan Note (Signed)
Refer to urology for ongoing follow up

## 2018-06-10 NOTE — Assessment & Plan Note (Addendum)
Asked: confirms currently smokes 3 to 5 cigarettes/ day Assess: Ready to quit Advise: needs to quit to reduce heart disease, stroke, cancer, has lung nodule which is being followed and has COPD and established vascular disease Assist: counseled for 5 minutes and has gum prescribed Arrange : f/u in 3 to 4 months

## 2018-06-10 NOTE — Assessment & Plan Note (Signed)

## 2018-06-11 ENCOUNTER — Other Ambulatory Visit: Payer: Self-pay | Admitting: Acute Care

## 2018-06-11 DIAGNOSIS — Z122 Encounter for screening for malignant neoplasm of respiratory organs: Secondary | ICD-10-CM

## 2018-06-11 DIAGNOSIS — F17210 Nicotine dependence, cigarettes, uncomplicated: Secondary | ICD-10-CM

## 2018-06-17 ENCOUNTER — Ambulatory Visit (INDEPENDENT_AMBULATORY_CARE_PROVIDER_SITE_OTHER): Payer: Medicaid Other | Admitting: Orthopaedic Surgery

## 2018-06-17 ENCOUNTER — Encounter: Payer: Self-pay | Admitting: Orthopaedic Surgery

## 2018-06-17 VITALS — BP 124/76 | HR 81 | Ht 74.5 in | Wt 158.0 lb

## 2018-06-17 DIAGNOSIS — M25512 Pain in left shoulder: Secondary | ICD-10-CM

## 2018-06-17 DIAGNOSIS — F17210 Nicotine dependence, cigarettes, uncomplicated: Secondary | ICD-10-CM

## 2018-06-17 DIAGNOSIS — G8929 Other chronic pain: Secondary | ICD-10-CM

## 2018-06-17 DIAGNOSIS — I70211 Atherosclerosis of native arteries of extremities with intermittent claudication, right leg: Secondary | ICD-10-CM

## 2018-06-17 MED ORDER — CYCLOBENZAPRINE HCL 10 MG PO TABS: 10.0000 mg | ORAL_TABLET | Freq: Every day | ORAL | 2 refills | Status: DC

## 2018-06-17 NOTE — Progress Notes (Signed)
[name], male DOB:[birth year], 67 y.o. JSE:[id number]  Chief Complaint  Patient presents with  . Shoulder Pain    left    HPI  Dillon [name] is a 67 y.o. male who has chronic left shoulder pain. He has pain with overhead use and rolling over on it at night.  He has no new trauma, no paresthesias.  He has no redness.  He is trying to do exercises for it.  He is taking his medicine.   Body mass index is 20.01 kg/m.  ROS  Review of Systems  Respiratory: Positive for shortness of breath. Negative for cough.   Cardiovascular: Negative for chest pain and leg swelling.  Endocrine: Negative for cold intolerance.  Musculoskeletal: Positive for arthralgias and myalgias.  Allergic/Immunologic: Positive for environmental allergies.  All other systems reviewed and are negative.   All other systems reviewed and are negative.  The following is a summary of the past history medically, past history surgically, known current medicines, social history and family history.  This information is gathered electronically by the computer from prior information and documentation.  I review this each visit and have found including this information at this point in the chart is beneficial and informative.    Past Medical History:  Diagnosis Date  . Arthritis    2012  . Asthma    childhood  . COPD (chronic obstructive pulmonary disease) ([place])   . Hyperlipidemia    1990  . Hypertension    1990  . Nicotine addiction   . PAD (peripheral artery disease) ([place]) 2015    Past Surgical History:  Procedure Laterality Date  . KNEE ARTHROSCOPY     both knees   . LOWER EXTREMITY ANGIOGRAPHY N/A 02/04/2018   Procedure: LOWER EXTREMITY ANGIOGRAPHY;  Surgeon: Conrad [place], MD;  Location: [facility];  Service: Cardiovascular;  Laterality: N/A;    Family History  Problem Relation Age of Onset  . Heart attack Mother   . Diabetes Mother   . Hypertension Mother   . Stroke Brother   .  Hypertension Sister   . Hypertension Sister   . Hypertension Sister   . Hypertension Brother     Social History Social History   Tobacco Use  . Smoking status: Current Some Day Smoker    Packs/day: 0.75    Years: 48.00    Pack years: 36.00    Types: Cigarettes    Start date: 10/16/1969  . Smokeless tobacco: Never Used  . Tobacco comment: 1 pack lasts approx 1 week  Substance Use Topics  . Alcohol use: Yes    Alcohol/week: 0.0 standard drinks    Comment: occassional , max of 72 oz pwr month  . Drug use: No    Allergies  Allergen Reactions  . Penicillins Hives and Other (See Comments)    Has patient had a PCN reaction causing immediate rash, facial/tongue/throat swelling, SOB or lightheadedness with hypotension: Unknown Has patient had a PCN reaction causing severe rash involving mucus membranes or skin necrosis: No Has patient had a PCN reaction that required hospitalization: Yes Has patient had a PCN reaction occurring within the last 10 years: No If all of the above answers are "NO", then may proceed with Cephalosporin use.     Current Outpatient Medications  Medication Sig Dispense Refill  . albuterol (PROVENTIL HFA;VENTOLIN HFA) 108 (90 Base) MCG/ACT inhaler Inhale 2 puffs into the lungs every 6 (six) hours as needed for wheezing. 1 Inhaler 3  .  amLODipine (NORVASC) 10 MG tablet TAKE 1 TABLET BY MOUTH EVERY DAY 90 tablet 0  . aspirin EC 81 MG tablet Take 81 mg by mouth daily.    . budesonide-formoterol (SYMBICORT) 160-4.5 MCG/ACT inhaler Inhale 2 puffs into the lungs 2 (two) times daily. 10.2 Inhaler 5  . cilostazol (PLETAL) 50 MG tablet Take 1 tablet (50 mg total) by mouth 2 (two) times daily. 60 tablet 6  . cyclobenzaprine (FLEXERIL) 10 MG tablet Take 1 tablet (10 mg total) by mouth at bedtime. 30 tablet 1  . ergocalciferol (VITAMIN D2) 50000 units capsule Take 1 capsule (50,000 Units total) by mouth once a week. One capsule once weekly (Patient taking differently:  Take 50,000 Units by mouth every Tuesday. ) 12 capsule 3  . gabapentin (NEURONTIN) 300 MG capsule TAKE 1 CAPSULE (300 MG TOTAL) BY MOUTH AT BEDTIME. 30 capsule 2  . HYDROcodone-acetaminophen (NORCO/VICODIN) 5-325 MG tablet One tablet every six hours as needed for pain.  30 day limit. 100 tablet 0  . ipratropium (ATROVENT) 0.02 % nebulizer solution Take 2.5 mLs (0.5 mg total) by nebulization every 6 (six) hours as needed for wheezing or shortness of breath. 75 mL 12  . levalbuterol (XOPENEX) 0.63 MG/3ML nebulizer solution Take 3 mLs (0.63 mg total) by nebulization every 8 (eight) hours as needed for wheezing or shortness of breath. 3 mL 12  . meloxicam (MOBIC) 15 MG tablet TAKE 1 TABLET ONCE DAILY AS NEEDED FOR UNCONTROLLED ARTHRITIS PAIN 90 tablet 0  . nicotine polacrilex (NICORELIEF) 2 MG gum Week 1-6 Chew one piece every 1-2 hours prn for cravings. Week 7-9- One piece every 2-4 hours prn. Week 10-12-  One piece every 4-6 hrs prn. 100 tablet 1  . pravastatin (PRAVACHOL) 80 MG tablet Take 1 tablet (80 mg total) by mouth daily. 90 tablet 0  . spironolactone (ALDACTONE) 50 MG tablet TAKE 1 TABLET BY MOUTH EVERY DAY (Patient taking differently: TAKE 50 MG BY MOUTH EVERY DAY) 90 tablet 1  . cyclobenzaprine (FLEXERIL) 10 MG tablet Take 1 tablet (10 mg total) by mouth at bedtime. One tablet every night at bedtime as needed for spasm. 30 tablet 2   No current facility-administered medications for this visit.      Physical Exam  Blood pressure 124/76, pulse 81, height 6' 2.5" (1.892 m), weight 158 lb (71.7 kg).  Constitutional: overall normal hygiene, normal nutrition, well developed, normal grooming, normal body habitus. Assistive device:none  Musculoskeletal: gait and station Limp none, muscle tone and strength are normal, no tremors or atrophy is present.  .  Neurological: coordination overall normal.  Deep tendon reflex/nerve stretch intact.  Sensation normal.  Cranial nerves II-XII intact.    Skin:   Normal overall no scars, lesions, ulcers or rashes. No psoriasis.  Psychiatric: Alert and oriented x 3.  Recent memory intact, remote memory unclear.  Normal mood and affect. Well groomed.  Good eye contact.  Cardiovascular: overall no swelling, no varicosities, no edema bilaterally, normal temperatures of the legs and arms, no clubbing, cyanosis and good capillary refill.  Lymphatic: palpation is normal.  Examination of left Upper Extremity is done.  Inspection:   Overall:  Elbow non-tender without crepitus or defects, forearm non-tender without crepitus or defects, wrist non-tender without crepitus or defects, hand non-tender.    Shoulder: with glenohumeral joint tenderness, without effusion.   Upper arm: without swelling and tenderness   Range of motion:   Overall:  Full range of motion of the elbow, full range of  motion of wrist and full range of motion in fingers.   Shoulder:  left  180 degrees forward flexion; 165 degrees abduction; 40 degrees internal rotation, 40 degrees external rotation, 20 degrees extension, 40 degrees adduction.   Stability:   Overall:  Shoulder, elbow and wrist stable   Strength and Tone:   Overall full shoulder muscles strength, full upper arm strength and normal upper arm bulk and tone. All other systems reviewed and are negative   The patient has been educated about the nature of the problem(s) and counseled on treatment options.  The patient appeared to understand what I have discussed and is in agreement with it.  Encounter Diagnoses  Name Primary?  . Chronic left shoulder pain Yes  . Cigarette nicotine dependence without complication     PLAN Call if any problems.  Precautions discussed.  Continue current medications.   Return to clinic 3 months   Electronically Signed Sanjuana Kava, MD 8/22/201911:02 AM

## 2018-06-17 NOTE — Patient Instructions (Signed)
Steps to Quit Smoking Smoking tobacco can be bad for your health. It can also affect almost every organ in your body. Smoking puts you and people around you at risk for many serious long-lasting (chronic) diseases. Quitting smoking is hard, but it is one of the best things that you can do for your health. It is never too late to quit. What are the benefits of quitting smoking? When you quit smoking, you lower your risk for getting serious diseases and conditions. They can include:  Lung cancer or lung disease.  Heart disease.  Stroke.  Heart attack.  Not being able to have children (infertility).  Weak bones (osteoporosis) and broken bones (fractures).  If you have coughing, wheezing, and shortness of breath, those symptoms may get better when you quit. You may also get sick less often. If you are pregnant, quitting smoking can help to lower your chances of having a baby of low birth weight. What can I do to help me quit smoking? Talk with your doctor about what can help you quit smoking. Some things you can do (strategies) include:  Quitting smoking totally, instead of slowly cutting back how much you smoke over a period of time.  Going to in-person counseling. You are more likely to quit if you go to many counseling sessions.  Using resources and support systems, such as: ? Online chats with a counselor. ? Phone quitlines. ? Printed self-help materials. ? Support groups or group counseling. ? Text messaging programs. ? Mobile phone apps or applications.  Taking medicines. Some of these medicines may have nicotine in them. If you are pregnant or breastfeeding, do not take any medicines to quit smoking unless your doctor says it is okay. Talk with your doctor about counseling or other things that can help you.  Talk with your doctor about using more than one strategy at the same time, such as taking medicines while you are also going to in-person counseling. This can help make  quitting easier. What things can I do to make it easier to quit? Quitting smoking might feel very hard at first, but there is a lot that you can do to make it easier. Take these steps:  Talk to your family and friends. Ask them to support and encourage you.  Call phone quitlines, reach out to support groups, or work with a counselor.  Ask people who smoke to not smoke around you.  Avoid places that make you want (trigger) to smoke, such as: ? Bars. ? Parties. ? Smoke-break areas at work.  Spend time with people who do not smoke.  Lower the stress in your life. Stress can make you want to smoke. Try these things to help your stress: ? Getting regular exercise. ? Deep-breathing exercises. ? Yoga. ? Meditating. ? Doing a body scan. To do this, close your eyes, focus on one area of your body at a time from head to toe, and notice which parts of your body are tense. Try to relax the muscles in those areas.  Download or buy apps on your mobile phone or tablet that can help you stick to your quit plan. There are many free apps, such as QuitGuide from the CDC (Centers for Disease Control and Prevention). You can find more support from smokefree.gov and other websites.  This information is not intended to replace advice given to you by your health care provider. Make sure you discuss any questions you have with your health care provider. Document Released: 08/09/2009 Document   Revised: 06/10/2016 Document Reviewed: 02/27/2015 Elsevier Interactive Patient Education  2018 Elsevier Inc.  

## 2018-06-29 ENCOUNTER — Telehealth: Payer: Self-pay | Admitting: Orthopaedic Surgery

## 2018-06-29 MED ORDER — HYDROCODONE-ACETAMINOPHEN 5-325 MG PO TABS: ORAL_TABLET | ORAL | 0 refills | Status: DC

## 2018-06-29 NOTE — Telephone Encounter (Signed)
Patient requests refill on Hydrocodone/Acetaminophen 5-325 mgs.   Qty  100       Sig: One tablet every six hours as needed for pain. 30 day limit.   Patient states he uses [facility] in [place]

## 2018-07-13 ENCOUNTER — Other Ambulatory Visit: Payer: Self-pay | Admitting: Family Medicine

## 2018-07-18 ENCOUNTER — Other Ambulatory Visit: Payer: Self-pay | Admitting: Family Medicine

## 2018-07-26 ENCOUNTER — Other Ambulatory Visit: Payer: Self-pay

## 2018-07-27 ENCOUNTER — Ambulatory Visit: Payer: Medicaid Other | Admitting: Urology

## 2018-07-27 DIAGNOSIS — R972 Elevated prostate specific antigen [PSA]: Secondary | ICD-10-CM | POA: Diagnosis not present

## 2018-07-27 LAB — COMPLETE METABOLIC PANEL WITH GFR
AG Ratio: 1.3 (calc) (ref 1.0–2.5)
ALT: 9 U/L (ref 9–46)
AST: 14 U/L (ref 10–35)
Albumin: 4.3 g/dL (ref 3.6–5.1)
Alkaline phosphatase (APISO): 79 U/L (ref 40–115)
BUN: 13 mg/dL (ref 7–25)
CO2: 32 mmol/L (ref 20–32)
Calcium: 9.7 mg/dL (ref 8.6–10.3)
Chloride: 100 mmol/L (ref 98–110)
Creat: 1 mg/dL (ref 0.70–1.25)
GFR, Est African American: 91 mL/min/{1.73_m2} (ref >=60)
GFR, Est Non African American: 79 mL/min/{1.73_m2} (ref >=60)
Globulin: 3.2 g/dL (calc) (ref 1.9–3.7)
Glucose, Bld: 88 mg/dL (ref 65–139)
Potassium: 4.2 mmol/L (ref 3.5–5.3)
Sodium: 139 mmol/L (ref 135–146)
Total Bilirubin: 0.7 mg/dL (ref 0.2–1.2)
Total Protein: 7.5 g/dL (ref 6.1–8.1)

## 2018-07-27 LAB — LIPID PANEL
Cholesterol: 190 mg/dL (ref <200)
HDL: 61 mg/dL (ref >40)
LDL Cholesterol (Calc): 113 mg/dL (calc) — ABNORMAL HIGH
Non-HDL Cholesterol (Calc): 129 mg/dL (calc) (ref <130)
Total CHOL/HDL Ratio: 3.1 (calc) (ref <5.0)
Triglycerides: 74 mg/dL (ref <150)

## 2018-07-27 LAB — TSH: TSH: 1.57 mIU/L (ref 0.40–4.50)

## 2018-07-27 LAB — VITAMIN D 25 HYDROXY (VIT D DEFICIENCY, FRACTURES): Vit D, 25-Hydroxy: 43 ng/mL (ref 30–100)

## 2018-07-27 MED ORDER — HYDROCODONE-ACETAMINOPHEN 5-325 MG PO TABS: ORAL_TABLET | ORAL | 0 refills | Status: DC

## 2018-07-28 ENCOUNTER — Encounter (HOSPITAL_COMMUNITY): Payer: Medicaid Other

## 2018-07-28 ENCOUNTER — Ambulatory Visit: Payer: Medicaid Other | Admitting: Vascular Surgery

## 2018-08-14 ENCOUNTER — Other Ambulatory Visit: Payer: Self-pay | Admitting: Family Medicine

## 2018-08-14 DIAGNOSIS — I10 Essential (primary) hypertension: Secondary | ICD-10-CM

## 2018-08-20 ENCOUNTER — Other Ambulatory Visit: Payer: Self-pay | Admitting: Family Medicine

## 2018-08-24 ENCOUNTER — Other Ambulatory Visit: Payer: Self-pay

## 2018-08-25 MED ORDER — HYDROCODONE-ACETAMINOPHEN 5-325 MG PO TABS: ORAL_TABLET | ORAL | 0 refills | Status: DC

## 2018-08-30 ENCOUNTER — Other Ambulatory Visit: Payer: Self-pay

## 2018-08-30 ENCOUNTER — Telehealth: Payer: Self-pay | Admitting: Family Medicine

## 2018-08-30 DIAGNOSIS — J449 Chronic obstructive pulmonary disease, unspecified: Secondary | ICD-10-CM

## 2018-08-30 MED ORDER — ALBUTEROL SULFATE HFA 108 (90 BASE) MCG/ACT IN AERS: 2.0000 | INHALATION_SPRAY | Freq: Four times a day (QID) | RESPIRATORY_TRACT | 3 refills | Status: DC | PRN

## 2018-08-30 NOTE — Telephone Encounter (Signed)
Pt needs albuterol inhalor called in

## 2018-08-30 NOTE — Telephone Encounter (Signed)
Called patient and left a message to notify him of inhaler called in

## 2018-08-31 ENCOUNTER — Ambulatory Visit: Payer: Self-pay | Admitting: Vascular Surgery

## 2018-08-31 ENCOUNTER — Encounter (HOSPITAL_COMMUNITY): Payer: Medicaid Other

## 2018-09-07 ENCOUNTER — Other Ambulatory Visit: Payer: Self-pay

## 2018-09-07 ENCOUNTER — Encounter: Payer: Self-pay | Admitting: Vascular Surgery

## 2018-09-07 ENCOUNTER — Ambulatory Visit (INDEPENDENT_AMBULATORY_CARE_PROVIDER_SITE_OTHER): Payer: Medicaid Other | Admitting: Vascular Surgery

## 2018-09-07 ENCOUNTER — Ambulatory Visit (HOSPITAL_COMMUNITY)
Admission: RE | Admit: 2018-09-07 | Discharge: 2018-09-07 | Disposition: A | Discharge status: Home / Self Care | Payer: Medicare Other | Source: Ambulatory Visit | Attending: Vascular Surgery | Admitting: Vascular Surgery

## 2018-09-07 VITALS — BP 144/87 | HR 93 | Resp 18 | Ht 74.5 in | Wt 159.5 lb

## 2018-09-07 DIAGNOSIS — I70211 Atherosclerosis of native arteries of extremities with intermittent claudication, right leg: Secondary | ICD-10-CM | POA: Diagnosis not present

## 2018-09-07 NOTE — Progress Notes (Signed)
Patient name: Dillon [name] MRN: [id number] DOB: 1950/12/30 Sex: male  REASON FOR VISIT: 63-month follow-up for bilateral lower extremity claudication  HPI: Dillon [name] is a 67 y.o. male with multiple medical problems including PAD, hypertension, hyperlipidemia, COPD that presents for interval six-month follow-up for ongoing surveillance of his claudication.  He was previously a patient of Dr. Lianne Moris and most recently underwent right lower extremity arteriogram in April 2019.  Ultimately he was found to have a right SFA occlusion as well as a popliteal occlusion and would need a right fem to tibioperoneal trunk bypass but had no usable vein.  He was placed on Pletal and conservative measures by Dr. Bridgett Larsson.  On follow-up today patient states he is doing really well.  He actually denies claudication in either leg at this time.  States since starting the Pletal he is able to ride his bike and walk without any significant pain or problems in his calf or feet.  Unfortunately he is still using tobacco.  No tissue loss.   Past Medical History:  Diagnosis Date  . Arthritis    2012  . Asthma    childhood  . COPD (chronic obstructive pulmonary disease) ([place])   . Hyperlipidemia    1990  . Hypertension    1990  . Nicotine addiction   . PAD (peripheral artery disease) ([place]) 2015    Past Surgical History:  Procedure Laterality Date  . KNEE ARTHROSCOPY     both knees   . LOWER EXTREMITY ANGIOGRAPHY N/A 02/04/2018   Procedure: LOWER EXTREMITY ANGIOGRAPHY;  Surgeon: Conrad , MD;  Location: [facility];  Service: Cardiovascular;  Laterality: N/A;    Family History  Problem Relation Age of Onset  . Heart attack Mother   . Diabetes Mother   . Hypertension Mother   . Stroke Brother   . Hypertension Sister   . Hypertension Sister   . Hypertension Sister   . Hypertension Brother     SOCIAL HISTORY: Social History   Tobacco Use  . Smoking status: Current Some Day Smoker   Packs/day: 0.75    Years: 48.00    Pack years: 36.00    Types: Cigarettes    Start date: 10/16/1969  . Smokeless tobacco: Never Used  . Tobacco comment: 1 pack lasts approx 1 week  Substance Use Topics  . Alcohol use: Yes    Alcohol/week: 0.0 standard drinks    Comment: occassional , max of 72 oz pwr month    Allergies  Allergen Reactions  . Penicillins Hives and Other (See Comments)    Has patient had a PCN reaction causing immediate rash, facial/tongue/throat swelling, SOB or lightheadedness with hypotension: Unknown Has patient had a PCN reaction causing severe rash involving mucus membranes or skin necrosis: No Has patient had a PCN reaction that required hospitalization: Yes Has patient had a PCN reaction occurring within the last 10 years: No If all of the above answers are "NO", then may proceed with Cephalosporin use.     Current Outpatient Medications  Medication Sig Dispense Refill  . albuterol (PROVENTIL HFA;VENTOLIN HFA) 108 (90 Base) MCG/ACT inhaler Inhale 2 puffs into the lungs every 6 (six) hours as needed for wheezing. 1 Inhaler 3  . amLODipine (NORVASC) 10 MG tablet TAKE 1 TABLET BY MOUTH EVERY DAY 90 tablet 1  . aspirin EC 81 MG tablet Take 81 mg by mouth daily.    . budesonide-formoterol (SYMBICORT) 160-4.5 MCG/ACT inhaler Inhale 2 puffs into the  lungs 2 (two) times daily. 10.2 Inhaler 5  . cilostazol (PLETAL) 50 MG tablet Take 1 tablet (50 mg total) by mouth 2 (two) times daily. 60 tablet 6  . cyclobenzaprine (FLEXERIL) 10 MG tablet Take 1 tablet (10 mg total) by mouth at bedtime. 30 tablet 1  . cyclobenzaprine (FLEXERIL) 10 MG tablet Take 1 tablet (10 mg total) by mouth at bedtime. One tablet every night at bedtime as needed for spasm. 30 tablet 2  . ergocalciferol (VITAMIN D2) 50000 units capsule Take 1 capsule (50,000 Units total) by mouth once a week. One capsule once weekly (Patient taking differently: Take 50,000 Units by mouth every Tuesday. ) 12 capsule 3    . gabapentin (NEURONTIN) 300 MG capsule TAKE 1 CAPSULE (300 MG TOTAL) BY MOUTH AT BEDTIME. 30 capsule 2  . HYDROcodone-acetaminophen (NORCO/VICODIN) 5-325 MG tablet One tablet every six hours as needed for pain.  30 day limit. 100 tablet 0  . ipratropium (ATROVENT) 0.02 % nebulizer solution Take 2.5 mLs (0.5 mg total) by nebulization every 6 (six) hours as needed for wheezing or shortness of breath. 75 mL 12  . levalbuterol (XOPENEX) 0.63 MG/3ML nebulizer solution Take 3 mLs (0.63 mg total) by nebulization every 8 (eight) hours as needed for wheezing or shortness of breath. 3 mL 12  . meloxicam (MOBIC) 15 MG tablet TAKE 1 TABLET BY MOUTH ONCE DAILY AS NEEDED FOR UNCONTROLLED ARTHRITIS PAIN 90 tablet 0  . nicotine polacrilex (NICORELIEF) 2 MG gum Week 1-6 Chew one piece every 1-2 hours prn for cravings. Week 7-9- One piece every 2-4 hours prn. Week 10-12-  One piece every 4-6 hrs prn. 100 tablet 1  . pravastatin (PRAVACHOL) 80 MG tablet TAKE 1 TABLET BY MOUTH EVERY DAY 90 tablet 0  . spironolactone (ALDACTONE) 50 MG tablet TAKE 1 TABLET BY MOUTH EVERY DAY 90 tablet 1   No current facility-administered medications for this visit.     REVIEW OF SYSTEMS:  [X]  denotes positive finding, [ ]  denotes negative finding Cardiac  Comments:  Chest pain or chest pressure:    Shortness of breath upon exertion:    Short of breath when lying flat:    Irregular heart rhythm:        Vascular    Pain in calf, thigh, or hip brought on by ambulation:    Pain in feet at night that wakes you up from your sleep:     Blood clot in your veins:    Leg swelling:         Pulmonary    Oxygen at home:    Productive cough:     Wheezing:         Neurologic    Sudden weakness in arms or legs:     Sudden numbness in arms or legs:     Sudden onset of difficulty speaking or slurred speech:    Temporary loss of vision in one eye:     Problems with dizziness:         Gastrointestinal    Blood in stool:     Vomited  blood:         Genitourinary    Burning when urinating:     Blood in urine:        Psychiatric    Major depression:         Hematologic    Bleeding problems:    Problems with blood clotting too easily:        Skin  Rashes or ulcers:        Constitutional    Fever or chills:      PHYSICAL EXAM: Vitals:   09/07/18 0853  BP: (!) 144/87  Pulse: 93  Resp: 18  SpO2: 96%  Weight: 72.3 kg  Height: 6' 2.5" (1.892 m)    GENERAL: The patient is a well-nourished male, in no acute distress. The vital signs are documented above. CARDIAC: There is a regular rate and rhythm.  VASCULAR:  2+ palpable radial pulse bilatearal upper extremities 2+ palpable femoral pulse bilateral groins Biphasic R PT signal, triphasic L PT signal, no wounds. PULMONARY: There is good air exchange bilaterally without wheezing or rales. ABDOMEN: Soft and non-tender with normal pitched bowel sounds.  MUSCULOSKELETAL: There are no major deformities or cyanosis. NEUROLOGIC: No focal weakness or paresthesias are detected. SKIN: There are no ulcers or rashes noted. PSYCHIATRIC: The patient has a normal affect.  DATA:    I independently reviewed his noninvasive imaging and he has an ABI of 1.07 with a triphasic waveform in the left lower extremity and an ABI of 0.63 in the right lower extremity with a biphasic waveform at the ankle.  Assessment/Plan:  67 year old male that was previous under surveillance by Dr. Bridgett Larsson for bilateral lower extremity claudication.  On follow-up today since starting Pletal he states he has no claudication symptoms and is doing very well overall.  Do not think he needs any surgical intervention at this time.  Talked with him about smoking cessation and continuing his Pletal for now.  I offered to see him back in 1 year with repeat ABIs.  Also of note he has a 3.6 cm abdominal aortic aneurysm and 1.7 cm bilateral common iliac artery aneurysms.    Marty Heck, MD [facility] Office: [phone] Pager: [place]

## 2018-09-09 ENCOUNTER — Ambulatory Visit (INDEPENDENT_AMBULATORY_CARE_PROVIDER_SITE_OTHER): Payer: Medicaid Other | Admitting: Family Medicine

## 2018-09-09 ENCOUNTER — Encounter: Payer: Self-pay | Admitting: Family Medicine

## 2018-09-09 VITALS — BP 128/78 | HR 94 | Resp 15 | Ht 75.0 in | Wt 160.0 lb

## 2018-09-09 DIAGNOSIS — I10 Essential (primary) hypertension: Secondary | ICD-10-CM | POA: Diagnosis not present

## 2018-09-09 DIAGNOSIS — F17200 Nicotine dependence, unspecified, uncomplicated: Secondary | ICD-10-CM

## 2018-09-09 DIAGNOSIS — Z1211 Encounter for screening for malignant neoplasm of colon: Secondary | ICD-10-CM | POA: Diagnosis not present

## 2018-09-09 DIAGNOSIS — J209 Acute bronchitis, unspecified: Secondary | ICD-10-CM

## 2018-09-09 DIAGNOSIS — J440 Chronic obstructive pulmonary disease with acute lower respiratory infection: Secondary | ICD-10-CM

## 2018-09-09 DIAGNOSIS — E785 Hyperlipidemia, unspecified: Secondary | ICD-10-CM

## 2018-09-09 MED ORDER — SULFAMETHOXAZOLE-TRIMETHOPRIM 800-160 MG PO TABS: 1.0000 | ORAL_TABLET | Freq: Two times a day (BID) | ORAL | 0 refills | Status: DC

## 2018-09-09 MED ORDER — PREDNISONE 5 MG (21) PO TBPK: 5.0000 mg | ORAL_TABLET | ORAL | 0 refills | Status: DC

## 2018-09-09 MED ORDER — METHYLPREDNISOLONE ACETATE 80 MG/ML IJ SUSP
80.0000 mg | Freq: Once | INTRAMUSCULAR | Status: AC
  Administered 2018-09-09: 80 mg via INTRAMUSCULAR

## 2018-09-09 MED ORDER — BENZONATATE 100 MG PO CAPS: 100.0000 mg | ORAL_CAPSULE | Freq: Two times a day (BID) | ORAL | 0 refills | Status: DC | PRN

## 2018-09-09 MED ORDER — ALBUTEROL SULFATE (2.5 MG/3ML) 0.083% IN NEBU
2.5000 mg | INHALATION_SOLUTION | Freq: Once | RESPIRATORY_TRACT | Status: AC
  Administered 2018-09-09: 2.5 mg via RESPIRATORY_TRACT

## 2018-09-09 MED ORDER — IPRATROPIUM BROMIDE 0.02 % IN SOLN
0.5000 mg | Freq: Once | RESPIRATORY_TRACT | Status: AC
  Administered 2018-09-09: 0.5 mg via RESPIRATORY_TRACT

## 2018-09-09 NOTE — Progress Notes (Signed)
   Dillon [name]     MRN: [id number]      DOB: 1951/01/28   HPI Dillon [name] is here  With a 2 day h/o chest tightness and cough productive of thick yellow sputum, no fever or chiolls ROS Denies recent fever or chills. Denies sinus pressure, nasal congestion, ear pain or sore throat. Denies chest congestion, productive cough or wheezing. Denies chest pains, palpitations and leg swelling Denies abdominal pain, nausea, vomiting,diarrhea or constipation.   Denies dysuria, frequency, hesitancy or incontinence. Denies uncontrolled  joint pain, swelling and limitation in mobility. Denies headaches, seizures, numbness, or tingling. Denies depression, anxiety or insomnia. Denies skin break down or rash.   PE  BP 128/78   Pulse 94   Resp 15   Ht 6\' 3"  (1.905 m)   Wt 160 lb (72.6 kg)   SpO2 98%   BMI 20.00 kg/m   Patient alert and oriented and in no cardiopulmonary distress.  HEENT: No facial asymmetry, EOMI,   oropharynx pink and moist.  Neck supple no JVD, no mass.  Chest: decreased air entry with bilateral wheezing  CVS: S1, S2 no murmurs, no S3.Regular rate.  ABD: Soft non tender.   Ext: No edema  MS: Adequate though reduced ROM spine, shoulders, hips and knees.  Skin: Intact, no ulcerations or rash noted.  Psych: Good eye contact, normal affect. Memory intact not anxious or depressed appearing.  CNS: CN 2-12 intact, power,  normal throughout.no focal deficits noted.   Assessment & Plan  Acute bronchitis with COPD (HCC) Neb x 1 , depo medrol 80 mg iM , pred dose pack , antibitoc and decongestant  Essential hypertension Controlled, no change in medication DASH diet and commitment to daily physical activity for a minimum of 30 minutes discussed and encouraged, as a part of hypertension management. The importance of attaining a healthy weight is also discussed.  BP/Weight 09/16/2018 09/09/2018 09/07/2018 06/17/2018 06/09/2018 04/15/2018 06/09/4817  Systolic BP 563 149  702 637 858 850 277  Diastolic BP 82 78 87 76 80 80 84  Wt. (Lbs) 155 160 159.5 158 155.8 156 159  BMI 19.37 20 20.2 20.01 20 20.03 20.14       Hyperlipidemia LDL goal <70 Hyperlipidemia:Low fat diet discussed and encouraged.   Lipid Panel  Lab Results  Component Value Date   CHOL 190 04/15/2018   HDL 61 04/15/2018   LDLCALC 113 (H) 04/15/2018   TRIG 74 04/15/2018   CHOLHDL 3.1 04/15/2018   Needs to reduce fat intake    NICOTINE ADDICTION Asked:confirms currently smokes  3  cigarettes Assess: Unwilling to quit but cutting back Advise: needs to QUIT to reduce risk of cancer, cardio and cerebrovascular disease and lung failure Assist: counseled for 5 minutes and literature provided Arrange: follow up in 3 months

## 2018-09-09 NOTE — Assessment & Plan Note (Signed)
Neb x 1 , depo medrol 80 mg iM , pred dose pack , antibitoc and decongestant

## 2018-09-09 NOTE — Patient Instructions (Addendum)
F/U in 5 months, call if you need me before  Fasting lipid, cmp and eGFR 1 week before follow up[  You need to decide to STOP smoking and set a quit date  You are referred for screening colonoscopy which is past due appt asked for early next year per your request  Neb treatment, and depo medrol in the office  Prednisone, septra and decongestants prescribed  pls reconsider the flu and pneumonia vaccines, you need both     Steps to Quit Smoking Smoking tobacco can be bad for your health. It can also affect almost every organ in your body. Smoking puts you and people around you at risk for many serious long-lasting (chronic) diseases. Quitting smoking is hard, but it is one of the best things that you can do for your health. It is never too late to quit. What are the benefits of quitting smoking? When you quit smoking, you lower your risk for getting serious diseases and conditions. They can include:  Lung cancer or lung disease.  Heart disease.  Stroke.  Heart attack.  Not being able to have children (infertility).  Weak bones (osteoporosis) and broken bones (fractures).  If you have coughing, wheezing, and shortness of breath, those symptoms may get better when you quit. You may also get sick less often. If you are pregnant, quitting smoking can help to lower your chances of having a baby of low birth weight. What can I do to help me quit smoking? Talk with your doctor about what can help you quit smoking. Some things you can do (strategies) include:  Quitting smoking totally, instead of slowly cutting back how much you smoke over a period of time.  Going to in-person counseling. You are more likely to quit if you go to many counseling sessions.  Using resources and support systems, such as: ? Database administrator with a Social worker. ? Phone quitlines. ? Careers information officer. ? Support groups or group counseling. ? Text messaging programs. ? Mobile phone apps or  applications.  Taking medicines. Some of these medicines may have nicotine in them. If you are pregnant or breastfeeding, do not take any medicines to quit smoking unless your doctor says it is okay. Talk with your doctor about counseling or other things that can help you.  Talk with your doctor about using more than one strategy at the same time, such as taking medicines while you are also going to in-person counseling. This can help make quitting easier. What things can I do to make it easier to quit? Quitting smoking might feel very hard at first, but there is a lot that you can do to make it easier. Take these steps:  Talk to your family and friends. Ask them to support and encourage you.  Call phone quitlines, reach out to support groups, or work with a Social worker.  Ask people who smoke to not smoke around you.  Avoid places that make you want (trigger) to smoke, such as: ? Bars. ? Parties. ? Smoke-break areas at work.  Spend time with people who do not smoke.  Lower the stress in your life. Stress can make you want to smoke. Try these things to help your stress: ? Getting regular exercise. ? Deep-breathing exercises. ? Yoga. ? Meditating. ? Doing a body scan. To do this, close your eyes, focus on one area of your body at a time from head to toe, and notice which parts of your body are tense. Try to relax the muscles  in those areas.  Download or buy apps on your mobile phone or tablet that can help you stick to your quit plan. There are many free apps, such as QuitGuide from the [employer] Office manager for Disease Control and Prevention). You can find more support from smokefree.gov and other websites.  This information is not intended to replace advice given to you by your health care provider. Make sure you discuss any questions you have with your health care provider. Document Released: 08/09/2009 Document Revised: 06/10/2016 Document Reviewed: 02/27/2015 Elsevier Interactive Patient  Education  2018 [employer].

## 2018-09-13 ENCOUNTER — Encounter: Payer: Self-pay | Admitting: Gastroenterology

## 2018-09-16 ENCOUNTER — Encounter: Payer: Self-pay | Admitting: Orthopaedic Surgery

## 2018-09-16 ENCOUNTER — Ambulatory Visit (INDEPENDENT_AMBULATORY_CARE_PROVIDER_SITE_OTHER): Payer: Medicaid Other | Admitting: Orthopaedic Surgery

## 2018-09-16 ENCOUNTER — Ambulatory Visit (INDEPENDENT_AMBULATORY_CARE_PROVIDER_SITE_OTHER): Payer: Medicaid Other

## 2018-09-16 VITALS — BP 133/82 | HR 74 | Ht 75.0 in | Wt 155.0 lb

## 2018-09-16 DIAGNOSIS — I70211 Atherosclerosis of native arteries of extremities with intermittent claudication, right leg: Secondary | ICD-10-CM

## 2018-09-16 DIAGNOSIS — M5442 Lumbago with sciatica, left side: Secondary | ICD-10-CM

## 2018-09-16 DIAGNOSIS — G8929 Other chronic pain: Secondary | ICD-10-CM | POA: Diagnosis not present

## 2018-09-16 DIAGNOSIS — F17210 Nicotine dependence, cigarettes, uncomplicated: Secondary | ICD-10-CM

## 2018-09-16 MED ORDER — PREDNISONE 5 MG (21) PO TBPK: 5.0000 mg | ORAL_TABLET | ORAL | 0 refills | Status: DC

## 2018-09-16 NOTE — Patient Instructions (Addendum)
Steps to Quit Smoking Smoking tobacco can be bad for your health. It can also affect almost every organ in your body. Smoking puts you and people around you at risk for many serious long-lasting (chronic) diseases. Quitting smoking is hard, but it is one of the best things that you can do for your health. It is never too late to quit. What are the benefits of quitting smoking? When you quit smoking, you lower your risk for getting serious diseases and conditions. They can include:  Lung cancer or lung disease.  Heart disease.  Stroke.  Heart attack.  Not being able to have children (infertility).  Weak bones (osteoporosis) and broken bones (fractures).  If you have coughing, wheezing, and shortness of breath, those symptoms may get better when you quit. You may also get sick less often. If you are pregnant, quitting smoking can help to lower your chances of having a baby of low birth weight. What can I do to help me quit smoking? Talk with your doctor about what can help you quit smoking. Some things you can do (strategies) include:  Quitting smoking totally, instead of slowly cutting back how much you smoke over a period of time.  Going to in-person counseling. You are more likely to quit if you go to many counseling sessions.  Using resources and support systems, such as: ? Online chats with a counselor. ? Phone quitlines. ? Printed self-help materials. ? Support groups or group counseling. ? Text messaging programs. ? Mobile phone apps or applications.  Taking medicines. Some of these medicines may have nicotine in them. If you are pregnant or breastfeeding, do not take any medicines to quit smoking unless your doctor says it is okay. Talk with your doctor about counseling or other things that can help you.  Talk with your doctor about using more than one strategy at the same time, such as taking medicines while you are also going to in-person counseling. This can help make  quitting easier. What things can I do to make it easier to quit? Quitting smoking might feel very hard at first, but there is a lot that you can do to make it easier. Take these steps:  Talk to your family and friends. Ask them to support and encourage you.  Call phone quitlines, reach out to support groups, or work with a counselor.  Ask people who smoke to not smoke around you.  Avoid places that make you want (trigger) to smoke, such as: ? Bars. ? Parties. ? Smoke-break areas at work.  Spend time with people who do not smoke.  Lower the stress in your life. Stress can make you want to smoke. Try these things to help your stress: ? Getting regular exercise. ? Deep-breathing exercises. ? Yoga. ? Meditating. ? Doing a body scan. To do this, close your eyes, focus on one area of your body at a time from head to toe, and notice which parts of your body are tense. Try to relax the muscles in those areas.  Download or buy apps on your mobile phone or tablet that can help you stick to your quit plan. There are many free apps, such as QuitGuide from the CDC (Centers for Disease Control and Prevention). You can find more support from smokefree.gov and other websites.  This information is not intended to replace advice given to you by your health care provider. Make sure you discuss any questions you have with your health care provider. Document Released: 08/09/2009 Document   Revised: 06/10/2016 Document Reviewed: 02/27/2015 Elsevier Interactive Patient Education  2018 [employer].  Back Exercises If you have pain in your back, do these exercises 2-3 times each day or as told by your doctor. When the pain goes away, do the exercises once each day, but repeat the steps more times for each exercise (do more repetitions). If you do not have pain in your back, do these exercises once each day or as told by your doctor. Exercises Single Knee to Chest  Do these steps 3-5 times in a row for each  leg: 1. Lie on your back on a firm bed or the floor with your legs stretched out. 2. Bring one knee to your chest. 3. Hold your knee to your chest by grabbing your knee or thigh. 4. Pull on your knee until you feel a gentle stretch in your lower back. 5. Keep doing the stretch for 10-30 seconds. 6. Slowly let go of your leg and straighten it.  Pelvic Tilt  Do these steps 5-10 times in a row: 1. Lie on your back on a firm bed or the floor with your legs stretched out. 2. Bend your knees so they point up to the ceiling. Your feet should be flat on the floor. 3. Tighten your lower belly (abdomen) muscles to press your lower back against the floor. This will make your tailbone point up to the ceiling instead of pointing down to your feet or the floor. 4. Stay in this position for 5-10 seconds while you gently tighten your muscles and breathe evenly.  Cat-Cow  Do these steps until your lower back bends more easily: 1. Get on your hands and knees on a firm surface. Keep your hands under your shoulders, and keep your knees under your hips. You may put padding under your knees. 2. Let your head hang down, and make your tailbone point down to the floor so your lower back is round like the back of a cat. 3. Stay in this position for 5 seconds. 4. Slowly lift your head and make your tailbone point up to the ceiling so your back hangs low (sags) like the back of a cow. 5. Stay in this position for 5 seconds.  Press-Ups  Do these steps 5-10 times in a row: 1. Lie on your belly (face-down) on the floor. 2. Place your hands near your head, about shoulder-width apart. 3. While you keep your back relaxed and keep your hips on the floor, slowly straighten your arms to raise the top half of your body and lift your shoulders. Do not use your back muscles. To make yourself more comfortable, you may change where you place your hands. 4. Stay in this position for 5 seconds. 5. Slowly return to lying flat on  the floor.  Bridges  Do these steps 10 times in a row: 1. Lie on your back on a firm surface. 2. Bend your knees so they point up to the ceiling. Your feet should be flat on the floor. 3. Tighten your butt muscles and lift your butt off of the floor until your waist is almost as high as your knees. If you do not feel the muscles working in your butt and the back of your thighs, slide your feet 1-2 inches farther away from your butt. 4. Stay in this position for 3-5 seconds. 5. Slowly lower your butt to the floor, and let your butt muscles relax.  If this exercise is too easy, try doing it with  your arms crossed over your chest. Belly Crunches  Do these steps 5-10 times in a row: 1. Lie on your back on a firm bed or the floor with your legs stretched out. 2. Bend your knees so they point up to the ceiling. Your feet should be flat on the floor. 3. Cross your arms over your chest. 4. Tip your chin a little bit toward your chest but do not bend your neck. 5. Tighten your belly muscles and slowly raise your chest just enough to lift your shoulder blades a tiny bit off of the floor. 6. Slowly lower your chest and your head to the floor.  Back Lifts Do these steps 5-10 times in a row: 1. Lie on your belly (face-down) with your arms at your sides, and rest your forehead on the floor. 2. Tighten the muscles in your legs and your butt. 3. Slowly lift your chest off of the floor while you keep your hips on the floor. Keep the back of your head in line with the curve in your back. Look at the floor while you do this. 4. Stay in this position for 3-5 seconds. 5. Slowly lower your chest and your face to the floor.  Contact a doctor if:  Your back pain gets a lot worse when you do an exercise.  Your back pain does not lessen 2 hours after you exercise. If you have any of these problems, stop doing the exercises. Do not do them again unless your doctor says it is okay. Get help right away  if:  You have sudden, very bad back pain. If this happens, stop doing the exercises. Do not do them again unless your doctor says it is okay. This information is not intended to replace advice given to you by your health care provider. Make sure you discuss any questions you have with your health care provider. Document Released: 11/15/2010 Document Revised: 03/20/2016 Document Reviewed: 12/07/2014 Elsevier Interactive Patient Education  [employer].

## 2018-09-16 NOTE — Progress Notes (Signed)
[name], male DOB:09/13/1951, 67 y.o. JJK:[id number]  Chief Complaint  Patient presents with  . Back Pain    worse     HPI  Dillon [name] is a 67 y.o. male who has a flare up of lower back pain.  He has been cutting wood and has over done it.  He has more pain that is localized with no paresthesias.  He has pain medicine and Flexeril which help but they are not fully controlling his pain.  He has no weakness.  He has problems sleeping at night because of the pain.     Body mass index is 19.37 kg/m.  ROS  Review of Systems  Respiratory: Positive for shortness of breath. Negative for cough.   Cardiovascular: Negative for chest pain and leg swelling.  Endocrine: Negative for cold intolerance.  Musculoskeletal: Positive for arthralgias and myalgias.  Allergic/Immunologic: Positive for environmental allergies.  All other systems reviewed and are negative.   All other systems reviewed and are negative.  The following is a summary of the past history medically, past history surgically, known current medicines, social history and family history.  This information is gathered electronically by the computer from prior information and documentation.  I review this each visit and have found including this information at this point in the chart is beneficial and informative.    Past Medical History:  Diagnosis Date  . Arthritis    2012  . Asthma    childhood  . COPD (chronic obstructive pulmonary disease) ([place])   . Hyperlipidemia    1990  . Hypertension    1990  . Nicotine addiction   . PAD (peripheral artery disease) ([place]) 2015    Past Surgical History:  Procedure Laterality Date  . KNEE ARTHROSCOPY     both knees   . LOWER EXTREMITY ANGIOGRAPHY N/A 02/04/2018   Procedure: LOWER EXTREMITY ANGIOGRAPHY;  Surgeon: Conrad Clear Lake, MD;  Location: [facility];  Service: Cardiovascular;  Laterality: N/A;    Family History  Problem Relation Age of Onset  .  Heart attack Mother   . Diabetes Mother   . Hypertension Mother   . Stroke Brother   . Hypertension Sister   . Hypertension Sister   . Hypertension Sister   . Hypertension Brother     Social History Social History   Tobacco Use  . Smoking status: Current Some Day Smoker    Packs/day: 0.75    Years: 48.00    Pack years: 36.00    Types: Cigarettes    Start date: 10/16/1969  . Smokeless tobacco: Never Used  . Tobacco comment: hasn't smoked in 3 days   Substance Use Topics  . Alcohol use: Yes    Alcohol/week: 0.0 standard drinks    Comment: occassional , max of 72 oz pwr month  . Drug use: No    Allergies  Allergen Reactions  . Penicillins Hives and Other (See Comments)    Has patient had a PCN reaction causing immediate rash, facial/tongue/throat swelling, SOB or lightheadedness with hypotension: Unknown Has patient had a PCN reaction causing severe rash involving mucus membranes or skin necrosis: No Has patient had a PCN reaction that required hospitalization: Yes Has patient had a PCN reaction occurring within the last 10 years: No If all of the above answers are "NO", then may proceed with Cephalosporin use.     Current Outpatient Medications  Medication Sig Dispense Refill  . albuterol (PROVENTIL HFA;VENTOLIN HFA) 108 (90 Base)  MCG/ACT inhaler Inhale 2 puffs into the lungs every 6 (six) hours as needed for wheezing. 1 Inhaler 3  . amLODipine (NORVASC) 10 MG tablet TAKE 1 TABLET BY MOUTH EVERY DAY 90 tablet 1  . aspirin EC 81 MG tablet Take 81 mg by mouth daily.    . benzonatate (TESSALON) 100 MG capsule Take 1 capsule (100 mg total) by mouth 2 (two) times daily as needed for cough. 14 capsule 0  . budesonide-formoterol (SYMBICORT) 160-4.5 MCG/ACT inhaler Inhale 2 puffs into the lungs 2 (two) times daily. 10.2 Inhaler 5  . cilostazol (PLETAL) 50 MG tablet Take 1 tablet (50 mg total) by mouth 2 (two) times daily. 60 tablet 6  . cyclobenzaprine (FLEXERIL) 10 MG tablet  Take 1 tablet (10 mg total) by mouth at bedtime. One tablet every night at bedtime as needed for spasm. 30 tablet 2  . ergocalciferol (VITAMIN D2) 50000 units capsule Take 1 capsule (50,000 Units total) by mouth once a week. One capsule once weekly (Patient taking differently: Take 50,000 Units by mouth every Tuesday. ) 12 capsule 3  . gabapentin (NEURONTIN) 300 MG capsule TAKE 1 CAPSULE (300 MG TOTAL) BY MOUTH AT BEDTIME. 30 capsule 2  . HYDROcodone-acetaminophen (NORCO/VICODIN) 5-325 MG tablet One tablet every six hours as needed for pain.  30 day limit. 100 tablet 0  . ipratropium (ATROVENT) 0.02 % nebulizer solution Take 2.5 mLs (0.5 mg total) by nebulization every 6 (six) hours as needed for wheezing or shortness of breath. 75 mL 12  . levalbuterol (XOPENEX) 0.63 MG/3ML nebulizer solution Take 3 mLs (0.63 mg total) by nebulization every 8 (eight) hours as needed for wheezing or shortness of breath. 3 mL 12  . meloxicam (MOBIC) 15 MG tablet TAKE 1 TABLET BY MOUTH ONCE DAILY AS NEEDED FOR UNCONTROLLED ARTHRITIS PAIN 90 tablet 0  . nicotine polacrilex (NICORELIEF) 2 MG gum Week 1-6 Chew one piece every 1-2 hours prn for cravings. Week 7-9- One piece every 2-4 hours prn. Week 10-12-  One piece every 4-6 hrs prn. 100 tablet 1  . pravastatin (PRAVACHOL) 80 MG tablet TAKE 1 TABLET BY MOUTH EVERY DAY 90 tablet 0  . predniSONE (STERAPRED UNI-PAK 21 TAB) 5 MG (21) TBPK tablet Take 1 tablet (5 mg total) by mouth as directed. Use as directed 21 tablet 0  . spironolactone (ALDACTONE) 50 MG tablet TAKE 1 TABLET BY MOUTH EVERY DAY 90 tablet 1  . sulfamethoxazole-trimethoprim (BACTRIM DS,SEPTRA DS) 800-160 MG tablet Take 1 tablet by mouth 2 (two) times daily. 14 tablet 0   No current facility-administered medications for this visit.      Physical Exam  Blood pressure 133/82, pulse 74, height 6\' 3"  (1.905 m), weight 155 lb (70.3 kg).  Constitutional: overall normal hygiene, normal nutrition, well developed,  normal grooming, normal body habitus. Assistive device:none  Musculoskeletal: gait and station Limp none, muscle tone and strength are normal, no tremors or atrophy is present.  .  Neurological: coordination overall normal.  Deep tendon reflex/nerve stretch intact.  Sensation normal.  Cranial nerves II-XII intact.   Skin:   Normal overall no scars, lesions, ulcers or rashes. No psoriasis.  Psychiatric: Alert and oriented x 3.  Recent memory intact, remote memory unclear.  Normal mood and affect. Well groomed.  Good eye contact.  Cardiovascular: overall no swelling, no varicosities, no edema bilaterally, normal temperatures of the legs and arms, no clubbing, cyanosis and good capillary refill.  Lymphatic: palpation is normal.  Spine/Pelvis examination:  Inspection:  Overall, sacoiliac joint benign and hips tender; without crepitus or defects.   Thoracic spine inspection: Alignment normal without kyphosis present   Lumbar spine inspection:  Alignment  with normal lumbar lordosis, without scoliosis apparent.   Thoracic spine palpation:  without tenderness of spinal processes   Lumbar spine palpation: with tenderness of lumbar area; with tightness of lumbar muscles    Range of Motion:   Lumbar flexion, forward flexion is 20  with pain or tenderness    Lumbar extension is 5 with pain or tenderness   Left lateral bend is Normal  with pain or tenderness   Right lateral bend is Normal with pain or tenderness   Straight leg raising is Normal   Strength & tone: Normal   Stability overall normal stability    All other systems reviewed and are negative   The patient has been educated about the nature of the problem(s) and counseled on treatment options.  The patient appeared to understand what I have discussed and is in agreement with it.  Encounter Diagnoses  Name Primary?  . Chronic midline low back pain with left-sided sciatica Yes  . Cigarette nicotine dependence without  complication     X-rays of the lumbar spine were done, reported separately.  DJD present diffusely. PLAN Call if any problems.  Precautions discussed.  Continue current medications. I will add prednisone dose pack.  Return to clinic 3 weeks   Electronically Signed Sanjuana Kava, MD 11/21/20199:36 AM

## 2018-09-22 ENCOUNTER — Encounter: Payer: Self-pay | Admitting: Family Medicine

## 2018-09-22 NOTE — Assessment & Plan Note (Signed)
Hyperlipidemia:Low fat diet discussed and encouraged.   Lipid Panel  Lab Results  Component Value Date   CHOL 190 04/15/2018   HDL 61 04/15/2018   LDLCALC 113 (H) 04/15/2018   TRIG 74 04/15/2018   CHOLHDL 3.1 04/15/2018   Needs to reduce fat intake

## 2018-09-22 NOTE — Assessment & Plan Note (Addendum)
Asked:confirms currently smokes  3  cigarettes Assess: Unwilling to quit but cutting back Advise: needs to QUIT to reduce risk of cancer, cardio and cerebrovascular disease and lung failure Assist: counseled for 5 minutes and literature provided Arrange: follow up in 3 months

## 2018-09-22 NOTE — Assessment & Plan Note (Signed)
Controlled, no change in medication DASH diet and commitment to daily physical activity for a minimum of 30 minutes discussed and encouraged, as a part of hypertension management. The importance of attaining a healthy weight is also discussed.  BP/Weight 09/16/2018 09/09/2018 09/07/2018 06/17/2018 06/09/2018 04/15/2018 0/86/7619  Systolic BP 509 326 712 458 [phone]  Diastolic BP 82 78 87 76 80 80 84  Wt. (Lbs) 155 160 159.5 158 155.8 156 159  BMI 19.37 20 20.2 20.01 20 20.03 20.14

## 2018-09-30 ENCOUNTER — Telehealth: Payer: Self-pay | Admitting: Orthopaedic Surgery

## 2018-09-30 MED ORDER — HYDROCODONE-ACETAMINOPHEN 5-325 MG PO TABS: ORAL_TABLET | ORAL | 0 refills | Status: DC

## 2018-09-30 NOTE — Telephone Encounter (Signed)
Hydrocodone-Acetaminophen 5/325 mg  Qty 100 Tablets  PATIENT USES [facility]

## 2018-10-05 ENCOUNTER — Other Ambulatory Visit: Payer: Self-pay

## 2018-10-05 ENCOUNTER — Encounter (HOSPITAL_COMMUNITY): Payer: Self-pay | Admitting: Emergency Medicine

## 2018-10-05 ENCOUNTER — Emergency Department (HOSPITAL_COMMUNITY): Payer: Medicaid Other

## 2018-10-05 ENCOUNTER — Emergency Department (HOSPITAL_COMMUNITY)
Admission: EM | Admit: 2018-10-05 | Discharge: 2018-10-05 | Disposition: A | Discharge status: Home / Self Care | Payer: Medicaid Other | Attending: Emergency Medicine | Admitting: Emergency Medicine

## 2018-10-05 DIAGNOSIS — R0602 Shortness of breath: Secondary | ICD-10-CM | POA: Diagnosis present

## 2018-10-05 DIAGNOSIS — J069 Acute upper respiratory infection, unspecified: Secondary | ICD-10-CM | POA: Diagnosis not present

## 2018-10-05 DIAGNOSIS — Z7982 Long term (current) use of aspirin: Secondary | ICD-10-CM | POA: Diagnosis not present

## 2018-10-05 DIAGNOSIS — Z79899 Other long term (current) drug therapy: Secondary | ICD-10-CM | POA: Insufficient documentation

## 2018-10-05 DIAGNOSIS — J449 Chronic obstructive pulmonary disease, unspecified: Secondary | ICD-10-CM | POA: Diagnosis not present

## 2018-10-05 DIAGNOSIS — F17210 Nicotine dependence, cigarettes, uncomplicated: Secondary | ICD-10-CM | POA: Insufficient documentation

## 2018-10-05 DIAGNOSIS — I2510 Atherosclerotic heart disease of native coronary artery without angina pectoris: Secondary | ICD-10-CM | POA: Insufficient documentation

## 2018-10-05 DIAGNOSIS — I10 Essential (primary) hypertension: Secondary | ICD-10-CM | POA: Insufficient documentation

## 2018-10-05 DIAGNOSIS — J441 Chronic obstructive pulmonary disease with (acute) exacerbation: Secondary | ICD-10-CM

## 2018-10-05 LAB — BASIC METABOLIC PANEL
Anion gap: 9 (ref 5–15)
BUN: 8 mg/dL (ref 8–23)
CO2: 28 mmol/L (ref 22–32)
Calcium: 8.9 mg/dL (ref 8.9–10.3)
Chloride: 102 mmol/L (ref 98–111)
Creatinine, Ser: 0.95 mg/dL (ref 0.61–1.24)
GFR calc Af Amer: >60 mL/min (ref >60)
GFR calc non Af Amer: >60 mL/min (ref >60)
Glucose, Bld: 95 mg/dL (ref 70–99)
Potassium: 3.9 mmol/L (ref 3.5–5.1)
Sodium: 139 mmol/L (ref 135–145)

## 2018-10-05 LAB — CBC WITH DIFFERENTIAL/PLATELET
Abs Immature Granulocytes: 0.01 10*3/uL (ref 0.00–0.07)
Basophils Absolute: 0.1 10*3/uL (ref 0.0–0.1)
Basophils Relative: 1 %
Eosinophils Absolute: 0.7 10*3/uL — ABNORMAL HIGH (ref 0.0–0.5)
Eosinophils Relative: 13 %
HCT: 41.2 % (ref 39.0–52.0)
Hemoglobin: 13.7 g/dL (ref 13.0–17.0)
Immature Granulocytes: 0 %
Lymphocytes Relative: 45 %
Lymphs Abs: 2.3 10*3/uL (ref 0.7–4.0)
MCH: 30.9 pg (ref 26.0–34.0)
MCHC: 33.3 g/dL (ref 30.0–36.0)
MCV: 93 fL (ref 80.0–100.0)
Monocytes Absolute: 0.4 10*3/uL (ref 0.1–1.0)
Monocytes Relative: 8 %
Neutro Abs: 1.7 10*3/uL (ref 1.7–7.7)
Neutrophils Relative %: 33 %
Platelets: 323 10*3/uL (ref 150–400)
RBC: 4.43 MIL/uL (ref 4.22–5.81)
RDW: 14.2 % (ref 11.5–15.5)
WBC: 5.2 10*3/uL (ref 4.0–10.5)
nRBC: 0 % (ref 0.0–0.2)

## 2018-10-05 MED ORDER — ALBUTEROL SULFATE (2.5 MG/3ML) 0.083% IN NEBU
INHALATION_SOLUTION | RESPIRATORY_TRACT | Status: AC
  Filled 2018-10-05: qty 3

## 2018-10-05 MED ORDER — ALBUTEROL SULFATE (2.5 MG/3ML) 0.083% IN NEBU: 5.0000 mg | INHALATION_SOLUTION | Freq: Once | RESPIRATORY_TRACT | Status: DC

## 2018-10-05 MED ORDER — IPRATROPIUM BROMIDE 0.02 % IN SOLN: 0.5000 mg | Freq: Once | RESPIRATORY_TRACT | Status: DC

## 2018-10-05 MED ORDER — DOXYCYCLINE HYCLATE 100 MG PO CAPS: 100.0000 mg | ORAL_CAPSULE | Freq: Two times a day (BID) | ORAL | 0 refills | Status: DC

## 2018-10-05 MED ORDER — IPRATROPIUM-ALBUTEROL 0.5-2.5 (3) MG/3ML IN SOLN
3.0000 mL | Freq: Once | RESPIRATORY_TRACT | Status: AC
  Administered 2018-10-05: 3 mL via RESPIRATORY_TRACT

## 2018-10-05 MED ORDER — GUAIFENESIN-DM 100-10 MG/5ML PO SYRP: 5.0000 mL | ORAL_SOLUTION | ORAL | 0 refills | Status: DC | PRN

## 2018-10-05 MED ORDER — PREDNISONE 20 MG PO TABS: 40.0000 mg | ORAL_TABLET | Freq: Every day | ORAL | 0 refills | Status: DC

## 2018-10-05 MED ORDER — METHYLPREDNISOLONE SODIUM SUCC 125 MG IJ SOLR
125.0000 mg | Freq: Once | INTRAMUSCULAR | Status: AC
  Administered 2018-10-05: 125 mg via INTRAVENOUS
  Filled 2018-10-05: qty 2

## 2018-10-05 MED ORDER — ALBUTEROL SULFATE (2.5 MG/3ML) 0.083% IN NEBU
2.5000 mg | INHALATION_SOLUTION | Freq: Once | RESPIRATORY_TRACT | Status: AC
  Administered 2018-10-05: 2.5 mg via RESPIRATORY_TRACT

## 2018-10-05 MED ORDER — IPRATROPIUM-ALBUTEROL 0.5-2.5 (3) MG/3ML IN SOLN
RESPIRATORY_TRACT | Status: AC
  Filled 2018-10-05: qty 3

## 2018-10-05 NOTE — ED Triage Notes (Signed)
Pt c/o of sob and productive cough over a week.  Takes inhalers and prescribed meds at home with no relief.  Denies wearing oxygen at home. Wheezing noted.

## 2018-10-05 NOTE — ED Provider Notes (Signed)
[facility] EMERGENCY DEPARTMENT Provider Note   CSN: [id number] Arrival date & time: 10/05/18  0300     History   Chief Complaint Chief Complaint  Patient presents with  . Shortness of Breath    HPI Dillon [name] is a 67 y.o. male.  HPI Patient presents with shortness of breath and cough.  Has had some symptoms for around a week.  States much worse last night.  Has been coughing with some clear sputum production.  History of COPD.  Has had chills without frank fevers.  Not on oxygen at home.  No real relief with his inhalers.  States he does have some right-sided chest pain that is worse with coughing. Past Medical History:  Diagnosis Date  . Arthritis    2012  . Asthma    childhood  . COPD (chronic obstructive pulmonary disease) ([place])   . Hyperlipidemia    1990  . Hypertension    1990  . Nicotine addiction   . PAD (peripheral artery disease) ([place]) 2015    Patient Active Problem List   Diagnosis Date Noted  . Abdominal aortic aneurysm (AAA) ([place]) 06/10/2018  . Hyperlipidemia LDL goal <70 04/16/2018  . Atherosclerosis of native arteries of extremity with intermittent claudication ([place]) 01/20/2018  . Vitamin D deficiency 10/16/2017  . Generalized arthritis 10/16/2017  . Elevated PSA 04/30/2016  . Lung nodules 02/22/2015  . CAD (coronary atherosclerotic disease) 04/13/2014  . Acute bronchitis with COPD ([place]) 06/27/2010  . Low back pain with left-sided sciatica 12/19/2008  . NICOTINE ADDICTION 02/24/2008  . Essential hypertension 02/24/2008    Past Surgical History:  Procedure Laterality Date  . KNEE ARTHROSCOPY     both knees   . LOWER EXTREMITY ANGIOGRAPHY N/A 02/04/2018   Procedure: LOWER EXTREMITY ANGIOGRAPHY;  Surgeon: Conrad Perd[place], MD;  Location: [facility];  Service: Cardiovascular;  Laterality: N/A;        Home Medications    Prior to Admission medications   Medication Sig Start Date End Date Taking? Authorizing Provider  albuterol  (PROVENTIL HFA;VENTOLIN HFA) 108 (90 Base) MCG/ACT inhaler Inhale 2 puffs into the lungs every 6 (six) hours as needed for wheezing. 08/30/18 08/30/19  Fayrene Helper, MD  amLODipine (NORVASC) 10 MG tablet TAKE 1 TABLET BY MOUTH EVERY DAY 08/23/18   Fayrene Helper, MD  aspirin EC 81 MG tablet Take 81 mg by mouth daily.    [provider]  benzonatate (TESSALON) 100 MG capsule Take 1 capsule (100 mg total) by mouth 2 (two) times daily as needed for cough. 09/09/18   Fayrene Helper, MD  budesonide-formoterol [facility]) 160-4.5 MCG/ACT inhaler Inhale 2 puffs into the lungs 2 (two) times daily. 06/09/18   Fayrene Helper, MD  cilostazol (PLETAL) 50 MG tablet Take 1 tablet (50 mg total) by mouth 2 (two) times daily. 12/03/17   Arnoldo Lenis, MD  cyclobenzaprine (FLEXERIL) 10 MG tablet Take 1 tablet (10 mg total) by mouth at bedtime. One tablet every night at bedtime as needed for spasm. 06/17/18   Sanjuana Kava, MD  doxycycline (VIBRAMYCIN) 100 MG capsule Take 1 capsule (100 mg total) by mouth 2 (two) times daily. 10/05/18   Davonna Belling, MD  ergocalciferol (VITAMIN D2) 50000 units capsule Take 1 capsule (50,000 Units total) by mouth once a week. One capsule once weekly Patient taking differently: Take 50,000 Units by mouth every Tuesday.  10/16/17   Fayrene Helper, MD  gabapentin (NEURONTIN) 300 MG capsule  TAKE 1 CAPSULE (300 MG TOTAL) BY MOUTH AT BEDTIME. 12/21/15   Fayrene Helper, MD  guaiFENesin-dextromethorphan (ROBITUSSIN DM) 100-10 MG/5ML syrup Take 5 mLs by mouth every 4 (four) hours as needed for cough. 10/05/18   Davonna Belling, MD  HYDROcodone-acetaminophen (NORCO/VICODIN) 5-325 MG tablet One tablet every six hours as needed for pain.  30 day limit. 09/30/18   Sanjuana Kava, MD  ipratropium (ATROVENT) 0.02 % nebulizer solution Take 2.5 mLs (0.5 mg total) by nebulization every 6 (six) hours as needed for wheezing or shortness of breath. 06/09/18    Fayrene Helper, MD  levalbuterol [facility]) 0.63 MG/3ML nebulizer solution Take 3 mLs (0.63 mg total) by nebulization every 8 (eight) hours as needed for wheezing or shortness of breath. 06/09/18   Fayrene Helper, MD  meloxicam (MOBIC) 15 MG tablet TAKE 1 TABLET BY MOUTH ONCE DAILY AS NEEDED FOR UNCONTROLLED ARTHRITIS PAIN 07/14/18   Fayrene Helper, MD  nicotine polacrilex (NICORELIEF) 2 MG gum Week 1-6 Chew one piece every 1-2 hours prn for cravings. Week 7-9- One piece every 2-4 hours prn. Week 10-12-  One piece every 4-6 hrs prn. 06/09/18   Fayrene Helper, MD  pravastatin (PRAVACHOL) 80 MG tablet TAKE 1 TABLET BY MOUTH EVERY DAY 07/20/18   Fayrene Helper, MD  predniSONE (DELTASONE) 20 MG tablet Take 2 tablets (40 mg total) by mouth daily. 10/05/18   Davonna Belling, MD  spironolactone (ALDACTONE) 50 MG tablet TAKE 1 TABLET BY MOUTH EVERY DAY 08/17/18   Fayrene Helper, MD  sulfamethoxazole-trimethoprim (BACTRIM DS,SEPTRA DS) 800-160 MG tablet Take 1 tablet by mouth 2 (two) times daily. 09/09/18   Fayrene Helper, MD    Family History Family History  Problem Relation Age of Onset  . Heart attack Mother   . Diabetes Mother   . Hypertension Mother   . Stroke Brother   . Hypertension Sister   . Hypertension Sister   . Hypertension Sister   . Hypertension Brother     Social History Social History   Tobacco Use  . Smoking status: Current Some Day Smoker    Packs/day: 0.75    Years: 48.00    Pack years: 36.00    Types: Cigarettes    Start date: 10/16/1969  . Smokeless tobacco: Never Used  . Tobacco comment: hasn't smoked in 3 days   Substance Use Topics  . Alcohol use: Yes    Alcohol/week: 0.0 standard drinks    Comment: occassional , max of 72 oz pwr month  . Drug use: No     Allergies   Penicillins   Review of Systems Review of Systems  Constitutional: Positive for chills. Negative for appetite change.  HENT: Positive for congestion.     Respiratory: Positive for cough and shortness of breath.   Cardiovascular: Positive for chest pain.  Gastrointestinal: Negative for abdominal pain.  Genitourinary: Negative for flank pain.  Musculoskeletal: Negative for back pain.  Skin: Negative for rash.  Neurological: Negative for weakness.  Hematological: Negative for adenopathy.  Psychiatric/Behavioral: Negative for confusion.     Physical Exam Updated Vital Signs BP (!) 147/109   Pulse 98   Temp 98.3 F (36.8 C) (Oral)   Resp (!) 26   Ht 6\' 3"  (1.905 m)   Wt 70.3 kg   SpO2 97%   BMI 19.37 kg/m   Physical Exam  Constitutional: He appears well-developed.  HENT:  Head: Atraumatic.  Eyes: Pupils are equal, round, and reactive to light.  Neck: Neck supple.  Cardiovascular: Regular rhythm.  Pulmonary/Chest:  Diffuse wheezes and prolonged expirations.  Abdominal: There is no tenderness.  Musculoskeletal:       Right lower leg: He exhibits no edema.       Left lower leg: He exhibits no edema.  Neurological: He is alert.  Skin: Skin is warm. Capillary refill takes less than 2 seconds.     ED Treatments / Results  Labs (all labs ordered are listed, but only abnormal results are displayed) Labs Reviewed  CBC WITH DIFFERENTIAL/PLATELET - Abnormal; Notable for the following components:      Result Value   Eosinophils Absolute 0.7 (*)    All other components within normal limits  BASIC METABOLIC PANEL    EKG EKG Interpretation  Date/Time:  Tuesday October 05 2018 07:52:53 EST Ventricular Rate:  93 PR Interval:    QRS Duration: 80 QT Interval:  342 QTC Calculation: 426 R Axis:   76 Text Interpretation:  Sinus rhythm Probable anteroseptal infarct, old Baseline wander Confirmed by Davonna Belling [phone]) on 10/05/2018 7:55:12 AM   Radiology Dg Chest 2 View  Result Date: 10/05/2018 CLINICAL DATA:  Shortness of breath.  Productive cough. EXAM: CHEST - 2 VIEW COMPARISON:  CT 05/31/2018. FINDINGS:  Mediastinum and hilar structures normal. Heart size normal. Stable nodular opacities are noted within the right lung. Bilateral nipple shadows noted. Changes COPD with pleuroparenchymal scarring noted. No acute pulmonary abnormality identified. Heart size stable. No acute bony abnormality IMPRESSION: 1. Stable nodular densities in the right lung. COPD. Chest is stable from prior exams. 2.  No acute pulmonary abnormality. Electronically Signed   By: Marcello Moores  Register   On: 10/05/2018 09:02    Procedures Procedures (including critical care time)  Medications Ordered in ED Medications  methylPREDNISolone sodium succinate (SOLU-MEDROL) 125 mg/2 mL injection 125 mg (125 mg Intravenous Given 10/05/18 0814)  ipratropium-albuterol (DUONEB) 0.5-2.5 (3) MG/3ML nebulizer solution 3 mL ( Nebulization Not Given 10/05/18 0901)  albuterol (PROVENTIL) (2.5 MG/3ML) 0.083% nebulizer solution 2.5 mg (2.5 mg Nebulization Given 10/05/18 0846)     Initial Impression / Assessment and Plan / ED Course  I have reviewed the triage vital signs and the nursing notes.  Pertinent labs & imaging results that were available during my care of the patient were reviewed by me and considered in my medical decision making (see chart for details).     Patient with shortness of breath.  Cough with sputum production.  History of COPD.  Initially quite wheezy.  Improved after treatment.  Not hypoxic.  Feels better after treatment and will discharge home.  With increase sputum production and COPD will also give antibiotics  Final Clinical Impressions(s) / ED Diagnoses   Final diagnoses:  COPD exacerbation ([place])  Upper respiratory tract infection, unspecified type    ED Discharge Orders         Ordered    predniSONE (DELTASONE) 20 MG tablet  Daily     10/05/18 1003    doxycycline (VIBRAMYCIN) 100 MG capsule  2 times daily     10/05/18 1003    guaiFENesin-dextromethorphan (ROBITUSSIN DM) 100-10 MG/5ML syrup  Every 4 hours  PRN     10/05/18 1003           Davonna Belling, MD 10/05/18 1005

## 2018-10-06 ENCOUNTER — Other Ambulatory Visit: Payer: Self-pay | Admitting: Family Medicine

## 2018-10-07 ENCOUNTER — Ambulatory Visit (INDEPENDENT_AMBULATORY_CARE_PROVIDER_SITE_OTHER): Payer: Medicaid Other | Admitting: Orthopaedic Surgery

## 2018-10-07 ENCOUNTER — Encounter: Payer: Self-pay | Admitting: Orthopaedic Surgery

## 2018-10-07 VITALS — BP 132/86 | HR 79 | Ht 75.0 in | Wt 159.0 lb

## 2018-10-07 DIAGNOSIS — I70211 Atherosclerosis of native arteries of extremities with intermittent claudication, right leg: Secondary | ICD-10-CM

## 2018-10-07 DIAGNOSIS — M5442 Lumbago with sciatica, left side: Secondary | ICD-10-CM

## 2018-10-07 DIAGNOSIS — G8929 Other chronic pain: Secondary | ICD-10-CM

## 2018-10-07 DIAGNOSIS — F17210 Nicotine dependence, cigarettes, uncomplicated: Secondary | ICD-10-CM

## 2018-10-07 NOTE — Progress Notes (Signed)
Patient [name] Dillon [name], male DOB:Sep 19, 1951, 67 y.o. BZJ:[id number]  Chief Complaint  Patient presents with  . Back Pain    Low back pain    HPI  Dillon [name] is a 67 y.o. male who has continued lower back pain. His pain is less but still present.  He has no paresthesias.  He has modified his activities and that has helped.  He is taking his medicine.  He is doing his exercises.  He has no weakness.  The cold weather has made it worse as well.   Body mass index is 19.87 kg/m.  ROS  Review of Systems  Respiratory: Positive for shortness of breath. Negative for cough.   Cardiovascular: Negative for chest pain and leg swelling.  Endocrine: Negative for cold intolerance.  Musculoskeletal: Positive for arthralgias and myalgias.  Allergic/Immunologic: Positive for environmental allergies.  All other systems reviewed and are negative.   All other systems reviewed and are negative.  The following is a summary of the past history medically, past history surgically, known current medicines, social history and family history.  This information is gathered electronically by the computer from prior information and documentation.  I review this each visit and have found including this information at this point in the chart is beneficial and informative.    Past Medical History:  Diagnosis Date  . Arthritis    2012  . Asthma    childhood  . COPD (chronic obstructive pulmonary disease) ([place])   . Hyperlipidemia    1990  . Hypertension    1990  . Nicotine addiction   . PAD (peripheral artery disease) ([place]) 2015    Past Surgical History:  Procedure Laterality Date  . KNEE ARTHROSCOPY     both knees   . LOWER EXTREMITY ANGIOGRAPHY N/A 02/04/2018   Procedure: LOWER EXTREMITY ANGIOGRAPHY;  Surgeon: Conrad Orosi, MD;  Location: [facility];  Service: Cardiovascular;  Laterality: N/A;    Family History  Problem Relation Age of Onset  . Heart attack Mother   . Diabetes  Mother   . Hypertension Mother   . Stroke Brother   . Hypertension Sister   . Hypertension Sister   . Hypertension Sister   . Hypertension Brother     Social History Social History   Tobacco Use  . Smoking status: Current Some Day Smoker    Packs/day: 0.75    Years: 48.00    Pack years: 36.00    Types: Cigarettes    Start date: 10/16/1969  . Smokeless tobacco: Never Used  . Tobacco comment: hasn't smoked in 3 days   Substance Use Topics  . Alcohol use: Yes    Alcohol/week: 0.0 standard drinks    Comment: occassional , max of 72 oz pwr month  . Drug use: No    Allergies  Allergen Reactions  . Penicillins Hives and Other (See Comments)    Has patient had a PCN reaction causing immediate rash, facial/tongue/throat swelling, SOB or lightheadedness with hypotension: Unknown Has patient had a PCN reaction causing severe rash involving mucus membranes or skin necrosis: No Has patient had a PCN reaction that required hospitalization: Yes Has patient had a PCN reaction occurring within the last 10 years: No If all of the above answers are "NO", then may proceed with Cephalosporin use.     Current Outpatient Medications  Medication Sig Dispense Refill  . albuterol (PROVENTIL HFA;VENTOLIN HFA) 108 (90 Base) MCG/ACT inhaler Inhale 2 puffs into the lungs every 6 (  six) hours as needed for wheezing. 1 Inhaler 3  . amLODipine (NORVASC) 10 MG tablet TAKE 1 TABLET BY MOUTH EVERY DAY 90 tablet 1  . aspirin EC 81 MG tablet Take 81 mg by mouth daily.    . benzonatate (TESSALON) 100 MG capsule Take 1 capsule (100 mg total) by mouth 2 (two) times daily as needed for cough. 14 capsule 0  . budesonide-formoterol (SYMBICORT) 160-4.5 MCG/ACT inhaler Inhale 2 puffs into the lungs 2 (two) times daily. 10.2 Inhaler 5  . cilostazol (PLETAL) 50 MG tablet Take 1 tablet (50 mg total) by mouth 2 (two) times daily. 60 tablet 6  . cyclobenzaprine (FLEXERIL) 10 MG tablet Take 1 tablet (10 mg total) by mouth  at bedtime. One tablet every night at bedtime as needed for spasm. 30 tablet 2  . doxycycline (VIBRAMYCIN) 100 MG capsule Take 1 capsule (100 mg total) by mouth 2 (two) times daily. 10 capsule 0  . ergocalciferol (VITAMIN D2) 50000 units capsule Take 1 capsule (50,000 Units total) by mouth once a week. One capsule once weekly (Patient taking differently: Take 50,000 Units by mouth every Tuesday. ) 12 capsule 3  . gabapentin (NEURONTIN) 300 MG capsule TAKE 1 CAPSULE (300 MG TOTAL) BY MOUTH AT BEDTIME. 30 capsule 2  . guaiFENesin-dextromethorphan (ROBITUSSIN DM) 100-10 MG/5ML syrup Take 5 mLs by mouth every 4 (four) hours as needed for cough. 118 mL 0  . HYDROcodone-acetaminophen (NORCO/VICODIN) 5-325 MG tablet One tablet every six hours as needed for pain.  30 day limit. 100 tablet 0  . ipratropium (ATROVENT) 0.02 % nebulizer solution Take 2.5 mLs (0.5 mg total) by nebulization every 6 (six) hours as needed for wheezing or shortness of breath. 75 mL 12  . levalbuterol (XOPENEX) 0.63 MG/3ML nebulizer solution Take 3 mLs (0.63 mg total) by nebulization every 8 (eight) hours as needed for wheezing or shortness of breath. 3 mL 12  . meloxicam (MOBIC) 15 MG tablet TAKE 1 TABLET BY MOUTH ONCE DAILY AS NEEDED FOR UNCONTROLLED ARTHRITIS PAIN 90 tablet 0  . nicotine polacrilex (NICORELIEF) 2 MG gum Week 1-6 Chew one piece every 1-2 hours prn for cravings. Week 7-9- One piece every 2-4 hours prn. Week 10-12-  One piece every 4-6 hrs prn. 100 tablet 1  . pravastatin (PRAVACHOL) 80 MG tablet TAKE 1 TABLET BY MOUTH EVERY DAY 90 tablet 0  . predniSONE (DELTASONE) 20 MG tablet Take 2 tablets (40 mg total) by mouth daily. 8 tablet 0  . spironolactone (ALDACTONE) 50 MG tablet TAKE 1 TABLET BY MOUTH EVERY DAY 90 tablet 1  . sulfamethoxazole-trimethoprim (BACTRIM DS,SEPTRA DS) 800-160 MG tablet Take 1 tablet by mouth 2 (two) times daily. 14 tablet 0   No current facility-administered medications for this visit.       Physical Exam  Blood pressure 132/86, pulse 79, height 6\' 3"  (1.905 m), weight 159 lb (72.1 kg).  Constitutional: overall normal hygiene, normal nutrition, well developed, normal grooming, normal body habitus. Assistive device:none  Musculoskeletal: gait and station Limp none, muscle tone and strength are normal, no tremors or atrophy is present.  .  Neurological: coordination overall normal.  Deep tendon reflex/nerve stretch intact.  Sensation normal.  Cranial nerves II-XII intact.   Skin:   Normal overall no scars, lesions, ulcers or rashes. No psoriasis.  Psychiatric: Alert and oriented x 3.  Recent memory intact, remote memory unclear.  Normal mood and affect. Well groomed.  Good eye contact.  Cardiovascular: overall no swelling, no varicosities,  no edema bilaterally, normal temperatures of the legs and arms, no clubbing, cyanosis and good capillary refill.  Lymphatic: palpation is normal.  Spine/Pelvis examination:  Inspection:  Overall, sacoiliac joint benign and hips nontender; without crepitus or defects.   Thoracic spine inspection: Alignment normal without kyphosis present   Lumbar spine inspection:  Alignment  with normal lumbar lordosis, without scoliosis apparent.   Thoracic spine palpation:  without tenderness of spinal processes   Lumbar spine palpation: without tenderness of lumbar area; without tightness of lumbar muscles    Range of Motion:   Lumbar flexion, forward flexion is normal without pain or tenderness    Lumbar extension is full without pain or tenderness   Left lateral bend is normal without pain or tenderness   Right lateral bend is normal without pain or tenderness   Straight leg raising is normal  Strength & tone: normal   Stability overall normal stability  All other systems reviewed and are negative   The patient has been educated about the nature of the problem(s) and counseled on treatment options.  The patient appeared to  understand what I have discussed and is in agreement with it.  Encounter Diagnoses  Name Primary?  . Chronic midline low back pain with left-sided sciatica Yes  . Cigarette nicotine dependence without complication     PLAN Call if any problems.  Precautions discussed.  Continue current medications.   Return to clinic 1 month   Electronically [place], MD 12/12/20199:06 AM

## 2018-10-14 ENCOUNTER — Emergency Department (HOSPITAL_COMMUNITY): Payer: Medicaid Other

## 2018-10-14 ENCOUNTER — Encounter (HOSPITAL_COMMUNITY): Payer: Self-pay

## 2018-10-14 ENCOUNTER — Other Ambulatory Visit: Payer: Self-pay

## 2018-10-14 ENCOUNTER — Emergency Department (HOSPITAL_COMMUNITY)
Admission: EM | Admit: 2018-10-14 | Discharge: 2018-10-14 | Disposition: A | Discharge status: Home / Self Care | Payer: Medicaid Other | Attending: Emergency Medicine | Admitting: Emergency Medicine

## 2018-10-14 DIAGNOSIS — I2510 Atherosclerotic heart disease of native coronary artery without angina pectoris: Secondary | ICD-10-CM | POA: Diagnosis not present

## 2018-10-14 DIAGNOSIS — R0600 Dyspnea, unspecified: Secondary | ICD-10-CM

## 2018-10-14 DIAGNOSIS — R7989 Other specified abnormal findings of blood chemistry: Secondary | ICD-10-CM | POA: Diagnosis not present

## 2018-10-14 DIAGNOSIS — R05 Cough: Secondary | ICD-10-CM | POA: Diagnosis present

## 2018-10-14 DIAGNOSIS — J45909 Unspecified asthma, uncomplicated: Secondary | ICD-10-CM | POA: Diagnosis not present

## 2018-10-14 DIAGNOSIS — Z79899 Other long term (current) drug therapy: Secondary | ICD-10-CM | POA: Diagnosis not present

## 2018-10-14 DIAGNOSIS — Z7982 Long term (current) use of aspirin: Secondary | ICD-10-CM | POA: Diagnosis not present

## 2018-10-14 DIAGNOSIS — R778 Other specified abnormalities of plasma proteins: Secondary | ICD-10-CM

## 2018-10-14 DIAGNOSIS — F17210 Nicotine dependence, cigarettes, uncomplicated: Secondary | ICD-10-CM | POA: Insufficient documentation

## 2018-10-14 DIAGNOSIS — I10 Essential (primary) hypertension: Secondary | ICD-10-CM | POA: Diagnosis not present

## 2018-10-14 LAB — CBC WITH DIFFERENTIAL/PLATELET
Abs Immature Granulocytes: 0.02 10*3/uL (ref 0.00–0.07)
Basophils Absolute: 0.1 10*3/uL (ref 0.0–0.1)
Basophils Relative: 1 %
Eosinophils Absolute: 0.9 10*3/uL — ABNORMAL HIGH (ref 0.0–0.5)
Eosinophils Relative: 13 %
HCT: 44.3 % (ref 39.0–52.0)
Hemoglobin: 14.2 g/dL (ref 13.0–17.0)
Immature Granulocytes: 0 %
Lymphocytes Relative: 35 %
Lymphs Abs: 2.4 10*3/uL (ref 0.7–4.0)
MCH: 30.3 pg (ref 26.0–34.0)
MCHC: 32.1 g/dL (ref 30.0–36.0)
MCV: 94.7 fL (ref 80.0–100.0)
Monocytes Absolute: 0.4 10*3/uL (ref 0.1–1.0)
Monocytes Relative: 6 %
Neutro Abs: 3 10*3/uL (ref 1.7–7.7)
Neutrophils Relative %: 45 %
Platelets: 416 10*3/uL — ABNORMAL HIGH (ref 150–400)
RBC: 4.68 MIL/uL (ref 4.22–5.81)
RDW: 14.9 % (ref 11.5–15.5)
WBC: 6.7 10*3/uL (ref 4.0–10.5)
nRBC: 0 % (ref 0.0–0.2)

## 2018-10-14 LAB — COMPREHENSIVE METABOLIC PANEL
ALT: 28 U/L (ref 0–44)
AST: 39 U/L (ref 15–41)
Albumin: 4.4 g/dL (ref 3.5–5.0)
Alkaline Phosphatase: 58 U/L (ref 38–126)
Anion gap: 9 (ref 5–15)
BUN: 11 mg/dL (ref 8–23)
CO2: 30 mmol/L (ref 22–32)
Calcium: 9.1 mg/dL (ref 8.9–10.3)
Chloride: 99 mmol/L (ref 98–111)
Creatinine, Ser: 0.93 mg/dL (ref 0.61–1.24)
GFR calc Af Amer: >60 mL/min (ref >60)
GFR calc non Af Amer: >60 mL/min (ref >60)
Glucose, Bld: 98 mg/dL (ref 70–99)
Potassium: 3.7 mmol/L (ref 3.5–5.1)
Sodium: 138 mmol/L (ref 135–145)
Total Bilirubin: 0.7 mg/dL (ref 0.3–1.2)
Total Protein: 7.9 g/dL (ref 6.5–8.1)

## 2018-10-14 LAB — TROPONIN I
Troponin I: 0.03 ng/mL (ref <0.03)
Troponin I: 0.06 ng/mL (ref <0.03)

## 2018-10-14 MED ORDER — IPRATROPIUM-ALBUTEROL 0.5-2.5 (3) MG/3ML IN SOLN
3.0000 mL | Freq: Once | RESPIRATORY_TRACT | Status: AC
  Administered 2018-10-14: 3 mL via RESPIRATORY_TRACT
  Filled 2018-10-14: qty 3

## 2018-10-14 MED ORDER — METHYLPREDNISOLONE SODIUM SUCC 125 MG IJ SOLR
80.0000 mg | Freq: Once | INTRAMUSCULAR | Status: AC
  Administered 2018-10-14: 80 mg via INTRAVENOUS
  Filled 2018-10-14: qty 2

## 2018-10-14 MED ORDER — DOXYCYCLINE HYCLATE 100 MG PO CAPS: 100.0000 mg | ORAL_CAPSULE | Freq: Two times a day (BID) | ORAL | 0 refills | Status: DC

## 2018-10-14 MED ORDER — SODIUM CHLORIDE 0.9 % IV BOLUS
500.0000 mL | Freq: Once | INTRAVENOUS | Status: AC
  Administered 2018-10-14: 500 mL via INTRAVENOUS

## 2018-10-14 MED ORDER — PREDNISONE 10 MG PO TABS: ORAL_TABLET | ORAL | 0 refills | Status: DC

## 2018-10-14 NOTE — ED Notes (Signed)
CRITICAL VALUE ALERT  Critical Value:  Troponin 0.03  Date & Time Notied:  10/14/18 0912  Provider Notified: Dr. Lacinda Axon   Orders Received/Actions taken: None yet

## 2018-10-14 NOTE — ED Notes (Signed)
Have notified respiratory.  

## 2018-10-14 NOTE — ED Triage Notes (Signed)
Pt called out due to SOB. COPD exacerbation. Has had a productive cough that is light yellow in color. Afebrile. Possible small spot on lung that could be cancerous. Slightly labored breathing. 2.5 Albuterol given. BP 182/107. 1inch of Nitro paste applied by EMS. 20 G Right Hand

## 2018-10-14 NOTE — ED Notes (Signed)
CRITICAL VALUE ALERT  Critical Value: Troponin 0.06  Date & Time Notied:  10/14/18 1343  Provider Notified: Dr. Lacinda Axon   Orders Received/Actions taken: None yet

## 2018-10-14 NOTE — Discharge Instructions (Addendum)
Your troponin was slightly elevated.  This is a Community education officer with your heart.  Recommend follow-up with cardiologist.  Phone number given.  Return here if worse.  Also prescription for antibiotic and prednisone.  Use your inhaler.  Stop smoking.  We discussed the small node in your lung.  This will be followed up by a CT scan next August which can be organized by your primary care doctor.

## 2018-10-15 NOTE — ED Provider Notes (Signed)
[facility] EMERGENCY DEPARTMENT Provider Note   CSN: [id number] Arrival date & time: 10/14/18  5852     History   Chief Complaint Chief Complaint  Patient presents with  . Shortness of Breath    HPI Dillon [name] is a 67 y.o. male.  5 caveat for urgent need for intervention.  Dyspnea, productive cough this morning.  No substernal chest pain.  Initial blood pressure 182/107.  Patient was given an albuterol treatment via EMS and nitroglycerin paste.  Patient has a known history of COPD, hyperlipidemia, hypertension, cigarette smoking.  No known cardiac disease.  Severity is moderate.      Past Medical History:  Diagnosis Date  . Arthritis    2012  . Asthma    childhood  . COPD (chronic obstructive pulmonary disease) ([place])   . Hyperlipidemia    1990  . Hypertension    1990  . Nicotine addiction   . PAD (peripheral artery disease) ([place]) 2015    Patient Active Problem List   Diagnosis Date Noted  . Abdominal aortic aneurysm (AAA) ([place]) 06/10/2018  . Hyperlipidemia LDL goal <70 04/16/2018  . Atherosclerosis of native arteries of extremity with intermittent claudication ([place]) 01/20/2018  . Vitamin D deficiency 10/16/2017  . Generalized arthritis 10/16/2017  . Elevated PSA 04/30/2016  . Lung nodules 02/22/2015  . CAD (coronary atherosclerotic disease) 04/13/2014  . Acute bronchitis with COPD ([place]) 06/27/2010  . Low back pain with left-sided sciatica 12/19/2008  . NICOTINE ADDICTION 02/24/2008  . Essential hypertension 02/24/2008    Past Surgical History:  Procedure Laterality Date  . KNEE ARTHROSCOPY     both knees   . LOWER EXTREMITY ANGIOGRAPHY N/A 02/04/2018   Procedure: LOWER EXTREMITY ANGIOGRAPHY;  Surgeon: Conrad Katie, MD;  Location: [facility];  Service: Cardiovascular;  Laterality: N/A;        Home Medications    Prior to Admission medications   Medication Sig Start Date End Date Taking? Authorizing Provider  albuterol (PROVENTIL  HFA;VENTOLIN HFA) 108 (90 Base) MCG/ACT inhaler Inhale 2 puffs into the lungs every 6 (six) hours as needed for wheezing. 08/30/18 08/30/19 Yes Fayrene Helper, MD  amLODipine (NORVASC) 10 MG tablet TAKE 1 TABLET BY MOUTH EVERY DAY 08/23/18  Yes Fayrene Helper, MD  aspirin EC 81 MG tablet Take 81 mg by mouth daily.   Yes [provider]  cilostazol (PLETAL) 50 MG tablet Take 1 tablet (50 mg total) by mouth 2 (two) times daily. 12/03/17  Yes Branch, Alphonse Guild, MD  cyclobenzaprine (FLEXERIL) 10 MG tablet Take 1 tablet (10 mg total) by mouth at bedtime. One tablet every night at bedtime as needed for spasm. 06/17/18  Yes Sanjuana Kava, MD  doxycycline (VIBRAMYCIN) 100 MG capsule Take 1 capsule (100 mg total) by mouth 2 (two) times daily. 10/05/18  Yes Davonna Belling, MD  ergocalciferol (VITAMIN D2) 50000 units capsule Take 1 capsule (50,000 Units total) by mouth once a week. One capsule once weekly Patient taking differently: Take 50,000 Units by mouth every Tuesday.  10/16/17  Yes Fayrene Helper, MD  gabapentin (NEURONTIN) 300 MG capsule TAKE 1 CAPSULE (300 MG TOTAL) BY MOUTH AT BEDTIME. 12/21/15  Yes Fayrene Helper, MD  guaiFENesin-dextromethorphan (ROBITUSSIN DM) 100-10 MG/5ML syrup Take 5 mLs by mouth every 4 (four) hours as needed for cough. 10/05/18  Yes Davonna Belling, MD  HYDROcodone-acetaminophen (NORCO/VICODIN) 5-325 MG tablet One tablet every six hours as needed for pain.  30 day limit.  09/30/18  Yes Sanjuana Kava, MD  ipratropium (ATROVENT) 0.02 % nebulizer solution Take 2.5 mLs (0.5 mg total) by nebulization every 6 (six) hours as needed for wheezing or shortness of breath. 06/09/18  Yes Fayrene Helper, MD  levalbuterol Penne Lash) 0.63 MG/3ML nebulizer solution Take 3 mLs (0.63 mg total) by nebulization every 8 (eight) hours as needed for wheezing or shortness of breath. 06/09/18  Yes Fayrene Helper, MD  meloxicam (MOBIC) 15 MG tablet TAKE 1 TABLET BY MOUTH  ONCE DAILY AS NEEDED FOR UNCONTROLLED ARTHRITIS PAIN 10/06/18  Yes Fayrene Helper, MD  spironolactone (ALDACTONE) 50 MG tablet TAKE 1 TABLET BY MOUTH EVERY DAY 08/17/18  Yes Fayrene Helper, MD  benzonatate (TESSALON) 100 MG capsule Take 1 capsule (100 mg total) by mouth 2 (two) times daily as needed for cough. Patient not taking: Reported on 10/14/2018 09/09/18   Fayrene Helper, MD  budesonide-formoterol [facility]) 160-4.5 MCG/ACT inhaler Inhale 2 puffs into the lungs 2 (two) times daily. Patient not taking: Reported on 10/14/2018 06/09/18   Fayrene Helper, MD  doxycycline (VIBRAMYCIN) 100 MG capsule Take 1 capsule (100 mg total) by mouth 2 (two) times daily. 10/14/18   Nat Christen, MD  nicotine polacrilex (NICORELIEF) 2 MG gum Week 1-6 Chew one piece every 1-2 hours prn for cravings. Week 7-9- One piece every 2-4 hours prn. Week 10-12-  One piece every 4-6 hrs prn. Patient not taking: Reported on 10/14/2018 06/09/18   Fayrene Helper, MD  pravastatin (PRAVACHOL) 80 MG tablet TAKE 1 TABLET BY MOUTH EVERY DAY Patient not taking: TAKE 1 TABLET BY MOUTH EVERY DAY 07/20/18   Fayrene Helper, MD  predniSONE (DELTASONE) 10 MG tablet 3 tabs for 3 days, 2 tabs for 3 days, 1 tab for 3 days. 10/14/18   Nat Christen, MD  sulfamethoxazole-trimethoprim (BACTRIM DS,SEPTRA DS) 800-160 MG tablet Take 1 tablet by mouth 2 (two) times daily. Patient not taking: Reported on 10/14/2018 09/09/18   Fayrene Helper, MD    Family History Family History  Problem Relation Age of Onset  . Heart attack Mother   . Diabetes Mother   . Hypertension Mother   . Stroke Brother   . Hypertension Sister   . Hypertension Sister   . Hypertension Sister   . Hypertension Brother     Social History Social History   Tobacco Use  . Smoking status: Current Some Day Smoker    Packs/day: 0.75    Years: 48.00    Pack years: 36.00    Types: Cigarettes    Start date: 10/16/1969  . Smokeless tobacco:  Never Used  . Tobacco comment: hasn't smoked in 3 days   Substance Use Topics  . Alcohol use: Yes    Alcohol/week: 0.0 standard drinks    Comment: occassional , max of 72 oz pwr month  . Drug use: No     Allergies   Penicillins   Review of Systems Review of Systems  Unable to perform ROS: Acuity of condition     Physical Exam Updated Vital Signs BP 138/90   Pulse 76   Temp 97.8 F (36.6 C) (Oral)   Resp 12   SpO2 94%   Physical Exam Vitals signs and nursing note reviewed.  Constitutional:      Appearance: He is well-developed.  HENT:     Head: Normocephalic and atraumatic.  Eyes:     Conjunctiva/sclera: Conjunctivae normal.  Neck:     Musculoskeletal: Neck supple.  Cardiovascular:  Rate and Rhythm: Normal rate and regular rhythm.  Pulmonary:     Comments: Slight tachypnea, scattered rhonchi Abdominal:     General: Bowel sounds are normal.     Palpations: Abdomen is soft.  Musculoskeletal: Normal range of motion.  Skin:    General: Skin is warm and dry.  Neurological:     Mental Status: He is alert and oriented to person, place, and time.  Psychiatric:        Behavior: Behavior normal.      ED Treatments / Results  Labs (all labs ordered are listed, but only abnormal results are displayed) Labs Reviewed  CBC WITH DIFFERENTIAL/PLATELET - Abnormal; Notable for the following components:      Result Value   Platelets 416 (*)    Eosinophils Absolute 0.9 (*)    All other components within normal limits  TROPONIN I - Abnormal; Notable for the following components:   Troponin I 0.03 (*)    All other components within normal limits  TROPONIN I - Abnormal; Notable for the following components:   Troponin I 0.06 (*)    All other components within normal limits  COMPREHENSIVE METABOLIC PANEL    EKG EKG Interpretation  Date/Time:  Thursday October 14 2018 08:29:46 EST Ventricular Rate:  69 PR Interval:    QRS Duration: 89 QT Interval:  392 QTC  Calculation: 420 R Axis:   79 Text Interpretation:  Sinus rhythm Confirmed by Nat Christen [phone]) on 10/14/2018 2:36:10 PM   Radiology Dg Chest 2 View  Result Date: 10/14/2018 CLINICAL DATA:  Shortness of breath EXAM: CHEST - 2 VIEW COMPARISON:  10/05/2018 FINDINGS: Cardiac shadows within normal limits. The lungs are hyperinflated consistent with COPD. Bilateral nipple shadows are seen. Parenchymal nodule is again noted in the right lung base. Calcification with associated scarring is noted in the right apex. No new focal infiltrate or sizable effusion is seen. No acute bony abnormality is noted. IMPRESSION: Stable nodular change in the right base and scarring with calcification in the apex. No new focal abnormality is seen. COPD. Electronically Signed   By: Inez Catalina M.D.   On: 10/14/2018 07:49    Procedures Procedures (including critical care time)  Medications Ordered in ED Medications  ipratropium-albuterol (DUONEB) 0.5-2.5 (3) MG/3ML nebulizer solution 3 mL (3 mLs Nebulization Given 10/14/18 0837)  methylPREDNISolone sodium succinate (SOLU-MEDROL) 125 mg/2 mL injection 80 mg (80 mg Intravenous Given 10/14/18 0754)  sodium chloride 0.9 % bolus 500 mL (0 mLs Intravenous Stopped 10/14/18 0911)     Initial Impression / Assessment and Plan / ED Course  I have reviewed the triage vital signs and the nursing notes.  Pertinent labs & imaging results that were available during my care of the patient were reviewed by me and considered in my medical decision making (see chart for details).     Patient presents with dyspnea likely related to a COPD exacerbation.  EKG normal.  Chest x-ray shows stable nodule in right base.  (This was discussed with the patient).  Delta troponins were 0.03 and 0.06.  I discussed these findings with the cardiologist on-call.  He recommended overnight admission.  I recommended this to the patient.  He chose to be discharged.  Patient will follow-up with his  primary care and cardiology.  Final Clinical Impressions(s) / ED Diagnoses   Final diagnoses:  Dyspnea, unspecified type  Elevated troponin    ED Discharge Orders         Ordered  doxycycline (VIBRAMYCIN) 100 MG capsule  2 times daily     10/14/18 1523    predniSONE (DELTASONE) 10 MG tablet     10/14/18 1523           Nat Christen, MD 10/15/18 1055

## 2018-10-21 ENCOUNTER — Other Ambulatory Visit: Payer: Self-pay | Admitting: Family Medicine

## 2018-10-25 ENCOUNTER — Telehealth: Payer: Self-pay | Admitting: *Deleted

## 2018-10-25 NOTE — Telephone Encounter (Signed)
Called and left message on voicemail offering him appt Thursday (had a cancellation) waiting to hear back from patient if he wants this appt or not.

## 2018-10-25 NOTE — Telephone Encounter (Signed)
Mr. Hinderman wanted to be seen this morning but there are no appointments. Wanted to come in for a breathing treatment.

## 2018-10-26 ENCOUNTER — Encounter: Payer: Self-pay | Admitting: *Deleted

## 2018-10-26 NOTE — Telephone Encounter (Signed)
Patient confirmed appt.

## 2018-10-27 ENCOUNTER — Emergency Department (HOSPITAL_COMMUNITY): Payer: Medicaid Other

## 2018-10-27 ENCOUNTER — Encounter (HOSPITAL_COMMUNITY): Payer: Self-pay

## 2018-10-27 ENCOUNTER — Other Ambulatory Visit: Payer: Self-pay

## 2018-10-27 ENCOUNTER — Emergency Department (HOSPITAL_COMMUNITY)
Admission: EM | Admit: 2018-10-27 | Discharge: 2018-10-27 | Disposition: A | Discharge status: Home / Self Care | Payer: Medicaid Other | Attending: Emergency Medicine | Admitting: Emergency Medicine

## 2018-10-27 DIAGNOSIS — Z79899 Other long term (current) drug therapy: Secondary | ICD-10-CM | POA: Diagnosis not present

## 2018-10-27 DIAGNOSIS — J441 Chronic obstructive pulmonary disease with (acute) exacerbation: Secondary | ICD-10-CM

## 2018-10-27 DIAGNOSIS — R0602 Shortness of breath: Secondary | ICD-10-CM | POA: Diagnosis present

## 2018-10-27 DIAGNOSIS — Z7982 Long term (current) use of aspirin: Secondary | ICD-10-CM | POA: Insufficient documentation

## 2018-10-27 DIAGNOSIS — F17210 Nicotine dependence, cigarettes, uncomplicated: Secondary | ICD-10-CM | POA: Insufficient documentation

## 2018-10-27 DIAGNOSIS — I10 Essential (primary) hypertension: Secondary | ICD-10-CM | POA: Insufficient documentation

## 2018-10-27 MED ORDER — PREDNISONE 20 MG PO TABS: 40.0000 mg | ORAL_TABLET | Freq: Every day | ORAL | 0 refills | Status: DC

## 2018-10-27 MED ORDER — IPRATROPIUM-ALBUTEROL 0.5-2.5 (3) MG/3ML IN SOLN
3.0000 mL | Freq: Once | RESPIRATORY_TRACT | Status: AC
  Administered 2018-10-27: 3 mL via RESPIRATORY_TRACT
  Filled 2018-10-27: qty 3

## 2018-10-27 MED ORDER — PREDNISONE 50 MG PO TABS
60.0000 mg | ORAL_TABLET | Freq: Once | ORAL | Status: AC
  Administered 2018-10-27: 60 mg via ORAL
  Filled 2018-10-27: qty 1

## 2018-10-27 MED ORDER — ALBUTEROL SULFATE (2.5 MG/3ML) 0.083% IN NEBU: 2.5000 mg | INHALATION_SOLUTION | RESPIRATORY_TRACT | 3 refills | Status: DC | PRN

## 2018-10-27 MED ORDER — ALBUTEROL SULFATE HFA 108 (90 BASE) MCG/ACT IN AERS
2.0000 | INHALATION_SPRAY | RESPIRATORY_TRACT | Status: DC | PRN
  Administered 2018-10-27: 2 via RESPIRATORY_TRACT
  Filled 2018-10-27: qty 6.7

## 2018-10-27 MED ORDER — ALBUTEROL SULFATE (2.5 MG/3ML) 0.083% IN NEBU
2.5000 mg | INHALATION_SOLUTION | Freq: Once | RESPIRATORY_TRACT | Status: AC
  Administered 2018-10-27: 2.5 mg via RESPIRATORY_TRACT
  Filled 2018-10-27: qty 3

## 2018-10-27 NOTE — ED Provider Notes (Signed)
[facility] EMERGENCY DEPARTMENT Provider Note   CSN: [id number] Arrival date & time: 10/27/18  0229     History   Chief Complaint Chief Complaint  Patient presents with  . Shortness of Breath    HPI Dillon [name] is a 68 y.o. male.  Patient presents to the emergency department for evaluation of shortness of breath.  Patient reports that he just woke up from sleep feeling short of breath.  Patient has a history of COPD.  He reports that for the last couple of days he has had some cold symptoms with cough and congestion, but had not been having any significant shortness of breath until tonight.  He did not have any albuterol for his nebulizer machine so he came to the ER.  He has not had a fever.  No chest pain.     Past Medical History:  Diagnosis Date  . Arthritis    2012  . Asthma    childhood  . COPD (chronic obstructive pulmonary disease) ([place])   . Hyperlipidemia    1990  . Hypertension    1990  . Nicotine addiction   . PAD (peripheral artery disease) ([place]) 2015    Patient Active Problem List   Diagnosis Date Noted  . Abdominal aortic aneurysm (AAA) ([place]) 06/10/2018  . Hyperlipidemia LDL goal <70 04/16/2018  . Atherosclerosis of native arteries of extremity with intermittent claudication ([place]) 01/20/2018  . Vitamin D deficiency 10/16/2017  . Generalized arthritis 10/16/2017  . Elevated PSA 04/30/2016  . Lung nodules 02/22/2015  . CAD (coronary atherosclerotic disease) 04/13/2014  . Acute bronchitis with COPD ([place]) 06/27/2010  . Low back pain with left-sided sciatica 12/19/2008  . NICOTINE ADDICTION 02/24/2008  . Essential hypertension 02/24/2008    Past Surgical History:  Procedure Laterality Date  . KNEE ARTHROSCOPY     both knees   . LOWER EXTREMITY ANGIOGRAPHY N/A 02/04/2018   Procedure: LOWER EXTREMITY ANGIOGRAPHY;  Surgeon: Conrad Hubbardston, MD;  Location: [facility];  Service: Cardiovascular;  Laterality: N/A;        Home Medications     Prior to Admission medications   Medication Sig Start Date End Date Taking? Authorizing Provider  albuterol (PROVENTIL HFA;VENTOLIN HFA) 108 (90 Base) MCG/ACT inhaler Inhale 2 puffs into the lungs every 6 (six) hours as needed for wheezing. 08/30/18 08/30/19  Fayrene Helper, MD  albuterol (PROVENTIL) (2.5 MG/3ML) 0.083% nebulizer solution Take 3 mLs (2.5 mg total) by nebulization every 4 (four) hours as needed for wheezing or shortness of breath. 10/27/18   Orpah Greek, MD  amLODipine (NORVASC) 10 MG tablet TAKE 1 TABLET BY MOUTH EVERY DAY 08/23/18   Fayrene Helper, MD  aspirin EC 81 MG tablet Take 81 mg by mouth daily.    [provider]  benzonatate (TESSALON) 100 MG capsule Take 1 capsule (100 mg total) by mouth 2 (two) times daily as needed for cough. Patient not taking: Reported on 10/14/2018 09/09/18   Fayrene Helper, MD  budesonide-formoterol [facility]) 160-4.5 MCG/ACT inhaler Inhale 2 puffs into the lungs 2 (two) times daily. Patient not taking: Reported on 10/14/2018 06/09/18   Fayrene Helper, MD  cilostazol (PLETAL) 50 MG tablet Take 1 tablet (50 mg total) by mouth 2 (two) times daily. 12/03/17   Arnoldo Lenis, MD  cyclobenzaprine (FLEXERIL) 10 MG tablet Take 1 tablet (10 mg total) by mouth at bedtime. One tablet every night at bedtime as needed for spasm. 06/17/18  Sanjuana Kava, MD  doxycycline (VIBRAMYCIN) 100 MG capsule Take 1 capsule (100 mg total) by mouth 2 (two) times daily. 10/05/18   Davonna Belling, MD  doxycycline (VIBRAMYCIN) 100 MG capsule Take 1 capsule (100 mg total) by mouth 2 (two) times daily. 10/14/18   Nat Christen, MD  ergocalciferol (VITAMIN D2) 50000 units capsule Take 1 capsule (50,000 Units total) by mouth once a week. One capsule once weekly Patient taking differently: Take 50,000 Units by mouth every Tuesday.  10/16/17   Fayrene Helper, MD  gabapentin (NEURONTIN) 300 MG capsule TAKE 1 CAPSULE (300 MG TOTAL) BY  MOUTH AT BEDTIME. 12/21/15   Fayrene Helper, MD  guaiFENesin-dextromethorphan (ROBITUSSIN DM) 100-10 MG/5ML syrup Take 5 mLs by mouth every 4 (four) hours as needed for cough. 10/05/18   Davonna Belling, MD  HYDROcodone-acetaminophen (NORCO/VICODIN) 5-325 MG tablet One tablet every six hours as needed for pain.  30 day limit. 09/30/18   Sanjuana Kava, MD  ipratropium (ATROVENT) 0.02 % nebulizer solution Take 2.5 mLs (0.5 mg total) by nebulization every 6 (six) hours as needed for wheezing or shortness of breath. 06/09/18   Fayrene Helper, MD  levalbuterol Penne Lash) 0.63 MG/3ML nebulizer solution Take 3 mLs (0.63 mg total) by nebulization every 8 (eight) hours as needed for wheezing or shortness of breath. 06/09/18   Fayrene Helper, MD  meloxicam (MOBIC) 15 MG tablet TAKE 1 TABLET BY MOUTH ONCE DAILY AS NEEDED FOR UNCONTROLLED ARTHRITIS PAIN 10/06/18   Fayrene Helper, MD  nicotine polacrilex (NICORELIEF) 2 MG gum Week 1-6 Chew one piece every 1-2 hours prn for cravings. Week 7-9- One piece every 2-4 hours prn. Week 10-12-  One piece every 4-6 hrs prn. Patient not taking: Reported on 10/14/2018 06/09/18   Fayrene Helper, MD  pravastatin (PRAVACHOL) 80 MG tablet TAKE 1 TABLET BY MOUTH EVERY DAY 10/21/18   Fayrene Helper, MD  predniSONE (DELTASONE) 20 MG tablet Take 2 tablets (40 mg total) by mouth daily with breakfast. 10/27/18   Orpah Greek, MD  spironolactone (ALDACTONE) 50 MG tablet TAKE 1 TABLET BY MOUTH EVERY DAY 08/17/18   Fayrene Helper, MD  sulfamethoxazole-trimethoprim (BACTRIM DS,SEPTRA DS) 800-160 MG tablet Take 1 tablet by mouth 2 (two) times daily. Patient not taking: Reported on 10/14/2018 09/09/18   Fayrene Helper, MD    Family History Family History  Problem Relation Age of Onset  . Heart attack Mother   . Diabetes Mother   . Hypertension Mother   . Stroke Brother   . Hypertension Sister   . Hypertension Sister   . Hypertension  Sister   . Hypertension Brother     Social History Social History   Tobacco Use  . Smoking status: Current Some Day Smoker    Packs/day: 0.75    Years: 48.00    Pack years: 36.00    Types: Cigarettes    Start date: 10/16/1969  . Smokeless tobacco: Never Used  . Tobacco comment: hasn't smoked in 3 days   Substance Use Topics  . Alcohol use: Yes    Alcohol/week: 0.0 standard drinks    Comment: occassional , max of 72 oz pwr month  . Drug use: No     Allergies   Penicillins   Review of Systems Review of Systems  Respiratory: Positive for cough, shortness of breath and wheezing.   All other systems reviewed and are negative.    Physical Exam Updated Vital Signs BP 137/83   Pulse  74   Temp 97.7 F (36.5 C) (Oral)   Resp 17   Ht 6' 2.5" (1.892 m)   Wt 73.5 kg   SpO2 95%   BMI 20.52 kg/m   Physical Exam Vitals signs and nursing note reviewed.  Constitutional:      General: He is not in acute distress.    Appearance: Normal appearance. He is well-developed.  HENT:     Head: Normocephalic and atraumatic.     Right Ear: Hearing normal.     Left Ear: Hearing normal.     Nose: Nose normal.  Eyes:     Conjunctiva/sclera: Conjunctivae normal.     Pupils: Pupils are equal, round, and reactive to light.  Neck:     Musculoskeletal: Normal range of motion and neck supple.  Cardiovascular:     Rate and Rhythm: Regular rhythm.     Heart sounds: S1 normal and S2 normal. No murmur. No friction rub. No gallop.   Pulmonary:     Effort: Accessory muscle usage present. No respiratory distress.     Breath sounds: Decreased air movement present. Wheezing present.  Chest:     Chest wall: No tenderness.  Abdominal:     General: Bowel sounds are normal.     Palpations: Abdomen is soft.     Tenderness: There is no abdominal tenderness. There is no guarding or rebound. Negative signs include Bouwman's sign and McBurney's sign.     Hernia: No hernia is present.   Musculoskeletal: Normal range of motion.  Skin:    General: Skin is warm and dry.     Findings: No rash.  Neurological:     Mental Status: He is alert and oriented to person, place, and time.     GCS: GCS eye subscore is 4. GCS verbal subscore is 5. GCS motor subscore is 6.     Cranial Nerves: No cranial nerve deficit.     Sensory: No sensory deficit.     Coordination: Coordination normal.  Psychiatric:        Speech: Speech normal.        Behavior: Behavior normal.        Thought Content: Thought content normal.      ED Treatments / Results  Labs (all labs ordered are listed, but only abnormal results are displayed) Labs Reviewed - No data to display  EKG None  Radiology Dg Chest 2 View  Result Date: 10/27/2018 CLINICAL DATA:  Acute onset of difficulty breathing. Productive cough. EXAM: CHEST - 2 VIEW COMPARISON:  Chest radiograph performed 10/14/2018, and CT of the chest performed 05/31/2018 FINDINGS: The lungs are hyperexpanded, with flattening of the hemidiaphragms, reflecting COPD. Scarring is again noted at the right lung apex, with an associated calcified granuloma. The known stable right-sided pulmonary nodules are not well characterized on radiograph. No focal consolidation, pleural effusion or pneumothorax is seen. The heart is normal in size. No acute osseous abnormalities are identified. IMPRESSION: Findings of COPD. No acute cardiopulmonary process seen. Electronically Signed   By: Garald Balding M.D.   On: 10/27/2018 03:27    Procedures Procedures (including critical care time)  Medications Ordered in ED Medications  albuterol (PROVENTIL HFA;VENTOLIN HFA) 108 (90 Base) MCG/ACT inhaler 2 puff (2 puffs Inhalation Given 10/27/18 0356)  ipratropium-albuterol (DUONEB) 0.5-2.5 (3) MG/3ML nebulizer solution 3 mL (3 mLs Nebulization Given 10/27/18 0244)  albuterol (PROVENTIL) (2.5 MG/3ML) 0.083% nebulizer solution 2.5 mg (2.5 mg Nebulization Given 10/27/18 0244)  predniSONE  (DELTASONE) tablet 60 mg (  60 mg Oral Given 10/27/18 0407)     Initial Impression / Assessment and Plan / ED Course  I have reviewed the triage vital signs and the nursing notes.  Pertinent labs & imaging results that were available during my care of the patient were reviewed by me and considered in my medical decision making (see chart for details).     Patient presents to the emergency room for evaluation of difficulty breathing.  Patient has a history of COPD.  He is currently out of his albuterol.  He reports that he has been feeling fine all day and woke up short of breath tonight.  He has had some minor cold symptoms over the last couple of days, no fever.  Chest x-ray is clear.  Patient was wheezing upon arrival, this resolved with albuterol.  Patient given an inhaler at discharge.  Initiated on prednisone, refilled his albuterol nebulizer solution.  Final Clinical Impressions(s) / ED Diagnoses   Final diagnoses:  COPD exacerbation [facility])    ED Discharge Orders         Ordered    predniSONE (DELTASONE) 20 MG tablet  Daily with breakfast     10/27/18 0342    albuterol (PROVENTIL) (2.5 MG/3ML) 0.083% nebulizer solution  Every 4 hours PRN     10/27/18 0342           Orpah Greek, MD 10/27/18 [phone]

## 2018-10-27 NOTE — ED Triage Notes (Signed)
Pt states he awoke sob, has run out of his neb meds.  Pt denies pain or other complaints.

## 2018-10-28 ENCOUNTER — Ambulatory Visit: Payer: Medicaid Other | Admitting: Orthopaedic Surgery

## 2018-10-28 ENCOUNTER — Ambulatory Visit (INDEPENDENT_AMBULATORY_CARE_PROVIDER_SITE_OTHER): Payer: Medicaid Other | Admitting: Family Medicine

## 2018-10-28 ENCOUNTER — Encounter: Payer: Self-pay | Admitting: Orthopaedic Surgery

## 2018-10-28 ENCOUNTER — Encounter: Payer: Self-pay | Admitting: Family Medicine

## 2018-10-28 VITALS — BP 120/70 | HR 82 | Resp 16 | Ht 75.0 in | Wt 161.0 lb

## 2018-10-28 VITALS — BP 145/92 | HR 87 | Ht 75.0 in | Wt 160.0 lb

## 2018-10-28 DIAGNOSIS — F17210 Nicotine dependence, cigarettes, uncomplicated: Secondary | ICD-10-CM

## 2018-10-28 DIAGNOSIS — F17200 Nicotine dependence, unspecified, uncomplicated: Secondary | ICD-10-CM

## 2018-10-28 DIAGNOSIS — M5442 Lumbago with sciatica, left side: Secondary | ICD-10-CM

## 2018-10-28 DIAGNOSIS — G8929 Other chronic pain: Secondary | ICD-10-CM

## 2018-10-28 DIAGNOSIS — Z09 Encounter for follow-up examination after completed treatment for conditions other than malignant neoplasm: Secondary | ICD-10-CM | POA: Diagnosis not present

## 2018-10-28 DIAGNOSIS — I10 Essential (primary) hypertension: Secondary | ICD-10-CM | POA: Diagnosis not present

## 2018-10-28 MED ORDER — FLUTICASONE-SALMETEROL 100-50 MCG/DOSE IN AEPB: 1.0000 | INHALATION_SPRAY | Freq: Two times a day (BID) | RESPIRATORY_TRACT | 5 refills | Status: DC

## 2018-10-28 MED ORDER — HYDROCODONE-ACETAMINOPHEN 5-325 MG PO TABS: ORAL_TABLET | ORAL | 0 refills | Status: DC

## 2018-10-28 MED ORDER — UNABLE TO FIND: 0 refills | Status: AC

## 2018-10-28 MED ORDER — PREDNISONE 10 MG (21) PO TBPK: ORAL_TABLET | ORAL | 0 refills | Status: AC

## 2018-10-28 NOTE — Progress Notes (Signed)
[name], male DOB:[birth year], 68 y.o. EYC:[id number]  Chief Complaint  Patient presents with  . Back Pain    HPI  Dillon [name] is a 68 y.o. male who has chronic lower back pain.  He has had some increased pain with the cold weather.  He has no new trauma.  He was in the hospital recently for his lung problem.  He smokes and I have told him to cut back.  He says he is trying.  He is taking his medicine and doing his exercises.   Body mass index is 20 kg/m.  ROS  Review of Systems  Respiratory: Positive for shortness of breath. Negative for cough.   Cardiovascular: Negative for chest pain and leg swelling.  Endocrine: Negative for cold intolerance.  Musculoskeletal: Positive for arthralgias and myalgias.  Allergic/Immunologic: Positive for environmental allergies.  All other systems reviewed and are negative.   All other systems reviewed and are negative.  The following is a summary of the past history medically, past history surgically, known current medicines, social history and family history.  This information is gathered electronically by the computer from prior information and documentation.  I review this each visit and have found including this information at this point in the chart is beneficial and informative.    Past Medical History:  Diagnosis Date  . Arthritis    2012  . Asthma    childhood  . COPD (chronic obstructive pulmonary disease) ([place])   . Hyperlipidemia    1990  . Hypertension    1990  . Nicotine addiction   . PAD (peripheral artery disease) ([place]) 2015    Past Surgical History:  Procedure Laterality Date  . KNEE ARTHROSCOPY     both knees   . LOWER EXTREMITY ANGIOGRAPHY N/A 02/04/2018   Procedure: LOWER EXTREMITY ANGIOGRAPHY;  Surgeon: Conrad [place], MD;  Location: [place] [facility];  Service: Cardiovascular;  Laterality: N/A;    Family History  Problem Relation Age of Onset  . Heart attack Mother   . Diabetes Mother    . Hypertension Mother   . Stroke Brother   . Hypertension Sister   . Hypertension Sister   . Hypertension Sister   . Hypertension Brother     Social History Social History   Tobacco Use  . Smoking status: Current Some Day Smoker    Packs/day: 0.75    Years: 48.00    Pack years: 36.00    Types: Cigarettes    Start date: 10/16/1969  . Smokeless tobacco: Never Used  . Tobacco comment: hasn't smoked in 3 days   Substance Use Topics  . Alcohol use: Yes    Alcohol/week: 0.0 standard drinks    Comment: occassional , max of 72 oz pwr month  . Drug use: No    Allergies  Allergen Reactions  . Penicillins Hives and Other (See Comments)    Has patient had a PCN reaction causing immediate rash, facial/tongue/throat swelling, SOB or lightheadedness with hypotension: Unknown Has patient had a PCN reaction causing severe rash involving mucus membranes or skin necrosis: No Has patient had a PCN reaction that required hospitalization: Yes Has patient had a PCN reaction occurring within the last 10 years: No If all of the above answers are "NO", then may proceed with Cephalosporin use.     Current Outpatient Medications  Medication Sig Dispense Refill  . albuterol (PROVENTIL HFA;VENTOLIN HFA) 108 (90 Base) MCG/ACT inhaler Inhale 2 puffs into the lungs every 6 (  six) hours as needed for wheezing. 1 Inhaler 3  . albuterol (PROVENTIL) (2.5 MG/3ML) 0.083% nebulizer solution Take 3 mLs (2.5 mg total) by nebulization every 4 (four) hours as needed for wheezing or shortness of breath. 75 mL 3  . amLODipine (NORVASC) 10 MG tablet TAKE 1 TABLET BY MOUTH EVERY DAY 90 tablet 1  . aspirin EC 81 MG tablet Take 81 mg by mouth daily.    . benzonatate (TESSALON) 100 MG capsule Take 1 capsule (100 mg total) by mouth 2 (two) times daily as needed for cough. (Patient not taking: Reported on 10/14/2018) 14 capsule 0  . budesonide-formoterol (SYMBICORT) 160-4.5 MCG/ACT inhaler Inhale 2 puffs into the lungs 2  (two) times daily. (Patient not taking: Reported on 10/14/2018) 10.2 Inhaler 5  . cilostazol (PLETAL) 50 MG tablet Take 1 tablet (50 mg total) by mouth 2 (two) times daily. 60 tablet 6  . cyclobenzaprine (FLEXERIL) 10 MG tablet Take 1 tablet (10 mg total) by mouth at bedtime. One tablet every night at bedtime as needed for spasm. 30 tablet 2  . doxycycline (VIBRAMYCIN) 100 MG capsule Take 1 capsule (100 mg total) by mouth 2 (two) times daily. 10 capsule 0  . doxycycline (VIBRAMYCIN) 100 MG capsule Take 1 capsule (100 mg total) by mouth 2 (two) times daily. 20 capsule 0  . ergocalciferol (VITAMIN D2) 50000 units capsule Take 1 capsule (50,000 Units total) by mouth once a week. One capsule once weekly (Patient taking differently: Take 50,000 Units by mouth every Tuesday. ) 12 capsule 3  . gabapentin (NEURONTIN) 300 MG capsule TAKE 1 CAPSULE (300 MG TOTAL) BY MOUTH AT BEDTIME. 30 capsule 2  . guaiFENesin-dextromethorphan (ROBITUSSIN DM) 100-10 MG/5ML syrup Take 5 mLs by mouth every 4 (four) hours as needed for cough. 118 mL 0  . HYDROcodone-acetaminophen (NORCO/VICODIN) 5-325 MG tablet One tablet every six hours as needed for pain. 100 tablet 0  . ipratropium (ATROVENT) 0.02 % nebulizer solution Take 2.5 mLs (0.5 mg total) by nebulization every 6 (six) hours as needed for wheezing or shortness of breath. 75 mL 12  . levalbuterol (XOPENEX) 0.63 MG/3ML nebulizer solution Take 3 mLs (0.63 mg total) by nebulization every 8 (eight) hours as needed for wheezing or shortness of breath. 3 mL 12  . meloxicam (MOBIC) 15 MG tablet TAKE 1 TABLET BY MOUTH ONCE DAILY AS NEEDED FOR UNCONTROLLED ARTHRITIS PAIN 90 tablet 0  . nicotine polacrilex (NICORELIEF) 2 MG gum Week 1-6 Chew one piece every 1-2 hours prn for cravings. Week 7-9- One piece every 2-4 hours prn. Week 10-12-  One piece every 4-6 hrs prn. (Patient not taking: Reported on 10/14/2018) 100 tablet 1  . pravastatin (PRAVACHOL) 80 MG tablet TAKE 1 TABLET BY  MOUTH EVERY DAY 90 tablet 2  . predniSONE (DELTASONE) 20 MG tablet Take 2 tablets (40 mg total) by mouth daily with breakfast. 10 tablet 0  . spironolactone (ALDACTONE) 50 MG tablet TAKE 1 TABLET BY MOUTH EVERY DAY 90 tablet 1  . sulfamethoxazole-trimethoprim (BACTRIM DS,SEPTRA DS) 800-160 MG tablet Take 1 tablet by mouth 2 (two) times daily. (Patient not taking: Reported on 10/14/2018) 14 tablet 0   No current facility-administered medications for this visit.      Physical Exam  Blood pressure (!) 145/92, pulse 87, height 6\' 3"  (1.905 m), weight 160 lb (72.6 kg).  Constitutional: overall normal hygiene, normal nutrition, well developed, normal grooming, normal body habitus. Assistive device:none  Musculoskeletal: gait and station Limp none, muscle tone and  strength are normal, no tremors or atrophy is present.  .  Neurological: coordination overall normal.  Deep tendon reflex/nerve stretch intact.  Sensation normal.  Cranial nerves II-XII intact.   Skin:   Normal overall no scars, lesions, ulcers or rashes. No psoriasis.  Psychiatric: Alert and oriented x 3.  Recent memory intact, remote memory unclear.  Normal mood and affect. Well groomed.  Good eye contact.  Cardiovascular: overall no swelling, no varicosities, no edema bilaterally, normal temperatures of the legs and arms, no clubbing, cyanosis and good capillary refill.  Lymphatic: palpation is normal.  Spine/Pelvis examination:  Inspection:  Overall, sacoiliac joint benign and hips nontender; without crepitus or defects.   Thoracic spine inspection: Alignment normal without kyphosis present   Lumbar spine inspection:  Alignment  with normal lumbar lordosis, without scoliosis apparent.   Thoracic spine palpation:  without tenderness of spinal processes   Lumbar spine palpation: without tenderness of lumbar area; without tightness of lumbar muscles    Range of Motion:   Lumbar flexion, forward flexion is normal without  pain or tenderness    Lumbar extension is full without pain or tenderness   Left lateral bend is normal without pain or tenderness   Right lateral bend is normal without pain or tenderness   Straight leg raising is normal  Strength & tone: normal   Stability overall normal stability  All other systems reviewed and are negative   The patient has been educated about the nature of the problem(s) and counseled on treatment options.  The patient appeared to understand what I have discussed and is in agreement with it.  Encounter Diagnoses  Name Primary?  . Chronic midline low back pain with left-sided sciatica Yes  . Cigarette nicotine dependence without complication     PLAN Call if any problems.  Precautions discussed.  Continue current medications.   Return to clinic 1 month   I have reviewed the [facility] web site prior to prescribing narcotic medicine for this patient.   Electronically Signed Sanjuana Kava, MD 1/2/20208:30 AM

## 2018-10-28 NOTE — Patient Instructions (Signed)
F/U in April as before, call if you need me sooner   NEED to use advair every day to reduce flare up of wheezing  Take prednisone dose pack starting next Monday after you finish the medication you got in the eD   Congrats on NOP smoke x 3 days, DO NOT START SMOKING again , need to stay QUIT  Thank you  for choosing [facility]. We consider it a privelige to serve you.  Delivering excellent health care in a caring and  compassionate way is our goal.  Partnering with you,  so that together we can achieve this goal is our strategy.

## 2018-10-31 NOTE — Progress Notes (Signed)
   Dillon [name]     MRN: [id number]      DOB: Jul 02, 1951   HPI Dillon [name] is here for follow up and re-evaluation following ED visit 1 day ago because of COPD flare. States he didi not use  His neb med at home as this does not work, however , after being treated in the ED he feels better and is going to use script sent from the ED This is his 3rd ED visit for COPD exacerbation since 10/05/2018, hence will benefit from  [facility] involvement in his management ,non compliance and his insistence that the neb script "does not work" has been a longstanding complaint ROS Denies recent fever or chills. Denies sinus pressure, nasal congestion, ear pain or sore throat.  Denies chest pains, palpitations and leg swelling Denies abdominal pain, nausea, vomiting,diarrhea or constipation.   Denies dysuria, frequency, hesitancy or incontinence. Denies joint pain, swelling and limitation in mobility. Denies headaches, seizures, numbness, or tingling. Denies depression, anxiety or insomnia. Denies skin break down or rash.   PE  BP 120/70   Pulse 82   Resp 16   Ht 6\' 3"  (1.905 m)   Wt 161 lb (73 kg)   SpO2 98%   BMI 20.12 kg/m   Patient alert and oriented and in no cardiopulmonary distress.  HEENT: No facial asymmetry, EOMI,   oropharynx pink and moist.  Neck supple no JVD, no mass.  Chest: decreased air entry with bilateral wheezes  CVS: S1, S2 no murmurs, no S3.Regular rate.  ABD: Soft non tender.   Ext: No edema  MS: Decreased  ROM spine, adequate in shoulders, hips and knees.  Skin: Intact, no ulcerations or rash noted.  Psych: Good eye contact, normal affect. Memory intact not anxious or depressed appearing.  CNS: CN 2-12 intact, power,  normal throughout.no focal deficits noted.   Assessment & Plan  Encounter for examination following treatment at hospital Frequent recurrence of COPD flare, re educated re need to take daily preventive medication and also to use neb solution at  home during flare If has another ED visit in next 1 month, will refer to [facility],   [place] Asked:confirms currently smokes cigarettes Assess: Unwilling to quit but cutting back Advise: needs to QUIT to reduce risk of cancer, cardio and cerebrovascular disease, also COPD progressing to failure Assist: counseled for 5 minutes and literature provided Arrange: follow up in 3 months   Essential hypertension Controlled, no change in medication DASH diet and commitment to daily physical activity for a minimum of 30 minutes discussed and encouraged, as a part of hypertension management. The importance of attaining a healthy weight is also discussed.  BP/Weight 10/28/2018 10/28/2018 10/27/2018 10/14/2018 10/07/2018 10/05/2018 28/36/6294  Systolic BP 765 465 035 465 681 275 170  Diastolic BP 92 70 83 90 86 94 82  Wt. (Lbs) 160 161 162 - 159 155 155  BMI 20 20.12 20.52 - 19.87 19.37 19.37

## 2018-10-31 NOTE — Assessment & Plan Note (Signed)
Frequent recurrence of COPD flare, re educated re need to take daily preventive medication and also to use neb solution at home during flare If has another ED visit in next 1 month, will refer to [facility],

## 2018-10-31 NOTE — Assessment & Plan Note (Signed)
Controlled, no change in medication DASH diet and commitment to daily physical activity for a minimum of 30 minutes discussed and encouraged, as a part of hypertension management. The importance of attaining a healthy weight is also discussed.  BP/Weight 10/28/2018 10/28/2018 10/27/2018 10/14/2018 10/07/2018 10/05/2018 94/70/7615  Systolic BP 183 437 357 897 847 841 282  Diastolic BP 92 70 83 90 86 94 82  Wt. (Lbs) 160 161 162 - 159 155 155  BMI 20 20.12 20.52 - 19.87 19.37 19.37

## 2018-10-31 NOTE — Assessment & Plan Note (Signed)
Asked:confirms currently smokes cigarettes Assess: Unwilling to quit but cutting back Advise: needs to QUIT to reduce risk of cancer, cardio and cerebrovascular disease, also COPD progressing to failure Assist: counseled for 5 minutes and literature provided Arrange: follow up in 3 months

## 2018-11-04 ENCOUNTER — Ambulatory Visit: Payer: Medicare Other | Admitting: Orthopaedic Surgery

## 2018-11-07 ENCOUNTER — Emergency Department (HOSPITAL_COMMUNITY)
Admission: EM | Admit: 2018-11-07 | Discharge: 2018-11-07 | Disposition: A | Discharge status: Home / Self Care | Payer: Medicaid Other | Attending: Emergency Medicine | Admitting: Emergency Medicine

## 2018-11-07 ENCOUNTER — Emergency Department (HOSPITAL_COMMUNITY): Payer: Medicaid Other

## 2018-11-07 ENCOUNTER — Encounter (HOSPITAL_COMMUNITY): Payer: Self-pay

## 2018-11-07 ENCOUNTER — Other Ambulatory Visit: Payer: Self-pay

## 2018-11-07 DIAGNOSIS — F17210 Nicotine dependence, cigarettes, uncomplicated: Secondary | ICD-10-CM | POA: Diagnosis not present

## 2018-11-07 DIAGNOSIS — Z79899 Other long term (current) drug therapy: Secondary | ICD-10-CM | POA: Insufficient documentation

## 2018-11-07 DIAGNOSIS — Z7982 Long term (current) use of aspirin: Secondary | ICD-10-CM | POA: Diagnosis not present

## 2018-11-07 DIAGNOSIS — R0602 Shortness of breath: Secondary | ICD-10-CM | POA: Diagnosis present

## 2018-11-07 DIAGNOSIS — I10 Essential (primary) hypertension: Secondary | ICD-10-CM | POA: Diagnosis not present

## 2018-11-07 DIAGNOSIS — I2510 Atherosclerotic heart disease of native coronary artery without angina pectoris: Secondary | ICD-10-CM | POA: Diagnosis not present

## 2018-11-07 DIAGNOSIS — J441 Chronic obstructive pulmonary disease with (acute) exacerbation: Secondary | ICD-10-CM | POA: Diagnosis not present

## 2018-11-07 LAB — CBC WITH DIFFERENTIAL/PLATELET
Abs Immature Granulocytes: 0.06 10*3/uL (ref 0.00–0.07)
Basophils Absolute: 0 10*3/uL (ref 0.0–0.1)
Basophils Relative: 1 %
Eosinophils Absolute: 0.9 10*3/uL — ABNORMAL HIGH (ref 0.0–0.5)
Eosinophils Relative: 11 %
HCT: 41.1 % (ref 39.0–52.0)
Hemoglobin: 13.4 g/dL (ref 13.0–17.0)
Immature Granulocytes: 1 %
Lymphocytes Relative: 36 %
Lymphs Abs: 2.9 10*3/uL (ref 0.7–4.0)
MCH: 30.9 pg (ref 26.0–34.0)
MCHC: 32.6 g/dL (ref 30.0–36.0)
MCV: 94.9 fL (ref 80.0–100.0)
Monocytes Absolute: 0.6 10*3/uL (ref 0.1–1.0)
Monocytes Relative: 7 %
Neutro Abs: 3.5 10*3/uL (ref 1.7–7.7)
Neutrophils Relative %: 44 %
Platelets: 356 10*3/uL (ref 150–400)
RBC: 4.33 MIL/uL (ref 4.22–5.81)
RDW: 15.4 % (ref 11.5–15.5)
WBC: 7.9 10*3/uL (ref 4.0–10.5)
nRBC: 0 % (ref 0.0–0.2)

## 2018-11-07 LAB — COMPREHENSIVE METABOLIC PANEL
ALT: 24 U/L (ref 0–44)
AST: 26 U/L (ref 15–41)
Albumin: 4.2 g/dL (ref 3.5–5.0)
Alkaline Phosphatase: 56 U/L (ref 38–126)
Anion gap: 8 (ref 5–15)
BUN: 16 mg/dL (ref 8–23)
CO2: 26 mmol/L (ref 22–32)
Calcium: 8.9 mg/dL (ref 8.9–10.3)
Chloride: 103 mmol/L (ref 98–111)
Creatinine, Ser: 0.89 mg/dL (ref 0.61–1.24)
GFR calc Af Amer: >60 mL/min (ref >60)
GFR calc non Af Amer: >60 mL/min (ref >60)
Glucose, Bld: 99 mg/dL (ref 70–99)
Potassium: 3.6 mmol/L (ref 3.5–5.1)
Sodium: 137 mmol/L (ref 135–145)
Total Bilirubin: 0.8 mg/dL (ref 0.3–1.2)
Total Protein: 7.5 g/dL (ref 6.5–8.1)

## 2018-11-07 LAB — TROPONIN I: Troponin I: <0.03 ng/mL (ref <0.03)

## 2018-11-07 LAB — BRAIN NATRIURETIC PEPTIDE: B Natriuretic Peptide: 23 pg/mL (ref 0.0–100.0)

## 2018-11-07 MED ORDER — MAGNESIUM SULFATE 2 GM/50ML IV SOLN
2.0000 g | Freq: Once | INTRAVENOUS | Status: AC
  Administered 2018-11-07: 2 g via INTRAVENOUS
  Filled 2018-11-07: qty 50

## 2018-11-07 MED ORDER — PREDNISONE 10 MG PO TABS: 20.0000 mg | ORAL_TABLET | Freq: Every day | ORAL | 0 refills | Status: DC

## 2018-11-07 MED ORDER — IPRATROPIUM-ALBUTEROL 0.5-2.5 (3) MG/3ML IN SOLN: 3.0000 mL | Freq: Once | RESPIRATORY_TRACT | Status: DC

## 2018-11-07 MED ORDER — IPRATROPIUM-ALBUTEROL 0.5-2.5 (3) MG/3ML IN SOLN
3.0000 mL | Freq: Once | RESPIRATORY_TRACT | Status: AC
  Administered 2018-11-07: 3 mL via RESPIRATORY_TRACT
  Filled 2018-11-07: qty 3

## 2018-11-07 MED ORDER — ALBUTEROL SULFATE (2.5 MG/3ML) 0.083% IN NEBU: 2.5000 mg | INHALATION_SOLUTION | Freq: Once | RESPIRATORY_TRACT | Status: DC

## 2018-11-07 MED ORDER — ALBUTEROL SULFATE (2.5 MG/3ML) 0.083% IN NEBU
2.5000 mg | INHALATION_SOLUTION | Freq: Once | RESPIRATORY_TRACT | Status: AC
  Administered 2018-11-07: 2.5 mg via RESPIRATORY_TRACT
  Filled 2018-11-07: qty 3

## 2018-11-07 NOTE — ED Provider Notes (Signed)
[facility] EMERGENCY DEPARTMENT Provider Note   CSN: [id number] Arrival date & time: 11/07/18  0859     History   Chief Complaint Chief Complaint  Patient presents with  . Shortness of Breath    HPI Dillon [name] is a 68 y.o. male.  Patient complains of shortness of breath.  Patient has a history of COPD.  No productive cough  The history is provided by the patient.  Shortness of Breath  Severity:  Moderate Onset quality:  Sudden Duration:  2 days Timing:  Constant Progression:  Unchanged Chronicity:  Recurrent Context: activity   Relieved by:  Nothing Worsened by:  Nothing Ineffective treatments:  None tried Associated symptoms: no abdominal pain, no chest pain, no cough, no headaches and no rash     Past Medical History:  Diagnosis Date  . Arthritis    2012  . Asthma    childhood  . COPD (chronic obstructive pulmonary disease) ([place])   . Hyperlipidemia    1990  . Hypertension    1990  . Nicotine addiction   . PAD (peripheral artery disease) ([place]) 2015    Patient Active Problem List   Diagnosis Date Noted  . Abdominal aortic aneurysm (AAA) ([place]) 06/10/2018  . Encounter for examination following treatment at hospital 06/09/2018  . Hyperlipidemia LDL goal <70 04/16/2018  . Atherosclerosis of native arteries of extremity with intermittent claudication ([place]) 01/20/2018  . Vitamin D deficiency 10/16/2017  . Generalized arthritis 10/16/2017  . Elevated PSA 04/30/2016  . Lung nodules 02/22/2015  . CAD (coronary atherosclerotic disease) 04/13/2014  . Acute bronchitis with COPD ([place]) 06/27/2010  . Low back pain with left-sided sciatica 12/19/2008  . NICOTINE ADDICTION 02/24/2008  . Essential hypertension 02/24/2008    Past Surgical History:  Procedure Laterality Date  . KNEE ARTHROSCOPY     both knees   . LOWER EXTREMITY ANGIOGRAPHY N/A 02/04/2018   Procedure: LOWER EXTREMITY ANGIOGRAPHY;  Surgeon: Conrad [place], MD;  Location: [place] [facility];   Service: Cardiovascular;  Laterality: N/A;        Home Medications    Prior to Admission medications   Medication Sig Start Date End Date Taking? Authorizing Provider  albuterol (PROVENTIL HFA;VENTOLIN HFA) 108 (90 Base) MCG/ACT inhaler Inhale 2 puffs into the lungs every 6 (six) hours as needed for wheezing. 08/30/18 08/30/19 Yes Fayrene Helper, MD  albuterol (PROVENTIL) (2.5 MG/3ML) 0.083% nebulizer solution Take 3 mLs (2.5 mg total) by nebulization every 4 (four) hours as needed for wheezing or shortness of breath. 10/27/18  Yes Orpah Greek, MD  amLODipine (NORVASC) 10 MG tablet TAKE 1 TABLET BY MOUTH EVERY DAY 08/23/18  Yes Fayrene Helper, MD  aspirin EC 81 MG tablet Take 81 mg by mouth daily.   Yes [provider]  cyclobenzaprine (FLEXERIL) 10 MG tablet Take 1 tablet (10 mg total) by mouth at bedtime. One tablet every night at bedtime as needed for spasm. 06/17/18  Yes Sanjuana Kava, MD  ergocalciferol (VITAMIN D2) 50000 units capsule Take 1 capsule (50,000 Units total) by mouth once a week. One capsule once weekly Patient taking differently: Take 50,000 Units by mouth every Tuesday.  10/16/17  Yes Fayrene Helper, MD  Fluticasone-Salmeterol (ADVAIR DISKUS) 100-50 MCG/DOSE AEPB Inhale 1 puff into the lungs 2 (two) times daily. 10/28/18  Yes Fayrene Helper, MD  gabapentin (NEURONTIN) 300 MG capsule TAKE 1 CAPSULE (300 MG TOTAL) BY MOUTH AT BEDTIME. 12/21/15  Yes Fayrene Helper, MD  guaiFENesin-dextromethorphan (ROBITUSSIN DM) 100-10 MG/5ML syrup Take 5 mLs by mouth every 4 (four) hours as needed for cough. 10/05/18  Yes Davonna Belling, MD  HYDROcodone-acetaminophen (NORCO/VICODIN) 5-325 MG tablet One tablet every six hours as needed for pain. 10/28/18  Yes Sanjuana Kava, MD  ipratropium (ATROVENT) 0.02 % nebulizer solution Take 2.5 mLs (0.5 mg total) by nebulization every 6 (six) hours as needed for wheezing or shortness of breath. 06/09/18  Yes  Fayrene Helper, MD  levalbuterol Penne Lash) 0.63 MG/3ML nebulizer solution Take 3 mLs (0.63 mg total) by nebulization every 8 (eight) hours as needed for wheezing or shortness of breath. 06/09/18  Yes Fayrene Helper, MD  meloxicam (MOBIC) 15 MG tablet TAKE 1 TABLET BY MOUTH ONCE DAILY AS NEEDED FOR UNCONTROLLED ARTHRITIS PAIN 10/06/18  Yes Fayrene Helper, MD  pravastatin (PRAVACHOL) 80 MG tablet TAKE 1 TABLET BY MOUTH EVERY DAY 10/21/18  Yes Fayrene Helper, MD  spironolactone (ALDACTONE) 50 MG tablet TAKE 1 TABLET BY MOUTH EVERY DAY 08/17/18  Yes Fayrene Helper, MD  doxycycline (VIBRAMYCIN) 100 MG capsule Take 1 capsule (100 mg total) by mouth 2 (two) times daily. Patient not taking: Reported on 11/07/2018 10/14/18   Nat Christen, MD  predniSONE (DELTASONE) 10 MG tablet Take 2 tablets (20 mg total) by mouth daily. 11/07/18   Milton Ferguson, MD  UNABLE TO FIND Nebulizer facemask and tubing x 1  DX J44.9 10/28/18   Fayrene Helper, MD    Family History Family History  Problem Relation Age of Onset  . Heart attack Mother   . Diabetes Mother   . Hypertension Mother   . Stroke Brother   . Hypertension Sister   . Hypertension Sister   . Hypertension Sister   . Hypertension Brother     Social History Social History   Tobacco Use  . Smoking status: Current Some Day Smoker    Packs/day: 0.75    Years: 48.00    Pack years: 36.00    Types: Cigarettes    Start date: 10/16/1969  . Smokeless tobacco: Never Used  . Tobacco comment: hasn't smoked in 12 days  Substance Use Topics  . Alcohol use: Yes    Alcohol/week: 0.0 standard drinks    Comment: occassional , max of 72 oz pwr month  . Drug use: No     Allergies   Penicillins   Review of Systems Review of Systems  Constitutional: Negative for appetite change and fatigue.  HENT: Negative for congestion, ear discharge and sinus pressure.   Eyes: Negative for discharge.  Respiratory: Positive for shortness of  breath. Negative for cough.   Cardiovascular: Negative for chest pain.  Gastrointestinal: Negative for abdominal pain and diarrhea.  Genitourinary: Negative for frequency and hematuria.  Musculoskeletal: Negative for back pain.  Skin: Negative for rash.  Neurological: Negative for seizures and headaches.  Psychiatric/Behavioral: Negative for hallucinations.     Physical Exam Updated Vital Signs BP 133/84   Pulse 79   Temp 98.2 F (36.8 C) (Oral)   Resp 17   Ht 6\' 3"  (1.905 m)   Wt 73 kg   SpO2 92%   BMI 20.12 kg/m   Physical Exam Constitutional:      Appearance: He is well-developed.  HENT:     Head: Normocephalic.  Eyes:     General: No scleral icterus.    Conjunctiva/sclera: Conjunctivae normal.  Neck:     Musculoskeletal: Neck supple.     Thyroid: No thyromegaly.  Cardiovascular:  Rate and Rhythm: Normal rate and regular rhythm.     Heart sounds: No murmur. No friction rub. No gallop.   Pulmonary:     Breath sounds: No stridor. Wheezing present. No rales.  Chest:     Chest wall: No tenderness.  Abdominal:     General: There is no distension.     Tenderness: There is no abdominal tenderness. There is no rebound.  Musculoskeletal: Normal range of motion.  Lymphadenopathy:     Cervical: No cervical adenopathy.  Skin:    Findings: No erythema or rash.  Neurological:     Mental Status: He is oriented to person, place, and time.     Motor: No abnormal muscle tone.     Coordination: Coordination normal.  Psychiatric:        Behavior: Behavior normal.      ED Treatments / Results  Labs (all labs ordered are listed, but only abnormal results are displayed) Labs Reviewed  CBC WITH DIFFERENTIAL/PLATELET - Abnormal; Notable for the following components:      Result Value   Eosinophils Absolute 0.9 (*)    All other components within normal limits  COMPREHENSIVE METABOLIC PANEL  TROPONIN I  BRAIN NATRIURETIC PEPTIDE    EKG None  Radiology Dg Chest  2 View  Result Date: 11/07/2018 CLINICAL DATA:  Shortness of breath. EXAM: CHEST - 2 VIEW COMPARISON:  Radiographs of October 27, 2018. CT scan of May 31, 2018. FINDINGS: The heart size and mediastinal contours are within normal limits. Atherosclerosis of thoracic aorta is noted. No pneumothorax pleural effusion is noted. Stable calcified granuloma is noted in right upper lobe. Stable nodule is noted in right lower lobe as described on prior CT scan. No consolidative process is noted. The visualized skeletal structures are unremarkable. IMPRESSION: Stable calcified granuloma in right upper lobe. Stable noncalcified nodule seen in right lower lobe as described on prior CT scan. No other significant pulmonary abnormality is noted. Aortic Atherosclerosis (ICD10-I70.0). Electronically Signed   By: Marijo Conception, M.D.   On: 11/07/2018 10:46    Procedures Procedures (including critical care time)  Medications Ordered in ED Medications  magnesium sulfate IVPB 2 g 50 mL ( Intravenous Stopped 11/07/18 1054)  ipratropium-albuterol (DUONEB) 0.5-2.5 (3) MG/3ML nebulizer solution 3 mL (3 mLs Nebulization Given 11/07/18 0946)  albuterol (PROVENTIL) (2.5 MG/3ML) 0.083% nebulizer solution 2.5 mg (2.5 mg Nebulization Given 11/07/18 0946)  ipratropium-albuterol (DUONEB) 0.5-2.5 (3) MG/3ML nebulizer solution 3 mL (3 mLs Nebulization Given 11/07/18 1333)  albuterol (PROVENTIL) (2.5 MG/3ML) 0.083% nebulizer solution 2.5 mg (2.5 mg Nebulization Given 11/07/18 1333)     Initial Impression / Assessment and Plan / ED Course  I have reviewed the triage vital signs and the nursing notes.  Pertinent labs & imaging results that were available during my care of the patient were reviewed by me and considered in my medical decision making (see chart for details).    Patient improved with neb treatments.  Chest x-ray does not show any pneumonia.  Patient will be discharged home with prednisone and will follow-up with his PCP.   He will continue his neb treatments  Final Clinical Impressions(s) / ED Diagnoses   Final diagnoses:  COPD exacerbation [facility])    ED Discharge Orders         Ordered    predniSONE (DELTASONE) 10 MG tablet  Daily     11/07/18 1440           Milton Ferguson, MD 11/07/18 1443

## 2018-11-07 NOTE — Discharge Instructions (Addendum)
Continue using your nebulizers.  Follow-up with your doctor this week for recheck

## 2018-11-07 NOTE — ED Notes (Signed)
RT notified for neb tx. 

## 2018-11-07 NOTE — ED Triage Notes (Signed)
Pt reports waking up at 0730 this morning SOB and coughing. Reports chest feels tight. Denies pain

## 2018-11-22 ENCOUNTER — Other Ambulatory Visit: Payer: Self-pay | Admitting: Family Medicine

## 2018-11-23 ENCOUNTER — Other Ambulatory Visit: Payer: Self-pay

## 2018-11-23 NOTE — Telephone Encounter (Signed)
Do you agree to change the advair to the Debe Coder which is preferred?

## 2018-11-25 ENCOUNTER — Ambulatory Visit (INDEPENDENT_AMBULATORY_CARE_PROVIDER_SITE_OTHER): Payer: Medicaid Other | Admitting: Orthopaedic Surgery

## 2018-11-25 ENCOUNTER — Telehealth: Payer: Self-pay | Admitting: Family Medicine

## 2018-11-25 ENCOUNTER — Ambulatory Visit (INDEPENDENT_AMBULATORY_CARE_PROVIDER_SITE_OTHER): Payer: Medicaid Other

## 2018-11-25 ENCOUNTER — Other Ambulatory Visit: Payer: Self-pay | Admitting: Family Medicine

## 2018-11-25 ENCOUNTER — Encounter: Payer: Self-pay | Admitting: Orthopaedic Surgery

## 2018-11-25 VITALS — BP 144/86 | HR 73 | Ht 75.0 in | Wt 162.0 lb

## 2018-11-25 DIAGNOSIS — G8929 Other chronic pain: Secondary | ICD-10-CM

## 2018-11-25 DIAGNOSIS — M25512 Pain in left shoulder: Secondary | ICD-10-CM

## 2018-11-25 DIAGNOSIS — F17210 Nicotine dependence, cigarettes, uncomplicated: Secondary | ICD-10-CM | POA: Diagnosis not present

## 2018-11-25 MED ORDER — HYDROCODONE-ACETAMINOPHEN 5-325 MG PO TABS: ORAL_TABLET | ORAL | 0 refills | Status: DC

## 2018-11-25 MED ORDER — FLUTICASONE FUROATE-VILANTEROL 200-25 MCG/INH IN AEPB: 1.0000 | INHALATION_SPRAY | Freq: Every day | RESPIRATORY_TRACT | 5 refills | Status: DC

## 2018-11-25 NOTE — Telephone Encounter (Signed)
Pls call pt ands let him know changed to Breo from Advair due to formulary coverage

## 2018-11-25 NOTE — Telephone Encounter (Signed)
Patient aware.

## 2018-11-25 NOTE — Progress Notes (Signed)
Patient CH:Dillon [name], male DOB:07-Sep-1951, 68 y.o. POE:[id number]  Chief Complaint  Patient presents with  . Back Pain  . Shoulder Pain    HPI  Dillon [name] is a 68 y.o. male who has left shoulder pain that is not getting any better.  He has pain most of the time, more with overhead use and rolling over on it at night.  He has no trauma, no paresthesias.  He is taking pain medicine that helps.  Advil, tylenol and rubs have not helped.  I will get a MRI of the shoulder.   Body mass index is 20.25 kg/m.  ROS  Review of Systems  Respiratory: Positive for shortness of breath. Negative for cough.   Cardiovascular: Negative for chest pain and leg swelling.  Endocrine: Negative for cold intolerance.  Musculoskeletal: Positive for arthralgias and myalgias.  Allergic/Immunologic: Positive for environmental allergies.  All other systems reviewed and are negative.   All other systems reviewed and are negative.  The following is a summary of the past history medically, past history surgically, known current medicines, social history and family history.  This information is gathered electronically by the computer from prior information and documentation.  I review this each visit and have found including this information at this point in the chart is beneficial and informative.    Past Medical History:  Diagnosis Date  . Arthritis    2012  . Asthma    childhood  . COPD (chronic obstructive pulmonary disease) ([place])   . Hyperlipidemia    1990  . Hypertension    1990  . Nicotine addiction   . PAD (peripheral artery disease) ([place]) 2015    Past Surgical History:  Procedure Laterality Date  . KNEE ARTHROSCOPY     both knees   . LOWER EXTREMITY ANGIOGRAPHY N/A 02/04/2018   Procedure: LOWER EXTREMITY ANGIOGRAPHY;  Surgeon: Conrad Cascade, MD;  Location: [facility];  Service: Cardiovascular;  Laterality: N/A;    Family History  Problem Relation Age of Onset  . Heart  attack Mother   . Diabetes Mother   . Hypertension Mother   . Stroke Brother   . Hypertension Sister   . Hypertension Sister   . Hypertension Sister   . Hypertension Brother     Social History Social History   Tobacco Use  . Smoking status: Current Some Day Smoker    Packs/day: 0.75    Years: 48.00    Pack years: 36.00    Types: Cigarettes    Start date: 10/16/1969  . Smokeless tobacco: Never Used  . Tobacco comment: hasn't smoked in 12 days  Substance Use Topics  . Alcohol use: Yes    Alcohol/week: 0.0 standard drinks    Comment: occassional , max of 72 oz pwr month  . Drug use: No    Allergies  Allergen Reactions  . Penicillins Hives and Other (See Comments)    Has patient had a PCN reaction causing immediate rash, facial/tongue/throat swelling, SOB or lightheadedness with hypotension: Unknown Has patient had a PCN reaction causing severe rash involving mucus membranes or skin necrosis: No Has patient had a PCN reaction that required hospitalization: Yes Has patient had a PCN reaction occurring within the last 10 years: No If all of the above answers are "NO", then may proceed with Cephalosporin use.     Current Outpatient Medications  Medication Sig Dispense Refill  . albuterol (PROVENTIL HFA;VENTOLIN HFA) 108 (90 Base) MCG/ACT inhaler Inhale 2 puffs into  the lungs every 6 (six) hours as needed for wheezing. 1 Inhaler 3  . albuterol (PROVENTIL) (2.5 MG/3ML) 0.083% nebulizer solution Take 3 mLs (2.5 mg total) by nebulization every 4 (four) hours as needed for wheezing or shortness of breath. 75 mL 3  . amLODipine (NORVASC) 10 MG tablet TAKE 1 TABLET BY MOUTH EVERY DAY 90 tablet 1  . aspirin EC 81 MG tablet Take 81 mg by mouth daily.    . cyclobenzaprine (FLEXERIL) 10 MG tablet Take 1 tablet (10 mg total) by mouth at bedtime. One tablet every night at bedtime as needed for spasm. 30 tablet 2  . doxycycline (VIBRAMYCIN) 100 MG capsule Take 1 capsule (100 mg total) by  mouth 2 (two) times daily. (Patient not taking: Reported on 11/07/2018) 20 capsule 0  . ergocalciferol (VITAMIN D2) 50000 units capsule Take 1 capsule (50,000 Units total) by mouth once a week. One capsule once weekly (Patient taking differently: Take 50,000 Units by mouth every Tuesday. ) 12 capsule 3  . Fluticasone-Salmeterol (ADVAIR DISKUS) 100-50 MCG/DOSE AEPB Inhale 1 puff into the lungs 2 (two) times daily. 60 each 5  . gabapentin (NEURONTIN) 300 MG capsule TAKE 1 CAPSULE (300 MG TOTAL) BY MOUTH AT BEDTIME. 30 capsule 2  . guaiFENesin-dextromethorphan (ROBITUSSIN DM) 100-10 MG/5ML syrup Take 5 mLs by mouth every 4 (four) hours as needed for cough. 118 mL 0  . HYDROcodone-acetaminophen (NORCO/VICODIN) 5-325 MG tablet One tablet every six hours as needed for pain. 100 tablet 0  . ipratropium (ATROVENT) 0.02 % nebulizer solution Take 2.5 mLs (0.5 mg total) by nebulization every 6 (six) hours as needed for wheezing or shortness of breath. 75 mL 12  . levalbuterol (XOPENEX) 0.63 MG/3ML nebulizer solution Take 3 mLs (0.63 mg total) by nebulization every 8 (eight) hours as needed for wheezing or shortness of breath. 3 mL 12  . meloxicam (MOBIC) 15 MG tablet TAKE 1 TABLET BY MOUTH ONCE DAILY AS NEEDED FOR UNCONTROLLED ARTHRITIS PAIN 90 tablet 0  . pravastatin (PRAVACHOL) 80 MG tablet TAKE 1 TABLET BY MOUTH EVERY DAY 90 tablet 2  . predniSONE (DELTASONE) 10 MG tablet Take 2 tablets (20 mg total) by mouth daily. 14 tablet 0  . spironolactone (ALDACTONE) 50 MG tablet TAKE 1 TABLET BY MOUTH EVERY DAY 90 tablet 1  . UNABLE TO FIND Nebulizer facemask and tubing x 1  DX J44.9 1 each 0   No current facility-administered medications for this visit.      Physical Exam  Blood pressure (!) 144/86, pulse 73, height 6\' 3"  (1.905 m), weight 162 lb (73.5 kg).  Constitutional: overall normal hygiene, normal nutrition, well developed, normal grooming, normal body habitus. Assistive device:none  Musculoskeletal:  gait and station Limp none, muscle tone and strength are normal, no tremors or atrophy is present.  .  Neurological: coordination overall normal.  Deep tendon reflex/nerve stretch intact.  Sensation normal.  Cranial nerves II-XII intact.   Skin:   Normal overall no scars, lesions, ulcers or rashes. No psoriasis.  Psychiatric: Alert and oriented x 3.  Recent memory intact, remote memory unclear.  Normal mood and affect. Well groomed.  Good eye contact.  Cardiovascular: overall no swelling, no varicosities, no edema bilaterally, normal temperatures of the legs and arms, no clubbing, cyanosis and good capillary refill.  Lymphatic: palpation is normal.  Left shoulder has full motion but pain in the extremes.  NV intact, grips normal.  All other systems reviewed and are negative   The patient has  been educated about the nature of the problem(s) and counseled on treatment options.  The patient appeared to understand what I have discussed and is in agreement with it.  X-rays of the left shoulder were done, reported separately.  Encounter Diagnoses  Name Primary?  . Chronic left shoulder pain Yes  . Cigarette nicotine dependence without complication     PLAN Call if any problems.  Precautions discussed.  Continue current medications.   Return to clinic MRI left shoulder   Electronically Signed Sanjuana Kava, MD 1/30/20209:35 AM

## 2018-11-25 NOTE — Telephone Encounter (Signed)
Yes please change to the preferred drug

## 2018-12-03 ENCOUNTER — Ambulatory Visit (HOSPITAL_COMMUNITY)
Admission: RE | Admit: 2018-12-03 | Discharge: 2018-12-03 | Disposition: A | Discharge status: Home / Self Care | Payer: Medicaid Other | Source: Ambulatory Visit | Attending: Orthopaedic Surgery | Admitting: Orthopaedic Surgery

## 2018-12-03 ENCOUNTER — Other Ambulatory Visit: Payer: Self-pay | Admitting: Family Medicine

## 2018-12-03 DIAGNOSIS — G8929 Other chronic pain: Secondary | ICD-10-CM | POA: Diagnosis present

## 2018-12-03 DIAGNOSIS — M25512 Pain in left shoulder: Secondary | ICD-10-CM | POA: Insufficient documentation

## 2018-12-03 NOTE — Progress Notes (Signed)
No medication prescribed

## 2018-12-07 ENCOUNTER — Encounter: Payer: Self-pay | Admitting: Orthopaedic Surgery

## 2018-12-07 ENCOUNTER — Ambulatory Visit (INDEPENDENT_AMBULATORY_CARE_PROVIDER_SITE_OTHER): Payer: Medicaid Other | Admitting: Orthopaedic Surgery

## 2018-12-07 VITALS — BP 122/79 | HR 74 | Ht 75.0 in | Wt 164.0 lb

## 2018-12-07 DIAGNOSIS — F17210 Nicotine dependence, cigarettes, uncomplicated: Secondary | ICD-10-CM | POA: Diagnosis not present

## 2018-12-07 DIAGNOSIS — M25512 Pain in left shoulder: Secondary | ICD-10-CM | POA: Diagnosis not present

## 2018-12-07 DIAGNOSIS — G8929 Other chronic pain: Secondary | ICD-10-CM | POA: Diagnosis not present

## 2018-12-07 NOTE — Patient Instructions (Signed)
Steps to Quit Smoking    Smoking tobacco can be bad for your health. It can also affect almost every organ in your body. Smoking puts you and people around you at risk for many serious long-lasting (chronic) diseases. Quitting smoking is hard, but it is one of the best things that you can do for your health. It is never too late to quit.  What are the benefits of quitting smoking?  When you quit smoking, you lower your risk for getting serious diseases and conditions. They can include:  · Lung cancer or lung disease.  · Heart disease.  · Stroke.  · Heart attack.  · Not being able to have children (infertility).  · Weak bones (osteoporosis) and broken bones (fractures).  If you have coughing, wheezing, and shortness of breath, those symptoms may get better when you quit. You may also get sick less often. If you are pregnant, quitting smoking can help to lower your chances of having a baby of low birth weight.  What can I do to help me quit smoking?  Talk with your doctor about what can help you quit smoking. Some things you can do (strategies) include:  · Quitting smoking totally, instead of slowly cutting back how much you smoke over a period of time.  · Going to in-person counseling. You are more likely to quit if you go to many counseling sessions.  · Using resources and support systems, such as:  ? Online chats with a counselor.  ? Phone quitlines.  ? Printed self-help materials.  ? Support groups or group counseling.  ? Text messaging programs.  ? Mobile phone apps or applications.  · Taking medicines. Some of these medicines may have nicotine in them. If you are pregnant or breastfeeding, do not take any medicines to quit smoking unless your doctor says it is okay. Talk with your doctor about counseling or other things that can help you.  Talk with your doctor about using more than one strategy at the same time, such as taking medicines while you are also going to in-person counseling. This can help make  quitting easier.  What things can I do to make it easier to quit?  Quitting smoking might feel very hard at first, but there is a lot that you can do to make it easier. Take these steps:  · Talk to your family and friends. Ask them to support and encourage you.  · Call phone quitlines, reach out to support groups, or work with a counselor.  · Ask people who smoke to not smoke around you.  · Avoid places that make you want (trigger) to smoke, such as:  ? Bars.  ? Parties.  ? Smoke-break areas at work.  · Spend time with people who do not smoke.  · Lower the stress in your life. Stress can make you want to smoke. Try these things to help your stress:  ? Getting regular exercise.  ? Deep-breathing exercises.  ? Yoga.  ? Meditating.  ? Doing a body scan. To do this, close your eyes, focus on one area of your body at a time from head to toe, and notice which parts of your body are tense. Try to relax the muscles in those areas.  · Download or buy apps on your mobile phone or tablet that can help you stick to your quit plan. There are many free apps, such as QuitGuide from the CDC (Centers for Disease Control and Prevention). You can find more   support from smokefree.gov and other websites.  This information is not intended to replace advice given to you by your health care provider. Make sure you discuss any questions you have with your health care provider.  Document Released: 08/09/2009 Document Revised: 06/10/2016 Document Reviewed: 02/27/2015  Elsevier Interactive Patient Education © 2019 [employer]vier Inc[employer]

## 2018-12-07 NOTE — Progress Notes (Signed)
[name], male DOB:10-Jan-1951, 68 y.o. PXT:[id number]  Chief Complaint  Patient presents with  . Shoulder Pain    left  . Results    review MRI     HPI  Dillon [name] is a 68 y.o. male who has worsening of his left shoulder pain.  He had a MRI of the shoulder which showed: IMPRESSION: 1. Moderate to significant supraspinatus tendinopathy/tendinosis with interstitial tears and probable shallow articular surface tears distally. No full-thickness retracted tear. 2. Mild to moderate infraspinatus and subscapularis tendinopathy. 3. There is thickening of the capsular structures in the axillary recess which can be seen with adhesive capsulitis or synovitis. 4. Intact long head biceps tendon and glenoid labrum. 5. Moderate to advanced AC joint degenerative changes. 6. Mild subacromial/subdeltoid bursitis.  I have explained the findings to him and used a model of the shoulder.  He plays down his pain but he hurts most of the time, is not sleeping well because rolling over on the shoulder hurts and wakes him up.  He has pain with overhead use and now with some regular use.  Injections and pain medicine have not helped that much.  I will have him see Dr. Aline Brochure and see if debriedment would be advisable.  He agrees to do this.    Body mass index is 20.5 kg/m.  ROS  Review of Systems  Respiratory: Positive for shortness of breath. Negative for cough.   Cardiovascular: Negative for chest pain and leg swelling.  Endocrine: Negative for cold intolerance.  Musculoskeletal: Positive for arthralgias and myalgias.  Allergic/Immunologic: Positive for environmental allergies.  All other systems reviewed and are negative.   All other systems reviewed and are negative.  The following is a summary of the past history medically, past history surgically, known current medicines, social history and family history.  This information is gathered electronically by the computer from  prior information and documentation.  I review this each visit and have found including this information at this point in the chart is beneficial and informative.    Past Medical History:  Diagnosis Date  . Arthritis    2012  . Asthma    childhood  . COPD (chronic obstructive pulmonary disease) ([place])   . Hyperlipidemia    1990  . Hypertension    1990  . Nicotine addiction   . PAD (peripheral artery disease) ([place]) 2015    Past Surgical History:  Procedure Laterality Date  . KNEE ARTHROSCOPY     both knees   . LOWER EXTREMITY ANGIOGRAPHY N/A 02/04/2018   Procedure: LOWER EXTREMITY ANGIOGRAPHY;  Surgeon: Conrad Mildred, MD;  Location: [facility];  Service: Cardiovascular;  Laterality: N/A;    Family History  Problem Relation Age of Onset  . Heart attack Mother   . Diabetes Mother   . Hypertension Mother   . Stroke Brother   . Hypertension Sister   . Hypertension Sister   . Hypertension Sister   . Hypertension Brother     Social History Social History   Tobacco Use  . Smoking status: Current Some Day Smoker    Packs/day: 0.75    Years: 48.00    Pack years: 36.00    Types: Cigarettes    Start date: 10/16/1969  . Smokeless tobacco: Never Used  Substance Use Topics  . Alcohol use: Yes    Alcohol/week: 0.0 standard drinks    Comment: occassional , max of 72 oz pwr month  . Drug use: No  Allergies  Allergen Reactions  . Penicillins Hives and Other (See Comments)    Has patient had a PCN reaction causing immediate rash, facial/tongue/throat swelling, SOB or lightheadedness with hypotension: Unknown Has patient had a PCN reaction causing severe rash involving mucus membranes or skin necrosis: No Has patient had a PCN reaction that required hospitalization: Yes Has patient had a PCN reaction occurring within the last 10 years: No If all of the above answers are "NO", then may proceed with Cephalosporin use.     Current Outpatient Medications  Medication  Sig Dispense Refill  . albuterol (PROVENTIL HFA;VENTOLIN HFA) 108 (90 Base) MCG/ACT inhaler Inhale 2 puffs into the lungs every 6 (six) hours as needed for wheezing. 1 Inhaler 3  . albuterol (PROVENTIL) (2.5 MG/3ML) 0.083% nebulizer solution Take 3 mLs (2.5 mg total) by nebulization every 4 (four) hours as needed for wheezing or shortness of breath. 75 mL 3  . amLODipine (NORVASC) 10 MG tablet TAKE 1 TABLET BY MOUTH EVERY DAY 90 tablet 1  . aspirin EC 81 MG tablet Take 81 mg by mouth daily.    . cyclobenzaprine (FLEXERIL) 10 MG tablet Take 1 tablet (10 mg total) by mouth at bedtime. One tablet every night at bedtime as needed for spasm. 30 tablet 2  . doxycycline (VIBRAMYCIN) 100 MG capsule Take 1 capsule (100 mg total) by mouth 2 (two) times daily. 20 capsule 0  . ergocalciferol (VITAMIN D2) 50000 units capsule Take 1 capsule (50,000 Units total) by mouth once a week. One capsule once weekly (Patient taking differently: Take 50,000 Units by mouth every Tuesday. ) 12 capsule 3  . fluticasone furoate-vilanterol (BREO ELLIPTA) 200-25 MCG/INH AEPB Inhale 1 puff into the lungs daily. 28 each 5  . gabapentin (NEURONTIN) 300 MG capsule TAKE 1 CAPSULE (300 MG TOTAL) BY MOUTH AT BEDTIME. 30 capsule 2  . guaiFENesin-dextromethorphan (ROBITUSSIN DM) 100-10 MG/5ML syrup Take 5 mLs by mouth every 4 (four) hours as needed for cough. 118 mL 0  . HYDROcodone-acetaminophen (NORCO/VICODIN) 5-325 MG tablet One tablet every six hours as needed for pain. 100 tablet 0  . ipratropium (ATROVENT) 0.02 % nebulizer solution Take 2.5 mLs (0.5 mg total) by nebulization every 6 (six) hours as needed for wheezing or shortness of breath. 75 mL 12  . levalbuterol (XOPENEX) 0.63 MG/3ML nebulizer solution Take 3 mLs (0.63 mg total) by nebulization every 8 (eight) hours as needed for wheezing or shortness of breath. 3 mL 12  . meloxicam (MOBIC) 15 MG tablet TAKE 1 TABLET BY MOUTH ONCE DAILY AS NEEDED FOR UNCONTROLLED ARTHRITIS PAIN 90  tablet 0  . pravastatin (PRAVACHOL) 80 MG tablet TAKE 1 TABLET BY MOUTH EVERY DAY 90 tablet 2  . predniSONE (DELTASONE) 10 MG tablet Take 2 tablets (20 mg total) by mouth daily. 14 tablet 0  . spironolactone (ALDACTONE) 50 MG tablet TAKE 1 TABLET BY MOUTH EVERY DAY 90 tablet 1  . UNABLE TO FIND Nebulizer facemask and tubing x 1  DX J44.9 1 each 0   No current facility-administered medications for this visit.      Physical Exam  Blood pressure 122/79, pulse 74, height 6\' 3"  (1.905 m), weight 164 lb (74.4 kg).  Constitutional: overall normal hygiene, normal nutrition, well developed, normal grooming, normal body habitus. Assistive device:none  Musculoskeletal: gait and station Limp none, muscle tone and strength are normal, no tremors or atrophy is present.  .  Neurological: coordination overall normal.  Deep tendon reflex/nerve stretch intact.  Sensation normal.  Cranial nerves II-XII intact.   Skin:   Normal overall no scars, lesions, ulcers or rashes. No psoriasis.  Psychiatric: Alert and oriented x 3.  Recent memory intact, remote memory unclear.  Normal mood and affect. Well groomed.  Good eye contact.  Cardiovascular: overall no swelling, no varicosities, no edema bilaterally, normal temperatures of the legs and arms, no clubbing, cyanosis and good capillary refill.  Lymphatic: palpation is normal.  Left shoulder is painful.  He has full motion but it hurts after 90 degrees forward and abduction.  NV intact.  There is no effusion.  All other systems reviewed and are negative   The patient has been educated about the nature of the problem(s) and counseled on treatment options.  The patient appeared to understand what I have discussed and is in agreement with it.  Encounter Diagnoses  Name Primary?  . Chronic left shoulder pain Yes  . Cigarette nicotine dependence without complication     PLAN Call if any problems.  Precautions discussed.  Continue current medications.    Return to clinic to see Dr. Aline Brochure   Electronically Signed Sanjuana Kava, MD 2/11/20209:06 AM

## 2018-12-08 ENCOUNTER — Encounter: Payer: Self-pay | Admitting: Nurse Practitioner

## 2018-12-08 ENCOUNTER — Ambulatory Visit (INDEPENDENT_AMBULATORY_CARE_PROVIDER_SITE_OTHER): Payer: Medicaid Other | Admitting: Nurse Practitioner

## 2018-12-08 DIAGNOSIS — Z0000 Encounter for general adult medical examination without abnormal findings: Secondary | ICD-10-CM

## 2018-12-08 DIAGNOSIS — R69 Illness, unspecified: Secondary | ICD-10-CM | POA: Diagnosis not present

## 2018-12-08 NOTE — Patient Instructions (Signed)
Your health issues we discussed today were:   Need for colonoscopy: 1. We will schedule your colonoscopy for you. 2. Further recommendations will be made after your colonoscopy  Overall I recommend:  1. Return for follow-up based on recommendations made after your colonoscopy, or as needed for any GI problems 2. Call us if you have any questions or concerns.  At [facility] we value your feedback. You may receive a survey about your visit today. Please share your experience as we strive to create trusting relationships with our patients to provide genuine, compassionate, quality care.  We appreciate your understanding and patience as we review any laboratory studies, imaging, and other diagnostic tests that are ordered as we care for you. Our office policy is 5 business days for review of these results, and any emergent or urgent results are addressed in a timely manner for your best interest. If you do not hear from our office in 1 week, please contact us.   We also encourage the use of MyChart, which contains your medical information for your review as well. If you are not enrolled in this feature, an access code is on this after visit summary for your convenience. Thank you for allowing [place] to be involved in your care.  It was great to see you today!  I hope you have a great day!!

## 2018-12-08 NOTE — Progress Notes (Signed)
CC'ED TO PCP 

## 2018-12-08 NOTE — Assessment & Plan Note (Addendum)
The patient is never had a colonoscopy before.  He is currently quite overdue for his first ever screening exam.  Generally asymptomatic from a GI standpoint.  We will proceed with colonoscopy at this time.  Follow-up based on post procedure recommendations or as needed for GI problems.  Proceed with colonoscopy on propofol/MAC with Dr. Oneida Alar in the near future. The risks, benefits, and alternatives have been discussed in detail with the patient. They state understanding and desire to proceed.   The patient is currently on hydrocodone, Neurontin.  No other anticoagulants, anxiolytics, chronic pain medications, or antidepressants.  Occasional/social alcohol use, no drug use.  Given medications and social alcohol use we will plan for the procedure on propofol/MAC to promote adequate sedation.

## 2018-12-08 NOTE — Progress Notes (Signed)
Primary Care Physician:  Fayrene Helper, MD Primary Gastroenterologist:  Dr. Oneida Alar  Chief Complaint  Patient presents with  . Colonoscopy    never had tcs    HPI:   Dillon [name] is a 68 y.o. male who presents on referral from primary care to schedule colonoscopy.  Nurse/phone triage was deferred to office visit due to medications likely necessitating augmented sedation.  Reviewed information provided with the referral including office visit associated with the referral dated 09/09/2018.  At that time the patient was not having any GI symptoms.  He was referred to GI for colonoscopy which is past due.  No history of colonoscopy in our system.  Today he states he is doing well overall.  Has never had a colonoscopy before. Has intermittent RLQ discomfort, worse with coughing and feels like a cramp; massaging it makes it go away. Denies constipation. Denies N/V, hematochezia, melena, fever, chills, unintentional weight loss. Denies chest pain, dyspnea, dizziness, lightheadedness, syncope, near syncope. Denies any other upper or lower GI symptoms. Was recently referred to Dr. Jarvis Morgan for shoulder surgery; has an appointment to discuss last this month.  Past Medical History:  Diagnosis Date  . Arthritis    2012  . Asthma    childhood  . COPD (chronic obstructive pulmonary disease) ([place])   . Hyperlipidemia    1990  . Hypertension    1990  . Nicotine addiction   . PAD (peripheral artery disease) ([place]) 2015    Past Surgical History:  Procedure Laterality Date  . KNEE ARTHROSCOPY     both knees   . LOWER EXTREMITY ANGIOGRAPHY N/A 02/04/2018   Procedure: LOWER EXTREMITY ANGIOGRAPHY;  Surgeon: Conrad Hackensack, MD;  Location: [facility];  Service: Cardiovascular;  Laterality: N/A;    Current Outpatient Medications  Medication Sig Dispense Refill  . albuterol (PROVENTIL HFA;VENTOLIN HFA) 108 (90 Base) MCG/ACT inhaler Inhale 2 puffs into the lungs every 6 (six) hours as  needed for wheezing. 1 Inhaler 3  . albuterol (PROVENTIL) (2.5 MG/3ML) 0.083% nebulizer solution Take 3 mLs (2.5 mg total) by nebulization every 4 (four) hours as needed for wheezing or shortness of breath. 75 mL 3  . amLODipine (NORVASC) 10 MG tablet TAKE 1 TABLET BY MOUTH EVERY DAY 90 tablet 1  . aspirin EC 81 MG tablet Take 81 mg by mouth daily.    . cyclobenzaprine (FLEXERIL) 10 MG tablet Take 1 tablet (10 mg total) by mouth at bedtime. One tablet every night at bedtime as needed for spasm. 30 tablet 2  . doxycycline (VIBRAMYCIN) 100 MG capsule Take 1 capsule (100 mg total) by mouth 2 (two) times daily. 20 capsule 0  . ergocalciferol (VITAMIN D2) 50000 units capsule Take 1 capsule (50,000 Units total) by mouth once a week. One capsule once weekly (Patient taking differently: Take 50,000 Units by mouth every Tuesday. ) 12 capsule 3  . fluticasone furoate-vilanterol (BREO ELLIPTA) 200-25 MCG/INH AEPB Inhale 1 puff into the lungs daily. 28 each 5  . gabapentin (NEURONTIN) 300 MG capsule TAKE 1 CAPSULE (300 MG TOTAL) BY MOUTH AT BEDTIME. 30 capsule 2  . guaiFENesin-dextromethorphan (ROBITUSSIN DM) 100-10 MG/5ML syrup Take 5 mLs by mouth every 4 (four) hours as needed for cough. 118 mL 0  . HYDROcodone-acetaminophen (NORCO/VICODIN) 5-325 MG tablet One tablet every six hours as needed for pain. 100 tablet 0  . ipratropium (ATROVENT) 0.02 % nebulizer solution Take 2.5 mLs (0.5 mg total) by nebulization every 6 (six)  hours as needed for wheezing or shortness of breath. 75 mL 12  . levalbuterol (XOPENEX) 0.63 MG/3ML nebulizer solution Take 3 mLs (0.63 mg total) by nebulization every 8 (eight) hours as needed for wheezing or shortness of breath. 3 mL 12  . meloxicam (MOBIC) 15 MG tablet TAKE 1 TABLET BY MOUTH ONCE DAILY AS NEEDED FOR UNCONTROLLED ARTHRITIS PAIN 90 tablet 0  . pravastatin (PRAVACHOL) 80 MG tablet TAKE 1 TABLET BY MOUTH EVERY DAY 90 tablet 2  . predniSONE (DELTASONE) 10 MG tablet Take 2  tablets (20 mg total) by mouth daily. 14 tablet 0  . spironolactone (ALDACTONE) 50 MG tablet TAKE 1 TABLET BY MOUTH EVERY DAY 90 tablet 1  . UNABLE TO FIND Nebulizer facemask and tubing x 1  DX J44.9 1 each 0   No current facility-administered medications for this visit.     Allergies as of 12/08/2018 - Review Complete 12/08/2018  Allergen Reaction Noted  . Penicillins Hives and Other (See Comments) 02/24/2008    Family History  Problem Relation Age of Onset  . Heart attack Mother   . Diabetes Mother   . Hypertension Mother   . Stroke Brother   . Hypertension Sister   . Hypertension Sister   . Hypertension Sister   . Hypertension Brother   . Colon cancer Neg Hx     Social History   Socioeconomic History  . Marital status: Single    Spouse name: Not on file  . Number of children: Not on file  . Years of education: Not on file  . Highest education level: Not on file  Occupational History  . Not on file  Social Needs  . Financial resource strain: Not on file  . Food insecurity:    Worry: Not on file    Inability: Not on file  . Transportation needs:    Medical: Not on file    Non-medical: Not on file  Tobacco Use  . Smoking status: Current Some Day Smoker    Packs/day: 0.25    Years: 48.00    Pack years: 12.00    Types: Cigarettes    Start date: 10/16/1969  . Smokeless tobacco: Never Used  Substance and Sexual Activity  . Alcohol use: Yes    Alcohol/week: 0.0 standard drinks    Comment: occassional , max of 72 oz pwr month  . Drug use: No  . Sexual activity: Yes    Partners: Female  Lifestyle  . Physical activity:    Days per week: Not on file    Minutes per session: Not on file  . Stress: Not on file  Relationships  . Social connections:    Talks on phone: Not on file    Gets together: Not on file    Attends religious service: Not on file    Active member of club or organization: Not on file    Attends meetings of clubs or organizations: Not on file     Relationship status: Not on file  . Intimate partner violence:    Fear of current or ex partner: Not on file    Emotionally abused: Not on file    Physically abused: Not on file    Forced sexual activity: Not on file  Other Topics Concern  . Not on file  Social History Narrative  . Not on file    Review of Systems: Complete ROS negative except as per HPI.    Physical Exam: BP 129/82   Pulse 82  Temp (!) 97.3 F (36.3 C) (Oral)   Ht 6' 2.5" (1.892 m)   Wt 164 lb (74.4 kg)   BMI 20.77 kg/m  General:   Alert and oriented. Pleasant and cooperative. Well-nourished and well-developed.  Head:  Normocephalic and atraumatic. Eyes:  Without icterus, sclera clear and conjunctiva pink.  Ears:  Normal auditory acuity. Cardiovascular:  S1, S2 present without murmurs appreciated. Extremities without clubbing or edema. Respiratory:  Clear to auscultation bilaterally. No wheezes, rales, or rhonchi. No distress.  Gastrointestinal:  +BS, soft, non-tender and non-distended. No HSM noted. No guarding or rebound. No masses appreciated.  Rectal:  Deferred  Musculoskalatal:  Symmetrical without gross deformities. Neurologic:  Alert and oriented x4;  grossly normal neurologically. Psych:  Alert and cooperative. Normal mood and affect. Heme/Lymph/Immune: No excessive bruising noted.    12/08/2018 10:02 AM   Disclaimer: This note was dictated with voice recognition software. Similar sounding words can inadvertently be transcribed and may not be corrected upon review.

## 2018-12-08 NOTE — Progress Notes (Signed)
CC'D TO PCP °

## 2018-12-08 NOTE — Assessment & Plan Note (Signed)
The patient is currently on multiple medications for chronic disease that necessitate augmented sedation at this time including hydrocodone and Neurontin.  We can proceed with colonoscopy but will need propofol/MAC as per below.  Follow-up as needed or based on post procedure recommendations.

## 2018-12-20 ENCOUNTER — Encounter: Payer: Self-pay | Admitting: Orthopedic Surgery

## 2018-12-20 ENCOUNTER — Ambulatory Visit (INDEPENDENT_AMBULATORY_CARE_PROVIDER_SITE_OTHER): Payer: Medicaid Other | Admitting: Orthopedic Surgery

## 2018-12-20 ENCOUNTER — Telehealth: Payer: Self-pay | Admitting: Radiology

## 2018-12-20 VITALS — BP 145/89 | HR 78 | Ht 74.5 in | Wt 164.0 lb

## 2018-12-20 DIAGNOSIS — M7502 Adhesive capsulitis of left shoulder: Secondary | ICD-10-CM

## 2018-12-20 DIAGNOSIS — M75112 Incomplete rotator cuff tear or rupture of left shoulder, not specified as traumatic: Secondary | ICD-10-CM | POA: Diagnosis not present

## 2018-12-20 NOTE — Telephone Encounter (Signed)
-----   Message from Dillon [name] sent at 12/20/2018  1:27 PM EST ----- Is it possible to change his order to say OT?  please

## 2018-12-20 NOTE — Progress Notes (Signed)
PREOP CONSULT/REFERRAL INTRA-OFFICE FROM DR Tyrone Apple   Chief Complaint  Patient presents with  . Shoulder Pain    Left shoulder pain, referred by Dr Luna Glasgow.    68 year old male referred for possible shoulder surgery on his left shoulder.  He said he worked at [employer] at some older and many of the employees there had difficulties with her shoulder.  Back in 2003 started having discomfort he went to his employee provided nurse never had any major treatment but saw Dr. Luna Glasgow after that and has been following him for years.  He had a recent MRI which showed he had moderate to significant supraspinatus tendinopathy and tendinosis with interstitial tearing and partial shallow articular surface tear distally without full-thickness or retracted tear there was moderate tendinopathy of the subscapularis and infraspinatus thickening of the capsular structures in the axillary resource consistent with adhesive capsulitis and synovitis biceps tendon was intact glenoid labrum intact he had AC joint arthritis and some bursitis.  He remain only complains of pain at night which wakes him up when he rolls on his side he has difficulty when working in the yard he says other than that he does pretty well.  He takes a hot shower uses some icy hot he gets along fairly well  Previous treatment includes injection and oral medication including opioids but he has not had physical therapy  His pain is rated as moderate and a dull ache   Review of Systems  Musculoskeletal: Negative for back pain and neck pain.  Neurological: Negative for tingling and sensory change.     Past Medical History:  Diagnosis Date  . Arthritis    2012  . Asthma    childhood  . COPD (chronic obstructive pulmonary disease) ([place])   . Hyperlipidemia    1990  . Hypertension    1990  . Nicotine addiction   . PAD (peripheral artery disease) ([place]) 2015    Past Surgical History:  Procedure Laterality Date  . KNEE  ARTHROSCOPY     both knees   . LOWER EXTREMITY ANGIOGRAPHY N/A 02/04/2018   Procedure: LOWER EXTREMITY ANGIOGRAPHY;  Surgeon: Conrad , MD;  Location: [facility];  Service: Cardiovascular;  Laterality: N/A;    Family History  Problem Relation Age of Onset  . Heart attack Mother   . Diabetes Mother   . Hypertension Mother   . Stroke Brother   . Hypertension Sister   . Hypertension Sister   . Hypertension Sister   . Hypertension Brother   . Colon cancer Neg Hx    Social History   Tobacco Use  . Smoking status: Current Some Day Smoker    Packs/day: 0.25    Years: 48.00    Pack years: 12.00    Types: Cigarettes    Start date: 10/16/1969  . Smokeless tobacco: Never Used  Substance Use Topics  . Alcohol use: Yes    Alcohol/week: 0.0 standard drinks    Comment: occassional , max of 72 oz pwr month  . Drug use: No    Allergies  Allergen Reactions  . Penicillins Hives and Other (See Comments)    Has patient had a PCN reaction causing immediate rash, facial/tongue/throat swelling, SOB or lightheadedness with hypotension: Unknown Has patient had a PCN reaction causing severe rash involving mucus membranes or skin necrosis: No Has patient had a PCN reaction that required hospitalization: Yes Has patient had a PCN reaction occurring within the last 10 years: No If all of  the above answers are "NO", then may proceed with Cephalosporin use.      No outpatient medications have been marked as taking for the 12/20/18 encounter (Office Visit) with Carole Civil, MD.    BP (!) 145/89   Pulse 78   Ht 6' 2.5" (1.892 m)   Wt 164 lb (74.4 kg)   BMI 20.77 kg/m   Physical Exam Vitals signs reviewed.  Constitutional:      Appearance: Normal appearance. He is well-developed.  Skin:    General: Skin is warm and dry.     Findings: No erythema.  Neurological:     Mental Status: He is alert and oriented to person, place, and time.     Sensory: No sensory deficit.      Coordination: Coordination normal.     Gait: Gait normal.     Deep Tendon Reflexes: Reflexes normal.  Psychiatric:        Mood and Affect: Mood and affect normal.        Behavior: Behavior normal.        Thought Content: Thought content normal.        Judgment: Judgment normal.     Right Shoulder Exam  Right shoulder exam is normal.  Tenderness  The patient is experiencing no tenderness.  Range of Motion  The patient has normal right shoulder ROM.  Muscle Strength  The patient has normal right shoulder strength.  Tests  Apprehension: negative  Other  Erythema: absent Sensation: normal Pulse: present  Comments:  Thoracic internal rotation level to 7   Left Shoulder Exam   Tenderness  Left shoulder tenderness location: AC joint nontender subacromial space nontender anterior deltoid tender.  Muscle Strength  The patient has normal left shoulder strength.  Tests  Apprehension: negative Cross arm: negative Impingement: positive Drop arm: negative  Other  Erythema: absent Sensation: normal Pulse: present   Comments:  Active abduction 90 degrees active flexion 110 degrees active internal rotation thoracic level 12      MEDICAL DECISION SECTION  X-ray left shoulder done on November 25, 2018  AP lateral left shoulder narrowing of the [facility] [place] joint with mild inferior spurring on both the acromion and clavicle  There is irregularity of the greater tuberosity with increased sclerosis and cyst formation the glenohumeral joint otherwise looks normal the acromion is a type ii  MRI of the left shoulder does indeed show significant amount of tendinopathy and tendinosis with a partial articular surface tear which appears to be right at 50%  CLINICAL DATA:  Chronic left arm pain.   EXAM: MRI OF THE LEFT SHOULDER WITHOUT CONTRAST   TECHNIQUE: Multiplanar, multisequence MR imaging of the shoulder was performed. No intravenous contrast was administered.    COMPARISON:  Radiographs 11/25/2018   FINDINGS: Rotator cuff: Moderate to significant supraspinatus tendinopathy/tendinosis with fairly extensive interstitial tears and probable shallow articular surface tears distally. No full-thickness retracted tear.   The infraspinatus and subscapularis tendons demonstrate moderate tendinopathy but no partial or full-thickness tear.   Muscles: Some edema like signal abnormality tracking back along the musculotendinous junction region of the infraspinatus muscle likely due to a partial tear.   Biceps long head:  Intact   Acromioclavicular Joint: Moderate to advanced degenerative changes with superior and inferior spurring. Mild mass effect on the musculotendinous junction region of the supraspinatus. type 2 acromion. No lateral downsloping or undersurface spurring.   Glenohumeral Joint: Mild degenerative changes. No joint effusion. There is thickening of the capsular structures in  the axillary recess which can be seen with adhesive capsulitis or synovitis.   Labrum:  No definite labral tears.   Bones:  No acute bony findings.   Other: Mild subacromial/subdeltoid bursitis.   IMPRESSION: 1. Moderate to significant supraspinatus tendinopathy/tendinosis with interstitial tears and probable shallow articular surface tears distally. No full-thickness retracted tear. 2. Mild to moderate infraspinatus and subscapularis tendinopathy. 3. There is thickening of the capsular structures in the axillary recess which can be seen with adhesive capsulitis or synovitis. 4. Intact long head biceps tendon and glenoid labrum. 5. Moderate to advanced AC joint degenerative changes. 6. Mild subacromial/subdeltoid bursitis.     Electronically Signed   By: Marijo Sanes M.D.   On: 12/03/2018 11:22   Encounter Diagnoses  Name Primary?  . Adhesive capsulitis of left shoulder Yes  . Nontraumatic incomplete tear of left rotator cuff      PLAN:   Mr.  Loria and I talked about his condition.  He has a partial articular surface tear of his rotator cuff with tendinosis and tendinitis he also has adhesive capsulitis  We opted at this time for him to undergo a course of physical therapy and see if he improves if he does not we can proceed with rotator cuff repair  He probably needs a medical consult prior to surgery based on his medical history.  I would do an arthroscopy of the left shoulder and then takedown his partial tear and complete the tear with complete rotator cuff repair with suture anchors No orders of the defined types were placed in this encounter.   Arther Abbott, MD 12/20/2018 10:22 AM

## 2018-12-21 ENCOUNTER — Other Ambulatory Visit: Payer: Self-pay

## 2018-12-22 ENCOUNTER — Telehealth: Payer: Self-pay

## 2018-12-22 ENCOUNTER — Other Ambulatory Visit: Payer: Self-pay

## 2018-12-22 DIAGNOSIS — Z1211 Encounter for screening for malignant neoplasm of colon: Secondary | ICD-10-CM

## 2018-12-22 MED ORDER — CLENPIQ 10-3.5-12 MG-GM -GM/160ML PO SOLN: 1.0000 | Freq: Once | ORAL | 0 refills | Status: AC

## 2018-12-22 MED ORDER — HYDROCODONE-ACETAMINOPHEN 5-325 MG PO TABS: ORAL_TABLET | ORAL | 0 refills | Status: DC

## 2018-12-22 NOTE — Telephone Encounter (Signed)
Pt called office, TCS scheduled for 03/15/19 at 9:30am. Rx for prep sent to pharmacy. Orders entered. Will mail instructions after pre-op appt is scheduled.

## 2018-12-22 NOTE — Telephone Encounter (Signed)
Tried to call pt to schedule TCS w/Propofol w/SLF, no answer, LMOAM for return call.

## 2018-12-23 NOTE — Telephone Encounter (Signed)
Tried to call pt to inform of pre-op appt 03/09/19 at 10:00am, no answer, LMOAM. Letter mailed with procedure instructions.

## 2018-12-27 ENCOUNTER — Ambulatory Visit (HOSPITAL_COMMUNITY): Payer: Medicare Other | Attending: Orthopedic Surgery

## 2018-12-27 ENCOUNTER — Other Ambulatory Visit: Payer: Self-pay

## 2018-12-27 DIAGNOSIS — G8929 Other chronic pain: Secondary | ICD-10-CM

## 2018-12-27 DIAGNOSIS — M25512 Pain in left shoulder: Secondary | ICD-10-CM | POA: Diagnosis present

## 2018-12-27 DIAGNOSIS — M25612 Stiffness of left shoulder, not elsewhere classified: Secondary | ICD-10-CM | POA: Diagnosis present

## 2018-12-27 DIAGNOSIS — R29898 Other symptoms and signs involving the musculoskeletal system: Secondary | ICD-10-CM

## 2018-12-27 NOTE — Patient Instructions (Signed)
Complete the following exercises 2-3 times a day.  Doorway Stretch  Place each hand opposite each other on the doorway. (You can change where you feel the stretch by moving arms higher or lower.) Step through with one foot and bend front knee until a stretch is felt and hold. Step through with the opposite foot on the next rep. Hold for __15-30___ seconds. Repeat __2__times.       Wall Flexion  Slide your arm up the wall or door frame until a stretch is felt in your shoulder . Hold for 15-30 seconds. Complete 2 times     Shoulder Abduction Stretch  Stand side ways by a wall with affected up on wall. Gently step in toward wall to feel stretch. Hold for 10-15 seconds. Complete 2 times.  \    (Clinic) Extension / Flexion (Assist)   Face anchor, pull arms back, keeping elbow straight, and squeze shoulder blades together. Repeat 10-15 times. 1-3 times/day.   Copyright  V[employer]. All rights reserved.   (Home) Retraction: Row - Bilateral (Anchor)   Facing anchor, arms reaching forward, pull hands toward stomach, keeping elbows bent and at your sides and pinching shoulder blades together. Repeat 10-15 times. 1-3 times/day.   Copyright  VHI. All rights reserved.

## 2018-12-27 NOTE — Therapy (Signed)
[place], [place], [address] Phone: [phone]   Fax:  [phone]  Occupational Therapy Evaluation  Patient Details  Name: Dillon [name] MRN: [id number] Date of Birth: [birth year] Referring Provider (OT): Arther Abbott, MD   Encounter Date: 12/27/2018  OT End of Session - 12/27/18 1044    Visit Number  1    Number of Visits  8    Date for OT Re-Evaluation  01/24/19    Authorization Type  1) Medicaid is primary 2) medicare is secondary (only covers hospital services)    Authorization Time Period  requesting 3 visits from medicaid  on 12/27/18    OT Start Time  0950    OT Stop Time  1033    OT Time Calculation (min)  43 min    Activity Tolerance  Patient tolerated treatment well    Behavior During Therapy  [facility] for tasks assessed/performed       Past Medical History:  Diagnosis Date  . Arthritis    2012  . Asthma    childhood  . COPD (chronic obstructive pulmonary disease) ([place])   . Hyperlipidemia    1990  . Hypertension    1990  . Nicotine addiction   . PAD (peripheral artery disease) ([place]) 2015    Past Surgical History:  Procedure Laterality Date  . KNEE ARTHROSCOPY     both knees   . LOWER EXTREMITY ANGIOGRAPHY N/A 02/04/2018   Procedure: LOWER EXTREMITY ANGIOGRAPHY;  Surgeon: Conrad Naranja, MD;  Location: [facility];  Service: Cardiovascular;  Laterality: N/A;    There were no vitals filed for this visit.  Subjective Assessment - 12/27/18 0954    Subjective   S: Once I get above shoulder level with my lifting that's when it starts to hurt.     Pertinent History  Patient is a 68 y/o male S/P left shoulder pain caused by supraspinatus tendinosis with interstitial tearing and partial shallow articular surface tear distally, moderate tendinopathy of the subscapularis  and infraspinatus thickening, adhesive capsulitis, and bursitis. Dr. Aline Brochure has referred patient to occupational therapy  for evaluation and treatment.     Special Tests  FOTO score: 65/100    Patient Stated Goals  To decrease the pain.     Currently in Pain?  No/denies   9/10 with yardwork and when sleeping on it.       [facility] OT Assessment - 12/27/18 0955      Assessment   Medical Diagnosis  left shoulder pain    Referring Provider (OT)  Arther Abbott, MD    Onset Date/Surgical Date  --   Approximately around 2010   Hand Dominance  Right    Next MD Visit  01/31/2019    Prior Therapy  None      Precautions   Precautions  None      Restrictions   Weight Bearing Restrictions  No      Balance Screen   Has the patient fallen in the past 6 months  No      Home  Environment   Family/patient expects to be discharged to:  Private residence      Prior Function   Level of [place]  Retired    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 He began to experience pain in his left shoulder while he was still working.     Leisure  Pt enjoys doing yardwork and staying busy with side jobs.       ADL   ADL comments  Pt reports pain with lifting above his shoulder level, doing yardwork (raking, trimming trees, etc) and when he rolls over onto his shoulder at night.       Mobility   Mobility Status  Independent      Written Expression   Dominant Hand  Right      Vision - History   Baseline Vision  No visual deficits      Cognition   Overall Cognitive Status  Within Functional Limits for tasks assessed      Observation/Other Assessments   Focus on Therapeutic Outcomes (FOTO)   65/100      ROM / Strength   AROM / PROM / Strength  AROM;PROM;Strength      Palpation   Palpation comment  Min fascial restrictions in the left anterior deltoid, posterior deltoid, and upper trapezius region.       AROM   Overall AROM Comments  Assessed seated. IR/er abducted    AROM Assessment Site  Shoulder    Right/Left Shoulder  Left    Left Shoulder Flexion  130  Degrees   same as RIGHT   Left Shoulder ABduction  120 Degrees   right: 145   Left Shoulder Internal Rotation  55 Degrees   same as RIGHT   Left Shoulder External Rotation  60 Degrees   same as RIGHT     PROM   Overall PROM Comments  Assessed supine. IR/er abducted    PROM Assessment Site  Shoulder    Right/Left Shoulder  Left    Left Shoulder Flexion  160 Degrees    Left Shoulder ABduction  130 Degrees    Left Shoulder Internal Rotation  60 Degrees    Left Shoulder External Rotation  60 Degrees      Strength   Overall Strength Comments  Assessed seated. IR/er abducted    Strength Assessment Site  Shoulder    Right/Left Shoulder  Left    Left Shoulder Flexion  4-/5   with pain   Left Shoulder ABduction  5/5   no pain   Left Shoulder Internal Rotation  5/5   no pain   Left Shoulder External Rotation  5/5   with pain                     OT Education - 12/27/18 1044    Education Details  shoulder stretches: flexion, abduction, doorway. scapular strengthening (red): row, extension    Person(s) Educated  Patient    Methods  Explanation;Demonstration;Verbal cues;Handout;Tactile cues    Comprehension  Returned demonstration;Verbalized understanding       OT Short Term Goals - 12/27/18 1104      OT SHORT TERM GOAL #1   Title  Patient will be educated and independent with HEP in order to faciliate progress in therapy and allow him to return to using his left UE as his non dominant for all daily tassk with increased comfort.     Time  4    Period  Weeks    Status  New    Target Date  01/24/19      OT SHORT TERM GOAL #2   Title  Patient will increase his LUE shoulder strength and stability to 5/5 overall with less report of pain in order to be able to complete yard work tasks such as raking and tree trimming with  increased performance.     Time  4    Period  Weeks    Status  New      OT SHORT TERM GOAL #3   Title  Patient will report a decrease in pain of  5/10 or less when sleeping or completing daily tasks such as yardwork when using her LUE.     Time  4    Period  Weeks    Status  New      OT SHORT TERM GOAL #4   Title  Patient will decrease fascial restrictions in the LUE shoulder to trace amount in order to increase functional mobility needed to complete high level reaching tasks.     Time  4    Period  Weeks    Status  New               Plan - 12/27/18 1045    Clinical Impression Statement  A: patient is a 68 y/o male S/P left shoulder pain causing pain with daily use and sleep, increased fascial restrictions, decreased ROM and strength resulting in difficulty completing daily tasks required and decreased comfort level at night when attempting to gain a full night's sleep.     OT Occupational Profile and History  Detailed Assessment- Review of Records and additional review of physical, cognitive, psychosocial history related to current functional performance    Occupational performance deficits (Please refer to evaluation for details):  ADL's;Rest and Sleep;Work;Leisure    Pt will benefit from skilled therapeutic intervention in order to improve on the following performance deficits  Body Structure / Function / Physical Skills    Body Structure / Function / Physical Skills  ADL;Decreased knowledge of use of DME;Strength;Pain;UE functional use;ROM;IADL;Fascial restriction;Mobility    Rehab Potential  Excellent    Clinical Decision Making  Several treatment options, min-mod task modification necessary    Comorbidities Affecting Occupational Performance:  None    Modification or Assistance to Complete Evaluation   Min-Moderate modification of tasks or assist with assess necessary to complete eval    OT Frequency  2x / week    OT Duration  4 weeks    OT Treatment/Interventions  Self-care/ADL training;Moist Heat;DME and/or AE instruction;Therapeutic activities;Ultrasound;Therapeutic exercise;Passive range of motion;Neuromuscular  education;Cryotherapy;Electrical Stimulation;Manual Therapy;Patient/family education    Plan  P: patient will benefit from skilled OT services to increase functional performance while completing daily and housekeeping tasks while using his left UE as his non dominant. Treatment Plan: myofascial release, manual stretching, A/ROM, general shoulder and scapular strengthening. Modalities PRN.     Consulted and Agree with Plan of Care  Patient       Patient will benefit from skilled therapeutic intervention in order to improve the following deficits and impairments:     Visit Diagnosis: Other symptoms and signs involving the musculoskeletal system - Plan: Ot plan of care cert/re-cert  Chronic left shoulder pain - Plan: Ot plan of care cert/re-cert  Stiffness of left shoulder, not elsewhere classified - Plan: Ot plan of care cert/re-cert    Problem List Patient Active Problem List   Diagnosis Date Noted  . Preventative health care 12/08/2018  . Taking multiple medications for chronic disease 12/08/2018  . Abdominal aortic aneurysm (AAA) ([place]) 06/10/2018  . Encounter for examination following treatment at hospital 06/09/2018  . Hyperlipidemia LDL goal <70 04/16/2018  . Atherosclerosis of native arteries of extremity with intermittent claudication ([place]) 01/20/2018  . Vitamin D deficiency 10/16/2017  . Generalized arthritis 10/16/2017  .  Elevated PSA 04/30/2016  . Lung nodules 02/22/2015  . CAD (coronary atherosclerotic disease) 04/13/2014  . Acute bronchitis with COPD ([place]) 06/27/2010  . Low back pain with left-sided sciatica 12/19/2008  . NICOTINE ADDICTION 02/24/2008  . Essential hypertension 02/24/2008   Ailene Ravel, OTR/L,CBIS  [phone]  12/27/2018, 11:08 AM  [facility] [address] [place], [place], [address] Phone: [phone]   Fax:  [phone]  Name: Dillon [name] MRN: [id number] Date of Birth: Aug 18, 1951

## 2019-01-03 ENCOUNTER — Other Ambulatory Visit: Payer: Self-pay | Admitting: Family Medicine

## 2019-01-06 ENCOUNTER — Other Ambulatory Visit: Payer: Self-pay

## 2019-01-06 ENCOUNTER — Encounter (HOSPITAL_COMMUNITY): Payer: Self-pay

## 2019-01-06 ENCOUNTER — Ambulatory Visit (HOSPITAL_COMMUNITY): Payer: Medicare Other

## 2019-01-06 ENCOUNTER — Telehealth (HOSPITAL_COMMUNITY): Payer: Self-pay

## 2019-01-06 DIAGNOSIS — G8929 Other chronic pain: Secondary | ICD-10-CM

## 2019-01-06 DIAGNOSIS — R29898 Other symptoms and signs involving the musculoskeletal system: Secondary | ICD-10-CM | POA: Diagnosis not present

## 2019-01-06 DIAGNOSIS — M25612 Stiffness of left shoulder, not elsewhere classified: Secondary | ICD-10-CM

## 2019-01-06 DIAGNOSIS — M25512 Pain in left shoulder: Secondary | ICD-10-CM

## 2019-01-06 NOTE — Therapy (Signed)
[place], [place], [address] Phone: [phone]   Fax:  [phone]  Occupational Therapy Treatment Reassessment/discharge Patient Details  Name: Dillon [name] MRN: [id number] Date of Birth: 1950-11-11 Referring Provider (OT): Arther Abbott, MD   Encounter Date: 01/06/2019  OT End of Session - 01/06/19 1107    Visit Number  2    Number of Visits  8    Date for OT Re-Evaluation  01/24/19    Authorization Type  1) Medicaid is primary 2) medicare is secondary (only covers hospital services)    Authorization Time Period  Medicaid approved 3 visits (12/28/18-01/10/19)    Authorization - Visit Number  1    Authorization - Number of Visits  3    OT Start Time  0908   reassessment and discharge   OT Stop Time  0933    OT Time Calculation (min)  25 min    Activity Tolerance  Patient tolerated treatment well    Behavior During Therapy  [facility] for tasks assessed/performed       Past Medical History:  Diagnosis Date  . Arthritis    2012  . Asthma    childhood  . COPD (chronic obstructive pulmonary disease) ([place])   . Hyperlipidemia    1990  . Hypertension    1990  . Nicotine addiction   . PAD (peripheral artery disease) ([place]) 2015    Past Surgical History:  Procedure Laterality Date  . KNEE ARTHROSCOPY     both knees   . LOWER EXTREMITY ANGIOGRAPHY N/A 02/04/2018   Procedure: LOWER EXTREMITY ANGIOGRAPHY;  Surgeon: Conrad Landrum, MD;  Location: [facility];  Service: Cardiovascular;  Laterality: N/A;    There were no vitals filed for this visit.  Subjective Assessment - 01/06/19 1101    Subjective   S: I did yardwork the other day and it did not hurt.     Currently in Pain?  No/denies         [facility] OT Assessment - 01/06/19 1103      Assessment   Medical Diagnosis  left shoulder pain    Referring Provider (OT)  Arther Abbott, MD      Precautions   Precautions  None      ROM / Strength    AROM / PROM / Strength  AROM;PROM;Strength      Palpation   Palpation comment  zero fascial restrictions in the left upper arm, trapezius, and scapularis region.       AROM   Overall AROM Comments  Assessed seated. IR/er abducted    AROM Assessment Site  Shoulder    Right/Left Shoulder  Left    Left Shoulder Flexion  145 Degrees   previous: 130   Left Shoulder ABduction  154 Degrees   previous: 120   Left Shoulder Internal Rotation  75 Degrees   previous: 55   Left Shoulder External Rotation  72 Degrees   previous: 60     PROM   Overall PROM Comments  Assessed supine. IR/er abducted    PROM Assessment Site  Shoulder    Right/Left Shoulder  Left    Left Shoulder Flexion  166 Degrees   previous: 160   Left Shoulder ABduction  160 Degrees   previous: 130   Left Shoulder Internal Rotation  66 Degrees   previous: 60   Left Shoulder External Rotation  84 Degrees   previous: 60     Strength  Overall Strength Comments  Assessed seated. IR/er abducted    Strength Assessment Site  Shoulder    Right/Left Shoulder  Left    Left Shoulder Flexion  4+/5   previous: 4-/5   Left Shoulder ABduction  5/5   previous: same   Left Shoulder Internal Rotation  5/5   previous: same   Left Shoulder External Rotation  5/5   previous: same                      OT Education - 01/06/19 1106    Education Details  Reviewed progress in therapy, reviewed therapy goals. discussed continuing with HEP at home. Pt was provided with green and blue theraband to progress to when appropriate. Patient is to continue with established HEP.     Person(s) Educated  Patient    Methods  Explanation    Comprehension  Verbalized understanding       OT Short Term Goals - 01/06/19 1108      OT SHORT TERM GOAL #1   Title  Patient will be educated and independent with HEP in order to faciliate progress in therapy and allow him to return to using his left UE as his non dominant for all daily tassk  with increased comfort.     Time  4    Period  Weeks    Status  Achieved    Target Date  01/24/19      OT SHORT TERM GOAL #2   Title  Patient will increase his LUE shoulder strength and stability to 5/5 overall with less report of pain in order to be able to complete yard work tasks such as raking and tree trimming with increased performance.     Time  4    Period  Weeks    Status  Achieved      OT SHORT TERM GOAL #3   Title  Patient will report a decrease in pain of 5/10 or less when sleeping or completing daily tasks such as yardwork when using her LUE.     Time  4    Period  Weeks    Status  Achieved      OT SHORT TERM GOAL #4   Title  Patient will decrease fascial restrictions in the LUE shoulder to trace amount in order to increase functional mobility needed to complete high level reaching tasks.     Time  4    Period  Weeks    Status  Achieved               Plan - 01/06/19 1108    Clinical Impression Statement  A: Patient presents to clinic with reports of no pain. He has been working on his HEP independently at home. He reports no pain when completing yardwork. He has refrained from sleeping on it. Reassessment completed this date. Patient has met all therapy goals. His strength goal is 5/5 and flexion was tested at a 4+/5. patient feels comfortable continueing to work on strengthening independently at home. He was provided with a green and blue theraband to progress to when appropriate. Pt is in agreement with discharge.     Body Structure / Function / Physical Skills  ADL;Decreased knowledge of use of DME;Strength;Pain;UE functional use;ROM;IADL;Fascial restriction;Mobility    Plan  P: D/C from OT services with HEP. Pt will follow up as needed.     Consulted and Agree with Plan of Care  Patient  Patient will benefit from skilled therapeutic intervention in order to improve the following deficits and impairments:  Body Structure / Function / Physical  Skills  Visit Diagnosis: Other symptoms and signs involving the musculoskeletal system  Chronic left shoulder pain  Stiffness of left shoulder, not elsewhere classified    Problem List Patient Active Problem List   Diagnosis Date Noted  . Preventative health care 12/08/2018  . Taking multiple medications for chronic disease 12/08/2018  . Abdominal aortic aneurysm (AAA) ([place]) 06/10/2018  . Encounter for examination following treatment at hospital 06/09/2018  . Hyperlipidemia LDL goal <70 04/16/2018  . Atherosclerosis of native arteries of extremity with intermittent claudication ([place]) 01/20/2018  . Vitamin D deficiency 10/16/2017  . Generalized arthritis 10/16/2017  . Elevated PSA 04/30/2016  . Lung nodules 02/22/2015  . CAD (coronary atherosclerotic disease) 04/13/2014  . Acute bronchitis with COPD ([place]) 06/27/2010  . Low back pain with left-sided sciatica 12/19/2008  . NICOTINE ADDICTION 02/24/2008  . Essential hypertension 02/24/2008     OCCUPATIONAL THERAPY DISCHARGE SUMMARY  Visits from Start of Care: 2  Current functional level related to goals / functional outcomes: See above   Remaining deficits: See above   Education / Equipment: See above Plan: Patient agrees to discharge.  Patient goals were met. Patient is being discharged due to meeting the stated rehab goals.  ?????          Ailene Ravel, OTR/L,CBIS  [phone]  01/06/2019, 11:12 AM  [facility] [address] [place], [place], [address] Phone: [phone]   Fax:  [phone]  Name: Dillon [name] MRN: [id number] Date of Birth: 02-Sep-1951

## 2019-01-06 NOTE — Telephone Encounter (Signed)
When pt was checked out there was not a AOB scanned in-  -computers were down. NF 01/06/2019

## 2019-01-11 ENCOUNTER — Ambulatory Visit (HOSPITAL_COMMUNITY): Payer: Medicare Other

## 2019-01-14 ENCOUNTER — Encounter (HOSPITAL_COMMUNITY): Payer: Medicaid Other

## 2019-01-19 ENCOUNTER — Encounter (HOSPITAL_COMMUNITY): Payer: Medicaid Other | Admitting: Occupational Therapy

## 2019-01-20 ENCOUNTER — Encounter (HOSPITAL_COMMUNITY): Payer: Medicaid Other | Admitting: Occupational Therapy

## 2019-01-21 ENCOUNTER — Other Ambulatory Visit: Payer: Self-pay

## 2019-01-21 NOTE — Telephone Encounter (Signed)
X °

## 2019-01-24 MED ORDER — HYDROCODONE-ACETAMINOPHEN 5-325 MG PO TABS: ORAL_TABLET | ORAL | 0 refills | Status: DC

## 2019-01-25 ENCOUNTER — Ambulatory Visit: Payer: Medicaid Other | Admitting: Cardiology

## 2019-01-26 ENCOUNTER — Encounter (HOSPITAL_COMMUNITY): Payer: Medicaid Other

## 2019-01-28 ENCOUNTER — Encounter (HOSPITAL_COMMUNITY): Payer: Medicaid Other

## 2019-01-31 ENCOUNTER — Encounter: Payer: Self-pay | Admitting: Orthopedic Surgery

## 2019-01-31 ENCOUNTER — Ambulatory Visit (INDEPENDENT_AMBULATORY_CARE_PROVIDER_SITE_OTHER): Payer: Medicaid Other | Admitting: Orthopedic Surgery

## 2019-01-31 ENCOUNTER — Telehealth: Payer: Self-pay | Admitting: *Deleted

## 2019-01-31 ENCOUNTER — Other Ambulatory Visit: Payer: Self-pay

## 2019-01-31 DIAGNOSIS — M7502 Adhesive capsulitis of left shoulder: Secondary | ICD-10-CM | POA: Diagnosis not present

## 2019-01-31 DIAGNOSIS — M75112 Incomplete rotator cuff tear or rupture of left shoulder, not specified as traumatic: Secondary | ICD-10-CM

## 2019-01-31 NOTE — Progress Notes (Signed)
I connected with Dillon [name] on 01/31/19 at  3:40 PM EDT by telephone and verified that I am speaking with the correct person using two identifiers.   I discussed the limitations, risks, security and privacy concerns of performing an evaluation and management service by telephone and the availability of in person appointments. I also discussed with the patient that there may be a patient responsible charge related to this service. The patient expressed understanding and agreed to proceed.  As noted in the body of the record here below the patient has a chief complaint of left shoulder pain was referred to [place] for possible surgery by intraoffice consultation however we decided to go with nonoperative treatment physical therapy which the patient completed and is now on a home exercise program  He says although he has some good days and bad days he is overall much better and thinks he can manage without surgery  So we have recommended that he continue with a home exercise program and call us if his shoulder gets any worse  Encounter Diagnoses  Name Primary?  Marland Kitchen Nontraumatic incomplete tear of left rotator cuff Yes  . Adhesive capsulitis of left shoulder      69 year old male referred for possible shoulder surgery on his left shoulder.  He said he worked at [employer] at some older and many of the employees there had difficulties with her shoulder.  Back in 2003 started having discomfort he went to his employee provided nurse never had any major treatment but saw Dr. Luna Glasgow after that and has been following him for years.   He had a recent MRI which showed he had moderate to significant supraspinatus tendinopathy and tendinosis with interstitial tearing and partial shallow articular surface tear distally without full-thickness or retracted tear there was moderate tendinopathy of the subscapularis and infraspinatus thickening of the capsular structures in the axillary resource consistent with  adhesive capsulitis and synovitis biceps tendon was intact glenoid labrum intact he had AC joint arthritis and some bursitis.   He remain only complains of pain at night which wakes him up when he rolls on his side he has difficulty when working in the yard he says other than that he does pretty well.  He takes a hot shower uses some icy hot he gets along fairly well   Previous treatment includes injection and oral medication including opioids but he has not had physical therapy   His pain is rated as moderate and a dull ache PLAN:    [name] and I talked about his condition.  He has a partial articular surface tear of his rotator cuff with tendinosis and tendinitis he also has adhesive capsulitis   We opted at this time for him to undergo a course of physical therapy and see if he improves if he does not we can proceed with rotator cuff repair   He probably needs a medical consult prior to surgery based on his medical history.  I would do an arthroscopy of the left shoulder and then takedown his partial tear and complete the tear with complete rotator cuff repair with suture anchors No orders of the defined types were placed in this encounter.     Arther Abbott, MD 12/20/2018 10:22 AM    I discussed the assessment and treatment plan with the patient. The patient was provided an opportunity to ask questions and all were answered. The patient agreed with the plan and demonstrated an understanding of the instructions.   The  patient was advised to call back or seek an in-person evaluation if the symptoms worsen or if the condition fails to improve as anticipated.  I provided 5 minutes of non-face-to-face time during this encounter.

## 2019-01-31 NOTE — Telephone Encounter (Signed)
Called pt to r/s procedure, NA and no VM

## 2019-01-31 NOTE — Telephone Encounter (Signed)
Spoke with patient fiance Geni Bers (on dpr). She is aware we are rescheduling patient procedure. He is now on for 6/30 at 2pm. I will mail new instructions/pre-op.

## 2019-02-08 ENCOUNTER — Ambulatory Visit: Payer: Medicaid Other | Admitting: Family Medicine

## 2019-02-17 ENCOUNTER — Encounter: Payer: Self-pay | Admitting: Family Medicine

## 2019-02-17 ENCOUNTER — Ambulatory Visit (INDEPENDENT_AMBULATORY_CARE_PROVIDER_SITE_OTHER): Payer: Medicaid Other | Admitting: Family Medicine

## 2019-02-17 ENCOUNTER — Other Ambulatory Visit: Payer: Self-pay

## 2019-02-17 VITALS — BP 145/89 | Ht 74.0 in | Wt 164.0 lb

## 2019-02-17 DIAGNOSIS — E785 Hyperlipidemia, unspecified: Secondary | ICD-10-CM | POA: Diagnosis not present

## 2019-02-17 DIAGNOSIS — G8929 Other chronic pain: Secondary | ICD-10-CM

## 2019-02-17 DIAGNOSIS — I10 Essential (primary) hypertension: Secondary | ICD-10-CM

## 2019-02-17 DIAGNOSIS — I714 Abdominal aortic aneurysm, without rupture, unspecified: Secondary | ICD-10-CM

## 2019-02-17 DIAGNOSIS — F17200 Nicotine dependence, unspecified, uncomplicated: Secondary | ICD-10-CM | POA: Diagnosis not present

## 2019-02-17 DIAGNOSIS — R972 Elevated prostate specific antigen [PSA]: Secondary | ICD-10-CM

## 2019-02-17 DIAGNOSIS — M5442 Lumbago with sciatica, left side: Secondary | ICD-10-CM

## 2019-02-17 DIAGNOSIS — Z720 Tobacco use: Secondary | ICD-10-CM

## 2019-02-17 MED ORDER — TIZANIDINE HCL 4 MG PO TABS: 4.0000 mg | ORAL_TABLET | Freq: Three times a day (TID) | ORAL | 3 refills | Status: DC | PRN

## 2019-02-17 MED ORDER — HYDROCODONE-ACETAMINOPHEN 5-325 MG PO TABS: ORAL_TABLET | ORAL | 0 refills | Status: DC

## 2019-02-17 NOTE — Patient Instructions (Signed)
F/U end August, please call if you need me before  Please work on quitting smoking, even at his difficult time, you are now down to 6 cigarettes daily, and hopefully will start at 5 effective today   New muscle relaxant is prescribed  You are referred for Urology follow up , ( not due before Summer) re elevated PSA  Pls get fasting lipid, cmp and EGFr in the next in May, 2nd or 3rd week ([facility])  You wuill be referre for screening chest scan when due in The Summer.  Continue to work with your case worker so that your concerns re coverage for your tests is addressed  Thanks for choosing [facility], we consider it a privelige to serve you.

## 2019-02-17 NOTE — Progress Notes (Signed)
Virtual Visit via Telephone Note  I connected with [name] on 02/17/19 at 10:20 AM EDT by telephone and verified that I am speaking with the correct person using two identifiers.   I discussed the limitations, risks, security and privacy concerns of performing an evaluation and management service by telephone and the availability of in person appointments. I also discussed with the patient that there may be a patient responsible charge related to this service. The patient expressed understanding and agreed to proceed. I am in my home and the patient is at his home, visual contact is not possible though desired   History of Present Illness: F/u chronic problems C/o increased back spasm with no response to the medication e is already taking Denies recent fever or chills. Denies sinus pressure, nasal congestion, ear pain or sore throat. Denies chest congestion, productive cough or wheezing. Denies chest pains, palpitations and leg swelling Denies abdominal pain, nausea, vomiting,diarrhea or constipation.   Denies dysuria, frequency, hesitancy or incontinence. Denies  Uncontrolled  joint pain, swelling and limitation in mobility. Denies headaches, seizures, numbness, or tingling. Denies depression, anxiety or insomnia. Denies skin break down or rash.       Observations/Objective: BP (!) 145/89   Ht 6\' 2"  (1.88 m)   Wt 164 lb (74.4 kg)   BMI 21.06 kg/m    Assessment and Plan: Elevated PSA Refer for annual f/u witth urology  Essential hypertension Not at goal, will re evaluate in office in 4 months DASH diet and commitment to daily physical activity for a minimum of 30 minutes discussed and encouraged, as a part of hypertension management. The importance of attaining a healthy weight is also discussed.  BP/Weight 02/17/2019 12/20/2018 12/08/2018 12/07/2018 11/25/2018 0/94/7096 12/04/3660  Systolic BP 947 654 650 354 656 812 751  Diastolic BP 89 89 82 79 86 78 92  Wt. (Lbs) 164  164 164 164 162 160.94 160  BMI 21.06 20.77 20.77 20.5 20.25 20.12 20       NICOTINE ADDICTION Asked:confirms currently smokes cigarettes on avg 6 /day Assess: Unwilling to quit but cutting back Advise: needs to QUIT to reduce risk of cancer, cardio and cerebrovascular disease Assist: counseled for 5 minutes and literature provided Arrange: follow up in 3 months   Low back pain with left-sided sciatica Chronic managed by orthopedics, however reports increased back spasm, will start new medication for this  Hyperlipidemia LDL goal <70 Hyperlipidemia:Low fat diet discussed and encouraged.   Lipid Panel  Lab Results  Component Value Date   CHOL 190 04/15/2018   HDL 61 04/15/2018   LDLCALC 113 (H) 04/15/2018   TRIG 74 04/15/2018   CHOLHDL 3.1 04/15/2018   Not at goal, updated lab needed for next visit    Abdominal aortic aneurysm (AAA) ([place]) [place] of aorta in 01/2020 per radiology recommendation    Follow Up Instructions:    I discussed the assessment and treatment plan with the patient. The patient was provided an opportunity to ask questions and all were answered. The patient agreed with the plan and demonstrated an understanding of the instructions.   The patient was advised to call back or seek an in-person evaluation if the symptoms worsen or if the condition fails to improve as anticipated.  I provided 240minutes of non-face-to-face time during this encounter.   Tula Nakayama, MD

## 2019-02-17 NOTE — Assessment & Plan Note (Signed)
Refer for annual f/u witth urology

## 2019-02-19 NOTE — Assessment & Plan Note (Signed)
Asked:confirms currently smokes cigarettes on avg 6 /day Assess: Unwilling to quit but cutting back Advise: needs to QUIT to reduce risk of cancer, cardio and cerebrovascular disease Assist: counseled for 5 minutes and literature provided Arrange: follow up in 3 months

## 2019-02-19 NOTE — Assessment & Plan Note (Signed)
Not at goal, will re evaluate in office in 4 months DASH diet and commitment to daily physical activity for a minimum of 30 minutes discussed and encouraged, as a part of hypertension management. The importance of attaining a healthy weight is also discussed.  BP/Weight 02/17/2019 12/20/2018 12/08/2018 12/07/2018 11/25/2018 7/50/5183 12/29/8249  Systolic BP 898 421 031 281 188 677 373  Diastolic BP 89 89 82 79 86 78 92  Wt. (Lbs) 164 164 164 164 162 160.94 160  BMI 21.06 20.77 20.77 20.5 20.25 20.12 20

## 2019-02-19 NOTE — Assessment & Plan Note (Signed)
Chronic managed by orthopedics, however reports increased back spasm, will start new medication for this

## 2019-02-19 NOTE — Assessment & Plan Note (Signed)
Hyperlipidemia:Low fat diet discussed and encouraged.   Lipid Panel  Lab Results  Component Value Date   CHOL 190 04/15/2018   HDL 61 04/15/2018   LDLCALC 113 (H) 04/15/2018   TRIG 74 04/15/2018   CHOLHDL 3.1 04/15/2018   Not at goal, updated lab needed for next visit

## 2019-02-19 NOTE — Assessment & Plan Note (Signed)
[place] of aorta in 01/2020 per radiology recommendation

## 2019-03-05 ENCOUNTER — Other Ambulatory Visit: Payer: Self-pay | Admitting: Family Medicine

## 2019-03-05 DIAGNOSIS — I10 Essential (primary) hypertension: Secondary | ICD-10-CM

## 2019-03-07 ENCOUNTER — Telehealth: Payer: Self-pay | Admitting: *Deleted

## 2019-03-07 NOTE — Telephone Encounter (Signed)
FYI to EG 

## 2019-03-07 NOTE — Telephone Encounter (Signed)
Pt called in and wanted to cancel his pre-admission screening on 04/21/2019 and procedure on 04/26/2019 at [facility].  He said that his insurance company would not cover it.  He said that he would have cancelled due to Lena 19 anyway. He said he may eventually call us back in the future.  Endo notified.

## 2019-03-09 ENCOUNTER — Other Ambulatory Visit (HOSPITAL_COMMUNITY): Payer: Medicaid Other

## 2019-03-10 NOTE — Telephone Encounter (Signed)
Noted  

## 2019-03-17 ENCOUNTER — Other Ambulatory Visit: Payer: Self-pay

## 2019-03-19 MED ORDER — HYDROCODONE-ACETAMINOPHEN 5-325 MG PO TABS: ORAL_TABLET | ORAL | 0 refills | Status: DC

## 2019-04-05 ENCOUNTER — Other Ambulatory Visit: Payer: Self-pay | Admitting: Family Medicine

## 2019-04-21 ENCOUNTER — Other Ambulatory Visit (HOSPITAL_COMMUNITY): Payer: Medicaid Other

## 2019-04-21 ENCOUNTER — Other Ambulatory Visit: Payer: Self-pay

## 2019-04-25 MED ORDER — HYDROCODONE-ACETAMINOPHEN 5-325 MG PO TABS: ORAL_TABLET | ORAL | 0 refills | Status: DC

## 2019-04-26 ENCOUNTER — Ambulatory Visit (HOSPITAL_COMMUNITY): Admit: 2019-04-26 | Payer: Medicaid Other | Admitting: Gastroenterology

## 2019-04-26 ENCOUNTER — Encounter (HOSPITAL_COMMUNITY): Payer: Self-pay

## 2019-04-26 SURGERY — COLONOSCOPY WITH PROPOFOL
Anesthesia: Monitor Anesthesia Care

## 2019-04-27 ENCOUNTER — Other Ambulatory Visit: Payer: Self-pay | Admitting: Family Medicine

## 2019-04-27 ENCOUNTER — Telehealth: Payer: Self-pay | Admitting: Cardiology

## 2019-04-27 NOTE — Telephone Encounter (Signed)
Virtual Visit Pre-Appointment Phone Call  "(Name), I am calling you today to discuss your upcoming appointment. We are currently trying to limit exposure to the virus that causes COVID-19 by seeing patients at home rather than in the office."  1. "What is the BEST phone number to call the day of the visit?" - include this in appointment notes  2. Do you have or have access to (through a family member/friend) a smartphone with video capability that we can use for your visit?" a. If yes - list this number in appt notes as cell (if different from BEST phone #) and list the appointment type as a VIDEO visit in appointment notes b. If no - list the appointment type as a PHONE visit in appointment notes  3. Confirm consent - "In the setting of the current Covid19 crisis, you are scheduled for a (phone or video) visit with your provider on (date) at (time).  Just as we do with many in-office visits, in order for you to participate in this visit, we must obtain consent.  If you'd like, I can send this to your mychart (if signed up) or email for you to review.  Otherwise, I can obtain your verbal consent now.  All virtual visits are billed to your insurance company just like a normal visit would be.  By agreeing to a virtual visit, we'd like you to understand that the technology does not allow for your provider to perform an examination, and thus may limit your provider's ability to fully assess your condition. If your provider identifies any concerns that need to be evaluated in person, we will make arrangements to do so.  Finally, though the technology is pretty good, we cannot assure that it will always work on either your or our end, and in the setting of a video visit, we may have to convert it to a phone-only visit.  In either situation, we cannot ensure that we have a secure connection.  Are you willing to proceed?" STAFF: Did the patient verbally acknowledge consent to telehealth visit? Document  YES/NO here: Yes  4. Advise patient to be prepared - "Two hours prior to your appointment, go ahead and check your blood pressure, pulse, oxygen saturation, and your weight (if you have the equipment to check those) and write them all down. When your visit starts, your provider will ask you for this information. If you have an Apple Watch or Kardia device, please plan to have heart rate information ready on the day of your appointment. Please have a pen and paper handy nearby the day of the visit as well."  5. Give patient instructions for MyChart download to smartphone OR [email or web address] as below if video visit (depending on what platform provider is using)  6. Inform patient they will receive a phone call 15 minutes prior to their appointment time (may be from unknown caller ID) so they should be prepared to answer    TELEPHONE CALL NOTE  DESTYN SCHUYLER has been deemed a candidate for a follow-up tele-health visit to limit community exposure during the Covid-19 pandemic. I spoke with the patient via phone to ensure availability of phone/video source, confirm preferred email & phone number, and discuss instructions and expectations.  I reminded [name] to be prepared with any vital sign and/or heart rhythm information that could potentially be obtained via home monitoring, at the time of his visit. I reminded [name] to expect a phone call prior to  his visit.  Terry L Goins 04/27/2019 1:11 PM

## 2019-04-28 ENCOUNTER — Telehealth (INDEPENDENT_AMBULATORY_CARE_PROVIDER_SITE_OTHER): Payer: Medicaid Other | Admitting: Cardiology

## 2019-04-28 ENCOUNTER — Encounter: Payer: Self-pay | Admitting: Cardiology

## 2019-04-28 VITALS — Ht 74.5 in | Wt 163.0 lb

## 2019-04-28 DIAGNOSIS — I2510 Atherosclerotic heart disease of native coronary artery without angina pectoris: Secondary | ICD-10-CM

## 2019-04-28 DIAGNOSIS — I739 Peripheral vascular disease, unspecified: Secondary | ICD-10-CM

## 2019-04-28 DIAGNOSIS — E782 Mixed hyperlipidemia: Secondary | ICD-10-CM

## 2019-04-28 DIAGNOSIS — I6523 Occlusion and stenosis of bilateral carotid arteries: Secondary | ICD-10-CM

## 2019-04-28 MED ORDER — ATORVASTATIN CALCIUM 80 MG PO TABS: 80.0000 mg | ORAL_TABLET | Freq: Every day | ORAL | 3 refills | Status: DC

## 2019-04-28 NOTE — Progress Notes (Signed)
Virtual Visit via Telephone Note   This visit type was conducted due to national recommendations for restrictions regarding the COVID-19 Pandemic (e.g. social distancing) in an effort to limit this patient's exposure and mitigate transmission in our community.  Due to his co-morbid illnesses, this patient is at least at moderate risk for complications without adequate follow up.  This format is felt to be most appropriate for this patient at this time.  The patient did not have access to video technology/had technical difficulties with video requiring transitioning to audio format only (telephone).  All issues noted in this document were discussed and addressed.  No physical exam could be performed with this format.  Please refer to the patient's chart for his  consent to telehealth for [facility].   Date:  04/28/2019   ID:  Dillon [name], DOB 03-06-1951, MRN [id number]  Patient Location: Home Provider Location: Home  PCP:  Fayrene Helper, MD  Cardiologist:  Dr Carlyle Dolly MD Electrophysiologist:  None   Evaluation Performed:  Follow-Up Visit  Chief Complaint:  Follow up  History of Present Illness:    Dillon [name] is a 68 y.o. male seen today for follow up of the following medical problems.    1.CAD - CT scan chest 01/2014 with incidental finding of coronary calcification - CAD risk factors: HTN, tobacco, FH with sister stents in her 54s, hyperlipidemia - 06/2015 nuclear stress no ischemia   - no recent recent chest pain. No SOB or DOE  2. HTN - he is compliant with meds   3. HL 09/2017 TC 174 HDL 59 TG 46 LDL 101 - 03/2018 TC 190 TG 74 HDL 61 LDL 113 - he is compliant with statin  4. PAD - followed by vascular - prior workup including ABIs, lower extremity angio - no recent symptoms  5. COPD - compliant with meds  6. Carotid stenosis - 11/2017 carotid US: 62-70% RICA, LICA <35%     The patient does not have symptoms concerning for COVID-19  infection (fever, chills, cough, or new shortness of breath).    Past Medical History:  Diagnosis Date  . Arthritis    2012  . Asthma    childhood  . COPD (chronic obstructive pulmonary disease) ([place])   . Hyperlipidemia    1990  . Hypertension    1990  . Nicotine addiction   . PAD (peripheral artery disease) ([place]) 2015   Past Surgical History:  Procedure Laterality Date  . KNEE ARTHROSCOPY     both knees   . LOWER EXTREMITY ANGIOGRAPHY N/A 02/04/2018   Procedure: LOWER EXTREMITY ANGIOGRAPHY;  Surgeon: Conrad Eunice, MD;  Location: [facility];  Service: Cardiovascular;  Laterality: N/A;     Current Meds  Medication Sig  . albuterol (PROVENTIL HFA;VENTOLIN HFA) 108 (90 Base) MCG/ACT inhaler Inhale 2 puffs into the lungs every 6 (six) hours as needed for wheezing.  Marland Kitchen albuterol (PROVENTIL) (2.5 MG/3ML) 0.083% nebulizer solution Take 3 mLs (2.5 mg total) by nebulization every 4 (four) hours as needed for wheezing or shortness of breath.  Marland Kitchen amLODipine (NORVASC) 10 MG tablet TAKE 1 TABLET BY MOUTH EVERY DAY  . aspirin EC 81 MG tablet Take 81 mg by mouth daily.  . ergocalciferol (VITAMIN D2) 50000 units capsule Take 1 capsule (50,000 Units total) by mouth once a week. One capsule once weekly (Patient taking differently: Take 50,000 Units by mouth every Tuesday. )  . fluticasone furoate-vilanterol (BREO ELLIPTA) 200-25 MCG/INH  AEPB Inhale 1 puff into the lungs daily.  Marland Kitchen gabapentin (NEURONTIN) 300 MG capsule TAKE 1 CAPSULE (300 MG TOTAL) BY MOUTH AT BEDTIME.  Marland Kitchen HYDROcodone-acetaminophen (NORCO/VICODIN) 5-325 MG tablet One tablet every six hours as needed for pain.  Marland Kitchen ipratropium (ATROVENT) 0.02 % nebulizer solution Take 2.5 mLs (0.5 mg total) by nebulization every 6 (six) hours as needed for wheezing or shortness of breath.  . levalbuterol (XOPENEX) 0.63 MG/3ML nebulizer solution Take 3 mLs (0.63 mg total) by nebulization every 8 (eight) hours as needed for wheezing or shortness of  breath.  . meloxicam (MOBIC) 15 MG tablet TAKE 1 TABLET BY MOUTH ONCE DAILY AS NEEDED FOR UNCONTROLLED ARTHRITIS PAIN  . pravastatin (PRAVACHOL) 80 MG tablet TAKE 1 TABLET BY MOUTH EVERY DAY  . spironolactone (ALDACTONE) 50 MG tablet TAKE 1 TABLET BY MOUTH EVERY DAY  . tiZANidine (ZANAFLEX) 4 MG tablet TAKE 1 TABLET (4 MG TOTAL) BY MOUTH EVERY 8 (EIGHT) HOURS AS NEEDED FOR MUSCLE SPASMS  . UNABLE TO FIND Nebulizer facemask and tubing x 1  DX J44.9     Allergies:   Penicillins   Social History   Tobacco Use  . Smoking status: Current Some Day Smoker    Packs/day: 0.25    Years: 48.00    Pack years: 12.00    Types: Cigarettes    Start date: 10/16/1969  . Smokeless tobacco: Never Used  Substance Use Topics  . Alcohol use: Yes    Alcohol/week: 0.0 standard drinks    Comment: occassional , max of 72 oz pwr month  . Drug use: No     Family Hx: The patient's family history includes Diabetes in his mother; Heart attack in his mother; Hypertension in his brother, mother, sister, sister, and sister; Stroke in his brother. There is no history of Colon cancer.  ROS:   Please see the history of present illness.     All other systems reviewed and are negative.   Prior CV studies:   The following studies were reviewed today:  06/2015 exercise nuclear stress  Exercise limited by possible right leg claudication.  There was no diagnostic ST segment deviation noted during Lexiscan infusion.  Blood pressure demonstrated a hypertensive response to exercise.  There is a small defect of mild severity present in the mid inferoseptal, apical septal and apical inferior location. The defect is non-reversible. Most consistent with soft tissue attenuation, no clear evidence of scar or ischemia.  This is a low risk study.  Nuclear stress EF: 59%.   08/2018 ABI Right: Resting right ankle-brachial index indicates moderate right lower extremity arterial disease. The right toe-brachial index is  abnormal. RT great toe pressure = 55 mmHg.  Left: Resting left ankle-brachial index is within normal range. No evidence of significant left lower extremity arterial disease. The left toe-brachial index is abnormal. LT Great toe pressure = 82 mmHg.  01/2018 LE angiography  Ectatic segments suggestive of abdominal aortic aneurysm and bilateral common iliac artery aneurysms  Occluded right SFA with collateral reconstitution of proximal AT and TPT  Patent but diseased L SFA with serial stenosis in mid-segment and sub-total occlusion distally leading into intact popliteal flow into PT and peroneal  Additional studies will be needed to interrogate the anatomy of the aortoiliac arteries and bilateral femoropopliteal arteries  Right leg minimally needs R common femoral artery to distal popliteal artery bypass with popliteal artery aneurysm exclusion  11/2018 carotid US IMPRESSION: There is 50-69% stenosis in the right internal carotid artery.  Less than 50% stenosis in the left internal carotid artery.  Labs/Other Tests and Data Reviewed:    EKG:  No ECG reviewed.  Recent Labs: 11/07/2018: ALT 24; B Natriuretic Peptide 23.0; BUN 16; Creatinine, Ser 0.89; Hemoglobin 13.4; Platelets 356; Potassium 3.6; Sodium 137   Recent Lipid Panel Lab Results  Component Value Date/Time   CHOL 190 04/15/2018 08:59 AM   TRIG 74 04/15/2018 08:59 AM   HDL 61 04/15/2018 08:59 AM   CHOLHDL 3.1 04/15/2018 08:59 AM   LDLCALC 113 (H) 04/15/2018 08:59 AM    Wt Readings from Last 3 Encounters:  04/28/19 163 lb (73.9 kg)  02/17/19 164 lb (74.4 kg)  12/20/18 164 lb (74.4 kg)     Objective:    Vital Signs:  Ht 6' 2.5" (1.892 m)   Wt 163 lb (73.9 kg)   BMI 20.65 kg/m    Normal affect. Normal speech pattern and tone. Comfortable, no apparent distress. No apparent signs of distress.   ASSESSMENT & PLAN:    1. CAD - incidental findings of coronary atherosclerosis on recent chest CT, prior nuclear  stress test shows no functional ischemia - continue medical therapy  2. HTN - continue current meds  3. Hyperlipidemia - given his CAD, PAD, carotid stenosis would be aggressive with lipid control. Change pravastatin to atorvastatin 80mg  daily  4. PAD - no recent symptoms, continue medical therapy  5. Carotid bruit - no neuro symptoms - continue medical therapy. Likely repeat study at f/u, limitations given COVID-19 risks and this is not urgent   COVID-19 Education: The signs and symptoms of COVID-19 were discussed with the patient and how to seek care for testing (follow up with PCP or arrange E-visit).  The importance of social distancing was discussed today.  Time:   Today, I have spent 20  minutes with the patient with telehealth technology discussing the above problems.     Medication Adjustments/Labs and Tests Ordered: Current medicines are reviewed at length with the patient today.  Concerns regarding medicines are outlined above.   Tests Ordered: No orders of the defined types were placed in this encounter.   Medication Changes: No orders of the defined types were placed in this encounter.   Follow Up:  In Person in 6 month(s)  Signed, Carlyle Dolly, MD  04/28/2019 10:17 AM    [place]

## 2019-04-28 NOTE — Patient Instructions (Signed)
Medication Instructions:  STOP PRAVASTATIN   START ATORVASTATIN 80 MG ONCE DAILY   Labwork: NONE  Testing/Procedures: NONE  Follow-Up: Your physician wants you to follow-up in: 6 MONTHS.  You will receive a reminder letter in the mail two months in advance. If you don't receive a letter, please call our office to schedule the follow-up appointment.   Any Other Special Instructions Will Be Listed Below (If Applicable).     If you need a refill on your cardiac medications before your next appointment, please call your pharmacy.

## 2019-05-23 ENCOUNTER — Other Ambulatory Visit: Payer: Self-pay | Admitting: Family Medicine

## 2019-05-26 ENCOUNTER — Other Ambulatory Visit: Payer: Self-pay

## 2019-05-26 MED ORDER — HYDROCODONE-ACETAMINOPHEN 5-325 MG PO TABS: ORAL_TABLET | ORAL | 0 refills | Status: DC

## 2019-05-28 ENCOUNTER — Other Ambulatory Visit: Payer: Self-pay | Admitting: Family Medicine

## 2019-06-02 ENCOUNTER — Other Ambulatory Visit: Payer: Self-pay

## 2019-06-02 ENCOUNTER — Ambulatory Visit (HOSPITAL_COMMUNITY)
Admission: RE | Admit: 2019-06-02 | Discharge: 2019-06-02 | Disposition: A | Discharge status: Home / Self Care | Payer: Medicaid Other | Source: Ambulatory Visit | Attending: Acute Care | Admitting: Acute Care

## 2019-06-02 DIAGNOSIS — F17210 Nicotine dependence, cigarettes, uncomplicated: Secondary | ICD-10-CM | POA: Insufficient documentation

## 2019-06-02 DIAGNOSIS — Z122 Encounter for screening for malignant neoplasm of respiratory organs: Secondary | ICD-10-CM | POA: Insufficient documentation

## 2019-06-08 ENCOUNTER — Other Ambulatory Visit: Payer: Self-pay | Admitting: *Deleted

## 2019-06-08 DIAGNOSIS — Z122 Encounter for screening for malignant neoplasm of respiratory organs: Secondary | ICD-10-CM

## 2019-06-08 DIAGNOSIS — F17210 Nicotine dependence, cigarettes, uncomplicated: Secondary | ICD-10-CM

## 2019-06-23 ENCOUNTER — Other Ambulatory Visit: Payer: Self-pay

## 2019-06-23 ENCOUNTER — Other Ambulatory Visit: Payer: Self-pay | Admitting: Family Medicine

## 2019-06-23 ENCOUNTER — Ambulatory Visit (INDEPENDENT_AMBULATORY_CARE_PROVIDER_SITE_OTHER): Payer: Medicaid Other | Admitting: Family Medicine

## 2019-06-23 ENCOUNTER — Ambulatory Visit: Payer: Medicaid Other | Admitting: Family Medicine

## 2019-06-23 ENCOUNTER — Encounter: Payer: Self-pay | Admitting: Family Medicine

## 2019-06-23 VITALS — BP 130/78 | HR 87 | Temp 98.3°F | Resp 15 | Ht 74.0 in | Wt 164.0 lb

## 2019-06-23 DIAGNOSIS — E559 Vitamin D deficiency, unspecified: Secondary | ICD-10-CM

## 2019-06-23 DIAGNOSIS — Z8249 Family history of ischemic heart disease and other diseases of the circulatory system: Secondary | ICD-10-CM

## 2019-06-23 DIAGNOSIS — I714 Abdominal aortic aneurysm, without rupture, unspecified: Secondary | ICD-10-CM

## 2019-06-23 DIAGNOSIS — I10 Essential (primary) hypertension: Secondary | ICD-10-CM | POA: Diagnosis not present

## 2019-06-23 DIAGNOSIS — Z125 Encounter for screening for malignant neoplasm of prostate: Secondary | ICD-10-CM

## 2019-06-23 DIAGNOSIS — F17200 Nicotine dependence, unspecified, uncomplicated: Secondary | ICD-10-CM

## 2019-06-23 DIAGNOSIS — I2510 Atherosclerotic heart disease of native coronary artery without angina pectoris: Secondary | ICD-10-CM

## 2019-06-23 DIAGNOSIS — E785 Hyperlipidemia, unspecified: Secondary | ICD-10-CM

## 2019-06-23 DIAGNOSIS — Z2882 Immunization not carried out because of caregiver refusal: Secondary | ICD-10-CM

## 2019-06-23 LAB — CBC
HCT: 38.3 % — ABNORMAL LOW (ref 38.5–50.0)
Hemoglobin: 13.1 g/dL — ABNORMAL LOW (ref 13.2–17.1)
MCH: 31.9 pg (ref 27.0–33.0)
MCHC: 34.2 g/dL (ref 32.0–36.0)
MCV: 93.2 fL (ref 80.0–100.0)
MPV: 9 fL (ref 7.5–12.5)
Platelets: 362 10*3/uL (ref 140–400)
RBC: 4.11 10*6/uL — ABNORMAL LOW (ref 4.20–5.80)
RDW: 13.7 % (ref 11.0–15.0)
WBC: 6 10*3/uL (ref 3.8–10.8)

## 2019-06-23 NOTE — Patient Instructions (Signed)
F/U in March in office, call if you need me sooner  Start smoking 7 ciggs/ day, and curt down every week if possible, NEED to quit  Reconsider vaccines to protect your lungs  Labs today, CBC, lipid, cmp and EGFR, PSA and tSH  I am referring you to the  Cardiologist again because of your abnormal  chest scan and other risk factors, and back to urology  Please try  To increase watching movies, and positive interaction,  Too much negative thoughts and actions are bad for mental health, so CHOOSE HEALTHY!  Thanks for choosing [facility], we consider it a privelige to serve you.

## 2019-06-27 ENCOUNTER — Encounter: Payer: Self-pay | Admitting: Family Medicine

## 2019-06-27 DIAGNOSIS — Z2882 Immunization not carried out because of caregiver refusal: Secondary | ICD-10-CM | POA: Insufficient documentation

## 2019-06-27 MED ORDER — HYDROCODONE-ACETAMINOPHEN 5-325 MG PO TABS: ORAL_TABLET | ORAL | 0 refills | Status: DC

## 2019-06-27 NOTE — Progress Notes (Signed)
   Dillon [name]     MRN: [id number]      DOB: 1951-05-05   HPI Dillon [name] is here for follow up and re-evaluation of chronic medical conditions, medication management and review of any available recent lab and radiology data.  Preventive health is updated, specifically  Cancer screening and Immunization.   Questions or concerns regarding consultations or procedures which the PT has had in the interim are  addressed. The PT denies any adverse reactions to current medications since the last visit.  C/o increased stress due to Covid 19 and the social climate, has support from family and friends   ROS Denies recent fever or chills. Denies sinus pressure, nasal congestion, ear pain or sore throat. Denies chest congestion, productive cough or wheezing. Denies chest pains, palpitations and leg swelling Denies abdominal pain, nausea, vomiting,diarrhea or constipation.   Denies dysuria, frequency, hesitancy or incontinence. Denies uncontrolled joint pain, swelling and limitation in mobility. Denies headaches, seizures, numbness, or tingling. . Denies skin break down or rash.   PE  BP 130/78   Pulse 87   Temp 98.3 F (36.8 C) (Temporal)   Resp 15   Ht 6\' 2"  (1.88 m)   Wt 164 lb (74.4 kg)   SpO2 93%   BMI 21.06 kg/m   Patient alert and oriented and in no cardiopulmonary distress.  HEENT: No facial asymmetry, EOMI,   oropharynx pink and moist.  Neck supple no JVD, no mass.  Chest: Clear to auscultation bilaterally.Decreased air entry throughout  CVS: S1, S2 no murmurs, no S3.Regular rate.  ABD: Soft non tender.   Ext: No edema  MS: Adequate though reduced ROM spine, normal in shoulders, hips and knees.  Skin: Intact, no ulcerations or rash noted.  Psych: Good eye contact, normal affect. Memory intact not anxious or depressed appearing.  CNS: CN 2-12 intact, power,  normal throughout.no focal deficits noted.   Assessment & Plan  NICOTINE ADDICTION Asked:confirms  currently smokes cigarettes Assess: Unwilling to quit but cutting back Advise: needs to QUIT to reduce risk of cancer, cardio and cerebrovascular disease Assist: counseled for 5 minutes and literature provided Arrange: follow up in 3 months   Hyperlipidemia LDL goal <70 Hyperlipidemia:Low fat diet discussed and encouraged.   Lipid Panel  Lab Results  Component Value Date   CHOL 190 04/15/2018   HDL 61 04/15/2018   LDLCALC 113 (H) 04/15/2018   TRIG 74 04/15/2018   CHOLHDL 3.1 04/15/2018   Updated lab needed at/ before next visit.     Essential hypertension Controlled, no change in medication DASH diet and commitment to daily physical activity for a minimum of 30 minutes discussed and encouraged, as a part of hypertension management. The importance of attaining a healthy weight is also discussed.  BP/Weight 06/23/2019 04/28/2019 02/17/2019 12/20/2018 12/08/2018 12/07/2018 Q000111Q  Systolic BP [id number] - Q000111Q Q000111Q Q000111Q [id number]  Diastolic BP 78 - 89 89 82 79 86  Wt. (Lbs) 164 163 164 164 164 164 162  BMI 21.06 20.65 21.06 20.77 20.77 20.5 20.25       Abdominal aortic aneurysm (AAA) ([facility]) No c/o abdominal pain  CAD (coronary atherosclerotic disease) Has established vascular disease, ongoing nicotine use, hTN , hyperlipidemia, positive f/hof premature cAD, no specific cV procedures to date , refer to cardiology for re eval  Vaccine refused by parent Both influenza and pneumonia

## 2019-06-27 NOTE — Assessment & Plan Note (Signed)
Has established vascular disease, ongoing nicotine use, hTN , hyperlipidemia, positive f/hof premature cAD, no specific cV procedures to date , refer to cardiology for re eval

## 2019-06-27 NOTE — Assessment & Plan Note (Signed)
Controlled, no change in medication DASH diet and commitment to daily physical activity for a minimum of 30 minutes discussed and encouraged, as a part of hypertension management. The importance of attaining a healthy weight is also discussed.  BP/Weight 06/23/2019 04/28/2019 02/17/2019 12/20/2018 12/08/2018 12/07/2018 Q000111Q  Systolic BP [id number] - Q000111Q Q000111Q Q000111Q [id number]  Diastolic BP 78 - 89 89 82 79 86  Wt. (Lbs) 164 163 164 164 164 164 162  BMI 21.06 20.65 21.06 20.77 20.77 20.5 20.25

## 2019-06-27 NOTE — Assessment & Plan Note (Signed)
Asked:confirms currently smokes cigarettes Assess: Unwilling to quit but cutting back Advise: needs to QUIT to reduce risk of cancer, cardio and cerebrovascular disease Assist: counseled for 5 minutes and literature provided Arrange: follow up in 3 months  

## 2019-06-27 NOTE — Assessment & Plan Note (Signed)
Both influenza and pneumonia

## 2019-06-27 NOTE — Assessment & Plan Note (Signed)
No c/o abdominal pain

## 2019-06-27 NOTE — Assessment & Plan Note (Signed)
Hyperlipidemia:Low fat diet discussed and encouraged.   Lipid Panel  Lab Results  Component Value Date   CHOL 190 04/15/2018   HDL 61 04/15/2018   LDLCALC 113 (H) 04/15/2018   TRIG 74 04/15/2018   CHOLHDL 3.1 04/15/2018   Updated lab needed at/ before next visit.

## 2019-07-27 ENCOUNTER — Other Ambulatory Visit: Payer: Self-pay

## 2019-07-27 MED ORDER — HYDROCODONE-ACETAMINOPHEN 5-325 MG PO TABS: ORAL_TABLET | ORAL | 0 refills | Status: DC

## 2019-08-23 ENCOUNTER — Other Ambulatory Visit: Payer: Self-pay

## 2019-08-23 MED ORDER — HYDROCODONE-ACETAMINOPHEN 5-325 MG PO TABS: ORAL_TABLET | ORAL | 0 refills | Status: DC

## 2019-08-31 ENCOUNTER — Other Ambulatory Visit: Payer: Self-pay | Admitting: Family Medicine

## 2019-09-05 ENCOUNTER — Other Ambulatory Visit: Payer: Self-pay | Admitting: Family Medicine

## 2019-09-05 DIAGNOSIS — I10 Essential (primary) hypertension: Secondary | ICD-10-CM

## 2019-09-27 ENCOUNTER — Other Ambulatory Visit: Payer: Self-pay

## 2019-09-27 MED ORDER — HYDROCODONE-ACETAMINOPHEN 5-325 MG PO TABS: ORAL_TABLET | ORAL | 0 refills | Status: DC

## 2019-10-25 ENCOUNTER — Other Ambulatory Visit: Payer: Self-pay

## 2019-10-25 MED ORDER — HYDROCODONE-ACETAMINOPHEN 5-325 MG PO TABS: ORAL_TABLET | ORAL | 0 refills | Status: DC

## 2019-10-28 ENCOUNTER — Other Ambulatory Visit: Payer: Self-pay | Admitting: Family Medicine

## 2019-10-31 ENCOUNTER — Other Ambulatory Visit: Payer: Self-pay

## 2019-10-31 DIAGNOSIS — I70211 Atherosclerosis of native arteries of extremities with intermittent claudication, right leg: Secondary | ICD-10-CM

## 2019-11-01 ENCOUNTER — Encounter: Payer: Self-pay | Admitting: Vascular Surgery

## 2019-11-01 ENCOUNTER — Ambulatory Visit (HOSPITAL_COMMUNITY)
Admission: RE | Admit: 2019-11-01 | Discharge: 2019-11-01 | Disposition: A | Discharge status: Home / Self Care | Payer: Medicaid Other | Source: Ambulatory Visit | Attending: Vascular Surgery | Admitting: Vascular Surgery

## 2019-11-01 ENCOUNTER — Ambulatory Visit (INDEPENDENT_AMBULATORY_CARE_PROVIDER_SITE_OTHER): Payer: Medicaid Other | Admitting: Vascular Surgery

## 2019-11-01 ENCOUNTER — Other Ambulatory Visit: Payer: Self-pay

## 2019-11-01 VITALS — BP 144/86 | HR 68 | Temp 97.3°F | Resp 18 | Ht 74.5 in | Wt 164.0 lb

## 2019-11-01 DIAGNOSIS — I70211 Atherosclerosis of native arteries of extremities with intermittent claudication, right leg: Secondary | ICD-10-CM | POA: Diagnosis not present

## 2019-11-01 DIAGNOSIS — I70213 Atherosclerosis of native arteries of extremities with intermittent claudication, bilateral legs: Secondary | ICD-10-CM | POA: Diagnosis not present

## 2019-11-01 NOTE — Progress Notes (Signed)
Patient name: Dillon [name] MRN: [id number] DOB: 05-17-1951 Sex: male  REASON FOR VISIT: 1 year follow-up for bilateral lower extremity claudication  HPI: Dillon [name] is a 69 y.o. male with multiple medical problems including PAD, hypertension, hyperlipidemia, COPD that presents for interval one year follow-up for ongoing surveillance of his claudication.  He was previously a patient of Dr. Lianne Moris and most recently underwent right lower extremity arteriogram in April 2019.  Ultimately he was found to have a right SFA occlusion as well as a popliteal occlusion and would need a right fem to tibioperoneal trunk bypass but had no usable vein per Dr. Bridgett Larsson.  He was placed on Pletal and conservative measures instead.  On follow-up today states he is continuing to do well.  States he can ride his bicycle and walk without any claudication symptoms or burning in the calf or thigh.  He is still smoking.  No tissue loss or rest pain.  No specific concerns today.  Past Medical History:  Diagnosis Date  . Arthritis    2012  . Asthma    childhood  . COPD (chronic obstructive pulmonary disease) ([place])   . Hyperlipidemia    1990  . Hypertension    1990  . Nicotine addiction   . PAD (peripheral artery disease) ([place]) 2015    Past Surgical History:  Procedure Laterality Date  . KNEE ARTHROSCOPY     both knees   . LOWER EXTREMITY ANGIOGRAPHY N/A 02/04/2018   Procedure: LOWER EXTREMITY ANGIOGRAPHY;  Surgeon: Conrad [place], MD;  Location: [facility];  Service: Cardiovascular;  Laterality: N/A;    Family History  Problem Relation Age of Onset  . Heart attack Mother   . Diabetes Mother   . Hypertension Mother   . Stroke Brother   . Hypertension Sister   . Hypertension Sister   . Hypertension Sister   . Hypertension Brother   . Colon cancer Neg Hx     SOCIAL HISTORY: Social History   Tobacco Use  . Smoking status: Current Some Day Smoker    Packs/day: 0.25    Years: 48.00    Pack  years: 12.00    Types: Cigarettes    Start date: 10/16/1969  . Smokeless tobacco: Never Used  Substance Use Topics  . Alcohol use: Yes    Alcohol/week: 0.0 standard drinks    Comment: occassional , max of 72 oz pwr month    Allergies  Allergen Reactions  . Penicillins Hives and Other (See Comments)    Has patient had a PCN reaction causing immediate rash, facial/tongue/throat swelling, SOB or lightheadedness with hypotension: Unknown Has patient had a PCN reaction causing severe rash involving mucus membranes or skin necrosis: No Has patient had a PCN reaction that required hospitalization: Yes Has patient had a PCN reaction occurring within the last 10 years: No If all of the above answers are "NO", then may proceed with Cephalosporin use.     Current Outpatient Medications  Medication Sig Dispense Refill  . albuterol (PROVENTIL) (2.5 MG/3ML) 0.083% nebulizer solution Take 3 mLs (2.5 mg total) by nebulization every 4 (four) hours as needed for wheezing or shortness of breath. 75 mL 3  . amLODipine (NORVASC) 10 MG tablet TAKE 1 TABLET BY MOUTH EVERY DAY 90 tablet 1  . aspirin EC 81 MG tablet Take 81 mg by mouth daily.    Marland Kitchen BREO ELLIPTA 200-25 MCG/INH AEPB TAKE 1 PUFF BY MOUTH EVERY DAY 60 each 5  .  ergocalciferol (VITAMIN D2) 50000 units capsule Take 1 capsule (50,000 Units total) by mouth once a week. One capsule once weekly (Patient taking differently: Take 50,000 Units by mouth every Tuesday. ) 12 capsule 3  . gabapentin (NEURONTIN) 300 MG capsule TAKE 1 CAPSULE (300 MG TOTAL) BY MOUTH AT BEDTIME. 30 capsule 2  . HYDROcodone-acetaminophen (NORCO/VICODIN) 5-325 MG tablet One tablet every six hours as needed for pain. 100 tablet 0  . ipratropium (ATROVENT) 0.02 % nebulizer solution Take 2.5 mLs (0.5 mg total) by nebulization every 6 (six) hours as needed for wheezing or shortness of breath. 75 mL 12  . levalbuterol (XOPENEX) 0.63 MG/3ML nebulizer solution Take 3 mLs (0.63 mg total) by  nebulization every 8 (eight) hours as needed for wheezing or shortness of breath. 3 mL 12  . meloxicam (MOBIC) 15 MG tablet TAKE 1 TABLET BY MOUTH ONCE DAILY AS NEEDED FOR UNCONTROLLED ARTHRITIS PAIN 90 tablet 0  . spironolactone (ALDACTONE) 50 MG tablet TAKE 1 TABLET BY MOUTH EVERY DAY 90 tablet 1  . tiZANidine (ZANAFLEX) 4 MG tablet TAKE 1 TABLET (4 MG TOTAL) BY MOUTH EVERY 8 (EIGHT) HOURS AS NEEDED FOR MUSCLE SPASMS 60 tablet 3  . UNABLE TO FIND Nebulizer facemask and tubing x 1  DX J44.9 1 each 0  . albuterol (PROVENTIL HFA;VENTOLIN HFA) 108 (90 Base) MCG/ACT inhaler Inhale 2 puffs into the lungs every 6 (six) hours as needed for wheezing. 1 Inhaler 3  . atorvastatin (LIPITOR) 80 MG tablet Take 1 tablet (80 mg total) by mouth daily. 90 tablet 3   No current facility-administered medications for this visit.    REVIEW OF SYSTEMS:  [X]  denotes positive finding, [ ]  denotes negative finding Cardiac  Comments:  Chest pain or chest pressure:    Shortness of breath upon exertion:    Short of breath when lying flat:    Irregular heart rhythm:        Vascular    Pain in calf, thigh, or hip brought on by ambulation:    Pain in feet at night that wakes you up from your sleep:     Blood clot in your veins:    Leg swelling:         Pulmonary    Oxygen at home:    Productive cough:     Wheezing:         Neurologic    Sudden weakness in arms or legs:     Sudden numbness in arms or legs:     Sudden onset of difficulty speaking or slurred speech:    Temporary loss of vision in one eye:     Problems with dizziness:         Gastrointestinal    Blood in stool:     Vomited blood:         Genitourinary    Burning when urinating:     Blood in urine:        Psychiatric    Major depression:         Hematologic    Bleeding problems:    Problems with blood clotting too easily:        Skin    Rashes or ulcers:        Constitutional    Fever or chills:      PHYSICAL EXAM: Vitals:     11/01/19 0958  BP: (!) 144/86  Pulse: 68  Resp: 18  Temp: (!) 97.3 F (36.3 C)  TempSrc: Temporal  SpO2:  99%  Weight: 164 lb (74.4 kg)  Height: 6' 2.5" (1.892 m)    GENERAL: The patient is a well-nourished male, in no acute distress. The vital signs are documented above. CARDIAC: There is a regular rate and rhythm.  VASCULAR:  2+ palpable femoral pulse bilateral groins No palpable pedal pulses No lower extremity tissue loss. PULMONARY: There is good air exchange bilaterally without wheezing or rales. ABDOMEN: Soft and non-tender with normal pitched bowel sounds.  MUSCULOSKELETAL: There are no major deformities or cyanosis. NEUROLOGIC: No focal weakness or paresthesias are detected.   DATA:   ABIs today are 0.58 on the right monophasic and 0.87 on the left monophasic   Assessment/Plan:  69 year old male that was previously under surveillance by Dr. Bridgett Larsson for bilateral lower extremity claudication.  On follow-up today continues to deny any symptoms of claudication, rest pain, or tissue loss.  Appears that his previous claudication symptoms are well managed on Pletal.  States he does not need a new prescription at this time.  Discussed I will have him follow-up again in 1 year with repeat ABIs.  Certainly his ABIs on left have dropped slightly and the ABIs on the right are relatively stable, but in the absence of any symptoms there is no role for intervention.  Continue conservative management.  Also of note he has documentation of a 3.6 cm abdominal aortic aneurysm from 2019 on a CT.  We will get a repeat duplex next year when he returns.   Marty Heck, MD [facility] Office: [place]

## 2019-11-08 ENCOUNTER — Other Ambulatory Visit: Payer: Self-pay | Admitting: *Deleted

## 2019-11-08 DIAGNOSIS — I70213 Atherosclerosis of native arteries of extremities with intermittent claudication, bilateral legs: Secondary | ICD-10-CM

## 2019-11-08 DIAGNOSIS — I714 Abdominal aortic aneurysm, without rupture, unspecified: Secondary | ICD-10-CM

## 2019-11-22 ENCOUNTER — Other Ambulatory Visit: Payer: Self-pay

## 2019-11-22 MED ORDER — HYDROCODONE-ACETAMINOPHEN 5-325 MG PO TABS: ORAL_TABLET | ORAL | 0 refills | Status: DC

## 2019-12-22 ENCOUNTER — Other Ambulatory Visit: Payer: Self-pay | Admitting: Orthopaedic Surgery

## 2019-12-22 MED ORDER — HYDROCODONE-ACETAMINOPHEN 5-325 MG PO TABS: ORAL_TABLET | ORAL | 0 refills | Status: DC

## 2020-01-09 ENCOUNTER — Ambulatory Visit: Payer: Medicaid Other | Admitting: Family Medicine

## 2020-01-19 ENCOUNTER — Other Ambulatory Visit: Payer: Self-pay | Admitting: Orthopaedic Surgery

## 2020-01-19 MED ORDER — HYDROCODONE-ACETAMINOPHEN 5-325 MG PO TABS: ORAL_TABLET | ORAL | 0 refills | Status: DC

## 2020-01-24 ENCOUNTER — Other Ambulatory Visit: Payer: Self-pay

## 2020-01-24 ENCOUNTER — Encounter: Payer: Self-pay | Admitting: Family Medicine

## 2020-01-24 ENCOUNTER — Ambulatory Visit (INDEPENDENT_AMBULATORY_CARE_PROVIDER_SITE_OTHER): Payer: Medicaid Other | Admitting: Family Medicine

## 2020-01-24 VITALS — BP 158/84 | HR 73 | Temp 97.8°F | Ht 74.5 in | Wt 158.8 lb

## 2020-01-24 DIAGNOSIS — I714 Abdominal aortic aneurysm, without rupture, unspecified: Secondary | ICD-10-CM

## 2020-01-24 DIAGNOSIS — Z720 Tobacco use: Secondary | ICD-10-CM

## 2020-01-24 DIAGNOSIS — I10 Essential (primary) hypertension: Secondary | ICD-10-CM | POA: Diagnosis not present

## 2020-01-24 NOTE — Patient Instructions (Addendum)
  Referral to vascular for additional evaluation   If you have lab work done today you will be contacted with your lab results within the next 2 weeks.  If you have not heard from [place] then please contact us. The fastest way to get your results is to register for My Chart.   IF you received an x-ray today, you will receive an invoice from [facility]. Please contact [facility] at [phone] with questions or concerns regarding your invoice.   IF you received labwork today, you will receive an invoice from [facility]. Please contact [facility] at [phone] with questions or concerns regarding your invoice.   Our billing staff will not be able to assist you with questions regarding bills from these companies.  You will be contacted with the lab results as soon as they are available. The fastest way to get your results is to activate your My Chart account. Instructions are located on the last page of this paperwork. If you have not heard from [place] regarding the results in 2 weeks, please contact this office.

## 2020-01-24 NOTE — Progress Notes (Signed)
Established Patient Office Visit  Subjective:  Patient ID: Dillon [name], male    DOB: 14-Jul-1951  Age: 69 y.o. MRN: [id number]  CC:  Chief Complaint  Patient presents with  . Flank Pain    right flank pain/cramp been going on for a month or two. Sometime the pain ease up when I rub it. Patient stated he had a CT a while back and they said theynseen something on the right side but they did not go into detail about it    HPI Dillon [name] presents for abdominal pain-noted intermittently for 1.5-2 years. Pt states same location. Worse when working.  NO pain at rest.  No injury to area.  Pt notes pain when eating-"feels area pulsating"  Throbbing sensation-pt states massaging -pain resolves.  More intense symptoms with more frequency over the last month.  Pt with more yard work over the last month.  No heavy work. 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  Tob use 9/day(more than previous)-pt feels this will improve when COVID less restrictive. Pt using Mobic daily. Pt uses zanaflex after working in the yard.  No improvement in symptoms. Pain does not happen at night. Pt has noted when brushing tongue in the morning  HTN-pt taking Norvasc daily-bp at home 130/70  Past Medical History:  Diagnosis Date  . Arthritis    2012  . Asthma    childhood  . COPD (chronic obstructive pulmonary disease) ([place])   . Hyperlipidemia    1990  . Hypertension    1990  . Nicotine addiction   . PAD (peripheral artery disease) ([place]) 2015    Past Surgical History:  Procedure Laterality Date  . KNEE ARTHROSCOPY     both knees   . LOWER EXTREMITY ANGIOGRAPHY N/A 02/04/2018   Procedure: LOWER EXTREMITY ANGIOGRAPHY;  Surgeon: Conrad [place], MD;  Location: [facility];  Service: Cardiovascular;  Laterality: N/A;    Family History  Problem Relation Age of Onset  . Heart attack Mother   . Diabetes Mother   . Hypertension Mother   . Stroke Brother   . Hypertension Sister   .  Hypertension Sister   . Hypertension Sister   . Hypertension Brother   . Colon cancer Neg Hx     Social History   Socioeconomic History  . Marital status: Single    Spouse name: Not on file  . Number of children: Not on file  . Years of education: Not on file  . Highest education level: Not on file  Occupational History  . Not on file  Tobacco Use  . Smoking status: Current Some Day Smoker    Packs/day: 0.25    Years: 48.00    Pack years: 12.00    Types: Cigarettes    Start date: 10/16/1969  . Smokeless tobacco: Never Used  Substance and Sexual Activity  . Alcohol use: Yes    Alcohol/week: 0.0 standard drinks    Comment: occassional , max of 72 oz pwr month  . Drug use: No  . Sexual activity: Yes    Partners: Female  Other Topics Concern  . Not on file  Social History Narrative  . Not on file   Social Determinants of Health   Financial Resource Strain:   . Difficulty of Paying Living Expenses:   Food Insecurity:   . Worried About Charity fundraiser in the Last Year:   . [place] in the Last Year:  Transportation Needs:   . Film/video editor (Medical):   Marland Kitchen Lack of Transportation (Non-Medical):   Physical Activity:   . Days of Exercise per Week:   . Minutes of Exercise per Session:   Stress:   . Feeling of Stress :   Social Connections:   . Frequency of Communication with Friends and Family:   . Frequency of Social Gatherings with Friends and Family:   . Attends Religious Services:   . Active Member of Clubs or Organizations:   . Attends Archivist Meetings:   Marland Kitchen Marital Status:   Intimate Partner Violence:   . Fear of Current or Ex-Partner:   . Emotionally Abused:   Marland Kitchen Physically Abused:   . Sexually Abused:     Outpatient Medications Prior to Visit  Medication Sig Dispense Refill  . albuterol (PROVENTIL) (2.5 MG/3ML) 0.083% nebulizer solution Take 3 mLs (2.5 mg total) by nebulization every 4 (four) hours as needed for wheezing  or shortness of breath. 75 mL 3  . amLODipine (NORVASC) 10 MG tablet TAKE 1 TABLET BY MOUTH EVERY DAY 90 tablet 1  . aspirin EC 81 MG tablet Take 81 mg by mouth daily.    Marland Kitchen BREO ELLIPTA 200-25 MCG/INH AEPB TAKE 1 PUFF BY MOUTH EVERY DAY 60 each 5  . ergocalciferol (VITAMIN D2) 50000 units capsule Take 1 capsule (50,000 Units total) by mouth once a week. One capsule once weekly (Patient taking differently: Take 50,000 Units by mouth every Tuesday. ) 12 capsule 3  . gabapentin (NEURONTIN) 300 MG capsule TAKE 1 CAPSULE (300 MG TOTAL) BY MOUTH AT BEDTIME. 30 capsule 2  . HYDROcodone-acetaminophen (NORCO/VICODIN) 5-325 MG tablet One tablet every six hours as needed for pain. 100 tablet 0  . ipratropium (ATROVENT) 0.02 % nebulizer solution Take 2.5 mLs (0.5 mg total) by nebulization every 6 (six) hours as needed for wheezing or shortness of breath. 75 mL 12  . levalbuterol (XOPENEX) 0.63 MG/3ML nebulizer solution Take 3 mLs (0.63 mg total) by nebulization every 8 (eight) hours as needed for wheezing or shortness of breath. 3 mL 12  . meloxicam (MOBIC) 15 MG tablet TAKE 1 TABLET BY MOUTH ONCE DAILY AS NEEDED FOR UNCONTROLLED ARTHRITIS PAIN 90 tablet 0  . spironolactone (ALDACTONE) 50 MG tablet TAKE 1 TABLET BY MOUTH EVERY DAY 90 tablet 1  . tiZANidine (ZANAFLEX) 4 MG tablet TAKE 1 TABLET (4 MG TOTAL) BY MOUTH EVERY 8 (EIGHT) HOURS AS NEEDED FOR MUSCLE SPASMS 60 tablet 3  . UNABLE TO FIND Nebulizer facemask and tubing x 1  DX J44.9 1 each 0  . atorvastatin (LIPITOR) 80 MG tablet Take 1 tablet (80 mg total) by mouth daily. 90 tablet 3  . albuterol (PROVENTIL HFA;VENTOLIN HFA) 108 (90 Base) MCG/ACT inhaler Inhale 2 puffs into the lungs every 6 (six) hours as needed for wheezing. 1 Inhaler 3   No facility-administered medications prior to visit.    Allergies  Allergen Reactions  . Penicillins Hives and Other (See Comments)    Has patient had a PCN reaction causing immediate rash, facial/tongue/throat  swelling, SOB or lightheadedness with hypotension: Unknown Has patient had a PCN reaction causing severe rash involving mucus membranes or skin necrosis: No Has patient had a PCN reaction that required hospitalization: Yes Has patient had a PCN reaction occurring within the last 10 years: No If all of the above answers are "NO", then may proceed with Cephalosporin use.     ROS Review of Systems  Respiratory: Positive  for cough, shortness of breath and wheezing.   Cardiovascular: Negative for chest pain.  Gastrointestinal: Positive for abdominal pain. Negative for abdominal distention, blood in stool, constipation, diarrhea, nausea and vomiting.       Pt with abdominal pain 4.5/10--worse when pt weeding in his yard. Worse with cleaning flower bed.  Pt states massaging helped symptoms completely go away.   Musculoskeletal: Positive for arthralgias. Negative for gait problem, joint swelling and neck stiffness.      Objective:    Physical Exam  Constitutional: He is oriented to person, place, and time. He appears well-developed and well-nourished.  HENT:  Head: Normocephalic and atraumatic.  Eyes: Conjunctivae are normal.  Cardiovascular: Normal rate, regular rhythm, normal heart sounds and intact distal pulses.  Pulmonary/Chest: Effort normal and breath sounds normal.  Abdominal: Soft. Bowel sounds are normal. There is abdominal tenderness in the right upper quadrant.    Tenderness to palpation  Neurological: He is alert and oriented to person, place, and time.  Psychiatric: He has a normal mood and affect. His behavior is normal.    BP (!) 158/84 (BP Location: Right Arm, Patient Position: Sitting, Cuff Size: Normal)   Pulse 73   Temp 97.8 F (36.6 C) (Oral)   Ht 6' 2.5" (1.892 m)   Wt 158 lb 12.8 oz (72 kg)   SpO2 97%   BMI 20.12 kg/m  Wt Readings from Last 3 Encounters:  01/24/20 158 lb 12.8 oz (72 kg)  11/01/19 164 lb (74.4 kg)  06/23/19 164 lb (74.4 kg)     Health  Maintenance Due  Topic Date Due  . COLONOSCOPY  Never done    Lab Results  Component Value Date   TSH 1.57 04/15/2018   Lab Results  Component Value Date   WBC 6.0 06/23/2019   HGB 13.1 (L) 06/23/2019   HCT 38.3 (L) 06/23/2019   MCV 93.2 06/23/2019   PLT 362 06/23/2019   Lab Results  Component Value Date   NA 137 11/07/2018   K 3.6 11/07/2018   CO2 26 11/07/2018   GLUCOSE 99 11/07/2018   BUN 16 11/07/2018   CREATININE 0.89 11/07/2018   BILITOT 0.8 11/07/2018   ALKPHOS 56 11/07/2018   AST 26 11/07/2018   ALT 24 11/07/2018   PROT 7.5 11/07/2018   ALBUMIN 4.2 11/07/2018   CALCIUM 8.9 11/07/2018   ANIONGAP 8 11/07/2018   Lab Results  Component Value Date   CHOL 190 04/15/2018   Lab Results  Component Value Date   HDL 61 04/15/2018   Lab Results  Component Value Date   LDLCALC 113 (H) 04/15/2018   Lab Results  Component Value Date   TRIG 74 04/15/2018   Lab Results  Component Value Date   CHOLHDL 3.1 04/15/2018      Assessment & Plan:  1. Abdominal aortic aneurysm (AAA) without rupture (HCC) Right pulsating pain noted worse with movement. No pain at rest.  No improvement with mobic or zanaflex. CT noted abnormal findings 2019-vascular referral for additional evaluation  2. Essential hypertension Take medication daily-concern for elevation-pt did not take medication today. +Tob use Follow-up:   Marlyce Mcdougald Hannah Beat, MD

## 2020-01-30 ENCOUNTER — Other Ambulatory Visit: Payer: Self-pay | Admitting: Family Medicine

## 2020-02-08 ENCOUNTER — Other Ambulatory Visit: Payer: Self-pay | Admitting: *Deleted

## 2020-02-08 DIAGNOSIS — I714 Abdominal aortic aneurysm, without rupture, unspecified: Secondary | ICD-10-CM

## 2020-02-15 ENCOUNTER — Other Ambulatory Visit: Payer: Self-pay | Admitting: Orthopaedic Surgery

## 2020-02-15 ENCOUNTER — Other Ambulatory Visit: Payer: Self-pay | Admitting: Family Medicine

## 2020-02-15 MED ORDER — HYDROCODONE-ACETAMINOPHEN 5-325 MG PO TABS: ORAL_TABLET | ORAL | 0 refills | Status: DC

## 2020-02-16 ENCOUNTER — Telehealth (HOSPITAL_COMMUNITY): Payer: Self-pay

## 2020-02-16 NOTE — Telephone Encounter (Signed)

## 2020-02-17 ENCOUNTER — Ambulatory Visit (INDEPENDENT_AMBULATORY_CARE_PROVIDER_SITE_OTHER): Payer: Medicaid Other | Admitting: Physician Assistant

## 2020-02-17 ENCOUNTER — Other Ambulatory Visit: Payer: Self-pay

## 2020-02-17 ENCOUNTER — Ambulatory Visit (HOSPITAL_COMMUNITY)
Admission: RE | Admit: 2020-02-17 | Discharge: 2020-02-17 | Disposition: A | Discharge status: Home / Self Care | Payer: Medicaid Other | Source: Ambulatory Visit | Attending: Vascular Surgery | Admitting: Vascular Surgery

## 2020-02-17 VITALS — BP 137/79 | HR 85 | Resp 16 | Ht 74.5 in | Wt 155.9 lb

## 2020-02-17 DIAGNOSIS — G8929 Other chronic pain: Secondary | ICD-10-CM

## 2020-02-17 DIAGNOSIS — I714 Abdominal aortic aneurysm, without rupture, unspecified: Secondary | ICD-10-CM

## 2020-02-17 DIAGNOSIS — R1031 Right lower quadrant pain: Secondary | ICD-10-CM | POA: Diagnosis not present

## 2020-02-17 NOTE — Progress Notes (Signed)
Office Note     CC:  follow up Requesting Provider:  Fayrene Helper, MD  HPI: Dillon [name] is a 69 y.o. (April 03, 1951) male who presents for evaluation of right lower quadrant pain. The patient has a history of an approximate 3.6 cm abdominal aortic aneurysm found on CT in April 2019. He was seen in January by Dr. Carlis Abbott to evaluate peripheral arterial disease. At that time he had no complaints of abdominal pain. He states his pain has been present for 3 to 5 months and occurs approximately three times a week. The pain occurs with activity including brushing his teeth and tongue. Pain is associated with movement. Pain is alleviated by massage. He denies nausea, vomiting or change in appetite. He occasionally has back pain that is again associated with activity.  The pt is on a statin for cholesterol management.  The pt is on a daily aspirin.   Other AC:  none The pt is on CCB for hypertension.   The pt is not diabetic.   Tobacco hx: Approximately 1/4 pack of cigarettes daily  Past Medical History:  Diagnosis Date  . Arthritis    2012  . Asthma    childhood  . COPD (chronic obstructive pulmonary disease) ([place])   . Hyperlipidemia    1990  . Hypertension    1990  . Nicotine addiction   . PAD (peripheral artery disease) ([place]) 2015    Past Surgical History:  Procedure Laterality Date  . KNEE ARTHROSCOPY     both knees   . LOWER EXTREMITY ANGIOGRAPHY N/A 02/04/2018   Procedure: LOWER EXTREMITY ANGIOGRAPHY;  Surgeon: Conrad Bucklin, MD;  Location: [facility];  Service: Cardiovascular;  Laterality: N/A;    Social History   Socioeconomic History  . Marital status: Single    Spouse name: Not on file  . Number of children: Not on file  . Years of education: Not on file  . Highest education level: Not on file  Occupational History  . Not on file  Tobacco Use  . Smoking status: Current Some Day Smoker    Packs/day: 0.25    Years: 48.00    Pack years: 12.00    Types:  Cigarettes    Start date: 10/16/1969  . Smokeless tobacco: Never Used  Substance and Sexual Activity  . Alcohol use: Yes    Alcohol/week: 0.0 standard drinks    Comment: occassional , max of 72 oz pwr month  . Drug use: No  . Sexual activity: Yes    Partners: Female  Other Topics Concern  . Not on file  Social History Narrative  . Not on file   Social Determinants of Health   Financial Resource Strain:   . Difficulty of Paying Living Expenses:   Food Insecurity:   . Worried About Charity fundraiser in the Last Year:   . Arboriculturist in the Last Year:   Transportation Needs:   . Film/video editor (Medical):   Marland Kitchen Lack of Transportation (Non-Medical):   Physical Activity:   . Days of Exercise per Week:   . Minutes of Exercise per Session:   Stress:   . Feeling of Stress :   Social Connections:   . Frequency of Communication with Friends and Family:   . Frequency of Social Gatherings with Friends and Family:   . Attends Religious Services:   . Active Member of Clubs or Organizations:   . Attends Archivist Meetings:   .  Marital Status:   Intimate Partner Violence:   . Fear of Current or Ex-Partner:   . Emotionally Abused:   Marland Kitchen Physically Abused:   . Sexually Abused:     Family History  Problem Relation Age of Onset  . Heart attack Mother   . Diabetes Mother   . Hypertension Mother   . Stroke Brother   . Hypertension Sister   . Hypertension Sister   . Hypertension Sister   . Hypertension Brother   . Colon cancer Neg Hx     Current Outpatient Medications  Medication Sig Dispense Refill  . albuterol (PROVENTIL) (2.5 MG/3ML) 0.083% nebulizer solution Take 3 mLs (2.5 mg total) by nebulization every 4 (four) hours as needed for wheezing or shortness of breath. 75 mL 3  . amLODipine (NORVASC) 10 MG tablet TAKE 1 TABLET BY MOUTH EVERY DAY 90 tablet 1  . aspirin EC 81 MG tablet Take 81 mg by mouth daily.    Marland Kitchen BREO ELLIPTA 200-25 MCG/INH AEPB TAKE 1  PUFF BY MOUTH EVERY DAY 60 each 5  . ergocalciferol (VITAMIN D2) 50000 units capsule Take 1 capsule (50,000 Units total) by mouth once a week. One capsule once weekly (Patient taking differently: Take 50,000 Units by mouth every Tuesday. ) 12 capsule 3  . gabapentin (NEURONTIN) 300 MG capsule TAKE 1 CAPSULE (300 MG TOTAL) BY MOUTH AT BEDTIME. 30 capsule 2  . HYDROcodone-acetaminophen (NORCO/VICODIN) 5-325 MG tablet One tablet every six hours as needed for pain. 100 tablet 0  . ipratropium (ATROVENT) 0.02 % nebulizer solution Take 2.5 mLs (0.5 mg total) by nebulization every 6 (six) hours as needed for wheezing or shortness of breath. 75 mL 12  . levalbuterol (XOPENEX) 0.63 MG/3ML nebulizer solution Take 3 mLs (0.63 mg total) by nebulization every 8 (eight) hours as needed for wheezing or shortness of breath. 3 mL 12  . meloxicam (MOBIC) 15 MG tablet TAKE 1 TABLET BY MOUTH ONCE DAILY AS NEEDED FOR UNCONTROLLED ARTHRITIS PAIN 90 tablet 0  . spironolactone (ALDACTONE) 50 MG tablet TAKE 1 TABLET BY MOUTH EVERY DAY 90 tablet 1  . tiZANidine (ZANAFLEX) 4 MG tablet TAKE 1 TABLET (4 MG TOTAL) BY MOUTH EVERY 8 (EIGHT) HOURS AS NEEDED FOR MUSCLE SPASMS 60 tablet 3  . UNABLE TO FIND Nebulizer facemask and tubing x 1  DX J44.9 1 each 0  . atorvastatin (LIPITOR) 80 MG tablet Take 1 tablet (80 mg total) by mouth daily. 90 tablet 3   No current facility-administered medications for this visit.    Allergies  Allergen Reactions  . Penicillins Hives and Other (See Comments)    Has patient had a PCN reaction causing immediate rash, facial/tongue/throat swelling, SOB or lightheadedness with hypotension: Unknown Has patient had a PCN reaction causing severe rash involving mucus membranes or skin necrosis: No Has patient had a PCN reaction that required hospitalization: Yes Has patient had a PCN reaction occurring within the last 10 years: No If all of the above answers are "NO", then may proceed with Cephalosporin  use.      REVIEW OF SYSTEMS:   [X]  denotes positive finding, [ ]  denotes negative finding Cardiac  Comments:  Chest pain or chest pressure:    Shortness of breath upon exertion:    Short of breath when lying flat:    Irregular heart rhythm:        Vascular    Pain in calf, thigh, or hip brought on by ambulation:    Pain in feet  at night that wakes you up from your sleep:     Blood clot in your veins:    Leg swelling:         Pulmonary    Oxygen at home:    Productive cough:     Wheezing:         Neurologic    Sudden weakness in arms or legs:     Sudden numbness in arms or legs:     Sudden onset of difficulty speaking or slurred speech:    Temporary loss of vision in one eye:     Problems with dizziness:         Gastrointestinal    Blood in stool:     Vomited blood:         Genitourinary    Burning when urinating:     Blood in urine:        Psychiatric    Major depression:         Hematologic    Bleeding problems:    Problems with blood clotting too easily:        Skin    Rashes or ulcers:        Constitutional    Fever or chills:      PHYSICAL EXAMINATION:  Vitals:   02/17/20 0912  BP: 137/79  Pulse: 85  Resp: 16  SpO2: 94%  Weight: 155 lb 14.4 oz (70.7 kg)  Height: 6' 2.5" (1.892 m)    General:  WDWN in NAD; vital signs documented above Gait: No ataxia, ambulates unaided HENT: WNL, normocephalic Pulmonary: normal non-labored breathing , without Rales, rhonchi,  wheezing Cardiac: regular HR, without  Murmurs  Abdomen: soft, NT, no pulsatile masses Skin: without rashes Vascular Exam/Pulses: 2+ bilateral femoral pulses Extremities: without ischemic changes, without Gangrene , without cellulitis; without open wounds;  Musculoskeletal: no muscle wasting or atrophy  Neurologic: A&O X 3;  No focal weakness or paresthesias are detected Psychiatric:  The pt has Normal affect.  Non-Invasive Vascular Imaging:   02/17/2020 Maximal abdominal aortic  measurement today is 3 cm Right CIA 1.7 cm Left CIA 1.7 cm  CT of abdomen/pelvis performed April 2019 Aortic measurement 3.6 cm Right CIA 1.8 cm Left CIA 1.7 cm    ASSESSMENT/PLAN:: 69 y.o. male here for assessment of right lower quadrant pain and history of abdominal aortic aneurysm and small bilateral iliac artery aneurysms. The patient's pain is not referable to his aorta. Duplex imaging of his aorta today shows smaller diameter as compared to the CT scan done 2 years ago. Size of ectatic bilateral iliac arteries is unchanged as compared to 2 years ago.  Discussed routine follow-up. Also advised him to call us for significant episodic mid abdominal or back pain. We also follow him for lower extremity peripheral arterial disease and reminded him of smoking cessation, continued exercise and alerting [place] to no lower extremity pain or nonhealing wounds. He verbalizes understanding.  Aloe up with ABIs and aortic duplex in 1 year    Barbie Banner, PA-C [facility] [phone]  Clinic MD:   Donzetta Matters

## 2020-03-15 ENCOUNTER — Other Ambulatory Visit: Payer: Self-pay | Admitting: Family Medicine

## 2020-03-15 ENCOUNTER — Other Ambulatory Visit: Payer: Self-pay | Admitting: Orthopaedic Surgery

## 2020-03-15 DIAGNOSIS — I10 Essential (primary) hypertension: Secondary | ICD-10-CM

## 2020-03-15 MED ORDER — HYDROCODONE-ACETAMINOPHEN 5-325 MG PO TABS: ORAL_TABLET | ORAL | 0 refills | Status: DC

## 2020-04-10 ENCOUNTER — Ambulatory Visit: Payer: Medicaid Other | Admitting: Family Medicine

## 2020-04-11 ENCOUNTER — Other Ambulatory Visit: Payer: Self-pay

## 2020-04-11 ENCOUNTER — Ambulatory Visit (INDEPENDENT_AMBULATORY_CARE_PROVIDER_SITE_OTHER): Payer: Medicaid Other | Admitting: Family Medicine

## 2020-04-11 ENCOUNTER — Encounter: Payer: Self-pay | Admitting: Family Medicine

## 2020-04-11 VITALS — BP 138/81 | HR 56 | Temp 99.5°F | Resp 15 | Ht 73.5 in | Wt 155.1 lb

## 2020-04-11 DIAGNOSIS — E785 Hyperlipidemia, unspecified: Secondary | ICD-10-CM

## 2020-04-11 DIAGNOSIS — R972 Elevated prostate specific antigen [PSA]: Secondary | ICD-10-CM | POA: Diagnosis not present

## 2020-04-11 DIAGNOSIS — I714 Abdominal aortic aneurysm, without rupture, unspecified: Secondary | ICD-10-CM

## 2020-04-11 DIAGNOSIS — I10 Essential (primary) hypertension: Secondary | ICD-10-CM

## 2020-04-11 DIAGNOSIS — Z1211 Encounter for screening for malignant neoplasm of colon: Secondary | ICD-10-CM

## 2020-04-11 DIAGNOSIS — M5442 Lumbago with sciatica, left side: Secondary | ICD-10-CM

## 2020-04-11 DIAGNOSIS — F17200 Nicotine dependence, unspecified, uncomplicated: Secondary | ICD-10-CM

## 2020-04-11 DIAGNOSIS — E559 Vitamin D deficiency, unspecified: Secondary | ICD-10-CM

## 2020-04-11 DIAGNOSIS — G8929 Other chronic pain: Secondary | ICD-10-CM

## 2020-04-11 MED ORDER — LEVALBUTEROL HCL 0.63 MG/3ML IN NEBU: 0.6300 mg | INHALATION_SOLUTION | Freq: Three times a day (TID) | RESPIRATORY_TRACT | 12 refills | Status: DC | PRN

## 2020-04-11 NOTE — Progress Notes (Signed)
   Dillon [name]     MRN: [id number]      DOB: 12-01-1950   HPI Dillon [name] is here for follow up and re-evaluation of chronic medical conditions, medication management and review of any available recent lab and radiology data.  Preventive health is updated, specifically  Cancer screening and he needs mmunization. But got Covid vaccine just 1 week ago, so holding off now  Questions or concerns regarding consultations or procedures which the PT has had in the interim are  addressed. The PT denies any adverse reactions to current medications since the last visit.  There are no new concerns.  There are no specific complaints   ROS Denies recent fever or chills. Denies sinus pressure, nasal congestion, ear pain or sore throat. Denies chest congestion, productive cough or wheezing. Denies chest pains, palpitations and leg swelling Denies abdominal pain, nausea, vomiting,diarrhea or constipation.   Denies dysuria, frequency, hesitancy or incontinence. C/o chronic back pain and spasm with  limitation in mobility. Denies headaches, seizures, numbness, or tingling. Denies depression, anxiety or insomnia. Denies skin break down or rash.   PE  BP 138/81   Pulse (!) 56   Temp 99.5 F (37.5 C) (Temporal)   Resp 15   Ht 6' 1.5" (1.867 m)   Wt 155 lb 1.3 oz (70.3 kg)   SpO2 100%   BMI 20.18 kg/m   Patient alert and oriented and in no cardiopulmonary distress.  HEENT: No facial asymmetry, EOMI,     Neck supple .  Chest: Clear to auscultation bilaterally.  CVS: S1, S2 no murmurs, no S3.Regular rate.  ABD: Soft non tender.   Ext: No edema  MS: decreased  ROM spine,normal in  shoulders, hips and knees.  Skin: Intact, no ulcerations or rash noted.  Psych: Good eye contact, normal affect. Memory intact not anxious or depressed appearing.  CNS: CN 2-12 intact, power,  normal throughout.no focal deficits noted.   Assessment & Plan  Essential hypertension Controlled, no change in  medication DASH diet and commitment to daily physical activity for a minimum of 30 minutes discussed and encouraged, as a part of hypertension management. The importance of attaining a healthy weight is also discussed.  BP/Weight 04/11/2020 02/17/2020 01/24/2020 11/01/2019 06/23/2019 04/28/2019 6/55/3748  Systolic BP 270 786 754 492 010 - 071  Diastolic BP 81 79 84 86 78 - 89  Wt. (Lbs) 155.08 155.9 158.8 164 164 163 164  BMI 20.18 19.75 20.12 20.77 21.06 20.65 21.06       NICOTINE ADDICTION Asked:confirms currently smokes cigarettes on avg 5/ day Assess: Unwilling to quit and is not  cutting back Advise: needs to QUIT to reduce risk of cancer, cardio and cerebrovascular disease Assist: counseled for 5 minutes  Arrange: follow up in 3 months   Low back pain with left-sided sciatica Chronic with disc disease, managed by Ortho with muscle relaxant and nmarcotic medicatiom  Elevated PSA rept test past due and will likeley need Urology re evaluation

## 2020-04-11 NOTE — Assessment & Plan Note (Signed)
Controlled, no change in medication DASH diet and commitment to daily physical activity for a minimum of 30 minutes discussed and encouraged, as a part of hypertension management. The importance of attaining a healthy weight is also discussed.  BP/Weight 04/11/2020 02/17/2020 01/24/2020 11/01/2019 06/23/2019 04/28/2019 04/25/1600  Systolic BP 093 235 573 220 254 - 270  Diastolic BP 81 79 84 86 78 - 89  Wt. (Lbs) 155.08 155.9 158.8 164 164 163 164  BMI 20.18 19.75 20.12 20.77 21.06 20.65 21.06

## 2020-04-11 NOTE — Patient Instructions (Addendum)
Wellness due please schedule  F/u with MD in 3 months, call if you need me sooner, prevnar at that visit  Fasting cBC, lipid, cmp and eGFR, tSH, pSA and vit d in the next 1 to 7 days  Now smoking 4 ciggs/ day, please work on quitting  Testing for colon cancer, with the FIT test done from home is being arranged, Nurse will explain at checkout  Lung cancer screening test due in August to be scheduled  You are referred for repeat US to re evaluate your Aortic Aneurysm  Thanks for choosing [facility], we consider it a privelige to serve you.

## 2020-04-11 NOTE — Assessment & Plan Note (Signed)
Chronic with disc disease, managed by Ortho with muscle relaxant and nmarcotic medicatiom

## 2020-04-11 NOTE — Assessment & Plan Note (Signed)
rept test past due and will likeley need Urology re evaluation

## 2020-04-11 NOTE — Assessment & Plan Note (Signed)
Asked:confirms currently smokes cigarettes on avg 5/ day Assess: Unwilling to quit and is not  cutting back Advise: needs to QUIT to reduce risk of cancer, cardio and cerebrovascular disease Assist: counseled for 5 minutes  Arrange: follow up in 3 months

## 2020-04-12 ENCOUNTER — Telehealth: Payer: Self-pay

## 2020-04-12 NOTE — Telephone Encounter (Signed)
Left message for patient letting him know that his appointment is Jun 25th. Arrive at [facility] at 9:15am NPO at midnight night before

## 2020-04-17 ENCOUNTER — Encounter (HOSPITAL_COMMUNITY): Payer: Self-pay | Admitting: *Deleted

## 2020-04-17 ENCOUNTER — Other Ambulatory Visit: Payer: Self-pay | Admitting: Family Medicine

## 2020-04-17 ENCOUNTER — Telehealth: Payer: Self-pay | Admitting: Orthopaedic Surgery

## 2020-04-17 DIAGNOSIS — R972 Elevated prostate specific antigen [PSA]: Secondary | ICD-10-CM

## 2020-04-17 MED ORDER — HYDROCODONE-ACETAMINOPHEN 5-325 MG PO TABS: ORAL_TABLET | ORAL | 0 refills | Status: DC

## 2020-04-17 MED ORDER — ERGOCALCIFEROL 1.25 MG (50000 UT) PO CAPS: 50000.0000 [IU] | ORAL_CAPSULE | ORAL | 2 refills | Status: DC

## 2020-04-17 NOTE — Telephone Encounter (Signed)
Patient requests refill on Hydrocodone/Acetaminophen 5-325  Mgs.  Qty  100  Sig: One tablet every six hours as needed for pain.  Patient uses [facility]

## 2020-04-17 NOTE — Progress Notes (Signed)
Attempted to contact patient today to schedule him for [facility] and LDCT.  I left a detailed VM and asked that he return my call.

## 2020-04-18 ENCOUNTER — Other Ambulatory Visit: Payer: Self-pay | Admitting: Orthopaedic Surgery

## 2020-04-18 LAB — COMPLETE METABOLIC PANEL WITH GFR
AG Ratio: 1.6 (calc) (ref 1.0–2.5)
ALT: 14 U/L (ref 9–46)
AST: 25 U/L (ref 10–35)
Albumin: 4.6 g/dL (ref 3.6–5.1)
Alkaline phosphatase (APISO): 93 U/L (ref 35–144)
BUN: 17 mg/dL (ref 7–25)
CO2: 28 mmol/L (ref 20–32)
Calcium: 10.2 mg/dL (ref 8.6–10.3)
Chloride: 98 mmol/L (ref 98–110)
Creat: 1.05 mg/dL (ref 0.70–1.25)
GFR, Est African American: 85 mL/min/{1.73_m2} (ref >=60)
GFR, Est Non African American: 73 mL/min/{1.73_m2} (ref >=60)
Globulin: 2.9 g/dL (calc) (ref 1.9–3.7)
Glucose, Bld: 106 mg/dL — ABNORMAL HIGH (ref 65–99)
Potassium: 4.7 mmol/L (ref 3.5–5.3)
Sodium: 137 mmol/L (ref 135–146)
Total Bilirubin: 0.6 mg/dL (ref 0.2–1.2)
Total Protein: 7.5 g/dL (ref 6.1–8.1)

## 2020-04-18 LAB — TSH: TSH: 3.17 mIU/L (ref 0.40–4.50)

## 2020-04-18 LAB — CBC
HCT: 40.1 % (ref 38.5–50.0)
Hemoglobin: 13.8 g/dL (ref 13.2–17.1)
MCH: 31.8 pg (ref 27.0–33.0)
MCHC: 34.4 g/dL (ref 32.0–36.0)
MCV: 92.4 fL (ref 80.0–100.0)
MPV: 8.4 fL (ref 7.5–12.5)
Platelets: 431 10*3/uL — ABNORMAL HIGH (ref 140–400)
RBC: 4.34 10*6/uL (ref 4.20–5.80)
RDW: 13.3 % (ref 11.0–15.0)
WBC: 5.9 10*3/uL (ref 3.8–10.8)

## 2020-04-18 LAB — LIPID PANEL
Cholesterol: 199 mg/dL (ref <200)
HDL: 72 mg/dL (ref >=40)
LDL Cholesterol (Calc): 110 mg/dL (calc) — ABNORMAL HIGH
Non-HDL Cholesterol (Calc): 127 mg/dL (calc) (ref <130)
Total CHOL/HDL Ratio: 2.8 (calc) (ref <5.0)
Triglycerides: 83 mg/dL (ref <150)

## 2020-04-18 LAB — VITAMIN D 25 HYDROXY (VIT D DEFICIENCY, FRACTURES): Vit D, 25-Hydroxy: 23 ng/mL — ABNORMAL LOW (ref 30–100)

## 2020-04-18 LAB — TEST AUTHORIZATION

## 2020-04-18 LAB — PSA: PSA: 6.8 ng/mL — ABNORMAL HIGH (ref <=4.0)

## 2020-04-18 LAB — HEMOGLOBIN A1C W/OUT EAG: Hgb A1c MFr Bld: 5.2 % of total Hgb (ref <5.7)

## 2020-04-18 NOTE — Progress Notes (Signed)
Pt advised of results with verbal understanding

## 2020-04-19 NOTE — Telephone Encounter (Signed)
I called his medicine in yesterday.  Have him make appointment soon.

## 2020-04-19 NOTE — Telephone Encounter (Signed)
Patient has not been in office since 01/31/19

## 2020-04-20 ENCOUNTER — Ambulatory Visit (HOSPITAL_COMMUNITY)
Admission: RE | Admit: 2020-04-20 | Discharge: 2020-04-20 | Disposition: A | Discharge status: Home / Self Care | Payer: Medicaid Other | Source: Ambulatory Visit | Attending: Family Medicine | Admitting: Family Medicine

## 2020-04-20 ENCOUNTER — Other Ambulatory Visit: Payer: Self-pay

## 2020-04-20 DIAGNOSIS — I714 Abdominal aortic aneurysm, without rupture, unspecified: Secondary | ICD-10-CM

## 2020-04-21 ENCOUNTER — Other Ambulatory Visit: Payer: Self-pay | Admitting: Family Medicine

## 2020-04-21 DIAGNOSIS — I714 Abdominal aortic aneurysm, without rupture, unspecified: Secondary | ICD-10-CM

## 2020-04-24 ENCOUNTER — Telehealth: Payer: Self-pay

## 2020-04-24 NOTE — Telephone Encounter (Signed)
Returning a call to nurse

## 2020-04-24 NOTE — Telephone Encounter (Signed)
Returned patients call. No answer, left vm 

## 2020-04-25 ENCOUNTER — Other Ambulatory Visit: Payer: Self-pay | Admitting: Family Medicine

## 2020-04-25 ENCOUNTER — Encounter (HOSPITAL_COMMUNITY): Payer: Self-pay | Admitting: *Deleted

## 2020-04-25 ENCOUNTER — Encounter: Payer: Self-pay | Admitting: Acute Care

## 2020-04-25 NOTE — Telephone Encounter (Signed)
Called patient to discuss results. No answer. Will try again

## 2020-04-25 NOTE — Progress Notes (Addendum)
I was able to make contact with patient today via telephone. I obtained his smoking history and he reports that he started around age 69, only smoked a few cigaretts per day. He never really has made it to a half pack daily.  He states that he smokes less than 10 cigaretts daily and has done so since that time.  Based on his smoking history, he only has at most 26.5 pack year history.  This does not qualify him for the LDCT for lung cancer screening.  I advised him that if he ever develops any symptoms concerning for lung cancer to follow up with his PCP.  He verbalizes understanding.  After looking through his chart, he is being followed by [place] NP, Eric Form.  She has been updated as well as Dr. Moshe Cipro.

## 2020-04-26 NOTE — Telephone Encounter (Signed)
Spoke with pt and he is aware of referral to vascular

## 2020-05-09 ENCOUNTER — Other Ambulatory Visit: Payer: Self-pay | Admitting: Family Medicine

## 2020-05-11 ENCOUNTER — Other Ambulatory Visit: Payer: Self-pay | Admitting: Orthopaedic Surgery

## 2020-05-12 ENCOUNTER — Other Ambulatory Visit: Payer: Self-pay | Admitting: Cardiology

## 2020-05-14 MED ORDER — HYDROCODONE-ACETAMINOPHEN 5-325 MG PO TABS: ORAL_TABLET | ORAL | 0 refills | Status: DC

## 2020-05-15 ENCOUNTER — Ambulatory Visit (INDEPENDENT_AMBULATORY_CARE_PROVIDER_SITE_OTHER): Payer: Medicare Other | Admitting: Orthopaedic Surgery

## 2020-05-15 ENCOUNTER — Other Ambulatory Visit: Payer: Self-pay

## 2020-05-15 ENCOUNTER — Encounter: Payer: Self-pay | Admitting: Orthopaedic Surgery

## 2020-05-15 VITALS — BP 144/89 | HR 89 | Ht 74.5 in | Wt 153.1 lb

## 2020-05-15 DIAGNOSIS — F17210 Nicotine dependence, cigarettes, uncomplicated: Secondary | ICD-10-CM | POA: Diagnosis not present

## 2020-05-15 DIAGNOSIS — I70213 Atherosclerosis of native arteries of extremities with intermittent claudication, bilateral legs: Secondary | ICD-10-CM | POA: Diagnosis not present

## 2020-05-15 DIAGNOSIS — M25512 Pain in left shoulder: Secondary | ICD-10-CM

## 2020-05-15 DIAGNOSIS — M5442 Lumbago with sciatica, left side: Secondary | ICD-10-CM

## 2020-05-15 DIAGNOSIS — G8929 Other chronic pain: Secondary | ICD-10-CM

## 2020-05-15 NOTE — Patient Instructions (Signed)

## 2020-05-15 NOTE — Progress Notes (Signed)
[name], male DOB:[birth year], 69 y.o. MAU:[id number]  Chief Complaint  Patient presents with  . Back Pain    hurting  . Knee Pain    Bilateral/hurting    HPI  Dillon [name] is a 69 y.o. male who has chronic lower back pain and knee pain.  He is doing better but has good and bad days.  He has more pain with the rain and cold and extra activity.  He has no numbness. He is taking his medicine.   Body mass index is 19.4 kg/m.  ROS  Review of Systems  Respiratory: Positive for shortness of breath. Negative for cough.   Cardiovascular: Negative for chest pain and leg swelling.  Endocrine: Negative for cold intolerance.  Musculoskeletal: Positive for arthralgias and myalgias.  Allergic/Immunologic: Positive for environmental allergies.  All other systems reviewed and are negative.   All other systems reviewed and are negative.  The following is a summary of the past history medically, past history surgically, known current medicines, social history and family history.  This information is gathered electronically by the computer from prior information and documentation.  I review this each visit and have found including this information at this point in the chart is beneficial and informative.    Past Medical History:  Diagnosis Date  . Arthritis    2012  . Asthma    childhood  . COPD (chronic obstructive pulmonary disease) ([place])   . Hyperlipidemia    1990  . Hypertension    1990  . Nicotine addiction   . PAD (peripheral artery disease) ([place]) 2015    Past Surgical History:  Procedure Laterality Date  . KNEE ARTHROSCOPY     both knees   . LOWER EXTREMITY ANGIOGRAPHY N/A 02/04/2018   Procedure: LOWER EXTREMITY ANGIOGRAPHY;  Surgeon: Conrad Routt, MD;  Location: [facility];  Service: Cardiovascular;  Laterality: N/A;    Family History  Problem Relation Age of Onset  . Heart attack Mother   . Diabetes Mother   . Hypertension Mother   . Stroke  Brother   . Hypertension Sister   . Hypertension Sister   . Hypertension Sister   . Hypertension Brother   . Colon cancer Neg Hx     Social History Social History   Tobacco Use  . Smoking status: Current Some Day Smoker    Years: 48.00    Types: Cigarettes    Start date: 10/16/1969  . Smokeless tobacco: Never Used  Vaping Use  . Vaping Use: Never used  Substance Use Topics  . Alcohol use: Yes    Alcohol/week: 0.0 standard drinks    Comment: occassional , max of 72 oz pwr month  . Drug use: No    Allergies  Allergen Reactions  . Penicillins Hives and Other (See Comments)    Has patient had a PCN reaction causing immediate rash, facial/tongue/throat swelling, SOB or lightheadedness with hypotension: Unknown Has patient had a PCN reaction causing severe rash involving mucus membranes or skin necrosis: No Has patient had a PCN reaction that required hospitalization: Yes Has patient had a PCN reaction occurring within the last 10 years: No If all of the above answers are "NO", then may proceed with Cephalosporin use.     Current Outpatient Medications  Medication Sig Dispense Refill  . amLODipine (NORVASC) 10 MG tablet TAKE 1 TABLET BY MOUTH EVERY DAY 90 tablet 1  . aspirin EC 81 MG tablet Take 81 mg by mouth daily.    Marland Kitchen  atorvastatin (LIPITOR) 80 MG tablet TAKE 1 TABLET BY MOUTH EVERY DAY 90 tablet 3  . BREO ELLIPTA 200-25 MCG/INH AEPB INHALE 1 PUFF BY MOUTH EVERY DAY 60 each 5  . ergocalciferol (VITAMIN D2) 1.25 MG (50000 UT) capsule Take 1 capsule (50,000 Units total) by mouth once a week. One capsule once weekly 12 capsule 2  . gabapentin (NEURONTIN) 300 MG capsule TAKE 1 CAPSULE (300 MG TOTAL) BY MOUTH AT BEDTIME. 30 capsule 2  . HYDROcodone-acetaminophen (NORCO/VICODIN) 5-325 MG tablet One tablet every six hours as needed for pain. 100 tablet 0  . ipratropium (ATROVENT) 0.02 % nebulizer solution Take 2.5 mLs (0.5 mg total) by nebulization every 6 (six) hours as needed  for wheezing or shortness of breath. 75 mL 12  . levalbuterol (XOPENEX) 0.63 MG/3ML nebulizer solution Take 3 mLs (0.63 mg total) by nebulization every 8 (eight) hours as needed for wheezing or shortness of breath. 3 mL 12  . meloxicam (MOBIC) 15 MG tablet TAKE 1 TABLET BY MOUTH ONCE DAILY AS NEEDED FOR UNCONTROLLED ARTHRITIS PAIN 90 tablet 0  . spironolactone (ALDACTONE) 50 MG tablet TAKE 1 TABLET BY MOUTH EVERY DAY 90 tablet 1  . tiZANidine (ZANAFLEX) 4 MG tablet TAKE 1 TABLET (4 MG TOTAL) BY MOUTH EVERY 8 (EIGHT) HOURS AS NEEDED FOR MUSCLE SPASMS 60 tablet 3  . UNABLE TO FIND Nebulizer facemask and tubing x 1  DX J44.9 1 each 0   No current facility-administered medications for this visit.     Physical Exam  Blood pressure (!) 144/89, pulse 89, height 6' 2.5" (1.892 m), weight 153 lb 2 oz (69.5 kg).  Constitutional: overall normal hygiene, normal nutrition, well developed, normal grooming, normal body habitus. Assistive device:none  Musculoskeletal: gait and station Limp none, muscle tone and strength are normal, no tremors or atrophy is present.  .  Neurological: coordination overall normal.  Deep tendon reflex/nerve stretch intact.  Sensation normal.  Cranial nerves II-XII intact.   Skin:   Normal overall no scars, lesions, ulcers or rashes. No psoriasis.  Psychiatric: Alert and oriented x 3.  Recent memory intact, remote memory unclear.  Normal mood and affect. Well groomed.  Good eye contact.  Cardiovascular: overall no swelling, no varicosities, no edema bilaterally, normal temperatures of the legs and arms, no clubbing, cyanosis and good capillary refill.  Lymphatic: palpation is normal.  Spine/Pelvis examination:  Inspection:  Overall, sacoiliac joint benign and hips nontender; without crepitus or defects.   Thoracic spine inspection: Alignment normal without kyphosis present   Lumbar spine inspection:  Alignment  with normal lumbar lordosis, without scoliosis  apparent.   Thoracic spine palpation:  without tenderness of spinal processes   Lumbar spine palpation: without tenderness of lumbar area; without tightness of lumbar muscles    Range of Motion:   Lumbar flexion, forward flexion is normal without pain or tenderness    Lumbar extension is full without pain or tenderness   Left lateral bend is normal without pain or tenderness   Right lateral bend is normal without pain or tenderness   Straight leg raising is normal  Strength & tone: normal   Stability overall normal stability  All other systems reviewed and are negative   The patient has been educated about the nature of the problem(s) and counseled on treatment options.  The patient appeared to understand what I have discussed and is in agreement with it.  Encounter Diagnoses  Name Primary?  . Chronic left shoulder pain Yes  .  Chronic midline low back pain with left-sided sciatica   . Cigarette nicotine dependence without complication     PLAN Call if any problems.  Precautions discussed.  Continue current medications.   Return to clinic 3 months   Electronically Signed Sanjuana Kava, MD 7/20/20212:20 PM

## 2020-05-17 ENCOUNTER — Ambulatory Visit (INDEPENDENT_AMBULATORY_CARE_PROVIDER_SITE_OTHER): Payer: Medicare Other | Admitting: Urology

## 2020-05-17 ENCOUNTER — Other Ambulatory Visit: Payer: Self-pay

## 2020-05-17 ENCOUNTER — Encounter: Payer: Self-pay | Admitting: Urology

## 2020-05-17 VITALS — BP 125/72 | HR 83 | Temp 98.1°F | Ht 74.5 in | Wt 153.5 lb

## 2020-05-17 DIAGNOSIS — R972 Elevated prostate specific antigen [PSA]: Secondary | ICD-10-CM

## 2020-05-17 DIAGNOSIS — I70213 Atherosclerosis of native arteries of extremities with intermittent claudication, bilateral legs: Secondary | ICD-10-CM

## 2020-05-17 LAB — POCT URINALYSIS DIPSTICK
Glucose, UA: NEGATIVE
Nitrite, UA: NEGATIVE
Protein, UA: NEGATIVE
Spec Grav, UA: 1.02 (ref 1.010–1.025)
Urobilinogen, UA: NEGATIVE E.U./dL — AB
pH, UA: 5 (ref 5.0–8.0)

## 2020-05-17 NOTE — Progress Notes (Signed)
05/17/2020 8:59 AM   Dillon [name] Jun 12, 1951 [id number]  Referring provider: Fayrene Helper, MD [address], [address] [place],  [place] [address]  Elevated PSA  HPI: Dillon [name] is a 69yo here for evaluation of elevated PSA. He was previously evaluated by Dr. Dorina [name] for elevated PSA and underwent prostate biopsy in 2017. PSa at that time was 10. PSA 1 year ago was 7.1 and this year is 6.8. no family hx of prostate cancer No significant LUTS. NO UTI  His records from [facility] are as follows: I have an elevated PSA (S/P TRUSP). HPI: Dillon [name] is a 69 year-old male established patient who is here for an elevated PSA TRUSP.  His prostate biopsy was found to have inflammation and benign. Patient denies prostate cancer, high grade pin, and bph.   This man returns for follow-up. He underwent ultrasound and biopsy of his prostate on 5.23.2017. Prior to his biopsy, his PSA was 10.39. Prostatic volume was 78 cc. PSA density 0.13. All 12 cores revealed were 9 prostatic hyperplasia. 2 cores revealed focal chronic inflammation.   10.1.2019: Most recent PSA 7.1 ( 8.14.2019). No recent urinary issues. No recent UTI or blood in urine.      PMH: Past Medical History:  Diagnosis Date  . Arthritis    2012  . Asthma    childhood  . COPD (chronic obstructive pulmonary disease) ([place])   . Hyperlipidemia    1990  . Hypertension    1990  . Nicotine addiction   . PAD (peripheral artery disease) ([place]) 2015    Surgical History: Past Surgical History:  Procedure Laterality Date  . KNEE ARTHROSCOPY     both knees   . LOWER EXTREMITY ANGIOGRAPHY N/A 02/04/2018   Procedure: LOWER EXTREMITY ANGIOGRAPHY;  Surgeon: Conrad Millsap, MD;  Location: [facility];  Service: Cardiovascular;  Laterality: N/A;    Home Medications:  Allergies as of 05/17/2020      Reactions   Penicillins Hives, Other (See Comments)   Has patient had a PCN reaction causing immediate rash, facial/tongue/throat  swelling, SOB or lightheadedness with hypotension: Unknown Has patient had a PCN reaction causing severe rash involving mucus membranes or skin necrosis: No Has patient had a PCN reaction that required hospitalization: Yes Has patient had a PCN reaction occurring within the last 10 years: No If all of the above answers are "NO", then may proceed with Cephalosporin use.      Medication List       Accurate as of May 17, 2020  8:59 AM. If you have any questions, ask your nurse or doctor.        amLODipine 10 MG tablet Commonly known as: NORVASC TAKE 1 TABLET BY MOUTH EVERY DAY   aspirin EC 81 MG tablet Take 81 mg by mouth daily.   atorvastatin 80 MG tablet Commonly known as: LIPITOR TAKE 1 TABLET BY MOUTH EVERY DAY   Breo Ellipta 200-25 MCG/INH Aepb Generic drug: fluticasone furoate-vilanterol INHALE 1 PUFF BY MOUTH EVERY DAY   ergocalciferol 1.25 MG (50000 UT) capsule Commonly known as: VITAMIN D2 Take 1 capsule (50,000 Units total) by mouth once a week. One capsule once weekly   gabapentin 300 MG capsule Commonly known as: NEURONTIN TAKE 1 CAPSULE (300 MG TOTAL) BY MOUTH AT BEDTIME.   HYDROcodone-acetaminophen 5-325 MG tablet Commonly known as: NORCO/VICODIN One tablet every six hours as needed for pain.   ipratropium 0.02 % nebulizer solution Commonly known as: ATROVENT  Take 2.5 mLs (0.5 mg total) by nebulization every 6 (six) hours as needed for wheezing or shortness of breath.   levalbuterol 0.63 MG/3ML nebulizer solution Commonly known as: XOPENEX Take 3 mLs (0.63 mg total) by nebulization every 8 (eight) hours as needed for wheezing or shortness of breath.   meloxicam 15 MG tablet Commonly known as: MOBIC TAKE 1 TABLET BY MOUTH ONCE DAILY AS NEEDED FOR UNCONTROLLED ARTHRITIS PAIN   spironolactone 50 MG tablet Commonly known as: ALDACTONE TAKE 1 TABLET BY MOUTH EVERY DAY   tiZANidine 4 MG tablet Commonly known as: ZANAFLEX TAKE 1 TABLET (4 MG TOTAL) BY  MOUTH EVERY 8 (EIGHT) HOURS AS NEEDED FOR MUSCLE SPASMS   UNABLE TO FIND Nebulizer facemask and tubing x 1  DX J44.9       Allergies:  Allergies  Allergen Reactions  . Penicillins Hives and Other (See Comments)    Has patient had a PCN reaction causing immediate rash, facial/tongue/throat swelling, SOB or lightheadedness with hypotension: Unknown Has patient had a PCN reaction causing severe rash involving mucus membranes or skin necrosis: No Has patient had a PCN reaction that required hospitalization: Yes Has patient had a PCN reaction occurring within the last 10 years: No If all of the above answers are "NO", then may proceed with Cephalosporin use.     Family History: Family History  Problem Relation Age of Onset  . Heart attack Mother   . Diabetes Mother   . Hypertension Mother   . Stroke Brother   . Hypertension Sister   . Hypertension Sister   . Hypertension Sister   . Hypertension Brother   . Colon cancer Neg Hx     Social History:  reports that he has been smoking cigarettes. He started smoking about 50 years ago. He has smoked for the past 48.00 years. He has never used smokeless tobacco. He reports current alcohol use. He reports that he does not use drugs.  ROS: All other review of systems were reviewed and are negative except what is noted above in HPI  Physical Exam: BP 125/72   Pulse 83   Temp 98.1 F (36.7 C)   Ht 6' 2.5" (1.892 m)   Wt 153 lb 8 oz (69.6 kg)   BMI 19.44 kg/m   Constitutional:  Alert and oriented, No acute distress. HEENT: [place] AT, moist mucus membranes.  Trachea midline, no masses. Cardiovascular: No clubbing, cyanosis, or edema. Respiratory: Normal respiratory effort, no increased work of breathing. GI: Abdomen is soft, nontender, nondistended, no abdominal masses GU: No CVA tenderness. Circumcised phallus. No masses/lesions on penis, testis, scrotum. Prostate 80g smooth no nodules no induration.  Lymph: No cervical or inguinal  lymphadenopathy. Skin: No rashes, bruises or suspicious lesions. Neurologic: Grossly intact, no focal deficits, moving all 4 extremities. Psychiatric: Normal mood and affect.  Laboratory Data: Lab Results  Component Value Date   WBC 5.9 04/16/2020   HGB 13.8 04/16/2020   HCT 40.1 04/16/2020   MCV 92.4 04/16/2020   PLT 431 (H) 04/16/2020    Lab Results  Component Value Date   CREATININE 1.05 04/16/2020    Lab Results  Component Value Date   PSA 6.8 (H) 04/16/2020   PSA 7.1 (H) 06/09/2018   PSA 10.39 (H) 01/24/2016    No results found for: TESTOSTERONE  Lab Results  Component Value Date   HGBA1C 5.2 04/16/2020    Urinalysis    Component Value Date/Time   COLORURINE YELLOW 05/03/2008 0116   APPEARANCEUR  CLEAR 05/03/2008 0116   LABSPEC 1.023 05/03/2008 0116   PHURINE 6.5 05/03/2008 0116   GLUCOSEU NEG mg/dL 05/03/2008 0116   BILIRUBINUR neg 06/09/2018 1009   KETONESUR NEG mg/dL 05/03/2008 0116   PROTEINUR Negative 06/09/2018 1009   PROTEINUR NEG mg/dL 05/03/2008 0116   UROBILINOGEN 1.0 06/09/2018 1009   UROBILINOGEN 1 05/03/2008 0116   NITRITE neg 06/09/2018 1009   NITRITE NEG 05/03/2008 0116   LEUKOCYTESUR Negative 06/09/2018 1009    No results found for: LABMICR, [place], RBCUA, LABEPIT, MUCUS, BACTERIA  Pertinent Imaging:  No results found for this or any previous visit.  No results found for this or any previous visit.  No results found for this or any previous visit.  No results found for this or any previous visit.  Results for orders placed during the hospital encounter of 02/26/15  US Renal  Narrative CLINICAL DATA:  Bilateral flank pain, hypertension  EXAM: RENAL / URINARY TRACT ULTRASOUND COMPLETE  COMPARISON:  PET-CT of 02/16/2014  FINDINGS: Right Kidney:  Length: 12.7 cm. There is minimal fullness of the right renal pelvis. No definite hydronephrosis is seen.  Left Kidney:  Length: 12.3 cm. There is also mild fullness of the  renal pelvis. No definite hydronephrosis is noted.  Bladder:  The urinary bladder is unremarkable. Bilateral ureteral jets are noted. The prostate is prominent measuring 6.4 x 5.6 x 5.0 cm with a volume of 92 cubic cm.  IMPRESSION: 1. Minimal fullness of both renal pelves. 2. Prominent prostate.   Electronically Signed By: Ivar Drape M.D. On: 02/26/2015 11:56  No results found for this or any previous visit.  No results found for this or any previous visit.  No results found for this or any previous visit.   Assessment & Plan:    1. Elevated PSA -We discussed the natural hx of elevated PSA and the various causes. Since his PSA is lower this year and it is 3 points lower than when he had his prostate biopsy we have elected to pursue PSA surveillance. RTC 6 months with PSA  - POCT urinalysis dipstick   No follow-ups on file.  Nicolette Bang, MD  [facility] [place]

## 2020-05-17 NOTE — Patient Instructions (Signed)
Prostate-Specific Antigen Test Why am I having this test? The prostate-specific antigen (PSA) test is a screening test for prostate cancer. It can identify early signs of prostate cancer, which may allow for more effective treatment. Your health care provider may recommend that you have a PSA test starting at age 68 or that you have one earlier or later, depending on your risk factors for prostate cancer. You may also have a PSA test:  To monitor treatment of prostate cancer.  To check whether prostate cancer has returned after treatment.  If you have signs of other conditions that can affect PSA levels, such as: ? An enlarged prostate that is not caused by cancer (benign prostatic hyperplasia, BPH). This condition is very common in older men. ? A prostate infection. What is being tested? This test measures the amount of PSA in your blood. PSA is a protein that is made in the prostate. The prostate naturally produces more PSA as you age, but very high levels may be a sign of a medical condition. What kind of sample is taken?  A blood sample is required for this test. It is usually collected by inserting a needle into a blood vessel or by sticking a finger with a small needle. Blood for this test should be drawn before having an exam of the prostate. How do I prepare for this test? Do not ejaculate starting 24 hours before your test, or as long as told by your health care provider. Tell a health care provider about:  Any allergies you have.  All medicines you are taking, including vitamins, herbs, eye drops, creams, and over-the-counter medicines. This also includes: ? Medicines to assist with hair growth, such as finasteride. ? Any recent exposure to a medicine called diethylstilbestrol.  Any blood disorders you have.  Any recent procedures you have had, especially any procedures involving the prostate or rectum.  Any medical conditions you have.  Any recent urinary tract infections  (UTIs) you have had. How are the results reported? Your test results will be reported as a value that indicates how much PSA is in your blood. This will be given as nanograms of PSA per milliliter of blood (ng/mL). Your health care provider will compare your results to normal ranges that were established after testing a large group of people (reference ranges). Reference ranges may vary among labs and hospitals. PSA levels vary from person to person and generally increase with age. Because of this variation, there is no single PSA value that is considered normal for everyone. Instead, PSA reference ranges are used to describe whether your PSA levels are considered low or high (elevated). Common reference ranges are:  Low: 0-2.5 ng/mL.  Slightly to moderately elevated: 2.6-10.0 ng/mL.  Moderately elevated: 10.0-19.9 ng/mL.  Significantly elevated: 20 ng/mL or greater. Sometimes, the test results may report that a condition is present when it is not present (false-positive result). What do the results mean? A test result that is higher than 4 ng/mL may mean that you are at an increased risk for prostate cancer. However, a PSA test by itself is not enough to diagnose prostate cancer. High PSA levels may also be caused by the natural aging process, prostate infection, or BPH. PSA screening cannot tell you if your PSA is high due to cancer or a different cause. A prostate biopsy is the only way to diagnose prostate cancer. A risk of having the PSA test is diagnosing and treating prostate cancer that would never have caused any   symptoms or problems (overdiagnosis and overtreatment). Talk with your health care provider about what your results mean. Questions to ask your health care provider Ask your health care provider, or the department that is doing the test:  When will my results be ready?  How will I get my results?  What are my treatment options?  What other tests do I need?  What are my  next steps? Summary  The prostate-specific antigen (PSA) test is a screening test for prostate cancer.  Your health care provider may recommend that you have a PSA test starting at age 68 or that you have one earlier or later, depending on your risk factors for prostate cancer.  A test result that is higher than 4 ng/mL may mean that you are at an increased risk for prostate cancer. However, elevated levels can be caused by a number of conditions other than prostate cancer.  Talk with your health care provider about what your results mean. This information is not intended to replace advice given to you by your health care provider. Make sure you discuss any questions you have with your health care provider. Document Revised: 09/25/2017 Document Reviewed: 07/20/2017 Elsevier Patient Education  2020 Elsevier Inc.  

## 2020-05-17 NOTE — Progress Notes (Signed)
Urological Symptom Review  Patient is experiencing the following symptoms: None   Review of Systems  Gastrointestinal (upper)  : Negative for upper GI symptoms  Gastrointestinal (lower) : Negative for lower GI symptoms  Constitutional : Weight loss  Skin: Negative for skin symptoms  Eyes: Blurred vision  Ear/Nose/Throat : Negative for Ear/Nose/Throat symptoms  Hematologic/Lymphatic: Negative for Hematologic/Lymphatic symptoms  Cardiovascular : Negative for cardiovascular symptoms  Respiratory : Negative for respiratory symptoms  Endocrine: Negative for endocrine symptoms  Musculoskeletal: Back pain  Neurological: Negative for neurological symptoms  Psychologic: Negative for psychiatric symptoms

## 2020-06-03 IMAGING — DX DG CHEST 2V
2 series · 2 of 2 positions shown · non-contrast
Comparison: CT 05/31/2018.

CLINICAL DATA: Shortness of breath.  Productive cough.

EXAM:
CHEST - 2 VIEW

[chest pa]
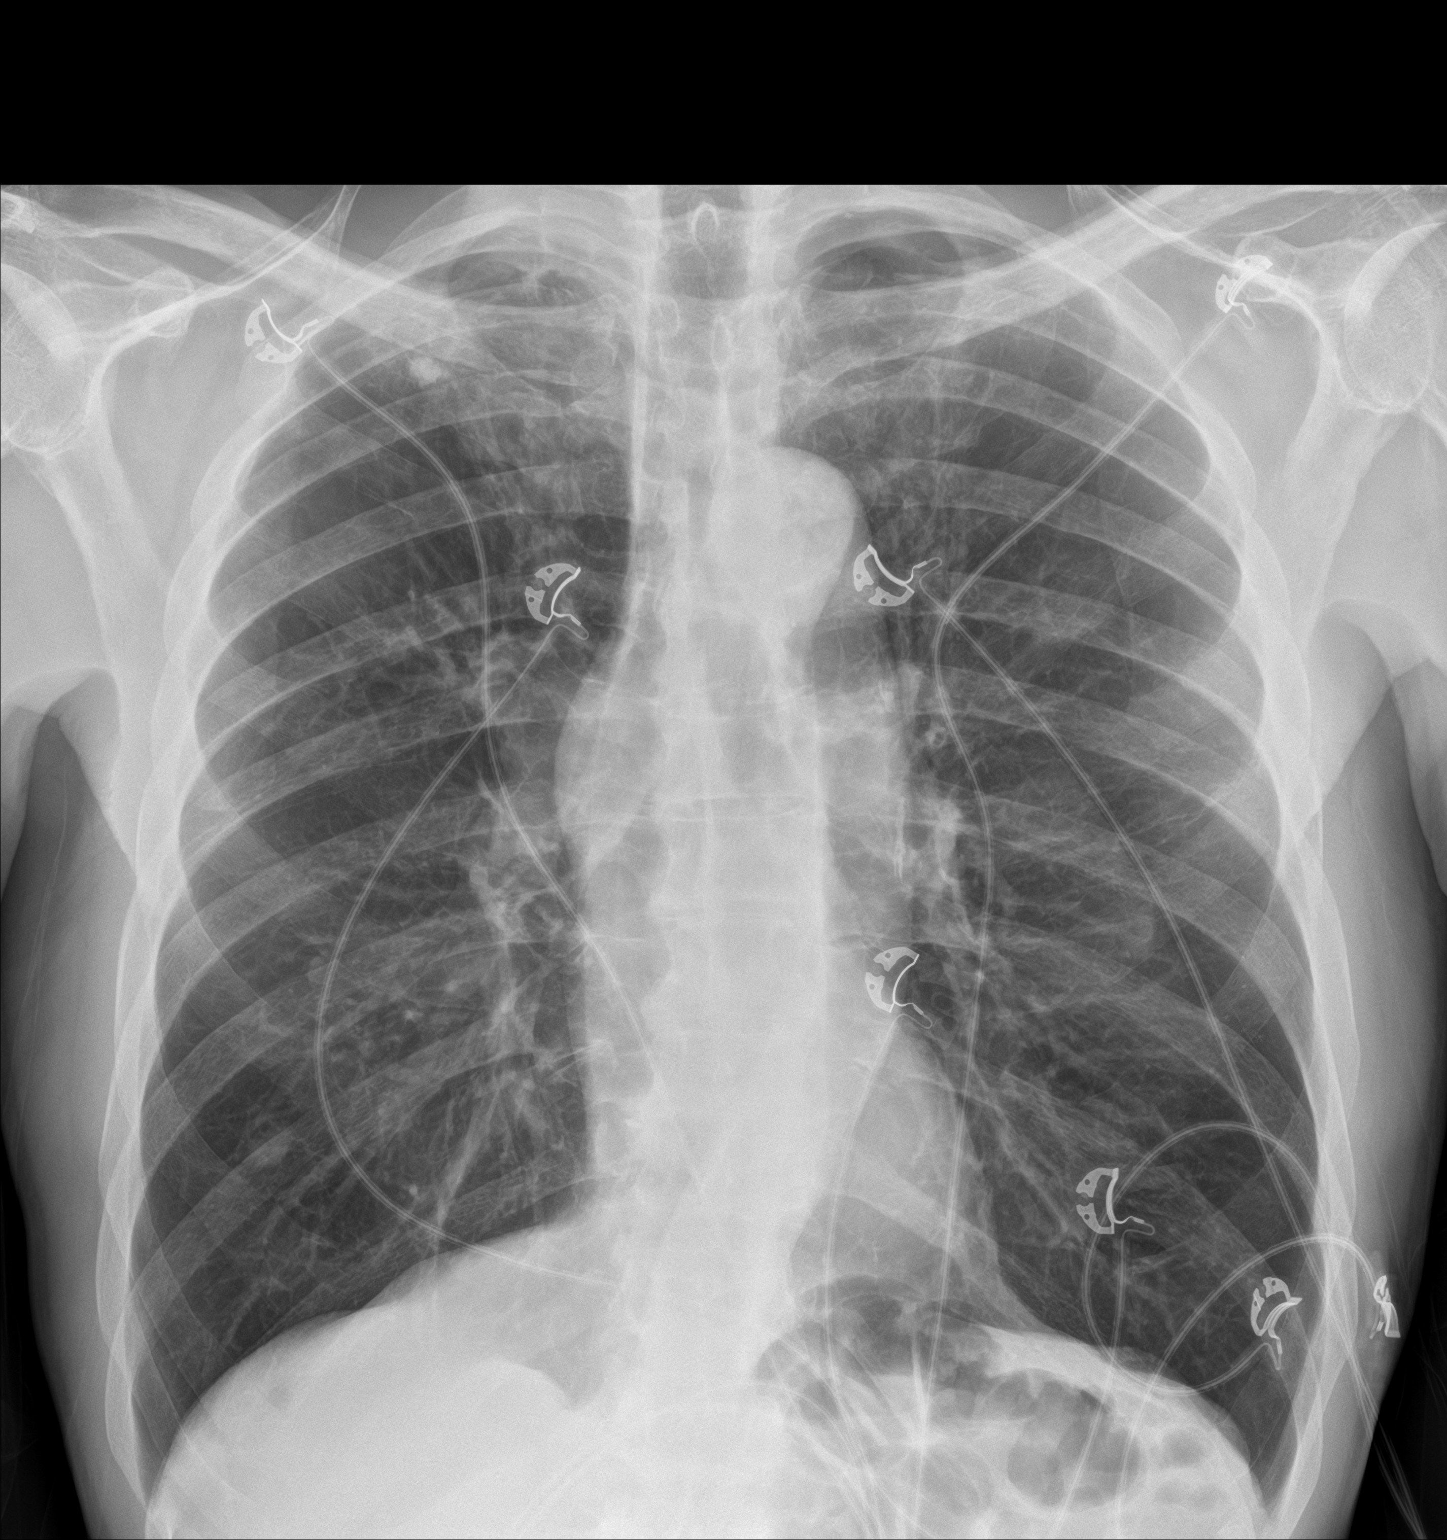

[chest lat]
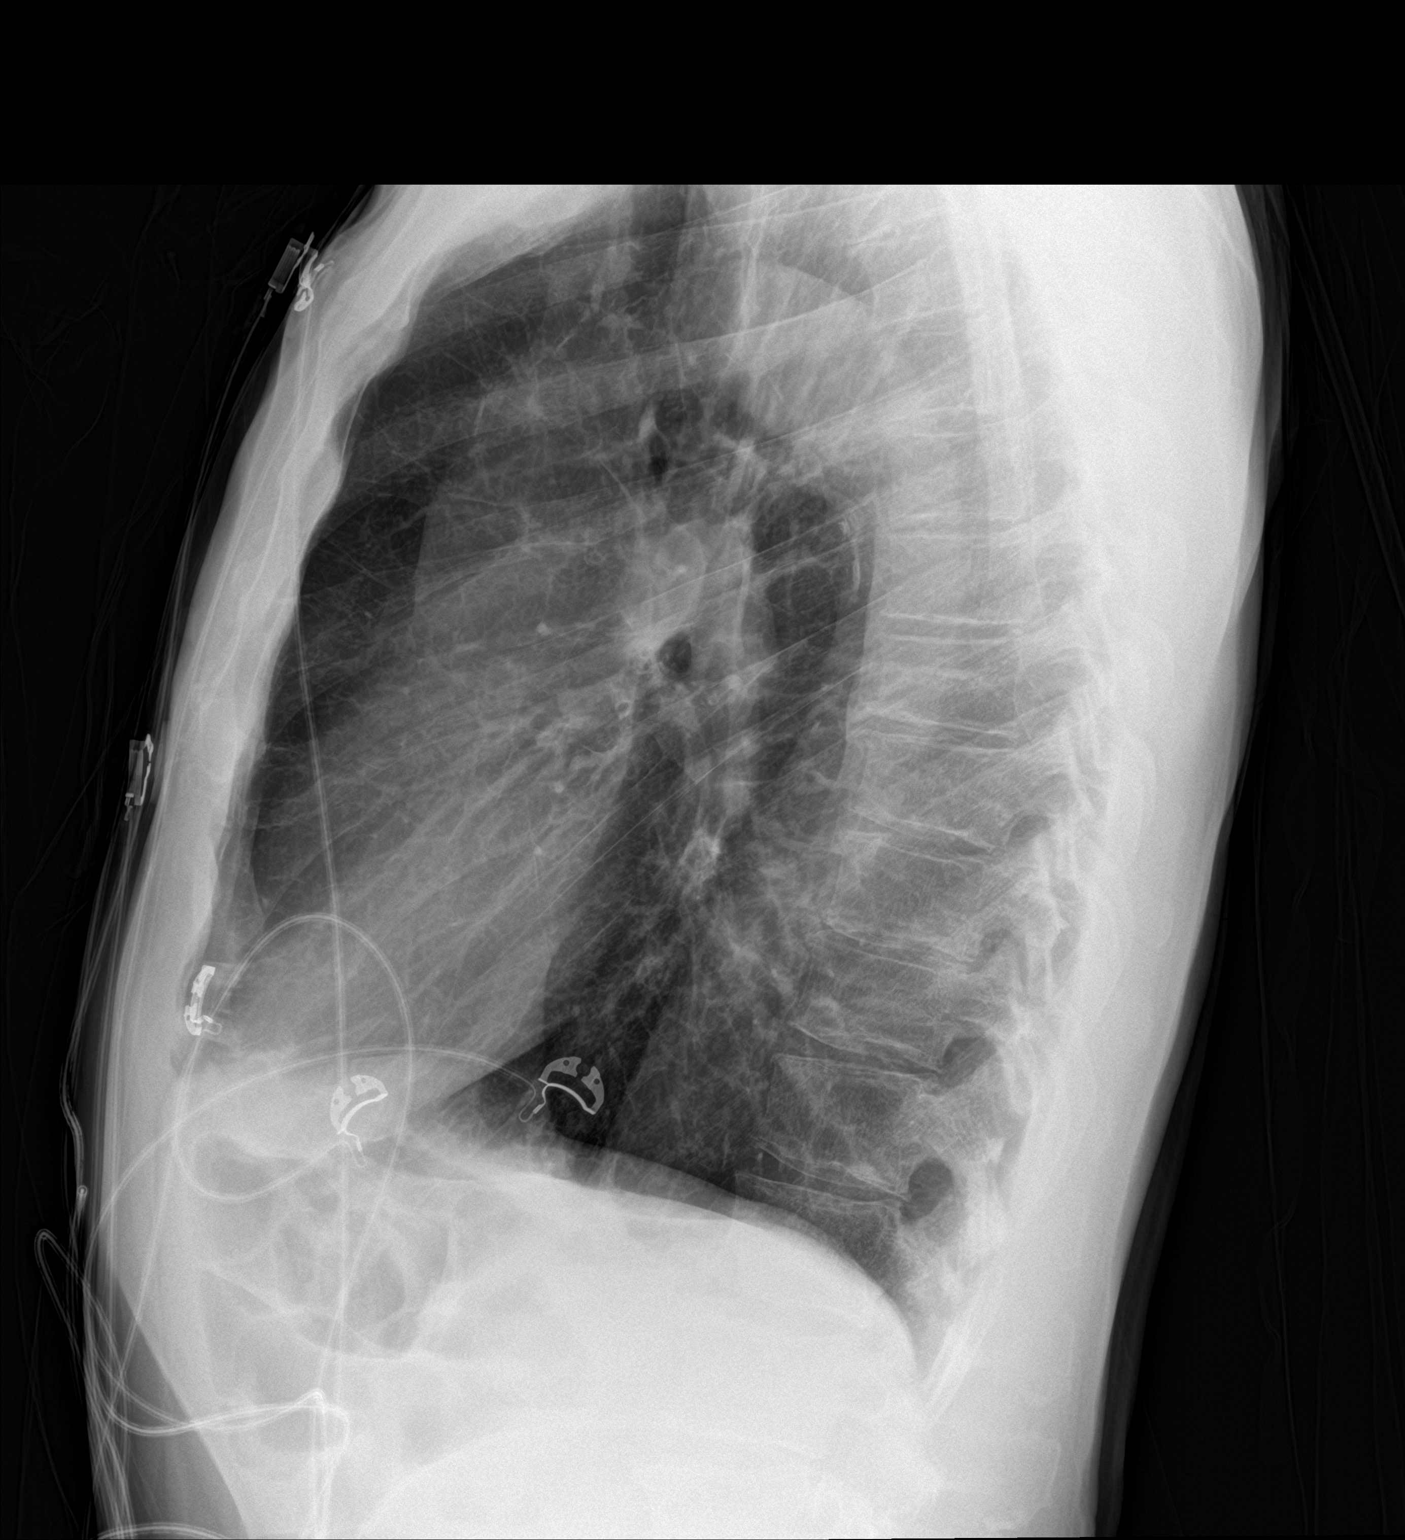

[2 of 2 positions shown; findings below may reference images not displayed]

FINDINGS: Mediastinum and hilar structures normal. Heart size normal. Stable
nodular opacities are noted within the right lung. Bilateral nipple
shadows noted. Changes COPD with pleuroparenchymal scarring noted.
No acute pulmonary abnormality identified. Heart size stable. No
acute bony abnormality
IMPRESSION: 1. Stable nodular densities in the right lung. COPD. Chest is stable
from prior exams.

2.  No acute pulmonary abnormality.

## 2020-06-06 ENCOUNTER — Other Ambulatory Visit: Payer: Self-pay | Admitting: Orthopaedic Surgery

## 2020-06-07 MED ORDER — HYDROCODONE-ACETAMINOPHEN 5-325 MG PO TABS: ORAL_TABLET | ORAL | 0 refills | Status: DC

## 2020-06-08 ENCOUNTER — Telehealth: Payer: Self-pay | Admitting: *Deleted

## 2020-06-08 NOTE — Telephone Encounter (Signed)
Chanda Maness-Harrison sent message wanting to know if pt still needed to follow up as he had an appointment with [facility] in 10-2018 and they told him to follow up 10-2020. Do you think pt needs to be seen any sooner than this or can we close referral?

## 2020-06-10 NOTE — Telephone Encounter (Signed)
No he does not, I sent a direct response

## 2020-06-13 ENCOUNTER — Telehealth: Payer: Self-pay

## 2020-06-13 NOTE — Telephone Encounter (Signed)
Pt wanting an Rx for ensure

## 2020-06-13 NOTE — Telephone Encounter (Signed)
No dx of malnutrition so no medical justification, I checked record

## 2020-06-13 NOTE — Telephone Encounter (Signed)
Do you want me to send rx? I don't believe this is covered by medicare

## 2020-06-14 ENCOUNTER — Ambulatory Visit (HOSPITAL_COMMUNITY)
Admission: RE | Admit: 2020-06-14 | Discharge: 2020-06-14 | Disposition: A | Discharge status: Home / Self Care | Payer: Medicare Other | Source: Ambulatory Visit | Attending: Acute Care | Admitting: Acute Care

## 2020-06-14 ENCOUNTER — Other Ambulatory Visit: Payer: Self-pay

## 2020-06-14 DIAGNOSIS — F17200 Nicotine dependence, unspecified, uncomplicated: Secondary | ICD-10-CM | POA: Diagnosis present

## 2020-06-14 DIAGNOSIS — F17210 Nicotine dependence, cigarettes, uncomplicated: Secondary | ICD-10-CM

## 2020-06-14 DIAGNOSIS — Z122 Encounter for screening for malignant neoplasm of respiratory organs: Secondary | ICD-10-CM | POA: Insufficient documentation

## 2020-06-14 NOTE — Progress Notes (Signed)
I have attempted to call Mr. Eber with the results of his low-dose CT.  There was no answer.  Additionally there was no option to leave a message on his voicemail. Langley Gauss, I will need to order PFTs and a PET scan on this patient once we get in touch with him.  We will need to continue to try and get in touch with him.  I will try him again tomorrow or later this afternoon.  Please fax PCP the results of the scan, and let them know that we will follow-up with the patient to get imaging and pulmonary function tests ordered.  Thanks so much

## 2020-06-14 NOTE — Telephone Encounter (Signed)
Called pt- no answer.  If he calls, let pt know no rx can be sent for ensure because he doesn't have any diagnosis to support it. But he can still purchase it himself if he wants

## 2020-06-15 NOTE — Progress Notes (Signed)
I have called again to give the patient the results of his scan. There is no answer on either his home or cell phone . There was no option to leave a message.  We will continue to try to contact.  Langley Gauss / Rodena Piety do you have another number we can call? We may need to send a certified letter if we cannot reach this patient.  Thanks

## 2020-06-21 ENCOUNTER — Encounter: Payer: Self-pay | Admitting: *Deleted

## 2020-06-21 ENCOUNTER — Telehealth: Payer: Self-pay | Admitting: Acute Care

## 2020-06-21 NOTE — Telephone Encounter (Signed)
  Triage Please send a certified letter to Mr. Danielsen stating that we need to get in touch with him regarding his most recent CT scan . Please ask him to get in touch with the office upon receipt of the letter.  Let him know we have been trying to get in touch with him by phone, and we have had no answer, and no option to leave a message on either home or cell number.   Please let me know if you need any additional information. Thanks so much

## 2020-06-21 NOTE — Progress Notes (Signed)
I have attempted to call the patient again today at both cell and home phone. There was no answer. There was no option to leave a message on either line.  Dillon [name] and [place]. We need to send a certified letter to let the patient know we need him to call the office for the results of his scan. Thanks so much

## 2020-06-21 NOTE — Telephone Encounter (Signed)
Letter has been written and sent to pt's mychart account and has also been placed in the mail to pt at the address we have on file. Nothing further needed.

## 2020-06-28 ENCOUNTER — Telehealth: Payer: Self-pay | Admitting: Acute Care

## 2020-06-28 NOTE — Telephone Encounter (Signed)
Sarah please advise- pt is returning call for LDCT results.  Thanks!

## 2020-06-29 NOTE — Telephone Encounter (Signed)
I have attempted to call the patient back. There was no answer.  There is no option to leave a message for a return call. We have had a very difficult time getting in touch with this patient. We had to send a certified letter , which he finally responded to with a phone call. Unfortunately, I was not in the office when he called back.  Please get patient scheduled for an OV or a telephone visit with me  ( he would have to know that he has to be answering his phone if he chooses a tele visit) so that I can go over these results with him. He needs follow up based on the scan results.  Thanks so much

## 2020-06-29 NOTE — Telephone Encounter (Signed)
lmtcb for pt.  Need to schedule OV when pt returns call please.

## 2020-07-09 ENCOUNTER — Other Ambulatory Visit: Payer: Self-pay

## 2020-07-09 ENCOUNTER — Ambulatory Visit (INDEPENDENT_AMBULATORY_CARE_PROVIDER_SITE_OTHER): Payer: Medicare Other | Admitting: Acute Care

## 2020-07-09 ENCOUNTER — Encounter: Payer: Self-pay | Admitting: Acute Care

## 2020-07-09 VITALS — BP 142/76 | HR 92 | Temp 97.8°F | Ht 74.5 in | Wt 152.4 lb

## 2020-07-09 DIAGNOSIS — J449 Chronic obstructive pulmonary disease, unspecified: Secondary | ICD-10-CM | POA: Diagnosis not present

## 2020-07-09 DIAGNOSIS — I70213 Atherosclerosis of native arteries of extremities with intermittent claudication, bilateral legs: Secondary | ICD-10-CM

## 2020-07-09 DIAGNOSIS — J45909 Unspecified asthma, uncomplicated: Secondary | ICD-10-CM | POA: Diagnosis not present

## 2020-07-09 DIAGNOSIS — F17210 Nicotine dependence, cigarettes, uncomplicated: Secondary | ICD-10-CM | POA: Diagnosis not present

## 2020-07-09 DIAGNOSIS — R911 Solitary pulmonary nodule: Secondary | ICD-10-CM | POA: Diagnosis not present

## 2020-07-09 NOTE — Progress Notes (Signed)
History of Present Illness Dillon [name] is a 69 y.o. male current smoker with COPD and asthma, HTN, PVD,AAA without rupture, and elevated PSA, followed by the lung screening scan.    07/09/2020  Pt. Presents for follow up of new nodule with  interval development of a focal, nodular area of masslike architectural distortion with spiculated peripheral margins within the right upper lobe since his LDCT 05/2019. This has an equivalent diameter of 15.6 mm.Pt. states he has also had some unexplained weight loss. He denies any hemoptysis. We have had a very hard time getting in touch with Dillon [name] by phone. He mows lawns for a living, and often times he does not hear his phone ring. Likewise he has been slow to return our calls. . We had to send a certified letter to his home to ensure he was aware we needed to speak with him about about his scan.   He presents today, he states he continues to smoke about 8 cigarettes daily, and that he is compliant with his Breo. He has no breathing issues at present. States his breathing is good.   We have ordered a PET scan and PFT's , and we have asked him to be very attentive to his cell phone ( the number he has asked [place] to call) for any calls from [place] to schedule these follow up scans and to get results. We did discuss the possibility of biopsy based on the results of the above scans.   Last PFT's 2014   Test Results:  06/14/2020 LDCT  Lungs/Pleura: Advanced changes of centrilobular and paraseptal emphysema with bullous changes noted. No pleural effusion, airspace consolidation or atelectasis. Previously noted calcified and noncalcified lung nodules appear unchanged from previous exam. Since the previous study there has been interval development of a focal, nodular area of masslike architectural distortion with spiculated peripheral margins within the right upper lobe. This has an equivalent diameter of 15.6 mm.  Lung-RADS 4B, suspicious. Additional  imaging evaluation or consultation with Pulmonology or Thoracic Surgery recommended. Emphysema and aortic atherosclerosis.Coronary artery calcifications. CBC Latest Ref Rng & Units 04/16/2020 06/23/2019 11/07/2018  WBC 3.8 - 10.8 Thousand/uL 5.9 6.0 7.9  Hemoglobin 13.2 - 17.1 g/dL 13.8 13.1(L) 13.4  Hematocrit 38 - 50 % 40.1 38.3(L) 41.1  Platelets 140 - 400 Thousand/uL 431(H) 362 356    BMP Latest Ref Rng & Units 04/16/2020 11/07/2018 10/14/2018  Glucose 65 - 99 mg/dL 106(H) 99 98  BUN 7 - 25 mg/dL 17 16 11   Creatinine 0.70 - 1.25 mg/dL 1.05 0.89 0.93  BUN/Creat Ratio 6 - 22 (calc) NOT APPLICABLE - -  Sodium 546 - 146 mmol/L 137 137 138  Potassium 3.5 - 5.3 mmol/L 4.7 3.6 3.7  Chloride 98 - 110 mmol/L 98 103 99  CO2 20 - 32 mmol/L 28 26 30   Calcium 8.6 - 10.3 mg/dL 10.2 8.9 9.1    BNP    Component Value Date/Time   BNP 23.0 11/07/2018 0920    ProBNP    Component Value Date/Time   PROBNP <30.0 06/24/2010 0600    PFT No results found for: FEV1PRE, FEV1POST, FVCPRE, FVCPOST, TLC, DLCOUNC, PREFEV1FVCRT, PSTFEV1FVCRT  CT CHEST LUNG CA SCREEN LOW DOSE W/O CM  Result Date: 06/14/2020 CLINICAL DATA:  Lung cancer screening. Fifty-three pack-year history. Current asymptomatic smoker. EXAM: CT CHEST WITHOUT CONTRAST LOW-DOSE FOR LUNG CANCER SCREENING TECHNIQUE: Multidetector CT imaging of the chest was performed following the standard protocol without IV contrast. COMPARISON:  06/02/2019  FINDINGS: Cardiovascular: Heart size appears within normal limits. Aortic atherosclerosis identified. Coronary artery calcifications. No pericardial effusion. Mediastinum/Nodes: No enlarged mediastinal, hilar, or axillary lymph nodes. Thyroid gland, trachea, and esophagus demonstrate no significant findings. Lungs/Pleura: Advanced changes of centrilobular and paraseptal emphysema with bullous changes noted. No pleural effusion, airspace consolidation or atelectasis. Previously noted calcified and  noncalcified lung nodules appear unchanged from previous exam. Since the previous study there has been interval development of a focal, nodular area of masslike architectural distortion with spiculated peripheral margins within the right upper lobe. This has an equivalent diameter of 15.6 mm. Upper Abdomen: Calcified granuloma noted within the liver. Aortic atherosclerosis. No acute abnormalities noted within the imaged portions of the upper abdomen. Musculoskeletal: Mild degenerative disc disease. No acute or suspicious osseous findings. IMPRESSION: 1. Lung-RADS 4B, suspicious. Additional imaging evaluation or consultation with Pulmonology or Thoracic Surgery recommended. 2. Emphysema and aortic atherosclerosis. 3. Coronary artery calcifications. Aortic Atherosclerosis (ICD10-I70.0) and Emphysema (ICD10-J43.9). Electronically Signed   By: Kerby Moors M.D.   On: 06/14/2020 13:04     Past medical hx Past Medical History:  Diagnosis Date  . Arthritis    2012  . Asthma    childhood  . COPD (chronic obstructive pulmonary disease) ([place])   . Hyperlipidemia    1990  . Hypertension    1990  . Nicotine addiction   . PAD (peripheral artery disease) ([place]) 2015     Social History   Tobacco Use  . Smoking status: Current Some Day Smoker    Years: 48.00    Types: Cigarettes    Start date: 10/16/1969  . Smokeless tobacco: Never Used  Vaping Use  . Vaping Use: Never used  Substance Use Topics  . Alcohol use: Yes    Alcohol/week: 0.0 standard drinks    Comment: occassional , max of 72 oz pwr month  . Drug use: No    DillonNurse reports that he has been smoking cigarettes. He started smoking about 50 years ago. He has smoked for the past 48.00 years. He has never used smokeless tobacco. He reports current alcohol use. He reports that he does not use drugs.  Tobacco Cessation: Current every day smoker with a 48 pack year smoking history  Past surgical hx, Family hx, Social hx all  reviewed.  Current Outpatient Medications on File Prior to Visit  Medication Sig  . amLODipine (NORVASC) 10 MG tablet TAKE 1 TABLET BY MOUTH EVERY DAY  . aspirin EC 81 MG tablet Take 81 mg by mouth daily.  Marland Kitchen atorvastatin (LIPITOR) 80 MG tablet TAKE 1 TABLET BY MOUTH EVERY DAY  . BREO ELLIPTA 200-25 MCG/INH AEPB INHALE 1 PUFF BY MOUTH EVERY DAY  . ergocalciferol (VITAMIN D2) 1.25 MG (50000 UT) capsule Take 1 capsule (50,000 Units total) by mouth once a week. One capsule once weekly  . gabapentin (NEURONTIN) 300 MG capsule TAKE 1 CAPSULE (300 MG TOTAL) BY MOUTH AT BEDTIME.  Marland Kitchen HYDROcodone-acetaminophen (NORCO/VICODIN) 5-325 MG tablet One tablet every six hours as needed for pain.  Marland Kitchen ipratropium (ATROVENT) 0.02 % nebulizer solution Take 2.5 mLs (0.5 mg total) by nebulization every 6 (six) hours as needed for wheezing or shortness of breath.  . levalbuterol (XOPENEX) 0.63 MG/3ML nebulizer solution Take 3 mLs (0.63 mg total) by nebulization every 8 (eight) hours as needed for wheezing or shortness of breath.  . meloxicam (MOBIC) 15 MG tablet TAKE 1 TABLET BY MOUTH ONCE DAILY AS NEEDED FOR UNCONTROLLED ARTHRITIS PAIN  . spironolactone (ALDACTONE)  50 MG tablet TAKE 1 TABLET BY MOUTH EVERY DAY  . tiZANidine (ZANAFLEX) 4 MG tablet TAKE 1 TABLET (4 MG TOTAL) BY MOUTH EVERY 8 (EIGHT) HOURS AS NEEDED FOR MUSCLE SPASMS  . UNABLE TO FIND Nebulizer facemask and tubing x 1  DX J44.9   No current facility-administered medications on file prior to visit.     Allergies  Allergen Reactions  . Penicillins Hives and Other (See Comments)    Has patient had a PCN reaction causing immediate rash, facial/tongue/throat swelling, SOB or lightheadedness with hypotension: Unknown Has patient had a PCN reaction causing severe rash involving mucus membranes or skin necrosis: No Has patient had a PCN reaction that required hospitalization: Yes Has patient had a PCN reaction occurring within the last 10 years: No If all  of the above answers are "NO", then may proceed with Cephalosporin use.     Review Of Systems:  Constitutional:   +  weight loss, night sweats,  Fevers, chills, fatigue, or  lassitude.  HEENT:   No headaches,  Difficulty swallowing,  Tooth/dental problems, or  Sore throat,                No sneezing, itching, ear ache, nasal congestion, post nasal drip,   CV:  No chest pain,  Orthopnea, PND, swelling in lower extremities, anasarca, dizziness, palpitations, syncope.   GI  No heartburn, indigestion, abdominal pain, nausea, vomiting, diarrhea, change in bowel habits, loss of appetite, bloody stools.   Resp: No shortness of breath with exertion or at rest.  No excess mucus, no productive cough,  No non-productive cough,  No coughing up of blood.  No change in color of mucus.  No wheezing.  No chest wall deformity  Skin: no rash or lesions.  GU: no dysuria, change in color of urine, no urgency or frequency.  No flank pain, no hematuria   MS:  No joint pain or swelling.  No decreased range of motion.  No back pain.  Psych:  No change in mood or affect. No depression or anxiety.  No memory loss.   Vital Signs BP (!) 142/76 (BP Location: Right Arm)   Pulse 92   Temp 97.8 F (36.6 C) (Oral)   Ht 6' 2.5" (1.892 m)   Wt 152 lb 6.4 oz (69.1 kg)   SpO2 97%   BMI 19.31 kg/m    Physical Exam:  General- No distress,  A&Ox3, pleasant ENT: No sinus tenderness, TM clear, pale nasal mucosa, no oral exudate,no post nasal drip, no LAN Cardiac: S1, S2, regular rate and rhythm, no murmur Chest: No wheeze/ rales/ dullness; no accessory muscle use, no nasal flaring, no sternal retractions Abd.: Soft Non-tender, ND, BS +, Body mass index is 19.31 kg/m. Ext: No clubbing cyanosis, edema Neuro:  normal strength, Cranial nerves intact Skin: No rashes, warm and dry, no lesions Psych: normal mood and behavior   Assessment/Plan  New interval development of a focal, nodular area of masslike  architectural distortion with spiculated peripheral margins within the right upper lobe. This has an equivalent diameter of 15.6 mm. Plan We will do a PET scan . Someone will call you to get this scheduled We will order Pulmonary Function Tests Someone will call you to get these scheduled. We will call you on your cell phone with the results, and any further testing that needs to be done. Work on quitting smoking. Please check your cell phone ( the number we will call ) for missed calls, and  call us back that same day if possible.  Follow up after PFT's and PET Please contact office for sooner follow up if symptoms do not improve or worsen or seek emergency care   Tobacco Abuse Plan I have spent 3 minutes counseling patient on smoking cessation this visit. Patient verbalizes understanding of their  choice to continue smoking and the negative health consequences including worsening of COPD, risk of lung cancer , stroke and heart disease. Follow up PET and PFT's     Magdalen Spatz, NP 07/09/2020  9:36 AM

## 2020-07-09 NOTE — Patient Instructions (Signed)
It is good to see you today. We will do a PET scan . Someone will call you to get this scheduled We will order Pulmonary Function Tests Someone will call you to get these scheduled. We will call you on your cell phone with the results, and any further testing that needs to be done. Work on quitting smoking. Please check your cell phone ( the number we will call ) for missed calls, and call us back that same day if possible.  Follow up after PFT's and PET Please contact office for sooner follow up if symptoms do not improve or worsen or seek emergency care

## 2020-07-11 ENCOUNTER — Other Ambulatory Visit: Payer: Self-pay | Admitting: Orthopaedic Surgery

## 2020-07-11 ENCOUNTER — Other Ambulatory Visit: Payer: Self-pay

## 2020-07-11 ENCOUNTER — Ambulatory Visit (INDEPENDENT_AMBULATORY_CARE_PROVIDER_SITE_OTHER): Payer: Medicare Other | Admitting: Internal Medicine

## 2020-07-11 DIAGNOSIS — R911 Solitary pulmonary nodule: Secondary | ICD-10-CM | POA: Diagnosis not present

## 2020-07-11 LAB — PULMONARY FUNCTION TEST
DL/VA % pred: 55 %
DL/VA: 2.25 ml/min/mmHg/L
DLCO cor % pred: 56 %
DLCO cor: 16.43 ml/min/mmHg
DLCO unc % pred: 56 %
DLCO unc: 16.43 ml/min/mmHg
FEF 25-75 Post: 1.36 L/sec
FEF 25-75 Pre: 0.91 L/sec
FEF2575-%Change-Post: 49 %
FEF2575-%Pred-Post: 46 %
FEF2575-%Pred-Pre: 31 %
FEV1-%Change-Post: 28 %
FEV1-%Pred-Post: 74 %
FEV1-%Pred-Pre: 57 %
FEV1-Post: 2.48 L
FEV1-Pre: 1.93 L
FEV1FVC-%Change-Post: 17 %
FEV1FVC-%Pred-Pre: 60 %
FEV6-%Change-Post: 11 %
FEV6-%Pred-Post: 107 %
FEV6-%Pred-Pre: 96 %
FEV6-Post: 4.48 L
FEV6-Pre: 4.02 L
FEV6FVC-%Change-Post: 2 %
FEV6FVC-%Pred-Post: 103 %
FEV6FVC-%Pred-Pre: 101 %
FVC-%Change-Post: 9 %
FVC-%Pred-Post: 103 %
FVC-%Pred-Pre: 94 %
FVC-Post: 4.51 L
FVC-Pre: 4.13 L
Post FEV1/FVC ratio: 55 %
Post FEV6/FVC ratio: 99 %
Pre FEV1/FVC ratio: 47 %
Pre FEV6/FVC Ratio: 97 %
RV % pred: 158 %
RV: 4.05 L
TLC % pred: 110 %
TLC: 8.41 L

## 2020-07-11 NOTE — Progress Notes (Signed)
Full PFT performed today. °

## 2020-07-12 MED ORDER — HYDROCODONE-ACETAMINOPHEN 5-325 MG PO TABS
ORAL_TABLET | ORAL | 0 refills | Status: DC
Start: 2020-07-12 — End: 2020-08-16

## 2020-07-18 ENCOUNTER — Other Ambulatory Visit: Payer: Self-pay

## 2020-07-18 ENCOUNTER — Encounter (INDEPENDENT_AMBULATORY_CARE_PROVIDER_SITE_OTHER): Payer: Self-pay | Admitting: *Deleted

## 2020-07-18 ENCOUNTER — Ambulatory Visit (INDEPENDENT_AMBULATORY_CARE_PROVIDER_SITE_OTHER): Payer: Medicare Other | Admitting: Family Medicine

## 2020-07-18 ENCOUNTER — Encounter: Payer: Self-pay | Admitting: Family Medicine

## 2020-07-18 VITALS — BP 131/82 | HR 87 | Ht 74.5 in | Wt 153.0 lb

## 2020-07-18 DIAGNOSIS — F17210 Nicotine dependence, cigarettes, uncomplicated: Secondary | ICD-10-CM | POA: Diagnosis not present

## 2020-07-18 DIAGNOSIS — I10 Essential (primary) hypertension: Secondary | ICD-10-CM

## 2020-07-18 DIAGNOSIS — R918 Other nonspecific abnormal finding of lung field: Secondary | ICD-10-CM

## 2020-07-18 DIAGNOSIS — Z1211 Encounter for screening for malignant neoplasm of colon: Secondary | ICD-10-CM

## 2020-07-18 DIAGNOSIS — M5442 Lumbago with sciatica, left side: Secondary | ICD-10-CM

## 2020-07-18 DIAGNOSIS — F17200 Nicotine dependence, unspecified, uncomplicated: Secondary | ICD-10-CM

## 2020-07-18 DIAGNOSIS — R9389 Abnormal findings on diagnostic imaging of other specified body structures: Secondary | ICD-10-CM | POA: Diagnosis not present

## 2020-07-18 DIAGNOSIS — I70213 Atherosclerosis of native arteries of extremities with intermittent claudication, bilateral legs: Secondary | ICD-10-CM

## 2020-07-18 DIAGNOSIS — E785 Hyperlipidemia, unspecified: Secondary | ICD-10-CM

## 2020-07-18 DIAGNOSIS — Z2882 Immunization not carried out because of caregiver refusal: Secondary | ICD-10-CM

## 2020-07-18 DIAGNOSIS — E441 Mild protein-calorie malnutrition: Secondary | ICD-10-CM

## 2020-07-18 DIAGNOSIS — R972 Elevated prostate specific antigen [PSA]: Secondary | ICD-10-CM

## 2020-07-18 DIAGNOSIS — G8929 Other chronic pain: Secondary | ICD-10-CM

## 2020-07-18 MED ORDER — UNABLE TO FIND: 11 refills | Status: DC

## 2020-07-18 NOTE — Assessment & Plan Note (Signed)
Hyperlipidemia:Low fat diet discussed and encouraged.   Lipid Panel  Lab Results  Component Value Date   CHOL 199 04/16/2020   HDL 72 04/16/2020   LDLCALC 110 (H) 04/16/2020   TRIG 83 04/16/2020   CHOLHDL 2.8 04/16/2020  needs to reduce fried and fatty foods

## 2020-07-18 NOTE — Progress Notes (Signed)
Dillon [name]     MRN: [id number]      DOB: 04-05-1951   HPI Mr. Bauserman is here for follow up and re-evaluation of chronic medical conditions, medication Main concern is poor appetite, early satiety and failure to gain weight. Wants supplements to help nutritional status .  Preventive health is updated, specifically  Cancer screening and Immunization.  Refuses flu vaccine Questions or concerns regarding consultations or procedures which the PT has had in the interim are  Addressed.Has PET scan next Monday and needs to see pulmonary Doc The PT denies any adverse reactions to current medications since the last visit.  Denies change in bowel habit   ROS Denies recent fever or chills. Denies sinus pressure, nasal congestion, ear pain or sore throat. Denies chest congestion, productive cough or wheezing.chronic poor exercise tolerance Denies chest pains, palpitations and leg swelling Denies abdominal pain, nausea, vomiting,diarrhea or constipation.   Denies dysuria, frequency, hesitancy or incontinence. Denies uncontrolled  joint pain, swelling and limitation in mobility. Denies headaches, seizures, numbness, or tingling. Denies depression, anxiety or insomnia. Denies skin break down or rash.   PE  BP 131/82   Pulse 87   Ht 6' 2.5" (1.892 m)   Wt 153 lb 0.6 oz (69.4 kg)   SpO2 97%   BMI 19.39 kg/m   Patient alert and oriented and in no cardiopulmonary distress.  HEENT: No facial asymmetry, EOMI,     Neck supple .  Chest: Clear to auscultation bilaterally.decreased air entry  CVS: S1, S2 no murmurs, no S3.Regular rate.  ABD: Soft non tender.   Ext: No edema  MS: decreased  ROM spine, shoulders, hips and knees.  Skin: Intact, no ulcerations or rash noted.  Psych: Good eye contact, normal affect. Memory intact not anxious or depressed appearing.  CNS: CN 2-12 intact, power,  normal throughout.no focal deficits noted.   Assessment & Plan  Essential  hypertension Controlled, no change in medication DASH diet and commitment to daily physical activity for a minimum of 30 minutes discussed and encouraged, as a part of hypertension management. The importance of attaining a healthy weight is also discussed.  BP/Weight 07/18/2020 07/09/2020 05/17/2020 05/15/2020 04/11/2020 02/17/2020 4/94/4967  Systolic BP 591 638 466 599 357 017 793  Diastolic BP 82 76 72 89 81 79 84  Wt. (Lbs) 153.04 152.4 153.5 153.13 155.08 155.9 158.8  BMI 19.39 19.31 19.44 19.4 20.18 19.75 20.12       NICOTINE ADDICTION Asked:confirms currently smokes cigarettes Assess: Unwilling to set a quit date, but is cutting back Advise: needs to QUIT to reduce risk of cancer, cardio and cerebrovascular disease Assist: counseled for 5 minutes and literature provided Arrange: follow up in 2 to 4 months   Elevated PSA F/u Urology in January 2022  Vaccine refused by parent Discuseed, encouraged and refuses flu vaccine  Hyperlipidemia LDL goal <70 Hyperlipidemia:Low fat diet discussed and encouraged.   Lipid Panel  Lab Results  Component Value Date   CHOL 199 04/16/2020   HDL 72 04/16/2020   LDLCALC 110 (H) 04/16/2020   TRIG 83 04/16/2020   CHOLHDL 2.8 04/16/2020  needs to reduce fried and fatty foods     Malnutrition of mild degree (HCC) repors 3 month h/o poor appetite, early satiety and failure to gain weight requests nutrtional supplement , will order based on symptoms and mild malnutrition  Lung nodules New lesion with spiculation, has PET scan and will refer to pulmonary for f/u and management  Low  back pain with left-sided sciatica Managed by orthopedics

## 2020-07-18 NOTE — Assessment & Plan Note (Addendum)
New lesion with spiculation, has PET scan and already being seen by  pulmonary for f/u and management

## 2020-07-18 NOTE — Assessment & Plan Note (Signed)
repors 3 month h/o poor appetite, early satiety and failure to gain weight requests nutrtional supplement , will order based on symptoms and mild malnutrition

## 2020-07-18 NOTE — Assessment & Plan Note (Signed)
Controlled, no change in medication DASH diet and commitment to daily physical activity for a minimum of 30 minutes discussed and encouraged, as a part of hypertension management. The importance of attaining a healthy weight is also discussed.  BP/Weight 07/18/2020 07/09/2020 05/17/2020 05/15/2020 04/11/2020 02/17/2020 0/78/6754  Systolic BP 492 010 071 219 758 832 549  Diastolic BP 82 76 72 89 81 79 84  Wt. (Lbs) 153.04 152.4 153.5 153.13 155.08 155.9 158.8  BMI 19.39 19.31 19.44 19.4 20.18 19.75 20.12

## 2020-07-18 NOTE — Assessment & Plan Note (Signed)
Discuseed, encouraged and refuses flu vaccine

## 2020-07-18 NOTE — Patient Instructions (Addendum)
u in office in 4.5 months, call if you need me before  You are referred to lung specialist re abnormal lung scan  You are referred to Dr Olevia Perches office for colonoscopy.  We will attempt to get ensure 2 cans daily as you are underweight and report poor appetite  New to help with stopping smoking are nicotine lozengers, use these in place of cigarettes  Your appointment with Dr Alyson Ingles re your prostate is already scheduled for January 2022   Steps to Quit Smoking Smoking tobacco is the leading cause of preventable death. It can affect almost every organ in the body. Smoking puts you and people around you at risk for many serious, long-lasting (chronic) diseases. Quitting smoking can be hard, but it is one of the best things that you can do for your health. It is never too late to quit. How do I get ready to quit? When you decide to quit smoking, make a plan to help you succeed. Before you quit:  Pick a date to quit. Set a date within the next 2 weeks to give you time to prepare.  Write down the reasons why you are quitting. Keep this list in places where you will see it often.  Tell your family, friends, and co-workers that you are quitting. Their support is important.  Talk with your doctor about the choices that may help you quit.  Find out if your health insurance will pay for these treatments.  Know the people, places, things, and activities that make you want to smoke (triggers). Avoid them. What first steps can I take to quit smoking?  Throw away all cigarettes at home, at work, and in your car.  Throw away the things that you use when you smoke, such as ashtrays and lighters.  Clean your car. Make sure to empty the ashtray.  Clean your home, including curtains and carpets. What can I do to help me quit smoking? Talk with your doctor about taking medicines and seeing a counselor at the same time. You are more likely to succeed when you do both.  If you are pregnant or  breastfeeding, talk with your doctor about counseling or other ways to quit smoking. Do not take medicine to help you quit smoking unless your doctor tells you to do so. To quit smoking: Quit right away  Quit smoking totally, instead of slowly cutting back on how much you smoke over a period of time.  Go to counseling. You are more likely to quit if you go to counseling sessions regularly. Take medicine You may take medicines to help you quit. Some medicines need a prescription, and some you can buy over-the-counter. Some medicines may contain a drug called nicotine to replace the nicotine in cigarettes. Medicines may:  Help you to stop having the desire to smoke (cravings).  Help to stop the problems that come when you stop smoking (withdrawal symptoms). Your doctor may ask you to use:  Nicotine patches, gum, or lozenges.  Nicotine inhalers or sprays.  Non-nicotine medicine that is taken by mouth. Find resources Find resources and other ways to help you quit smoking and remain smoke-free after you quit. These resources are most helpful when you use them often. They include:  Online chats with a Social worker.  Phone quitlines.  Printed Furniture conservator/restorer.  Support groups or group counseling.  Text messaging programs.  Mobile phone apps. Use apps on your mobile phone or tablet that can help you stick to your quit plan. There  are many free apps for mobile phones and tablets as well as websites. Examples include Quit Guide from the [employer] and smokefree.gov  What things can I do to make it easier to quit?   Talk to your family and friends. Ask them to support and encourage you.  Call a phone quitline (1-800-QUIT-NOW), reach out to support groups, or work with a Social worker.  Ask people who smoke to not smoke around you.  Avoid places that make you want to smoke, such as: ? Bars. ? Parties. ? Smoke-break areas at work.  Spend time with people who do not smoke.  Lower the stress  in your life. Stress can make you want to smoke. Try these things to help your stress: ? Getting regular exercise. ? Doing deep-breathing exercises. ? Doing yoga. ? Meditating. ? Doing a body scan. To do this, close your eyes, focus on one area of your body at a time from head to toe. Notice which parts of your body are tense. Try to relax the muscles in those areas. How will I feel when I quit smoking? Day 1 to 3 weeks Within the first 24 hours, you may start to have some problems that come from quitting tobacco. These problems are very bad 2-3 days after you quit, but they do not often last for more than 2-3 weeks. You may get these symptoms:  Mood swings.  Feeling restless, nervous, angry, or annoyed.  Trouble concentrating.  Dizziness.  Strong desire for high-sugar foods and nicotine.  Weight gain.  Trouble pooping (constipation).  Feeling like you may vomit (nausea).  Coughing or a sore throat.  Changes in how the medicines that you take for other issues work in your body.  Depression.  Trouble sleeping (insomnia). Week 3 and afterward After the first 2-3 weeks of quitting, you may start to notice more positive results, such as:  Better sense of smell and taste.  Less coughing and sore throat.  Slower heart rate.  Lower blood pressure.  Clearer skin.  Better breathing.  Fewer sick days. Quitting smoking can be hard. Do not give up if you fail the first time. Some people need to try a few times before they succeed. Do your best to stick to your quit plan, and talk with your doctor if you have any questions or concerns. Summary  Smoking tobacco is the leading cause of preventable death. Quitting smoking can be hard, but it is one of the best things that you can do for your health.  When you decide to quit smoking, make a plan to help you succeed.  Quit smoking right away, not slowly over a period of time.  When you start quitting, seek help from your  doctor, family, or friends. This information is not intended to replace advice given to you by your health care provider. Make sure you discuss any questions you have with your health care provider. Document Revised: 07/08/2019 Document Reviewed: 01/01/2019 Elsevier Patient Education  [employer].

## 2020-07-18 NOTE — Assessment & Plan Note (Signed)
Managed by orthopedics

## 2020-07-18 NOTE — Assessment & Plan Note (Signed)
Asked:confirms currently smokes cigarettes °Assess: Unwilling to set a quit date, but is cutting back °Advise: needs to QUIT to reduce risk of cancer, cardio and cerebrovascular disease °Assist: counseled for 5 minutes and literature provided °Arrange: follow up in 2 to 4 months ° °

## 2020-07-18 NOTE — Assessment & Plan Note (Signed)
F/u Urology in January 2022

## 2020-07-19 NOTE — Progress Notes (Signed)
Pt. Seen in office and PET scan scheduled for 9/27.

## 2020-07-23 ENCOUNTER — Encounter (HOSPITAL_COMMUNITY)
Admission: RE | Admit: 2020-07-23 | Discharge: 2020-07-23 | Disposition: A | Discharge status: Home / Self Care | Payer: Medicare Other | Source: Ambulatory Visit | Attending: Acute Care | Admitting: Acute Care

## 2020-07-23 ENCOUNTER — Other Ambulatory Visit: Payer: Self-pay

## 2020-07-23 DIAGNOSIS — R911 Solitary pulmonary nodule: Secondary | ICD-10-CM | POA: Diagnosis present

## 2020-07-23 MED ORDER — FLUDEOXYGLUCOSE F - 18 (FDG) INJECTION
8.8600 | Freq: Once | INTRAVENOUS | Status: AC | PRN
  Administered 2020-07-23: 8.86 via INTRAVENOUS

## 2020-07-26 NOTE — Progress Notes (Signed)
I have called the results of the PET scan to the patient  I explained that the PET scan showed  no findings suspicious for hypermetabolic metastatic disease. I did explain that we need to do a 3 month follow up CT scan to re-evaluate the nodules in his lungs. I explained that we will call him to get this scheduled for late December 2021 ( 10/22/2020 is 3 months to the day of the PET). He verbalized understanding and had no further questions at completion of the call. He does have my contact information in the event he has any further questions.

## 2020-07-26 NOTE — Progress Notes (Signed)
Please fax results to the PCP and place order for 3 month follow up CT Chest with Contrast, and BMET to be drawn prior. Thanks so much

## 2020-07-27 ENCOUNTER — Other Ambulatory Visit: Payer: Self-pay | Admitting: *Deleted

## 2020-07-27 DIAGNOSIS — R911 Solitary pulmonary nodule: Secondary | ICD-10-CM

## 2020-08-13 ENCOUNTER — Other Ambulatory Visit: Payer: Self-pay

## 2020-08-13 MED ORDER — TIZANIDINE HCL 4 MG PO TABS: 4.0000 mg | ORAL_TABLET | Freq: Three times a day (TID) | ORAL | 3 refills | Status: DC | PRN

## 2020-08-14 ENCOUNTER — Ambulatory Visit (INDEPENDENT_AMBULATORY_CARE_PROVIDER_SITE_OTHER): Payer: Medicare Other | Admitting: Orthopaedic Surgery

## 2020-08-14 ENCOUNTER — Encounter: Payer: Self-pay | Admitting: Orthopaedic Surgery

## 2020-08-14 ENCOUNTER — Other Ambulatory Visit: Payer: Self-pay

## 2020-08-14 VITALS — BP 138/87 | HR 87 | Ht 74.0 in | Wt 153.0 lb

## 2020-08-14 DIAGNOSIS — I70213 Atherosclerosis of native arteries of extremities with intermittent claudication, bilateral legs: Secondary | ICD-10-CM | POA: Diagnosis not present

## 2020-08-14 DIAGNOSIS — M5442 Lumbago with sciatica, left side: Secondary | ICD-10-CM

## 2020-08-14 DIAGNOSIS — F17210 Nicotine dependence, cigarettes, uncomplicated: Secondary | ICD-10-CM

## 2020-08-14 DIAGNOSIS — G8929 Other chronic pain: Secondary | ICD-10-CM | POA: Diagnosis not present

## 2020-08-14 NOTE — Progress Notes (Signed)
[name], male DOB:06/03/1951, 69 y.o. [id number]  Chief Complaint  Patient presents with  . Back Pain  . Knee Pain    bilateral     HPI  Dillon [name] is a 69 y.o. male who has chronic lower back pain. He had an episode of pain a couple of weeks ago that laid him down some.  He is better now.  He got refill on Zanaflex yesterday from Dr. Moshe Cipro.  He is taking his pain medicine.  He has no weakness, no new trauma.   Body mass index is 19.64 kg/m.  ROS  Review of Systems  Respiratory: Positive for shortness of breath. Negative for cough.   Cardiovascular: Negative for chest pain and leg swelling.  Endocrine: Negative for cold intolerance.  Musculoskeletal: Positive for arthralgias and myalgias.  Allergic/Immunologic: Positive for environmental allergies.  All other systems reviewed and are negative.   All other systems reviewed and are negative.  The following is a summary of the past history medically, past history surgically, known current medicines, social history and family history.  This information is gathered electronically by the computer from prior information and documentation.  I review this each visit and have found including this information at this point in the chart is beneficial and informative.    Past Medical History:  Diagnosis Date  . Arthritis    2012  . Asthma    childhood  . COPD (chronic obstructive pulmonary disease) ([place])   . Hyperlipidemia    1990  . Hypertension    1990  . Nicotine addiction   . PAD (peripheral artery disease) ([place]) 2015    Past Surgical History:  Procedure Laterality Date  . KNEE ARTHROSCOPY     both knees   . LOWER EXTREMITY ANGIOGRAPHY N/A 02/04/2018   Procedure: LOWER EXTREMITY ANGIOGRAPHY;  Surgeon: Conrad Hugo, MD;  Location: [facility];  Service: Cardiovascular;  Laterality: N/A;    Family History  Problem Relation Age of Onset  . Heart attack Mother   . Diabetes Mother   .  Hypertension Mother   . Stroke Brother   . Hypertension Sister   . Hypertension Sister   . Hypertension Sister   . Hypertension Brother   . Colon cancer Neg Hx     Social History Social History   Tobacco Use  . Smoking status: Current Some Day Smoker    Years: 48.00    Types: Cigarettes    Start date: 10/16/1969  . Smokeless tobacco: Never Used  Vaping Use  . Vaping Use: Never used  Substance Use Topics  . Alcohol use: Yes    Alcohol/week: 0.0 standard drinks    Comment: occassional , max of 72 oz pwr month  . Drug use: No    Allergies  Allergen Reactions  . Penicillins Hives and Other (See Comments)    Has patient had a PCN reaction causing immediate rash, facial/tongue/throat swelling, SOB or lightheadedness with hypotension: Unknown Has patient had a PCN reaction causing severe rash involving mucus membranes or skin necrosis: No Has patient had a PCN reaction that required hospitalization: Yes Has patient had a PCN reaction occurring within the last 10 years: No If all of the above answers are "NO", then may proceed with Cephalosporin use.     Current Outpatient Medications  Medication Sig Dispense Refill  . amLODipine (NORVASC) 10 MG tablet TAKE 1 TABLET BY MOUTH EVERY DAY 90 tablet 1  . aspirin EC 81 MG tablet  Take 81 mg by mouth daily.    Marland Kitchen atorvastatin (LIPITOR) 80 MG tablet TAKE 1 TABLET BY MOUTH EVERY DAY 90 tablet 3  . BREO ELLIPTA 200-25 MCG/INH AEPB INHALE 1 PUFF BY MOUTH EVERY DAY 60 each 5  . ergocalciferol (VITAMIN D2) 1.25 MG (50000 UT) capsule Take 1 capsule (50,000 Units total) by mouth once a week. One capsule once weekly 12 capsule 2  . gabapentin (NEURONTIN) 300 MG capsule TAKE 1 CAPSULE (300 MG TOTAL) BY MOUTH AT BEDTIME. 30 capsule 2  . HYDROcodone-acetaminophen (NORCO/VICODIN) 5-325 MG tablet One tablet every six hours as needed for pain. 100 tablet 0  . ipratropium (ATROVENT) 0.02 % nebulizer solution Take 2.5 mLs (0.5 mg total) by nebulization  every 6 (six) hours as needed for wheezing or shortness of breath. 75 mL 12  . levalbuterol (XOPENEX) 0.63 MG/3ML nebulizer solution Take 3 mLs (0.63 mg total) by nebulization every 8 (eight) hours as needed for wheezing or shortness of breath. 3 mL 12  . meloxicam (MOBIC) 15 MG tablet TAKE 1 TABLET BY MOUTH ONCE DAILY AS NEEDED FOR UNCONTROLLED ARTHRITIS PAIN 90 tablet 0  . spironolactone (ALDACTONE) 50 MG tablet TAKE 1 TABLET BY MOUTH EVERY DAY 90 tablet 1  . tiZANidine (ZANAFLEX) 4 MG tablet Take 1 tablet (4 mg total) by mouth every 8 (eight) hours as needed for muscle spasms. 60 tablet 3  . UNABLE TO FIND Nebulizer facemask and tubing x 1  DX J44.9 1 each 0  . UNABLE TO FIND Ensure- 2 cans daily  DX protein-calorie malnutrition 60 each 11   No current facility-administered medications for this visit.     Physical Exam  Blood pressure 138/87, pulse 87, height 6\' 2"  (1.88 m), weight 153 lb (69.4 kg).  Constitutional: overall normal hygiene, normal nutrition, well developed, normal grooming, normal body habitus. Assistive device:none  Musculoskeletal: gait and station Limp none, muscle tone and strength are normal, no tremors or atrophy is present.  .  Neurological: coordination overall normal.  Deep tendon reflex/nerve stretch intact.  Sensation normal.  Cranial nerves II-XII intact.   Skin:   Normal overall no scars, lesions, ulcers or rashes. No psoriasis.  Psychiatric: Alert and oriented x 3.  Recent memory intact, remote memory unclear.  Normal mood and affect. Well groomed.  Good eye contact.  Cardiovascular: overall no swelling, no varicosities, no edema bilaterally, normal temperatures of the legs and arms, no clubbing, cyanosis and good capillary refill.  Lymphatic: palpation is normal.  Spine/Pelvis examination:  Inspection:  Overall, sacoiliac joint benign and hips nontender; without crepitus or defects.   Thoracic spine inspection: Alignment normal without kyphosis  present   Lumbar spine inspection:  Alignment  with normal lumbar lordosis, without scoliosis apparent.   Thoracic spine palpation:  without tenderness of spinal processes   Lumbar spine palpation: without tenderness of lumbar area; without tightness of lumbar muscles    Range of Motion:   Lumbar flexion, forward flexion is normal without pain or tenderness    Lumbar extension is full without pain or tenderness   Left lateral bend is normal without pain or tenderness   Right lateral bend is normal without pain or tenderness   Straight leg raising is normal  Strength & tone: normal   Stability overall normal stability  All other systems reviewed and are negative   The patient has been educated about the nature of the problem(s) and counseled on treatment options.  The patient appeared to understand what I  have discussed and is in agreement with it.  Encounter Diagnoses  Name Primary?  . Chronic midline low back pain with left-sided sciatica Yes  . Cigarette nicotine dependence without complication     PLAN Call if any problems.  Precautions discussed.  Continue current medications.   Return to clinic 3 months   Electronically Signed Sanjuana Kava, MD 10/19/20218:14 AM

## 2020-08-16 ENCOUNTER — Telehealth: Payer: Self-pay | Admitting: Orthopaedic Surgery

## 2020-08-16 MED ORDER — HYDROCODONE-ACETAMINOPHEN 5-325 MG PO TABS
ORAL_TABLET | ORAL | 0 refills | Status: DC
Start: 2020-08-16 — End: 2020-09-12

## 2020-08-16 NOTE — Telephone Encounter (Signed)
Patient relays his medication was not sent to pharmacy at time of visit 08/14/20? HYDROcodone-acetaminophen (NORCO/VICODIN) 5-325 MG tablet 100 tablet  -[facility], Linna Hoff

## 2020-09-03 ENCOUNTER — Other Ambulatory Visit: Payer: Self-pay | Admitting: Family Medicine

## 2020-09-03 DIAGNOSIS — I10 Essential (primary) hypertension: Secondary | ICD-10-CM

## 2020-09-12 ENCOUNTER — Other Ambulatory Visit: Payer: Self-pay | Admitting: Orthopaedic Surgery

## 2020-09-12 MED ORDER — HYDROCODONE-ACETAMINOPHEN 5-325 MG PO TABS: ORAL_TABLET | ORAL | 0 refills | Status: DC

## 2020-10-04 ENCOUNTER — Other Ambulatory Visit: Payer: Self-pay

## 2020-10-04 ENCOUNTER — Other Ambulatory Visit (HOSPITAL_COMMUNITY)
Admission: RE | Admit: 2020-10-04 | Discharge: 2020-10-04 | Disposition: A | Discharge status: Home / Self Care | Payer: Medicare Other | Source: Ambulatory Visit | Attending: Acute Care | Admitting: Acute Care

## 2020-10-04 DIAGNOSIS — R911 Solitary pulmonary nodule: Secondary | ICD-10-CM | POA: Insufficient documentation

## 2020-10-04 LAB — BASIC METABOLIC PANEL
Anion gap: 7 (ref 5–15)
BUN: 14 mg/dL (ref 8–23)
CO2: 27 mmol/L (ref 22–32)
Calcium: 9.3 mg/dL (ref 8.9–10.3)
Chloride: 98 mmol/L (ref 98–111)
Creatinine, Ser: 0.99 mg/dL (ref 0.61–1.24)
GFR, Estimated: >60 mL/min (ref >60)
Glucose, Bld: 119 mg/dL — ABNORMAL HIGH (ref 70–99)
Potassium: 3.7 mmol/L (ref 3.5–5.1)
Sodium: 132 mmol/L — ABNORMAL LOW (ref 135–145)

## 2020-10-11 ENCOUNTER — Other Ambulatory Visit: Payer: Self-pay | Admitting: Orthopaedic Surgery

## 2020-10-11 MED ORDER — HYDROCODONE-ACETAMINOPHEN 5-325 MG PO TABS: 1.0000 | ORAL_TABLET | Freq: Four times a day (QID) | ORAL | 0 refills | Status: DC | PRN

## 2020-10-11 NOTE — Telephone Encounter (Signed)
Message received - Dr. Luna Glasgow out of the office.  Provided a prescription for Hydrocodone 5 mg x 20 pills.  Maximum 5 day supply provided.    Ainslie Mazurek A. Amedeo Kinsman, MD [facility] [address] [place],  [place]  [address] Phone: [phone] Fax: [phone]

## 2020-10-15 ENCOUNTER — Other Ambulatory Visit: Payer: Self-pay

## 2020-10-15 ENCOUNTER — Ambulatory Visit (HOSPITAL_COMMUNITY)
Admission: RE | Admit: 2020-10-15 | Discharge: 2020-10-15 | Disposition: A | Discharge status: Home / Self Care | Payer: Medicare Other | Source: Ambulatory Visit | Attending: Acute Care | Admitting: Acute Care

## 2020-10-15 DIAGNOSIS — R911 Solitary pulmonary nodule: Secondary | ICD-10-CM | POA: Diagnosis present

## 2020-10-15 MED ORDER — IOHEXOL 300 MG/ML  SOLN
75.0000 mL | Freq: Once | INTRAMUSCULAR | Status: AC | PRN
  Administered 2020-10-15: 75 mL via INTRAVENOUS

## 2020-10-16 MED ORDER — HYDROCODONE-ACETAMINOPHEN 5-325 MG PO TABS: 1.0000 | ORAL_TABLET | Freq: Four times a day (QID) | ORAL | 0 refills | Status: AC | PRN

## 2020-10-24 ENCOUNTER — Telehealth: Payer: Self-pay | Admitting: Orthopaedic Surgery

## 2020-10-24 MED ORDER — HYDROCODONE-ACETAMINOPHEN 5-325 MG PO TABS
1.0000 | ORAL_TABLET | Freq: Four times a day (QID) | ORAL | 0 refills | Status: AC | PRN
Start: 2020-10-24 — End: 2020-10-29

## 2020-10-24 NOTE — Telephone Encounter (Signed)
Patient of Dr Sanjuan Dame requests refill on  Hydrocodone/Acetaminophen 5-325 mgs.  Qty  20  Sig: Take 1 tablet by mouth every 6 (six) hours as needed for up to 5 days for moderate pain. One tablet every six hours as needed for pain  Patient uses [facility]

## 2020-10-29 NOTE — Progress Notes (Signed)
Please call patient and let him know the areas of concern on his CT have resolved on his most recent imaging.  Follow up LDCT in 1 year through the screening program.  Let him know imaging confirms severe emphysema, and CAD. He should follow up with his PCP about the CAD if he is not already seen by cards.  Thanks so much

## 2020-10-30 ENCOUNTER — Other Ambulatory Visit: Payer: Self-pay | Admitting: *Deleted

## 2020-10-30 DIAGNOSIS — F17210 Nicotine dependence, cigarettes, uncomplicated: Secondary | ICD-10-CM

## 2020-10-30 DIAGNOSIS — Z87891 Personal history of nicotine dependence: Secondary | ICD-10-CM

## 2020-11-04 ENCOUNTER — Other Ambulatory Visit: Payer: Self-pay | Admitting: Family Medicine

## 2020-11-05 ENCOUNTER — Telehealth: Payer: Self-pay | Admitting: Orthopaedic Surgery

## 2020-11-05 NOTE — Telephone Encounter (Signed)
Hydrocodone/Acetaminophen 5-325 mgs.  Qty  20  Sig: Take 1 tablet by mouth every 6 (six) hours as needed for up to 5 days for moderate pain. One tablet every six hours as needed for pain  Patient uses [facility]

## 2020-11-06 ENCOUNTER — Other Ambulatory Visit: Payer: Self-pay

## 2020-11-06 ENCOUNTER — Ambulatory Visit: Payer: Medicare Other

## 2020-11-06 ENCOUNTER — Ambulatory Visit (INDEPENDENT_AMBULATORY_CARE_PROVIDER_SITE_OTHER): Payer: Medicare Other | Admitting: Orthopaedic Surgery

## 2020-11-06 ENCOUNTER — Encounter: Payer: Self-pay | Admitting: Orthopaedic Surgery

## 2020-11-06 VITALS — BP 145/89 | HR 81 | Ht 74.5 in | Wt 154.5 lb

## 2020-11-06 DIAGNOSIS — G8929 Other chronic pain: Secondary | ICD-10-CM

## 2020-11-06 DIAGNOSIS — M5442 Lumbago with sciatica, left side: Secondary | ICD-10-CM | POA: Diagnosis not present

## 2020-11-06 DIAGNOSIS — F17210 Nicotine dependence, cigarettes, uncomplicated: Secondary | ICD-10-CM

## 2020-11-06 MED ORDER — PREDNISONE 5 MG (21) PO TBPK: ORAL_TABLET | ORAL | 0 refills | Status: DC

## 2020-11-06 MED ORDER — HYDROCODONE-ACETAMINOPHEN 5-325 MG PO TABS: 1.0000 | ORAL_TABLET | Freq: Four times a day (QID) | ORAL | 0 refills | Status: DC | PRN

## 2020-11-06 MED ORDER — CYCLOBENZAPRINE HCL 10 MG PO TABS
10.0000 mg | ORAL_TABLET | Freq: Every day | ORAL | 0 refills | Status: DC
Start: 2020-11-06 — End: 2020-12-05

## 2020-11-06 NOTE — Patient Instructions (Signed)

## 2020-11-06 NOTE — Progress Notes (Signed)
Patient XL:KGMW Dillon [name], male DOB:January 25, 1951, 70 y.o. [id number]  Chief Complaint  Patient presents with  . Back Pain    Hurting real bad for about a week now.    HPI  Dillon [name] is a 70 y.o. male who has had increasing pain in the lower back with no radiation.  It is getting worse and worse.  He gets massages which help a short time.  He is taking pain medicine which does not help.  He has no trauma, no weakness.  He has used heat and ice which help only a short time.  He is not a complainer and takes a lot of pain to seek help.   Body mass index is 19.57 kg/m.  ROS  Review of Systems  Respiratory: Positive for shortness of breath. Negative for cough.   Cardiovascular: Negative for chest pain and leg swelling.  Endocrine: Negative for cold intolerance.  Musculoskeletal: Positive for arthralgias and myalgias.  Allergic/Immunologic: Positive for environmental allergies.  All other systems reviewed and are negative.   All other systems reviewed and are negative.  The following is a summary of the past history medically, past history surgically, known current medicines, social history and family history.  This information is gathered electronically by the computer from prior information and documentation.  I review this each visit and have found including this information at this point in the chart is beneficial and informative.    Past Medical History:  Diagnosis Date  . Arthritis    2012  . Asthma    childhood  . COPD (chronic obstructive pulmonary disease) ([place])   . Hyperlipidemia    1990  . Hypertension    1990  . Nicotine addiction   . PAD (peripheral artery disease) ([place]) 2015    Past Surgical History:  Procedure Laterality Date  . KNEE ARTHROSCOPY     both knees   . LOWER EXTREMITY ANGIOGRAPHY N/A 02/04/2018   Procedure: LOWER EXTREMITY ANGIOGRAPHY;  Surgeon: Conrad [place], MD;  Location: [facility];  Service: Cardiovascular;  Laterality: N/A;     Family History  Problem Relation Age of Onset  . Heart attack Mother   . Diabetes Mother   . Hypertension Mother   . Stroke Brother   . Hypertension Sister   . Hypertension Sister   . Hypertension Sister   . Hypertension Brother   . Colon cancer Neg Hx     Social History Social History   Tobacco Use  . Smoking status: Current Some Day Smoker    Years: 48.00    Types: Cigarettes    Start date: 10/16/1969  . Smokeless tobacco: Never Used  Vaping Use  . Vaping Use: Never used  Substance Use Topics  . Alcohol use: Yes    Alcohol/week: 0.0 standard drinks    Comment: occassional , max of 72 oz pwr month  . Drug use: No    Allergies  Allergen Reactions  . Penicillins Hives and Other (See Comments)    Has patient had a PCN reaction causing immediate rash, facial/tongue/throat swelling, SOB or lightheadedness with hypotension: Unknown Has patient had a PCN reaction causing severe rash involving mucus membranes or skin necrosis: No Has patient had a PCN reaction that required hospitalization: Yes Has patient had a PCN reaction occurring within the last 10 years: No If all of the above answers are "NO", then may proceed with Cephalosporin use.     Current Outpatient Medications  Medication Sig Dispense Refill  .  amLODipine (NORVASC) 10 MG tablet TAKE 1 TABLET BY MOUTH [name] DAY 90 tablet 1  . aspirin EC 81 MG tablet Take 81 mg by mouth daily.    Marland Kitchen atorvastatin (LIPITOR) 80 MG tablet TAKE 1 TABLET BY MOUTH [name] DAY 90 tablet 3  . BREO ELLIPTA 200-25 MCG/INH AEPB INHALE 1 PUFF BY MOUTH [name] DAY 60 each 5  . ergocalciferol (VITAMIN D2) 1.25 MG (50000 UT) capsule Take 1 capsule (50,000 Units total) by mouth once a week. One capsule once weekly 12 capsule 2  . gabapentin (NEURONTIN) 300 MG capsule TAKE 1 CAPSULE (300 MG TOTAL) BY MOUTH AT BEDTIME. 30 capsule 2  . HYDROcodone-acetaminophen (NORCO/VICODIN) 5-325 MG tablet Take 1 tablet by mouth [name] 6 (six) hours as needed  for moderate pain (Must last 30 days.). 100 tablet 0  . ipratropium (ATROVENT) 0.02 % nebulizer solution Take 2.5 mLs (0.5 mg total) by nebulization [name] 6 (six) hours as needed for wheezing or shortness of breath. 75 mL 12  . levalbuterol (XOPENEX) 0.63 MG/3ML nebulizer solution Take 3 mLs (0.63 mg total) by nebulization [name] 8 (eight) hours as needed for wheezing or shortness of breath. 3 mL 12  . meloxicam (MOBIC) 15 MG tablet TAKE 1 TABLET BY MOUTH ONCE DAILY AS NEEDED FOR UNCONTROLLED ARTHRITIS PAIN 90 tablet 0  . spironolactone (ALDACTONE) 50 MG tablet TAKE 1 TABLET BY MOUTH [name] DAY 90 tablet 1  . tiZANidine (ZANAFLEX) 4 MG tablet Take 1 tablet (4 mg total) by mouth [name] 8 (eight) hours as needed for muscle spasms. 60 tablet 3  . UNABLE TO FIND Nebulizer facemask and tubing x 1  DX J44.9 1 each 0  . UNABLE TO FIND Ensure- 2 cans daily  DX protein-calorie malnutrition 60 each 11   No current facility-administered medications for this visit.     Physical Exam  Blood pressure (!) 145/89, pulse 81, height 6' 2.5" (1.892 m), weight 154 lb 8 oz (70.1 kg).  Constitutional: overall normal hygiene, normal nutrition, well developed, normal grooming, normal body habitus. Assistive device:none  Musculoskeletal: gait and station Limp none, muscle tone and strength are normal, no tremors or atrophy is present.  .  Neurological: coordination overall normal.  Deep tendon reflex/nerve stretch intact.  Sensation normal.  Cranial nerves II-XII intact.   Skin:   Normal overall no scars, lesions, ulcers or rashes. No psoriasis.  Psychiatric: Alert and oriented x 3.  Recent memory intact, remote memory unclear.  Normal mood and affect. Well groomed.  Good eye contact.  Cardiovascular: overall no swelling, no varicosities, no edema bilaterally, normal temperatures of the legs and arms, no clubbing, cyanosis and good capillary refill.  Lymphatic: palpation is normal.  Spine/Pelvis  examination:  Inspection:  Overall, sacoiliac joint benign and hips nontender; without crepitus or defects.   Thoracic spine inspection: Alignment normal without kyphosis present   Lumbar spine inspection:  Alignment  with normal lumbar lordosis, without scoliosis apparent.   Thoracic spine palpation:  without tenderness of spinal processes   Lumbar spine palpation: with tenderness of lumbar area; without tightness of lumbar muscles    Range of Motion:   Lumbar flexion, forward flexion is abnormal with pain or tenderness    Lumbar extension is limited with pain and tenderness   Left lateral bend is abnormal with pain and tenderness   Right lateral bend is abnormal with pain and tenderness   Straight leg raising is normal  Strength & tone: normal   Stability overall normal stability  All other systems reviewed and are negative   The patient has been educated about the nature of the problem(s) and counseled on treatment options.  The patient appeared to understand what I have discussed and is in agreement with it.  Encounter Diagnosis  Name Primary?  . Chronic midline low back pain with left-sided sciatica Yes   X-rays were done of the lumbar spine, reported separately.  PLAN Call if any problems.  Precautions discussed.  Continue current medications. I will begin Prednisone dose pack and Flexeril.  I will get MRI of the lumbar spine.  Return to clinic 1 week   Get MRI.  Electronically Signed Sanjuana Kava, MD 1/11/20223:47 PM

## 2020-11-08 ENCOUNTER — Ambulatory Visit: Payer: Medicare Other | Admitting: Orthopaedic Surgery

## 2020-11-13 ENCOUNTER — Ambulatory Visit (INDEPENDENT_AMBULATORY_CARE_PROVIDER_SITE_OTHER): Payer: Medicare Other | Admitting: Orthopaedic Surgery

## 2020-11-13 ENCOUNTER — Encounter: Payer: Self-pay | Admitting: Orthopaedic Surgery

## 2020-11-13 ENCOUNTER — Ambulatory Visit: Payer: Medicare Other | Admitting: Orthopaedic Surgery

## 2020-11-13 ENCOUNTER — Other Ambulatory Visit: Payer: Self-pay

## 2020-11-13 VITALS — BP 144/87 | HR 80 | Ht 74.5 in | Wt 159.2 lb

## 2020-11-13 DIAGNOSIS — G8929 Other chronic pain: Secondary | ICD-10-CM

## 2020-11-13 DIAGNOSIS — M5442 Lumbago with sciatica, left side: Secondary | ICD-10-CM | POA: Diagnosis not present

## 2020-11-13 DIAGNOSIS — F17210 Nicotine dependence, cigarettes, uncomplicated: Secondary | ICD-10-CM

## 2020-11-13 MED ORDER — PREDNISONE 5 MG (21) PO TBPK: ORAL_TABLET | ORAL | 0 refills | Status: DC

## 2020-11-13 NOTE — Progress Notes (Signed)
[name]:[id number] BOHUMIL KERKMAN, male DOB:1951-03-26, 70 y.o. [id number]  Chief Complaint  Patient presents with  . Back Pain    Its a little better, but it still hurts    HPI  Dillon [name] is a 70 y.o. male who has lower back pain.  He has pain but it is less with the prednisone.  I will continue the prednisone.  He has pain medicine.  His MRI will be 11-20-20.  I will see him on 1-27.  Call if worse.  He has no numbness.   Body mass index is 20.17 kg/m.  ROS  Review of Systems  Respiratory: Positive for shortness of breath. Negative for cough.   Cardiovascular: Negative for chest pain and leg swelling.  Endocrine: Negative for cold intolerance.  Musculoskeletal: Positive for arthralgias and myalgias.  Allergic/Immunologic: Positive for environmental allergies.  All other systems reviewed and are negative.   All other systems reviewed and are negative.  The following is a summary of the past history medically, past history surgically, known current medicines, social history and family history.  This information is gathered electronically by the computer from prior information and documentation.  I review this each visit and have found including this information at this point in the chart is beneficial and informative.    Past Medical History:  Diagnosis Date  . Arthritis    2012  . Asthma    childhood  . COPD (chronic obstructive pulmonary disease) ([place])   . Hyperlipidemia    1990  . Hypertension    1990  . Nicotine addiction   . PAD (peripheral artery disease) ([place]) 2015    Past Surgical History:  Procedure Laterality Date  . KNEE ARTHROSCOPY     both knees   . LOWER EXTREMITY ANGIOGRAPHY N/A 02/04/2018   Procedure: LOWER EXTREMITY ANGIOGRAPHY;  Surgeon: Conrad , MD;  Location: [facility];  Service: Cardiovascular;  Laterality: N/A;    Family History  Problem Relation Age of Onset  . Heart attack Mother   . Diabetes Mother   . Hypertension Mother    . Stroke Brother   . Hypertension Sister   . Hypertension Sister   . Hypertension Sister   . Hypertension Brother   . Colon cancer Neg Hx     Social History Social History   Tobacco Use  . Smoking status: Current Some Day Smoker    Years: 48.00    Types: Cigarettes    Start date: 10/16/1969  . Smokeless tobacco: Never Used  Vaping Use  . Vaping Use: Never used  Substance Use Topics  . Alcohol use: Yes    Alcohol/week: 0.0 standard drinks    Comment: occassional , max of 72 oz pwr month  . Drug use: No    Allergies  Allergen Reactions  . Penicillins Hives and Other (See Comments)    Has patient had a PCN reaction causing immediate rash, facial/tongue/throat swelling, SOB or lightheadedness with hypotension: Unknown Has patient had a PCN reaction causing severe rash involving mucus membranes or skin necrosis: No Has patient had a PCN reaction that required hospitalization: Yes Has patient had a PCN reaction occurring within the last 10 years: No If all of the above answers are "NO", then may proceed with Cephalosporin use.     Current Outpatient Medications  Medication Sig Dispense Refill  . predniSONE (STERAPRED UNI-PAK 21 TAB) 5 MG (21) TBPK tablet Take 6 pills first day; 5 pills second day; 4 pills third day;  3 pills fourth day; 2 pills next day and 1 pill last day. 21 tablet 0  . amLODipine (NORVASC) 10 MG tablet TAKE 1 TABLET BY MOUTH EVERY DAY 90 tablet 1  . aspirin EC 81 MG tablet Take 81 mg by mouth daily.    Marland Kitchen atorvastatin (LIPITOR) 80 MG tablet TAKE 1 TABLET BY MOUTH EVERY DAY 90 tablet 3  . BREO ELLIPTA 200-25 MCG/INH AEPB INHALE 1 PUFF BY MOUTH EVERY DAY 60 each 5  . cyclobenzaprine (FLEXERIL) 10 MG tablet Take 1 tablet (10 mg total) by mouth at bedtime. One tablet every night at bedtime as needed for spasm. 30 tablet 0  . ergocalciferol (VITAMIN D2) 1.25 MG (50000 UT) capsule Take 1 capsule (50,000 Units total) by mouth once a week. One capsule once weekly 12  capsule 2  . gabapentin (NEURONTIN) 300 MG capsule TAKE 1 CAPSULE (300 MG TOTAL) BY MOUTH AT BEDTIME. 30 capsule 2  . HYDROcodone-acetaminophen (NORCO/VICODIN) 5-325 MG tablet Take 1 tablet by mouth every 6 (six) hours as needed for moderate pain (Must last 30 days.). 100 tablet 0  . ipratropium (ATROVENT) 0.02 % nebulizer solution Take 2.5 mLs (0.5 mg total) by nebulization every 6 (six) hours as needed for wheezing or shortness of breath. 75 mL 12  . levalbuterol (XOPENEX) 0.63 MG/3ML nebulizer solution Take 3 mLs (0.63 mg total) by nebulization every 8 (eight) hours as needed for wheezing or shortness of breath. 3 mL 12  . meloxicam (MOBIC) 15 MG tablet TAKE 1 TABLET BY MOUTH ONCE DAILY AS NEEDED FOR UNCONTROLLED ARTHRITIS PAIN 90 tablet 0  . spironolactone (ALDACTONE) 50 MG tablet TAKE 1 TABLET BY MOUTH EVERY DAY 90 tablet 1  . tiZANidine (ZANAFLEX) 4 MG tablet Take 1 tablet (4 mg total) by mouth every 8 (eight) hours as needed for muscle spasms. 60 tablet 3  . UNABLE TO FIND Nebulizer facemask and tubing x 1  DX J44.9 1 each 0  . UNABLE TO FIND Ensure- 2 cans daily  DX protein-calorie malnutrition 60 each 11   No current facility-administered medications for this visit.     Physical Exam  Blood pressure (!) 144/87, pulse 80, height 6' 2.5" (1.892 m), weight 159 lb 4 oz (72.2 kg).  Constitutional: overall normal hygiene, normal nutrition, well developed, normal grooming, normal body habitus. Assistive device:none  Musculoskeletal: gait and station Limp none, muscle tone and strength are normal, no tremors or atrophy is present.  .  Neurological: coordination overall normal.  Deep tendon reflex/nerve stretch intact.  Sensation normal.  Cranial nerves II-XII intact.   Skin:   Normal overall no scars, lesions, ulcers or rashes. No psoriasis.  Psychiatric: Alert and oriented x 3.  Recent memory intact, remote memory unclear.  Normal mood and affect. Well groomed.  Good eye  contact.  Cardiovascular: overall no swelling, no varicosities, no edema bilaterally, normal temperatures of the legs and arms, no clubbing, cyanosis and good capillary refill.  Lymphatic: palpation is normal.  All other systems reviewed and are negative   The patient has been educated about the nature of the problem(s) and counseled on treatment options.  The patient appeared to understand what I have discussed and is in agreement with it.  Encounter Diagnoses  Name Primary?  . Chronic midline low back pain with left-sided sciatica Yes  . Cigarette nicotine dependence without complication     PLAN Call if any problems.  Precautions discussed.  Continue current medications.   Return to clinic 10 days  I will call in new prednisone dose pack as needed.  Get MRI.  Electronically Signed Sanjuana Kava, MD 1/18/20223:25 PM

## 2020-11-13 NOTE — Patient Instructions (Signed)

## 2020-11-15 ENCOUNTER — Ambulatory Visit: Payer: Medicare Other | Admitting: Urology

## 2020-11-15 DIAGNOSIS — R972 Elevated prostate specific antigen [PSA]: Secondary | ICD-10-CM

## 2020-11-20 ENCOUNTER — Other Ambulatory Visit: Payer: Self-pay

## 2020-11-20 ENCOUNTER — Ambulatory Visit (HOSPITAL_COMMUNITY)
Admission: RE | Admit: 2020-11-20 | Discharge: 2020-11-20 | Disposition: A | Discharge status: Home / Self Care | Payer: Medicare Other | Source: Ambulatory Visit | Attending: Orthopaedic Surgery | Admitting: Orthopaedic Surgery

## 2020-11-20 DIAGNOSIS — M5442 Lumbago with sciatica, left side: Secondary | ICD-10-CM | POA: Insufficient documentation

## 2020-11-20 DIAGNOSIS — G8929 Other chronic pain: Secondary | ICD-10-CM | POA: Diagnosis present

## 2020-11-22 ENCOUNTER — Ambulatory Visit (INDEPENDENT_AMBULATORY_CARE_PROVIDER_SITE_OTHER): Payer: Medicare Other | Admitting: Orthopaedic Surgery

## 2020-11-22 ENCOUNTER — Encounter: Payer: Self-pay | Admitting: Orthopaedic Surgery

## 2020-11-22 ENCOUNTER — Other Ambulatory Visit: Payer: Self-pay

## 2020-11-22 VITALS — BP 133/84 | HR 86 | Ht 74.5 in | Wt 159.0 lb

## 2020-11-22 DIAGNOSIS — M5442 Lumbago with sciatica, left side: Secondary | ICD-10-CM | POA: Diagnosis not present

## 2020-11-22 DIAGNOSIS — F17210 Nicotine dependence, cigarettes, uncomplicated: Secondary | ICD-10-CM | POA: Diagnosis not present

## 2020-11-22 DIAGNOSIS — G8929 Other chronic pain: Secondary | ICD-10-CM

## 2020-11-22 MED ORDER — HYDROCODONE-ACETAMINOPHEN 7.5-325 MG PO TABS: ORAL_TABLET | ORAL | 0 refills | Status: DC

## 2020-11-22 NOTE — Progress Notes (Signed)
[name], male DOB:15-Mar-1951, 70 y.o. IRS:[id number]  Chief Complaint  Patient presents with  . Results    Review MRI     HPI  Dillon [name] is a 70 y.o. male who has continued pain of the lower back.  He had MRI which showed:  IMPRESSION: Multilevel degenerative change throughout the lumbar spine is similar to 2016. There is mild progression of central disc protrusion at L4-5.  Moderate to severe foraminal stenosis bilaterally L4-5 and L5-S1 due to spurring.  I have independently reviewed the MRI.     I have explained the findings to him.  I have recommended epidural injections.  He wants to think about this first.  He continues to have pain.  He has more pain with the cold weather.  He has no new trauma, no weakness.  Body mass index is 20.14 kg/m.  ROS  Review of Systems  Respiratory: Positive for shortness of breath. Negative for cough.   Cardiovascular: Negative for chest pain and leg swelling.  Endocrine: Negative for cold intolerance.  Musculoskeletal: Positive for arthralgias and myalgias.  Allergic/Immunologic: Positive for environmental allergies.  All other systems reviewed and are negative.   All other systems reviewed and are negative.  The following is a summary of the past history medically, past history surgically, known current medicines, social history and family history.  This information is gathered electronically by the computer from prior information and documentation.  I review this each visit and have found including this information at this point in the chart is beneficial and informative.    Past Medical History:  Diagnosis Date  . Arthritis    2012  . Asthma    childhood  . COPD (chronic obstructive pulmonary disease) ([place])   . Hyperlipidemia    1990  . Hypertension    1990  . Nicotine addiction   . PAD (peripheral artery disease) ([place]) 2015    Past Surgical History:  Procedure Laterality Date  . KNEE  ARTHROSCOPY     both knees   . LOWER EXTREMITY ANGIOGRAPHY N/A 02/04/2018   Procedure: LOWER EXTREMITY ANGIOGRAPHY;  Surgeon: Conrad Clarence, MD;  Location: [facility];  Service: Cardiovascular;  Laterality: N/A;    Family History  Problem Relation Age of Onset  . Heart attack Mother   . Diabetes Mother   . Hypertension Mother   . Stroke Brother   . Hypertension Sister   . Hypertension Sister   . Hypertension Sister   . Hypertension Brother   . Colon cancer Neg Hx     Social History Social History   Tobacco Use  . Smoking status: Current Some Day Smoker    Years: 48.00    Types: Cigarettes    Start date: 10/16/1969  . Smokeless tobacco: Never Used  Vaping Use  . Vaping Use: Never used  Substance Use Topics  . Alcohol use: Yes    Alcohol/week: 0.0 standard drinks    Comment: occassional , max of 72 oz pwr month  . Drug use: No    Allergies  Allergen Reactions  . Penicillins Hives and Other (See Comments)    Has patient had a PCN reaction causing immediate rash, facial/tongue/throat swelling, SOB or lightheadedness with hypotension: Unknown Has patient had a PCN reaction causing severe rash involving mucus membranes or skin necrosis: No Has patient had a PCN reaction that required hospitalization: Yes Has patient had a PCN reaction occurring within the last 10 years: No If all  of the above answers are "NO", then may proceed with Cephalosporin use.     Current Outpatient Medications  Medication Sig Dispense Refill  . amLODipine (NORVASC) 10 MG tablet TAKE 1 TABLET BY MOUTH EVERY DAY 90 tablet 1  . aspirin EC 81 MG tablet Take 81 mg by mouth daily.    Marland Kitchen atorvastatin (LIPITOR) 80 MG tablet TAKE 1 TABLET BY MOUTH EVERY DAY 90 tablet 3  . BREO ELLIPTA 200-25 MCG/INH AEPB INHALE 1 PUFF BY MOUTH EVERY DAY 60 each 5  . cyclobenzaprine (FLEXERIL) 10 MG tablet Take 1 tablet (10 mg total) by mouth at bedtime. One tablet every night at bedtime as needed for spasm. 30  tablet 0  . ergocalciferol (VITAMIN D2) 1.25 MG (50000 UT) capsule Take 1 capsule (50,000 Units total) by mouth once a week. One capsule once weekly 12 capsule 2  . gabapentin (NEURONTIN) 300 MG capsule TAKE 1 CAPSULE (300 MG TOTAL) BY MOUTH AT BEDTIME. 30 capsule 2  . HYDROcodone-acetaminophen (NORCO) 7.5-325 MG tablet One tablet every six hours as need for pain.  Must last 28 days. 110 tablet 0  . ipratropium (ATROVENT) 0.02 % nebulizer solution Take 2.5 mLs (0.5 mg total) by nebulization every 6 (six) hours as needed for wheezing or shortness of breath. 75 mL 12  . levalbuterol (XOPENEX) 0.63 MG/3ML nebulizer solution Take 3 mLs (0.63 mg total) by nebulization every 8 (eight) hours as needed for wheezing or shortness of breath. 3 mL 12  . meloxicam (MOBIC) 15 MG tablet TAKE 1 TABLET BY MOUTH ONCE DAILY AS NEEDED FOR UNCONTROLLED ARTHRITIS PAIN 90 tablet 0  . predniSONE (STERAPRED UNI-PAK 21 TAB) 5 MG (21) TBPK tablet Take 6 pills first day; 5 pills second day; 4 pills third day; 3 pills fourth day; 2 pills next day and 1 pill last day. 21 tablet 0  . spironolactone (ALDACTONE) 50 MG tablet TAKE 1 TABLET BY MOUTH EVERY DAY 90 tablet 1  . tiZANidine (ZANAFLEX) 4 MG tablet Take 1 tablet (4 mg total) by mouth every 8 (eight) hours as needed for muscle spasms. 60 tablet 3  . UNABLE TO FIND Nebulizer facemask and tubing x 1  DX J44.9 1 each 0  . UNABLE TO FIND Ensure- 2 cans daily  DX protein-calorie malnutrition 60 each 11   No current facility-administered medications for this visit.     Physical Exam  Blood pressure 133/84, pulse 86, height 6' 2.5" (1.892 m), weight 159 lb (72.1 kg).  Constitutional: overall normal hygiene, normal nutrition, well developed, normal grooming, normal body habitus. Assistive device:none  Musculoskeletal: gait and station Limp none, muscle tone and strength are normal, no tremors or atrophy is present.  .  Neurological: coordination overall normal.  Deep  tendon reflex/nerve stretch intact.  Sensation normal.  Cranial nerves II-XII intact.   Skin:   Normal overall no scars, lesions, ulcers or rashes. No psoriasis.  Psychiatric: Alert and oriented x 3.  Recent memory intact, remote memory unclear.  Normal mood and affect. Well groomed.  Good eye contact.  Cardiovascular: overall no swelling, no varicosities, no edema bilaterally, normal temperatures of the legs and arms, no clubbing, cyanosis and good capillary refill.  Lymphatic: palpation is normal.  All other systems reviewed and are negative   The patient has been educated about the nature of the problem(s) and counseled on treatment options.  The patient appeared to understand what I have discussed and is in agreement with it.  Encounter Diagnoses  Name  Primary?  . Chronic midline low back pain with left-sided sciatica Yes  . Cigarette nicotine dependence without complication     PLAN Call if any problems.  Precautions discussed.  Continue current medications.   Return to clinic 2 weeks   I have reviewed the [place] web site prior to prescribing narcotic medicine for this patient.    Electronically Signed Sanjuana Kava, MD 1/27/20228:13 AM

## 2020-11-29 ENCOUNTER — Encounter: Payer: Self-pay | Admitting: Family Medicine

## 2020-11-29 ENCOUNTER — Other Ambulatory Visit: Payer: Self-pay

## 2020-11-29 ENCOUNTER — Telehealth (INDEPENDENT_AMBULATORY_CARE_PROVIDER_SITE_OTHER): Payer: Medicare Other | Admitting: Family Medicine

## 2020-11-29 VITALS — Ht 74.5 in | Wt 159.0 lb

## 2020-11-29 DIAGNOSIS — M1990 Unspecified osteoarthritis, unspecified site: Secondary | ICD-10-CM

## 2020-11-29 DIAGNOSIS — F17200 Nicotine dependence, unspecified, uncomplicated: Secondary | ICD-10-CM

## 2020-11-29 DIAGNOSIS — I10 Essential (primary) hypertension: Secondary | ICD-10-CM | POA: Diagnosis not present

## 2020-11-29 DIAGNOSIS — F17210 Nicotine dependence, cigarettes, uncomplicated: Secondary | ICD-10-CM | POA: Diagnosis not present

## 2020-11-29 DIAGNOSIS — E785 Hyperlipidemia, unspecified: Secondary | ICD-10-CM | POA: Diagnosis not present

## 2020-11-29 DIAGNOSIS — Z1211 Encounter for screening for malignant neoplasm of colon: Secondary | ICD-10-CM

## 2020-11-29 DIAGNOSIS — R972 Elevated prostate specific antigen [PSA]: Secondary | ICD-10-CM | POA: Diagnosis not present

## 2020-11-29 MED ORDER — NICOTINE 7 MG/24HR TD PT24
7.0000 mg | MEDICATED_PATCH | Freq: Every day | TRANSDERMAL | 0 refills | Status: DC
Start: 2020-11-29 — End: 2021-05-17

## 2020-11-29 NOTE — Progress Notes (Signed)
Virtual Visit via Telephone Note  I connected with [name] on 11/29/20 at 10:00 AM EST by telephone and verified that I am speaking with the correct person using two identifiers.  Location: Patient: home Provider: office   I discussed the limitations, risks, security and privacy concerns of performing an evaluation and management service by telephone and the availability of in person appointments. I also discussed with the patient that there may be a patient responsible charge related to this service. The patient expressed understanding and agreed to proceed.   History of Present Illness: F/U chronic problems and address any new or current concerns. Review and update medications and allergies. Review recent lab and radiologic data . Update routine health maintainace. Review an encourage improved health habits to include nutrition, exercise and  sleep .     Observations/Objective: Ht 6' 2.5" (1.892 m)   Wt 159 lb (72.1 kg)   BMI 20.14 kg/m  Good communication with no confusion and intact memory. Alert and oriented x 3 No signs of respiratory distress during speech    Assessment and Plan:  Essential hypertension DASH diet and commitment to daily physical activity for a minimum of 30 minutes discussed and encouraged, as a part of hypertension management. The importance of attaining a healthy weight is also discussed.  BP/Weight 11/29/2020 11/22/2020 11/13/2020 11/06/2020 08/14/2020 07/18/2020 01/07/9701  Systolic BP - 637 858 850 277 412 878  Diastolic BP - 84 87 89 87 82 76  Wt. (Lbs) 159 159 159.25 154.5 153 153.04 152.4  BMI 20.14 20.14 20.17 19.57 19.64 19.39 19.31       Hyperlipidemia LDL goal <70 Hyperlipidemia:Low fat diet discussed and encouraged.   Lipid Panel  Lab Results  Component Value Date   CHOL 199 04/16/2020   HDL 72 04/16/2020   LDLCALC 110 (H) 04/16/2020   TRIG 83 04/16/2020   CHOLHDL 2.8 04/16/2020   Updated lab needed at/ before next  visit. Need to lower fat intake    Elevated PSA Elevated level, needs to see urology  Generalized arthritis Chronic management by Orthopedics  Atherosclerosis of native arteries of extremity with intermittent claudication ([place]) Continue f/u with vascular surgery, encouraged strongly to quit smoking  NICOTINE ADDICTION Asked:confirms currently smokes cigarettes Assess: willing to set a quit date, and  is cutting back, nicoderm patches prescribed Advise: needs to QUIT to reduce risk of cancer, cardio and cerebrovascular disease Assist: counseled for 5 minutes and literature provided Arrange: follow up in 2 to 4 months   Screening for colon cancer cologuard re ordered , and pt encouraged to follow through on test   Follow Up Instructions:    I discussed the assessment and treatment plan with the patient. The patient was provided an opportunity to ask questions and all were answered. The patient agreed with the plan and demonstrated an understanding of the instructions.   The patient was advised to call back or seek an in-person evaluation if the symptoms worsen or if the condition fails to improve as anticipated.  I provided 23  minutes of non-face-to-face time during this encounter.   Tula Nakayama, MD

## 2020-11-29 NOTE — Patient Instructions (Addendum)
F/U in 4 months, call if you need me sooner  QUIT DATE for stopping smoking is TODAY! Please fill script for patches and use for the next 28 days , then no more  Call 1800QUITNOW for support  You are referred to Dr Alyson Ingles re prostate, please follow through as your PSA level is high  Need to send in stool for colouard  Thanks for choosing [facility], we consider it a privelige to serve you.   Steps to Quit Smoking Smoking tobacco is the leading cause of preventable death. It can affect almost every organ in the body. Smoking puts you and people around you at risk for many serious, long-lasting (chronic) diseases. Quitting smoking can be hard, but it is one of the best things that you can do for your health. It is never too late to quit. How do I get ready to quit? When you decide to quit smoking, make a plan to help you succeed. Before you quit:  Pick a date to quit. Set a date within the next 2 weeks to give you time to prepare.  Write down the reasons why you are quitting. Keep this list in places where you will see it often.  Tell your family, friends, and co-workers that you are quitting. Their support is important.  Talk with your doctor about the choices that may help you quit.  Find out if your health insurance will pay for these treatments.  Know the people, places, things, and activities that make you want to smoke (triggers). Avoid them. What first steps can I take to quit smoking?  Throw away all cigarettes at home, at work, and in your car.  Throw away the things that you use when you smoke, such as ashtrays and lighters.  Clean your car. Make sure to empty the ashtray.  Clean your home, including curtains and carpets. What can I do to help me quit smoking? Talk with your doctor about taking medicines and seeing a counselor at the same time. You are more likely to succeed when you do both.  If you are pregnant or breastfeeding, talk with your doctor  about counseling or other ways to quit smoking. Do not take medicine to help you quit smoking unless your doctor tells you to do so. To quit smoking: Quit right away  Quit smoking totally, instead of slowly cutting back on how much you smoke over a period of time.  Go to counseling. You are more likely to quit if you go to counseling sessions regularly. Take medicine You may take medicines to help you quit. Some medicines need a prescription, and some you can buy over-the-counter. Some medicines may contain a drug called nicotine to replace the nicotine in cigarettes. Medicines may:  Help you to stop having the desire to smoke (cravings).  Help to stop the problems that come when you stop smoking (withdrawal symptoms). Your doctor may ask you to use:  Nicotine patches, gum, or lozenges.  Nicotine inhalers or sprays.  Non-nicotine medicine that is taken by mouth. Find resources Find resources and other ways to help you quit smoking and remain smoke-free after you quit. These resources are most helpful when you use them often. They include:  Online chats with a Social worker.  Phone quitlines.  Printed Furniture conservator/restorer.  Support groups or group counseling.  Text messaging programs.  Mobile phone apps. Use apps on your mobile phone or tablet that can help you stick to your quit plan. There are many  free apps for mobile phones and tablets as well as websites. Examples include Quit Guide from the [employer] and smokefree.gov   What things can I do to make it easier to quit?  Talk to your family and friends. Ask them to support and encourage you.  Call a phone quitline (1-800-QUIT-NOW), reach out to support groups, or work with a Social worker.  Ask people who smoke to not smoke around you.  Avoid places that make you want to smoke, such as: ? Bars. ? Parties. ? Smoke-break areas at work.  Spend time with people who do not smoke.  Lower the stress in your life. Stress can make you want  to smoke. Try these things to help your stress: ? Getting regular exercise. ? Doing deep-breathing exercises. ? Doing yoga. ? Meditating. ? Doing a body scan. To do this, close your eyes, focus on one area of your body at a time from head to toe. Notice which parts of your body are tense. Try to relax the muscles in those areas.   How will I feel when I quit smoking? Day 1 to 3 weeks Within the first 24 hours, you may start to have some problems that come from quitting tobacco. These problems are very bad 2-3 days after you quit, but they do not often last for more than 2-3 weeks. You may get these symptoms:  Mood swings.  Feeling restless, nervous, angry, or annoyed.  Trouble concentrating.  Dizziness.  Strong desire for high-sugar foods and nicotine.  Weight gain.  Trouble pooping (constipation).  Feeling like you may vomit (nausea).  Coughing or a sore throat.  Changes in how the medicines that you take for other issues work in your body.  Depression.  Trouble sleeping (insomnia). Week 3 and afterward After the first 2-3 weeks of quitting, you may start to notice more positive results, such as:  Better sense of smell and taste.  Less coughing and sore throat.  Slower heart rate.  Lower blood pressure.  Clearer skin.  Better breathing.  Fewer sick days. Quitting smoking can be hard. Do not give up if you fail the first time. Some people need to try a few times before they succeed. Do your best to stick to your quit plan, and talk with your doctor if you have any questions or concerns. Summary  Smoking tobacco is the leading cause of preventable death. Quitting smoking can be hard, but it is one of the best things that you can do for your health.  When you decide to quit smoking, make a plan to help you succeed.  Quit smoking right away, not slowly over a period of time.  When you start quitting, seek help from your doctor, family, or friends. This  information is not intended to replace advice given to you by your health care provider. Make sure you discuss any questions you have with your health care provider. Document Revised: 07/08/2019 Document Reviewed: 01/01/2019 Elsevier Patient Education  [place].

## 2020-12-02 ENCOUNTER — Encounter: Payer: Self-pay | Admitting: Family Medicine

## 2020-12-02 DIAGNOSIS — Z1211 Encounter for screening for malignant neoplasm of colon: Secondary | ICD-10-CM | POA: Insufficient documentation

## 2020-12-02 NOTE — Assessment & Plan Note (Signed)
Asked:confirms currently smokes cigarettes Assess: willing to set a quit date, and  is cutting back, nicoderm patches prescribed Advise: needs to QUIT to reduce risk of cancer, cardio and cerebrovascular disease Assist: counseled for 5 minutes and literature provided Arrange: follow up in 2 to 4 months

## 2020-12-02 NOTE — Assessment & Plan Note (Signed)
Hyperlipidemia:Low fat diet discussed and encouraged.   Lipid Panel  Lab Results  Component Value Date   CHOL 199 04/16/2020   HDL 72 04/16/2020   LDLCALC 110 (H) 04/16/2020   TRIG 83 04/16/2020   CHOLHDL 2.8 04/16/2020   Updated lab needed at/ before next visit. Need to lower fat intake

## 2020-12-02 NOTE — Assessment & Plan Note (Signed)
Chronic management by Orthopedics

## 2020-12-02 NOTE — Assessment & Plan Note (Signed)
Continue f/u with vascular surgery, encouraged strongly to quit smoking

## 2020-12-02 NOTE — Assessment & Plan Note (Signed)
cologuard re ordered , and pt encouraged to follow through on test

## 2020-12-02 NOTE — Assessment & Plan Note (Signed)
Elevated level, needs to see urology

## 2020-12-02 NOTE — Assessment & Plan Note (Signed)
DASH diet and commitment to daily physical activity for a minimum of 30 minutes discussed and encouraged, as a part of hypertension management. The importance of attaining a healthy weight is also discussed.  BP/Weight 11/29/2020 11/22/2020 11/13/2020 11/06/2020 08/14/2020 07/18/2020 06/05/1750  Systolic BP - 025 852 778 242 353 614  Diastolic BP - 84 87 89 87 82 76  Wt. (Lbs) 159 159 159.25 154.5 153 153.04 152.4  BMI 20.14 20.14 20.17 19.57 19.64 19.39 19.31

## 2020-12-05 ENCOUNTER — Ambulatory Visit: Payer: Medicare Other | Admitting: Family Medicine

## 2020-12-05 ENCOUNTER — Telehealth: Payer: Self-pay | Admitting: Orthopaedic Surgery

## 2020-12-06 ENCOUNTER — Other Ambulatory Visit: Payer: Self-pay

## 2020-12-06 ENCOUNTER — Ambulatory Visit (INDEPENDENT_AMBULATORY_CARE_PROVIDER_SITE_OTHER): Payer: Medicare Other | Admitting: Orthopaedic Surgery

## 2020-12-06 ENCOUNTER — Encounter: Payer: Self-pay | Admitting: Orthopaedic Surgery

## 2020-12-06 VITALS — BP 125/83 | HR 88 | Ht 74.5 in | Wt 156.0 lb

## 2020-12-06 DIAGNOSIS — G8929 Other chronic pain: Secondary | ICD-10-CM | POA: Diagnosis not present

## 2020-12-06 DIAGNOSIS — F17210 Nicotine dependence, cigarettes, uncomplicated: Secondary | ICD-10-CM | POA: Diagnosis not present

## 2020-12-06 DIAGNOSIS — M5442 Lumbago with sciatica, left side: Secondary | ICD-10-CM

## 2020-12-06 NOTE — Progress Notes (Signed)
[name], male DOB:1951/09/10, 70 y.o. OEV:[id number]  Chief Complaint  Patient presents with  . Back Pain    HPI  Dillon [name] is a 70 y.o. male who has chronic lower back pain.  He is a little better, less pain, but he still has it.  He is better as the weather is warmer.  He has obtained a back brace to use and it helps.  He is taking his Flexeril which helps.  He has no numbness, no new trauma.   Body mass index is 19.76 kg/m.  ROS  Review of Systems  Respiratory: Positive for shortness of breath. Negative for cough.   Cardiovascular: Negative for chest pain and leg swelling.  Endocrine: Negative for cold intolerance.  Musculoskeletal: Positive for arthralgias and myalgias.  Allergic/Immunologic: Positive for environmental allergies.  All other systems reviewed and are negative.   All other systems reviewed and are negative.  The following is a summary of the past history medically, past history surgically, known current medicines, social history and family history.  This information is gathered electronically by the computer from prior information and documentation.  I review this each visit and have found including this information at this point in the chart is beneficial and informative.    Past Medical History:  Diagnosis Date  . Arthritis    2012  . Asthma    childhood  . COPD (chronic obstructive pulmonary disease) ([place])   . Hyperlipidemia    1990  . Hypertension    1990  . Nicotine addiction   . PAD (peripheral artery disease) ([place]) 2015    Past Surgical History:  Procedure Laterality Date  . KNEE ARTHROSCOPY     both knees   . LOWER EXTREMITY ANGIOGRAPHY N/A 02/04/2018   Procedure: LOWER EXTREMITY ANGIOGRAPHY;  Surgeon: Conrad Fulton, MD;  Location: [facility];  Service: Cardiovascular;  Laterality: N/A;    Family History  Problem Relation Age of Onset  . Heart attack Mother   . Diabetes Mother   . Hypertension Mother   .  Stroke Brother   . Hypertension Sister   . Hypertension Sister   . Hypertension Sister   . Hypertension Brother   . Colon cancer Neg Hx     Social History Social History   Tobacco Use  . Smoking status: Current Some Day Smoker    Years: 48.00    Types: Cigarettes    Start date: 10/16/1969  . Smokeless tobacco: Never Used  Vaping Use  . Vaping Use: Never used  Substance Use Topics  . Alcohol use: Yes    Alcohol/week: 0.0 standard drinks    Comment: occassional , max of 72 oz pwr month  . Drug use: No    Allergies  Allergen Reactions  . Penicillins Hives and Other (See Comments)    Has patient had a PCN reaction causing immediate rash, facial/tongue/throat swelling, SOB or lightheadedness with hypotension: Unknown Has patient had a PCN reaction causing severe rash involving mucus membranes or skin necrosis: No Has patient had a PCN reaction that required hospitalization: Yes Has patient had a PCN reaction occurring within the last 10 years: No If all of the above answers are "NO", then may proceed with Cephalosporin use.     Current Outpatient Medications  Medication Sig Dispense Refill  . amLODipine (NORVASC) 10 MG tablet TAKE 1 TABLET BY MOUTH EVERY DAY 90 tablet 1  . aspirin EC 81 MG tablet Take 81 mg by mouth  daily.    . atorvastatin (LIPITOR) 80 MG tablet TAKE 1 TABLET BY MOUTH EVERY DAY 90 tablet 3  . BREO ELLIPTA 200-25 MCG/INH AEPB INHALE 1 PUFF BY MOUTH EVERY DAY 60 each 5  . cyclobenzaprine (FLEXERIL) 10 MG tablet TAKE 1 TABLET (10 MG TOTAL) BY MOUTH AT BEDTIME AS NEEDED FOR SPASM. 30 tablet 3  . ergocalciferol (VITAMIN D2) 1.25 MG (50000 UT) capsule Take 1 capsule (50,000 Units total) by mouth once a week. One capsule once weekly 12 capsule 2  . gabapentin (NEURONTIN) 300 MG capsule TAKE 1 CAPSULE (300 MG TOTAL) BY MOUTH AT BEDTIME. 30 capsule 2  . HYDROcodone-acetaminophen (NORCO) 7.5-325 MG tablet One tablet every six hours as need for pain.  Must last 28  days. 110 tablet 0  . ipratropium (ATROVENT) 0.02 % nebulizer solution Take 2.5 mLs (0.5 mg total) by nebulization every 6 (six) hours as needed for wheezing or shortness of breath. 75 mL 12  . levalbuterol (XOPENEX) 0.63 MG/3ML nebulizer solution Take 3 mLs (0.63 mg total) by nebulization every 8 (eight) hours as needed for wheezing or shortness of breath. 3 mL 12  . meloxicam (MOBIC) 15 MG tablet TAKE 1 TABLET BY MOUTH ONCE DAILY AS NEEDED FOR UNCONTROLLED ARTHRITIS PAIN 90 tablet 0  . nicotine (NICODERM CQ) 7 mg/24hr patch Place 1 patch (7 mg total) onto the skin daily. 28 patch 0  . spironolactone (ALDACTONE) 50 MG tablet TAKE 1 TABLET BY MOUTH EVERY DAY 90 tablet 1  . UNABLE TO FIND Nebulizer facemask and tubing x 1  DX J44.9 1 each 0  . UNABLE TO FIND Ensure- 2 cans daily  DX protein-calorie malnutrition 60 each 11  . amLODipine (NORVASC) 10 MG tablet TAKE 1 TABLET BY MOUTH EVERY DAY 90 tablet 1  . predniSONE (STERAPRED UNI-PAK 21 TAB) 5 MG (21) TBPK tablet Take 6 pills first day; 5 pills second day; 4 pills third day; 3 pills fourth day; 2 pills next day and 1 pill last day. (Patient not taking: Reported on 12/06/2020) 21 tablet 0   No current facility-administered medications for this visit.     Physical Exam  Blood pressure 125/83, pulse 88, height 6' 2.5" (1.892 m), weight 156 lb (70.8 kg).  Constitutional: overall normal hygiene, normal nutrition, well developed, normal grooming, normal body habitus. Assistive device:none  Musculoskeletal: gait and station Limp none, muscle tone and strength are normal, no tremors or atrophy is present.  .  Neurological: coordination overall normal.  Deep tendon reflex/nerve stretch intact.  Sensation normal.  Cranial nerves II-XII intact.   Skin:   Normal overall no scars, lesions, ulcers or rashes. No psoriasis.  Psychiatric: Alert and oriented x 3.  Recent memory intact, remote memory unclear.  Normal mood and affect. Well groomed.  Good  eye contact.  Cardiovascular: overall no swelling, no varicosities, no edema bilaterally, normal temperatures of the legs and arms, no clubbing, cyanosis and good capillary refill.  Lymphatic: palpation is normal.  Lower back is tender but has better motion.  NV intact. SLR negative.  All other systems reviewed and are negative   The patient has been educated about the nature of the problem(s) and counseled on treatment options.  The patient appeared to understand what I have discussed and is in agreement with it.  Encounter Diagnoses  Name Primary?  . Chronic midline low back pain with left-sided sciatica Yes  . Cigarette nicotine dependence without complication     PLAN Call if any problems.  Precautions discussed.  Continue current medications.   Return to clinic 1 month   Electronically Signed Sanjuana Kava, MD 2/10/20228:40 AM

## 2020-12-07 ENCOUNTER — Telehealth: Payer: Self-pay

## 2020-12-07 NOTE — Telephone Encounter (Signed)
Patient needing refills on breathing meds for his [facility] [place] please ph# [phone]

## 2020-12-10 ENCOUNTER — Other Ambulatory Visit: Payer: Self-pay

## 2020-12-10 MED ORDER — LEVALBUTEROL HCL 0.63 MG/3ML IN NEBU: 0.6300 mg | INHALATION_SOLUTION | Freq: Three times a day (TID) | RESPIRATORY_TRACT | 12 refills | Status: DC | PRN

## 2020-12-10 NOTE — Telephone Encounter (Signed)
Sent in xopenex for neb which was all I seen on [facility]

## 2020-12-13 ENCOUNTER — Other Ambulatory Visit: Payer: Self-pay

## 2020-12-13 MED ORDER — LEVALBUTEROL HCL 0.63 MG/3ML IN NEBU: 0.6300 mg | INHALATION_SOLUTION | Freq: Three times a day (TID) | RESPIRATORY_TRACT | 12 refills | Status: DC | PRN

## 2020-12-17 ENCOUNTER — Ambulatory Visit (HOSPITAL_COMMUNITY): Payer: Medicare Other

## 2020-12-17 ENCOUNTER — Ambulatory Visit: Payer: Medicare Other

## 2020-12-19 ENCOUNTER — Telehealth: Payer: Self-pay

## 2020-12-19 NOTE — Telephone Encounter (Signed)
Patient request a new prescription for Hydrocodone-Acetaminophen 7.5/325 mg  Qty 110 Tablets   PATIENT USES [facility]

## 2020-12-20 ENCOUNTER — Other Ambulatory Visit: Payer: Self-pay | Admitting: Orthopaedic Surgery

## 2020-12-20 MED ORDER — HYDROCODONE-ACETAMINOPHEN 7.5-325 MG PO TABS: ORAL_TABLET | ORAL | 0 refills | Status: DC

## 2020-12-20 NOTE — Addendum Note (Signed)
Addended by: Willette Pa on: 12/20/2020 08:02 AM   Modules accepted: Orders

## 2020-12-27 ENCOUNTER — Other Ambulatory Visit: Payer: Self-pay | Admitting: Family Medicine

## 2021-01-03 ENCOUNTER — Other Ambulatory Visit: Payer: Self-pay

## 2021-01-03 ENCOUNTER — Encounter: Payer: Self-pay | Admitting: Orthopaedic Surgery

## 2021-01-03 ENCOUNTER — Ambulatory Visit (INDEPENDENT_AMBULATORY_CARE_PROVIDER_SITE_OTHER): Payer: Medicare Other | Admitting: Orthopaedic Surgery

## 2021-01-03 VITALS — BP 144/85 | HR 86 | Ht 74.5 in | Wt 157.4 lb

## 2021-01-03 DIAGNOSIS — M5442 Lumbago with sciatica, left side: Secondary | ICD-10-CM | POA: Diagnosis not present

## 2021-01-03 DIAGNOSIS — F17210 Nicotine dependence, cigarettes, uncomplicated: Secondary | ICD-10-CM

## 2021-01-03 DIAGNOSIS — G8929 Other chronic pain: Secondary | ICD-10-CM

## 2021-01-03 NOTE — Progress Notes (Signed)
[name], male DOB:1951/03/17, 70 y.o. OHY:[id number]  Chief Complaint  Patient presents with  . Back Pain    Having good days and bad days    HPI  Dillon [name] is a 70 y.o. male who has lower back pain. He has good and bad days, more bad days recently with the colder weather we have had and all the rain.  He is taking his medicine and doing his exercises.  He still declines epidural.   Body mass index is 19.94 kg/m.  ROS  Review of Systems  Respiratory: Positive for shortness of breath. Negative for cough.   Cardiovascular: Negative for chest pain and leg swelling.  Endocrine: Negative for cold intolerance.  Musculoskeletal: Positive for arthralgias and myalgias.  Allergic/Immunologic: Positive for environmental allergies.  All other systems reviewed and are negative.   All other systems reviewed and are negative.  The following is a summary of the past history medically, past history surgically, known current medicines, social history and family history.  This information is gathered electronically by the computer from prior information and documentation.  I review this each visit and have found including this information at this point in the chart is beneficial and informative.    Past Medical History:  Diagnosis Date  . Arthritis    2012  . Asthma    childhood  . COPD (chronic obstructive pulmonary disease) ([place])   . Hyperlipidemia    1990  . Hypertension    1990  . Nicotine addiction   . PAD (peripheral artery disease) ([place]) 2015    Past Surgical History:  Procedure Laterality Date  . KNEE ARTHROSCOPY     both knees   . LOWER EXTREMITY ANGIOGRAPHY N/A 02/04/2018   Procedure: LOWER EXTREMITY ANGIOGRAPHY;  Surgeon: Conrad [place], MD;  Location: [facility];  Service: Cardiovascular;  Laterality: N/A;    Family History  Problem Relation Age of Onset  . Heart attack Mother   . Diabetes Mother   . Hypertension Mother   . Stroke Brother    . Hypertension Sister   . Hypertension Sister   . Hypertension Sister   . Hypertension Brother   . Colon cancer Neg Hx     Social History Social History   Tobacco Use  . Smoking status: Current Some Day Smoker    Years: 48.00    Types: Cigarettes    Start date: 10/16/1969  . Smokeless tobacco: Never Used  Vaping Use  . Vaping Use: Never used  Substance Use Topics  . Alcohol use: Yes    Alcohol/week: 0.0 standard drinks    Comment: occassional , max of 72 oz pwr month  . Drug use: No    Allergies  Allergen Reactions  . Penicillins Hives and Other (See Comments)    Has patient had a PCN reaction causing immediate rash, facial/tongue/throat swelling, SOB or lightheadedness with hypotension: Unknown Has patient had a PCN reaction causing severe rash involving mucus membranes or skin necrosis: No Has patient had a PCN reaction that required hospitalization: Yes Has patient had a PCN reaction occurring within the last 10 years: No If all of the above answers are "NO", then may proceed with Cephalosporin use.     Current Outpatient Medications  Medication Sig Dispense Refill  . amLODipine (NORVASC) 10 MG tablet TAKE 1 TABLET BY MOUTH EVERY DAY 90 tablet 1  . aspirin EC 81 MG tablet Take 81 mg by mouth daily.    Marland Kitchen atorvastatin (  LIPITOR) 80 MG tablet TAKE 1 TABLET BY MOUTH EVERY DAY 90 tablet 3  . BREO ELLIPTA 200-25 MCG/INH AEPB INHALE 1 PUFF BY MOUTH EVERY DAY 60 each 5  . cyclobenzaprine (FLEXERIL) 10 MG tablet TAKE 1 TABLET (10 MG TOTAL) BY MOUTH AT BEDTIME AS NEEDED FOR SPASM. 30 tablet 3  . gabapentin (NEURONTIN) 300 MG capsule TAKE 1 CAPSULE (300 MG TOTAL) BY MOUTH AT BEDTIME. 30 capsule 2  . HYDROcodone-acetaminophen (NORCO) 7.5-325 MG tablet One tablet every six hours as need for pain.  Must last 28 days. 110 tablet 0  . ipratropium (ATROVENT) 0.02 % nebulizer solution Take 2.5 mLs (0.5 mg total) by nebulization every 6 (six) hours as needed for wheezing or shortness  of breath. 75 mL 12  . levalbuterol (XOPENEX) 0.63 MG/3ML nebulizer solution Take 3 mLs (0.63 mg total) by nebulization every 8 (eight) hours as needed for wheezing or shortness of breath. 3 mL 12  . meloxicam (MOBIC) 15 MG tablet TAKE 1 TABLET BY MOUTH ONCE DAILY AS NEEDED FOR UNCONTROLLED ARTHRITIS PAIN 90 tablet 0  . nicotine (NICODERM CQ) 7 mg/24hr patch Place 1 patch (7 mg total) onto the skin daily. 28 patch 0  . predniSONE (STERAPRED UNI-PAK 21 TAB) 5 MG (21) TBPK tablet Take 6 pills first day; 5 pills second day; 4 pills third day; 3 pills fourth day; 2 pills next day and 1 pill last day. 21 tablet 0  . spironolactone (ALDACTONE) 50 MG tablet TAKE 1 TABLET BY MOUTH EVERY DAY 90 tablet 1  . UNABLE TO FIND Nebulizer facemask and tubing x 1  DX J44.9 1 each 0  . UNABLE TO FIND Ensure- 2 cans daily  DX protein-calorie malnutrition 60 each 11  . Vitamin D, Ergocalciferol, (DRISDOL) 1.25 MG (50000 UNIT) CAPS capsule TAKE 1 CAPSULE (50,000 UNITS TOTAL) BY MOUTH ONCE A WEEK. ONE CAPSULE ONCE WEEKLY 12 capsule 2   No current facility-administered medications for this visit.     Physical Exam  Blood pressure (!) 144/85, pulse 86, height 6' 2.5" (1.892 m), weight 157 lb 6 oz (71.4 kg).  Constitutional: overall normal hygiene, normal nutrition, well developed, normal grooming, normal body habitus. Assistive device:none  Musculoskeletal: gait and station Limp none, muscle tone and strength are normal, no tremors or atrophy is present.  .  Neurological: coordination overall normal.  Deep tendon reflex/nerve stretch intact.  Sensation normal.  Cranial nerves II-XII intact.   Skin:   Normal overall no scars, lesions, ulcers or rashes. No psoriasis.  Psychiatric: Alert and oriented x 3.  Recent memory intact, remote memory unclear.  Normal mood and affect. Well groomed.  Good eye contact.  Cardiovascular: overall no swelling, no varicosities, no edema bilaterally, normal temperatures of the  legs and arms, no clubbing, cyanosis and good capillary refill.  Spine/Pelvis examination:  Inspection:  Overall, sacoiliac joint benign and hips nontender; without crepitus or defects.   Thoracic spine inspection: Alignment normal without kyphosis present   Lumbar spine inspection:  Alignment  with normal lumbar lordosis, without scoliosis apparent.   Thoracic spine palpation:  without tenderness of spinal processes   Lumbar spine palpation: without tenderness of lumbar area; without tightness of lumbar muscles    Range of Motion:   Lumbar flexion, forward flexion is normal without pain or tenderness    Lumbar extension is full without pain or tenderness   Left lateral bend is normal without pain or tenderness   Right lateral bend is normal without pain or  tenderness   Straight leg raising is normal  Strength & tone: normal   Stability overall normal stability  Lymphatic: palpation is normal.  All other systems reviewed and are negative   The patient has been educated about the nature of the problem(s) and counseled on treatment options.  The patient appeared to understand what I have discussed and is in agreement with it.  Encounter Diagnoses  Name Primary?  . Chronic midline low back pain with left-sided sciatica Yes  . Cigarette nicotine dependence without complication     PLAN Call if any problems.  Precautions discussed.  Continue current medications.   Return to clinic 6 weeks   Electronically Signed Sanjuana Kava, MD 3/10/20228:16 AM

## 2021-01-03 NOTE — Patient Instructions (Signed)

## 2021-01-14 ENCOUNTER — Other Ambulatory Visit: Payer: Medicare Other

## 2021-01-14 ENCOUNTER — Telehealth: Payer: Self-pay | Admitting: Orthopaedic Surgery

## 2021-01-15 MED ORDER — HYDROCODONE-ACETAMINOPHEN 7.5-325 MG PO TABS: ORAL_TABLET | ORAL | 0 refills | Status: DC

## 2021-01-16 ENCOUNTER — Ambulatory Visit: Payer: Medicare Other | Admitting: Urology

## 2021-01-17 ENCOUNTER — Ambulatory Visit (HOSPITAL_COMMUNITY): Payer: Medicare Other

## 2021-01-17 ENCOUNTER — Ambulatory Visit: Payer: Medicare Other

## 2021-01-21 ENCOUNTER — Ambulatory Visit: Payer: Medicare Other | Admitting: Urology

## 2021-02-12 ENCOUNTER — Telehealth: Payer: Self-pay | Admitting: Orthopaedic Surgery

## 2021-02-12 MED ORDER — HYDROCODONE-ACETAMINOPHEN 7.5-325 MG PO TABS: ORAL_TABLET | ORAL | 0 refills | Status: DC

## 2021-02-14 ENCOUNTER — Encounter: Payer: Self-pay | Admitting: Orthopaedic Surgery

## 2021-02-14 ENCOUNTER — Ambulatory Visit (INDEPENDENT_AMBULATORY_CARE_PROVIDER_SITE_OTHER): Payer: Medicare Other | Admitting: Orthopaedic Surgery

## 2021-02-14 ENCOUNTER — Other Ambulatory Visit: Payer: Self-pay

## 2021-02-14 VITALS — BP 163/90 | HR 73 | Ht 74.5 in | Wt 157.0 lb

## 2021-02-14 DIAGNOSIS — G8929 Other chronic pain: Secondary | ICD-10-CM | POA: Diagnosis not present

## 2021-02-14 DIAGNOSIS — M5442 Lumbago with sciatica, left side: Secondary | ICD-10-CM

## 2021-02-14 DIAGNOSIS — F17210 Nicotine dependence, cigarettes, uncomplicated: Secondary | ICD-10-CM

## 2021-02-14 NOTE — Progress Notes (Signed)
Patient IO:Dillon [name], male DOB:19-Oct-1951, 70 y.o. HGD:[id number]  Chief Complaint  Patient presents with  . Back Pain    I still good days and bad days.    HPI  Dillon [name] is a 70 y.o. male who has chronic back pain. He has more bad days than good days.  He is still reluctant to have epidural.  He has no new trauma.  He is taking his medicine.   Body mass index is 19.89 kg/m.  ROS  Review of Systems  Respiratory: Positive for shortness of breath. Negative for cough.   Cardiovascular: Negative for chest pain and leg swelling.  Endocrine: Negative for cold intolerance.  Musculoskeletal: Positive for arthralgias and myalgias.  Allergic/Immunologic: Positive for environmental allergies.  All other systems reviewed and are negative.   All other systems reviewed and are negative.  The following is a summary of the past history medically, past history surgically, known current medicines, social history and family history.  This information is gathered electronically by the computer from prior information and documentation.  I review this each visit and have found including this information at this point in the chart is beneficial and informative.    Past Medical History:  Diagnosis Date  . Arthritis    2012  . Asthma    childhood  . COPD (chronic obstructive pulmonary disease) ([place])   . Hyperlipidemia    1990  . Hypertension    1990  . Nicotine addiction   . PAD (peripheral artery disease) ([place]) 2015    Past Surgical History:  Procedure Laterality Date  . KNEE ARTHROSCOPY     both knees   . LOWER EXTREMITY ANGIOGRAPHY N/A 02/04/2018   Procedure: LOWER EXTREMITY ANGIOGRAPHY;  Surgeon: Conrad , MD;  Location: [facility];  Service: Cardiovascular;  Laterality: N/A;    Family History  Problem Relation Age of Onset  . Heart attack Mother   . Diabetes Mother   . Hypertension Mother   . Stroke Brother   . Hypertension Sister   . Hypertension Sister    . Hypertension Sister   . Hypertension Brother   . Colon cancer Neg Hx     Social History Social History   Tobacco Use  . Smoking status: Current Some Day Smoker    Years: 48.00    Types: Cigarettes    Start date: 10/16/1969  . Smokeless tobacco: Never Used  Vaping Use  . Vaping Use: Never used  Substance Use Topics  . Alcohol use: Yes    Alcohol/week: 0.0 standard drinks    Comment: occassional , max of 72 oz pwr month  . Drug use: No    Allergies  Allergen Reactions  . Penicillins Hives and Other (See Comments)    Has patient had a PCN reaction causing immediate rash, facial/tongue/throat swelling, SOB or lightheadedness with hypotension: Unknown Has patient had a PCN reaction causing severe rash involving mucus membranes or skin necrosis: No Has patient had a PCN reaction that required hospitalization: Yes Has patient had a PCN reaction occurring within the last 10 years: No If all of the above answers are "NO", then may proceed with Cephalosporin use.     Current Outpatient Medications  Medication Sig Dispense Refill  . amLODipine (NORVASC) 10 MG tablet TAKE 1 TABLET BY MOUTH EVERY DAY 90 tablet 1  . aspirin EC 81 MG tablet Take 81 mg by mouth daily.    Marland Kitchen atorvastatin (LIPITOR) 80 MG tablet TAKE 1 TABLET  BY MOUTH EVERY DAY 90 tablet 3  . BREO ELLIPTA 200-25 MCG/INH AEPB INHALE 1 PUFF BY MOUTH EVERY DAY 60 each 5  . cyclobenzaprine (FLEXERIL) 10 MG tablet TAKE 1 TABLET (10 MG TOTAL) BY MOUTH AT BEDTIME AS NEEDED FOR SPASM. 30 tablet 3  . gabapentin (NEURONTIN) 300 MG capsule TAKE 1 CAPSULE (300 MG TOTAL) BY MOUTH AT BEDTIME. 30 capsule 2  . HYDROcodone-acetaminophen (NORCO) 7.5-325 MG tablet One tablet every six hours as need for pain.  Must last 28 days. 110 tablet 0  . ipratropium (ATROVENT) 0.02 % nebulizer solution Take 2.5 mLs (0.5 mg total) by nebulization every 6 (six) hours as needed for wheezing or shortness of breath. 75 mL 12  . levalbuterol (XOPENEX) 0.63  MG/3ML nebulizer solution Take 3 mLs (0.63 mg total) by nebulization every 8 (eight) hours as needed for wheezing or shortness of breath. 3 mL 12  . meloxicam (MOBIC) 15 MG tablet TAKE 1 TABLET BY MOUTH ONCE DAILY AS NEEDED FOR UNCONTROLLED ARTHRITIS PAIN 90 tablet 0  . nicotine (NICODERM CQ) 7 mg/24hr patch Place 1 patch (7 mg total) onto the skin daily. 28 patch 0  . predniSONE (STERAPRED UNI-PAK 21 TAB) 5 MG (21) TBPK tablet Take 6 pills first day; 5 pills second day; 4 pills third day; 3 pills fourth day; 2 pills next day and 1 pill last day. 21 tablet 0  . spironolactone (ALDACTONE) 50 MG tablet TAKE 1 TABLET BY MOUTH EVERY DAY 90 tablet 1  . UNABLE TO FIND Nebulizer facemask and tubing x 1  DX J44.9 1 each 0  . UNABLE TO FIND Ensure- 2 cans daily  DX protein-calorie malnutrition 60 each 11  . Vitamin D, Ergocalciferol, (DRISDOL) 1.25 MG (50000 UNIT) CAPS capsule TAKE 1 CAPSULE (50,000 UNITS TOTAL) BY MOUTH ONCE A WEEK. ONE CAPSULE ONCE WEEKLY 12 capsule 2   No current facility-administered medications for this visit.     Physical Exam  Blood pressure (!) 163/90, pulse 73, height 6' 2.5" (1.892 m), weight 157 lb (71.2 kg).  Constitutional: overall normal hygiene, normal nutrition, well developed, normal grooming, normal body habitus. Assistive device:none  Musculoskeletal: gait and station Limp none, muscle tone and strength are normal, no tremors or atrophy is present.  .  Neurological: coordination overall normal.  Deep tendon reflex/nerve stretch intact.  Sensation normal.  Cranial nerves II-XII intact.   Skin:   Normal overall no scars, lesions, ulcers or rashes. No psoriasis.  Psychiatric: Alert and oriented x 3.  Recent memory intact, remote memory unclear.  Normal mood and affect. Well groomed.  Good eye contact.  Cardiovascular: overall no swelling, no varicosities, no edema bilaterally, normal temperatures of the legs and arms, no clubbing, cyanosis and good capillary  refill.  Lymphatic: palpation is normal.  Spine/Pelvis examination:  Inspection:  Overall, sacoiliac joint benign and hips nontender; without crepitus or defects.   Thoracic spine inspection: Alignment normal without kyphosis present   Lumbar spine inspection:  Alignment  with normal lumbar lordosis, without scoliosis apparent.   Thoracic spine palpation:  without tenderness of spinal processes   Lumbar spine palpation: without tenderness of lumbar area; without tightness of lumbar muscles    Range of Motion:   Lumbar flexion, forward flexion is normal without pain or tenderness    Lumbar extension is full without pain or tenderness   Left lateral bend is normal without pain or tenderness   Right lateral bend is normal without pain or tenderness   Straight  leg raising is normal  Strength & tone: normal   Stability overall normal stability  All other systems reviewed and are negative   The patient has been educated about the nature of the problem(s) and counseled on treatment options.  The patient appeared to understand what I have discussed and is in agreement with it.  Encounter Diagnoses  Name Primary?  . Chronic midline low back pain with left-sided sciatica Yes  . Cigarette nicotine dependence without complication     PLAN Call if any problems.  Precautions discussed.  Continue current medications.   Return to clinic 2 months   Electronically Signed Sanjuana Kava, MD 4/21/20228:08 AM

## 2021-02-14 NOTE — Patient Instructions (Signed)

## 2021-02-20 ENCOUNTER — Ambulatory Visit (INDEPENDENT_AMBULATORY_CARE_PROVIDER_SITE_OTHER)
Admission: RE | Admit: 2021-02-20 | Discharge: 2021-02-20 | Disposition: A | Discharge status: Home / Self Care | Payer: Medicare Other | Source: Ambulatory Visit | Attending: Vascular Surgery | Admitting: Vascular Surgery

## 2021-02-20 ENCOUNTER — Ambulatory Visit (HOSPITAL_COMMUNITY)
Admission: RE | Admit: 2021-02-20 | Discharge: 2021-02-20 | Disposition: A | Discharge status: Home / Self Care | Payer: Medicare Other | Source: Ambulatory Visit | Attending: Vascular Surgery | Admitting: Vascular Surgery

## 2021-02-20 ENCOUNTER — Ambulatory Visit (INDEPENDENT_AMBULATORY_CARE_PROVIDER_SITE_OTHER): Payer: Medicare Other | Admitting: Physician Assistant

## 2021-02-20 ENCOUNTER — Other Ambulatory Visit: Payer: Self-pay

## 2021-02-20 VITALS — BP 132/83 | HR 82 | Temp 98.0°F | Resp 20 | Ht 74.5 in | Wt 153.8 lb

## 2021-02-20 DIAGNOSIS — I70211 Atherosclerosis of native arteries of extremities with intermittent claudication, right leg: Secondary | ICD-10-CM | POA: Diagnosis not present

## 2021-02-20 DIAGNOSIS — I714 Abdominal aortic aneurysm, without rupture, unspecified: Secondary | ICD-10-CM

## 2021-02-20 DIAGNOSIS — I70213 Atherosclerosis of native arteries of extremities with intermittent claudication, bilateral legs: Secondary | ICD-10-CM

## 2021-02-20 NOTE — Progress Notes (Signed)
[facility] [place] HISTORY AND PHYSICAL   History of Present Illness:  Patient is a 70 y.o. year old male who presents for evaluation of abdominal aortic aneurysm and B LE PAD.  He is here for surveillance Duplex of AAA/Iliacs and ABI's.    He had no complaints of abdominal pain, other than occasional right LE tenderness/knott. He states his pain occurs approximately three times a week. The pain occurs with activity including brushing his teeth and tongue. Pain is associated with movement. Pain is alleviated by massage.   He does occasionally have calf pain that is not consistent with true claudication.  He walks up to 1/2 mile 3-4 times a week.  He denise non healing wounds or rest pain.  The pt is on a statin for cholesterol management.  The pt is on a daily aspirin.   Other AC:  none The pt is on CCB for hypertension.   The pt is not diabetic.   Tobacco hx: Approximately 1/4 pack of cigarettes daily  Past Medical History:  Diagnosis Date  . Arthritis    2012  . Asthma    childhood  . COPD (chronic obstructive pulmonary disease) ([place])   . Hyperlipidemia    1990  . Hypertension    1990  . Nicotine addiction   . PAD (peripheral artery disease) ([place]) 2015    Past Surgical History:  Procedure Laterality Date  . KNEE ARTHROSCOPY     both knees   . LOWER EXTREMITY ANGIOGRAPHY N/A 02/04/2018   Procedure: LOWER EXTREMITY ANGIOGRAPHY;  Surgeon: Conrad Artesian, MD;  Location: [facility];  Service: Cardiovascular;  Laterality: N/A;     Social History Social History   Tobacco Use  . Smoking status: Current Some Day Smoker    Years: 48.00    Types: Cigarettes    Start date: 10/16/1969  . Smokeless tobacco: Never Used  Vaping Use  . Vaping Use: Never used  Substance Use Topics  . Alcohol use: Yes    Alcohol/week: 0.0 standard drinks    Comment: occassional , max of 72 oz pwr month  . Drug use: No    Family History Family History  Problem  Relation Age of Onset  . Heart attack Mother   . Diabetes Mother   . Hypertension Mother   . Stroke Brother   . Hypertension Sister   . Hypertension Sister   . Hypertension Sister   . Hypertension Brother   . Colon cancer Neg Hx     Allergies  Allergies  Allergen Reactions  . Penicillins Hives and Other (See Comments)    Has patient had a PCN reaction causing immediate rash, facial/tongue/throat swelling, SOB or lightheadedness with hypotension: Unknown Has patient had a PCN reaction causing severe rash involving mucus membranes or skin necrosis: No Has patient had a PCN reaction that required hospitalization: Yes Has patient had a PCN reaction occurring within the last 10 years: No If all of the above answers are "NO", then may proceed with Cephalosporin use.      Current Outpatient Medications  Medication Sig Dispense Refill  . ALPHAGAN P 0.1 % SOLN SMARTSIG:1 Drop(s) In Eye(s) Morning-Evening    . amLODipine (NORVASC) 10 MG tablet TAKE 1 TABLET BY MOUTH EVERY DAY 90 tablet 1  . aspirin EC 81 MG tablet Take 81 mg by mouth daily.    Marland Kitchen atorvastatin (LIPITOR) 80 MG tablet TAKE 1 TABLET BY MOUTH EVERY DAY 90 tablet 3  .  BREO ELLIPTA 200-25 MCG/INH AEPB INHALE 1 PUFF BY MOUTH EVERY DAY 60 each 5  . cyclobenzaprine (FLEXERIL) 10 MG tablet TAKE 1 TABLET (10 MG TOTAL) BY MOUTH AT BEDTIME AS NEEDED FOR SPASM. 30 tablet 3  . gabapentin (NEURONTIN) 300 MG capsule TAKE 1 CAPSULE (300 MG TOTAL) BY MOUTH AT BEDTIME. 30 capsule 2  . HYDROcodone-acetaminophen (NORCO) 7.5-325 MG tablet One tablet every six hours as need for pain.  Must last 28 days. 110 tablet 0  . ipratropium (ATROVENT) 0.02 % nebulizer solution Take 2.5 mLs (0.5 mg total) by nebulization every 6 (six) hours as needed for wheezing or shortness of breath. 75 mL 12  . latanoprost (XALATAN) 0.005 % ophthalmic solution 1 drop daily.    Marland Kitchen levalbuterol (XOPENEX) 0.63 MG/3ML nebulizer solution Take 3 mLs (0.63 mg total) by  nebulization every 8 (eight) hours as needed for wheezing or shortness of breath. 3 mL 12  . meloxicam (MOBIC) 15 MG tablet TAKE 1 TABLET BY MOUTH ONCE DAILY AS NEEDED FOR UNCONTROLLED ARTHRITIS PAIN 90 tablet 0  . predniSONE (STERAPRED UNI-PAK 21 TAB) 5 MG (21) TBPK tablet Take 6 pills first day; 5 pills second day; 4 pills third day; 3 pills fourth day; 2 pills next day and 1 pill last day. 21 tablet 0  . spironolactone (ALDACTONE) 50 MG tablet TAKE 1 TABLET BY MOUTH EVERY DAY 90 tablet 1  . UNABLE TO FIND Nebulizer facemask and tubing x 1  DX J44.9 1 each 0  . UNABLE TO FIND Ensure- 2 cans daily  DX protein-calorie malnutrition 60 each 11  . Vitamin D, Ergocalciferol, (DRISDOL) 1.25 MG (50000 UNIT) CAPS capsule TAKE 1 CAPSULE (50,000 UNITS TOTAL) BY MOUTH ONCE A WEEK. ONE CAPSULE ONCE WEEKLY 12 capsule 2  . nicotine (NICODERM CQ) 7 mg/24hr patch Place 1 patch (7 mg total) onto the skin daily. (Patient not taking: Reported on 02/20/2021) 28 patch 0   No current facility-administered medications for this visit.    ROS:   General:  No weight loss, Fever, chills  HEENT: No recent headaches, no nasal bleeding, no visual changes, no sore throat  Neurologic: No dizziness, blackouts, seizures. No recent symptoms of stroke or mini- stroke. No recent episodes of slurred speech, or temporary blindness.  Cardiac: No recent episodes of chest pain/pressure, no shortness of breath at rest.  No shortness of breath with exertion.  Denies history of atrial fibrillation or irregular heartbeat  Vascular: No history of rest pain in feet.  No history of claudication.  No history of non-healing ulcer, No history of DVT   Pulmonary: No home oxygen, no productive cough, no hemoptysis,  No asthma or wheezing  Musculoskeletal:  [ ]  Arthritis, [ ]  Low back pain,  [ ]  Joint pain  Hematologic:No history of hypercoagulable state.  No history of easy bleeding.  No history of anemia  Gastrointestinal: No  hematochezia or melena,  No gastroesophageal reflux, no trouble swallowing  Urinary: [ ]  chronic Kidney disease, [ ]  on HD - [ ]  MWF or [ ]  TTHS, [ ]  Burning with urination, [ ]  Frequent urination, [ ]  Difficulty urinating;   Skin: No rashes  Psychological: No history of anxiety,  No history of depression   Physical Examination  Vitals:   02/20/21 0928  BP: 132/83  Pulse: 82  Resp: 20  Temp: 98 F (36.7 C)  TempSrc: Temporal  SpO2: 99%  Weight: 153 lb 12.8 oz (69.8 kg)  Height: 6' 2.5" (1.892 m)  Body mass index is 19.48 kg/m.  General:  Alert and oriented, no acute distress HEENT: Normal Neck: No bruit or JVD Pulmonary: Clear to auscultation bilaterally Cardiac: Regular Rate and Rhythm without murmur Gastrointestinal: Soft, non-tender, non-distended, no mass, no scars Skin: No rash Extremity Pulses:  2+ radial, brachial, femoral, non palpable pedal pulses dorsalis pedis, posterior tibial  bilaterally Musculoskeletal: No deformity or edema  Neurologic: Upper and lower extremity motor 5/5 and symmetric  ABI Findings:  +---------+------------------+-----+----------+--------+  Right  Rt Pressure (mmHg)IndexWaveform Comment   +---------+------------------+-----+----------+--------+  Brachial 145                      +---------+------------------+-----+----------+--------+  ATA   93        0.59 monophasic      +---------+------------------+-----+----------+--------+  PTA   73        0.46 monophasic      +---------+------------------+-----+----------+--------+  Great Toe74        0.47 Abnormal       +---------+------------------+-----+----------+--------+   +---------+------------------+-----+----------+-------+  Left   Lt Pressure (mmHg)IndexWaveform Comment  +---------+------------------+-----+----------+-------+  Brachial 158                       +---------+------------------+-----+----------+-------+  ATA   54        0.34 monophasic      +---------+------------------+-----+----------+-------+  PTA   114        0.72 biphasic       +---------+------------------+-----+----------+-------+  Great Toe86        0.54 Abnormal       +---------+------------------+-----+----------+-------+   +-------+-----------+-----------+------------+------------+  ABI/TBIToday's ABIToday's TBIPrevious ABIPrevious TBI  +-------+-----------+-----------+------------+------------+  Right 0.59    0.47    0.58    0.47      +-------+-----------+-----------+------------+------------+  Left  0.72    0.54    0.87    0.53      +-------+-----------+-----------+------------+------------+       Summary:  Right: Resting right ankle-brachial index indicates moderate right lower  extremity arterial disease. The right toe-brachial index is abnormal.   Left: Resting left ankle-brachial index indicates moderate left lower  extremity arterial disease. The left toe-brachial index is abnormal.      Abdominal Aorta Findings:  +-----------+-------+----------+----------+--------+--------+--------+  Location  AP (cm)Trans (cm)PSV (cm/s)WaveformThrombusComments  +-----------+-------+----------+----------+--------+--------+--------+  Proximal  2.08  2.45   71                  +-----------+-------+----------+----------+--------+--------+--------+  Mid    2.07  2.29   70                  +-----------+-------+----------+----------+--------+--------+--------+  Distal   3.54  3.28   44                  +-----------+-------+----------+----------+--------+--------+--------+  RT CIA Prox1.6  1.6    53                   +-----------+-------+----------+----------+--------+--------+--------+  LT CIA Prox1.5  1.4    84                  +-----------+-------+----------+----------+--------+--------+--------+    Summary:  Abdominal Aorta: There is evidence of abnormal dilatation of the distal  Abdominal aorta. The largest aortic diameter has increased compared to  prior exam. Previous diameter measurement was 3.0 cm.    Ectatic bilateral Common iliac arteries.   ASSESSMENT:  70 y.o. male here for assessment of right lower quadrant pain and history of abdominal  aortic aneurysm and small bilateral iliac artery aneurysms. The patient's pain is not referable to his aorta. Duplex imaging of his aorta today shows smaller diameter as compared to the CT scan done 2 years ago. Size of ectatic bilateral iliac arteries is unchanged as compared to 3 years ago.  ABI's without change and his symptoms of occasional calf pain has not changed.   PLAN:  He will continue to stay active, try to stop smoking and continue medical management of his hyperlipidemia, HTN and daily ASA.  F/U in 1 year for repeat ABI's and AAA duplex.  If he develops symptoms of rest pain or non healing wounds he will call.     Roxy Horseman PA-C [facility] [place] Office: [phone]  MD in clinic [place]

## 2021-02-25 ENCOUNTER — Other Ambulatory Visit: Payer: Self-pay | Admitting: Family Medicine

## 2021-02-25 ENCOUNTER — Other Ambulatory Visit: Payer: Self-pay

## 2021-02-25 ENCOUNTER — Other Ambulatory Visit: Payer: Self-pay | Admitting: Cardiology

## 2021-02-25 ENCOUNTER — Other Ambulatory Visit: Payer: Medicare Other

## 2021-02-25 DIAGNOSIS — R972 Elevated prostate specific antigen [PSA]: Secondary | ICD-10-CM

## 2021-02-25 DIAGNOSIS — I10 Essential (primary) hypertension: Secondary | ICD-10-CM

## 2021-02-26 LAB — PSA: Prostate Specific Ag, Serum: 8.5 ng/mL — ABNORMAL HIGH (ref 0.0–4.0)

## 2021-03-04 ENCOUNTER — Ambulatory Visit: Payer: Medicare Other | Admitting: Urology

## 2021-03-13 ENCOUNTER — Telehealth: Payer: Self-pay | Admitting: Orthopaedic Surgery

## 2021-03-13 MED ORDER — HYDROCODONE-ACETAMINOPHEN 7.5-325 MG PO TABS: ORAL_TABLET | ORAL | 0 refills | Status: DC

## 2021-03-27 ENCOUNTER — Telehealth: Payer: Self-pay

## 2021-03-27 NOTE — Telephone Encounter (Signed)
OV scheduled with patient to discuss elevated psa lab work.

## 2021-03-27 NOTE — Telephone Encounter (Signed)
-----   Message from Cleon Gustin, MD sent at 03/26/2021 11:40 AM EDT ----- PSa elevated at 8.5 ----- Message ----- From: Iris Pert, LPN Sent: 03/30/3141   9:33 AM EDT To: Cleon Gustin, MD  Please review

## 2021-04-04 ENCOUNTER — Ambulatory Visit: Payer: Medicare Other | Admitting: Family Medicine

## 2021-04-08 ENCOUNTER — Encounter: Payer: Self-pay | Admitting: Urology

## 2021-04-08 ENCOUNTER — Ambulatory Visit (INDEPENDENT_AMBULATORY_CARE_PROVIDER_SITE_OTHER): Payer: Medicare Other | Admitting: Urology

## 2021-04-08 ENCOUNTER — Other Ambulatory Visit: Payer: Self-pay

## 2021-04-08 VITALS — BP 147/82 | HR 82 | Ht 74.5 in | Wt 157.0 lb

## 2021-04-08 DIAGNOSIS — I70213 Atherosclerosis of native arteries of extremities with intermittent claudication, bilateral legs: Secondary | ICD-10-CM

## 2021-04-08 DIAGNOSIS — R972 Elevated prostate specific antigen [PSA]: Secondary | ICD-10-CM | POA: Diagnosis not present

## 2021-04-08 NOTE — Patient Instructions (Signed)
Prostate-Specific Antigen Test Why am I having this test? The prostate-specific antigen (PSA) test is a screening test for prostate cancer. It can identify early signs of prostate cancer, which may allow for more effective treatment. Your health care provider may recommend that you have a PSA test starting at age 69 or that you have one earlier or later, depending on your risk factors for prostate cancer. You may also have a PSA test: To monitor treatment of prostate cancer. To check whether prostate cancer has returned after treatment. If you have signs of other conditions that can affect PSA levels, such as: An enlarged prostate that is not caused by cancer (benign prostatic hyperplasia, BPH). This condition is very common in older men. A prostate infection. What is being tested? This test measures the amount of PSA in your blood. PSA is a protein that is made in the prostate. The prostate naturally produces more PSA as you age, butvery high levels may be a sign of a medical condition. What kind of sample is taken?  A blood sample is required for this test. It is usually collected by inserting a needle into a blood vessel or by sticking a finger with a small needle. Bloodfor this test should be drawn before having an exam of the prostate. How do I prepare for this test? Do not ejaculate starting 24 hours before your test, or as long as told by your healthcare provider. Tell a health care provider about: Any allergies you have. All medicines you are taking, including vitamins, herbs, eye drops, creams, and over-the-counter medicines. This also includes: Medicines to assist with hair growth, such as finasteride. Any recent exposure to a medicine called diethylstilbestrol. Any blood disorders you have. Any recent procedures you have had, especially any procedures involving the prostate or rectum. Any medical conditions you have. Any recent urinary tract infections (UTIs) you have had. How are  the results reported? Your test results will be reported as a value that indicates how much PSA is in your blood. This will be given as nanograms of PSA per milliliter of blood (ng/mL). Your health care provider will compare your results to normal ranges that were established after testing a large group of people (reference ranges). Reference ranges may vary among labs and hospitals. PSA levels vary from person to person and generally increase with age. Because of this variation, there is no single PSA value that is considered normal for everyone. Instead, PSA reference ranges are used to describe whether your PSA levels are considered low or high (elevated). Common reference ranges are: Low: 0-2.5 ng/mL. Slightly to moderately elevated: 2.6-10.0 ng/mL. Moderately elevated: 10.0-19.9 ng/mL. Significantly elevated: 20 ng/mL or greater. Sometimes, the test results may report that a condition is present when it is not present (false-positive result). What do the results mean? A test result that is higher than 4 ng/mL may mean that you are at an increased risk for prostate cancer. However, a PSA test by itself is not enough to diagnose prostate cancer. High PSA levels may also be caused by the natural aging process, prostate infection, or BPH. PSA screening cannot tell you if your PSA is high due to cancer or a different cause. A prostate biopsy is theonly way to diagnose prostate cancer. A risk of having the PSA test is diagnosing and treating prostate cancer that would never have caused any symptoms or problems (overdiagnosis and overtreatment). Talk with your health care provider about what your results mean. Questions to ask your  health care provider Ask your health care provider, or the department that is doing the test: When will my results be ready? How will I get my results? What are my treatment options? What other tests do I need? What are my next steps? Summary The prostate-specific  antigen (PSA) test is a screening test for prostate cancer. Your health care provider may recommend that you have a PSA test starting at age 69 or that you have one earlier or later, depending on your risk factors for prostate cancer. A test result that is higher than 4 ng/mL may mean that you are at an increased risk for prostate cancer. However, elevated levels can be caused by a number of conditions other than prostate cancer. Talk with your health care provider about what your results mean. This information is not intended to replace advice given to you by your health care provider. Make sure you discuss any questions you have with your healthcare provider. Document Revised: 06/28/2020 Document Reviewed: 06/28/2020 Elsevier Patient Education  2022 [employer].

## 2021-04-08 NOTE — Progress Notes (Signed)
04/08/2021 10:10 AM   Dillon [name] 1951/06/08 [id number]  Referring provider: Fayrene Helper, MD [address], [place],  [place] [address]  Chief Complaint  Patient presents with   Follow-up    I am feeling ok.    HPI: [name] 2VC here for evaluation of elevated PSA. [name] has been 3-3.5 until 6 years ago when it increased to 1.1 then  increased to 10 three years ago. The PSA then declined to 7.1 two years ago and then 6 last year. This year PSA was 8.5. No family hx of prostate cancer. No significant LUTS. IPSS 7 QOL 2.    PMH: Past Medical History:  Diagnosis Date   Arthritis    2012   Asthma    childhood   COPD (chronic obstructive pulmonary disease) ([facility])    Hyperlipidemia    1990   Hypertension    1990   Nicotine addiction    PAD (peripheral artery disease) ([facility]) 2015    Surgical History: Past Surgical History:  Procedure Laterality Date   KNEE ARTHROSCOPY     both knees    LOWER EXTREMITY ANGIOGRAPHY N/A 02/04/2018   Procedure: LOWER EXTREMITY ANGIOGRAPHY;  Surgeon: Conrad Hanna, MD;  Location: [facility];  Service: Cardiovascular;  Laterality: N/A;    Home Medications:  Allergies as of 04/08/2021       Reactions   Penicillins Hives, Other (See Comments)   Has patient had a PCN reaction causing immediate rash, facial/tongue/throat swelling, SOB or lightheadedness with hypotension: Unknown Has patient had a PCN reaction causing severe rash involving mucus membranes or skin necrosis: No Has patient had a PCN reaction that required hospitalization: Yes Has patient had a PCN reaction occurring within the last 10 years: No If all of the above answers are "NO", then may proceed with Cephalosporin use.        Medication List        Accurate as of April 08, 2021 10:10 AM. If you have any questions, ask your nurse or doctor.          Alphagan P 0.1 % Soln Generic drug: brimonidine SMARTSIG:1 Drop(s) In Eye(s)  Morning-Evening   amLODipine 10 MG tablet Commonly known as: NORVASC TAKE 1 TABLET BY MOUTH EVERY DAY   aspirin EC 81 MG tablet Take 81 mg by mouth daily.   atorvastatin 80 MG tablet Commonly known as: LIPITOR TAKE 1 TABLET BY MOUTH EVERY DAY   Breo Ellipta 200-25 MCG/INH Aepb Generic drug: fluticasone furoate-vilanterol INHALE 1 PUFF BY MOUTH EVERY DAY   cyclobenzaprine 10 MG tablet Commonly known as: FLEXERIL TAKE 1 TABLET (10 MG TOTAL) BY MOUTH AT BEDTIME AS NEEDED FOR SPASM.   gabapentin 300 MG capsule Commonly known as: NEURONTIN TAKE 1 CAPSULE (300 MG TOTAL) BY MOUTH AT BEDTIME.   HYDROcodone-acetaminophen 7.5-325 MG tablet Commonly known as: Norco One tablet every six hours as need for pain.  Must last 28 days.   ipratropium 0.02 % nebulizer solution Commonly known as: ATROVENT Take 2.5 mLs (0.5 mg total) by nebulization every 6 (six) hours as needed for wheezing or shortness of breath.   latanoprost 0.005 % ophthalmic solution Commonly known as: XALATAN 1 drop daily.   levalbuterol 0.63 MG/3ML nebulizer solution Commonly known as: XOPENEX Take 3 mLs (0.63 mg total) by nebulization every 8 (eight) hours as needed for wheezing or shortness of breath.   meloxicam 15 MG tablet Commonly known as: MOBIC TAKE 1  TABLET BY MOUTH ONCE DAILY AS NEEDED FOR UNCONTROLLED ARTHRITIS PAIN   nicotine 7 mg/24hr patch Commonly known as: Nicoderm CQ Place 1 patch (7 mg total) onto the skin daily.   predniSONE 5 MG (21) Tbpk tablet Commonly known as: STERAPRED UNI-PAK 21 TAB Take 6 pills first day; 5 pills second day; 4 pills third day; 3 pills fourth day; 2 pills next day and 1 pill last day.   spironolactone 50 MG tablet Commonly known as: ALDACTONE TAKE 1 TABLET BY MOUTH EVERY DAY   UNABLE TO FIND Nebulizer facemask and tubing x 1  DX J44.9   UNABLE TO FIND Ensure- 2 cans daily  DX protein-calorie malnutrition   Vitamin D (Ergocalciferol) 1.25 MG (50000 UNIT)  Caps capsule Commonly known as: DRISDOL TAKE 1 CAPSULE (50,000 UNITS TOTAL) BY MOUTH ONCE A WEEK. ONE CAPSULE ONCE WEEKLY        Allergies:  Allergies  Allergen Reactions   Penicillins Hives and Other (See Comments)    Has patient had a PCN reaction causing immediate rash, facial/tongue/throat swelling, SOB or lightheadedness with hypotension: Unknown Has patient had a PCN reaction causing severe rash involving mucus membranes or skin necrosis: No Has patient had a PCN reaction that required hospitalization: Yes Has patient had a PCN reaction occurring within the last 10 years: No If all of the above answers are "NO", then may proceed with Cephalosporin use.     Family History: Family History  Problem Relation Age of Onset   Heart attack Mother    Diabetes Mother    Hypertension Mother    Stroke Brother    Hypertension Sister    Hypertension Sister    Hypertension Sister    Hypertension Brother    Colon cancer Neg Hx     Social History:  reports that he has been smoking cigarettes. He started smoking about 51 years ago. He has never used smokeless tobacco. He reports current alcohol use. He reports that he does not use drugs.  ROS: All other review of systems were reviewed and are negative except what is noted above in HPI  Physical Exam: BP (!) 147/82   Pulse 82   Ht 6' 2.5" (1.892 m)   Wt 157 lb (71.2 kg)   BMI 19.89 kg/m   Constitutional:  Alert and oriented, No acute distress. HEENT: [place] AT, moist mucus membranes.  Trachea midline, no masses. Cardiovascular: No clubbing, cyanosis, or edema. Respiratory: Normal respiratory effort, no increased work of breathing. GI: Abdomen is soft, nontender, nondistended, no abdominal masses GU: No CVA tenderness. Circumcised phallus. No masses/lesions on penis, testis, scrotum. Prostate 40g smooth no nodules no induration.  Lymph: No cervical or inguinal lymphadenopathy. Skin: No rashes, bruises or suspicious  lesions. Neurologic: Grossly intact, no focal deficits, moving all 4 extremities. Psychiatric: Normal mood and affect.  Laboratory Data: Lab Results  Component Value Date   WBC 5.9 04/16/2020   HGB 13.8 04/16/2020   HCT 40.1 04/16/2020   MCV 92.4 04/16/2020   PLT 431 (H) 04/16/2020    Lab Results  Component Value Date   CREATININE 0.99 10/04/2020    Lab Results  Component Value Date   PSA 6.8 (H) 04/16/2020   PSA 7.1 (H) 06/09/2018   PSA 10.39 (H) 01/24/2016    No results found for: TESTOSTERONE  Lab Results  Component Value Date   HGBA1C 5.2 04/16/2020    Urinalysis    Component Value Date/Time   COLORURINE YELLOW 05/03/2008 0116   APPEARANCEUR  CLEAR 05/03/2008 0116   LABSPEC 1.023 05/03/2008 0116   PHURINE 6.5 05/03/2008 0116   GLUCOSEU NEG mg/dL 05/03/2008 0116   BILIRUBINUR large 05/17/2020 0904   KETONESUR NEG mg/dL 05/03/2008 0116   PROTEINUR Negative 05/17/2020 0904   PROTEINUR NEG mg/dL 05/03/2008 0116   UROBILINOGEN negative (A) 05/17/2020 0904   UROBILINOGEN 1 05/03/2008 0116   NITRITE neg 05/17/2020 0904   NITRITE NEG 05/03/2008 0116   LEUKOCYTESUR Small (1+) (A) 05/17/2020 0904    No results found for: LABMICR, WBCUA, RBCUA, LABEPIT, MUCUS, BACTERIA  Pertinent Imaging:  No results found for this or any previous visit.  No results found for this or any previous visit.  No results found for this or any previous visit.  No results found for this or any previous visit.  Results for orders placed during the hospital encounter of 02/26/15  US Renal  Narrative CLINICAL DATA:  Bilateral flank pain, hypertension  EXAM: RENAL / URINARY TRACT ULTRASOUND COMPLETE  COMPARISON:  PET-CT of 02/16/2014  FINDINGS: Right Kidney:  Length: 12.7 cm. There is minimal fullness of the right renal pelvis. No definite hydronephrosis is seen.  Left Kidney:  Length: 12.3 cm. There is also mild fullness of the renal pelvis. No definite hydronephrosis  is noted.  Bladder:  The urinary bladder is unremarkable. Bilateral ureteral jets are noted. The prostate is prominent measuring 6.4 x 5.6 x 5.0 cm with a volume of 92 cubic cm.  IMPRESSION: 1. Minimal fullness of both renal pelves. 2. Prominent prostate.   Electronically Signed By: Ivar Drape M.D. On: 02/26/2015 11:56  No results found for this or any previous visit.  No results found for this or any previous visit.  No results found for this or any previous visit.   Assessment & Plan:    1. Elevated PSA The patient and I talked about etiologies of elevated PSA.  We discussed the possible relationship between elevated PSA and prostate cancer, BPH, prostatitis, infection trauma and recent ejaculations.  I recommeded prostate biopsy bu the patient wishes to pursue surveillance. RTC 6 months free and total PSA  If it remains elevated with a positive rising trend we will discuss prostate biopsy at his follow-up appointment.  - Urinalysis, Routine w reflex microscopic   No follow-ups on file.  Nicolette Bang, MD  [facility] [place]

## 2021-04-08 NOTE — Progress Notes (Signed)
Urological Symptom Review  Patient is experiencing the following symptoms: Frequent urination Hard to postpone urination Get up at night to urinate   Review of Systems  Gastrointestinal (upper)  : Negative for upper GI symptoms  Gastrointestinal (lower) : Negative for lower GI symptoms  Constitutional : Fatigue  Skin: Negative for skin symptoms  Eyes: Blurred vision Double vision  Ear/Nose/Throat : Negative for Ear/Nose/Throat symptoms  Hematologic/Lymphatic: Negative for Hematologic/Lymphatic symptoms  Cardiovascular : Negative for cardiovascular symptoms  Respiratory : Shortness of breath  Endocrine: Negative for endocrine symptoms  Musculoskeletal: Back pain Joint pain  Neurological: Negative for neurological symptoms  Psychologic: Negative for psychiatric symptoms

## 2021-04-09 ENCOUNTER — Telehealth: Payer: Self-pay | Admitting: Orthopaedic Surgery

## 2021-04-09 MED ORDER — HYDROCODONE-ACETAMINOPHEN 7.5-325 MG PO TABS: ORAL_TABLET | ORAL | 0 refills | Status: DC

## 2021-04-16 ENCOUNTER — Other Ambulatory Visit: Payer: Self-pay

## 2021-04-16 ENCOUNTER — Ambulatory Visit (INDEPENDENT_AMBULATORY_CARE_PROVIDER_SITE_OTHER): Payer: Medicare Other | Admitting: Orthopaedic Surgery

## 2021-04-16 ENCOUNTER — Encounter: Payer: Self-pay | Admitting: Orthopaedic Surgery

## 2021-04-16 VITALS — BP 140/78 | HR 82 | Ht 74.5 in | Wt 155.0 lb

## 2021-04-16 DIAGNOSIS — G8929 Other chronic pain: Secondary | ICD-10-CM | POA: Diagnosis not present

## 2021-04-16 DIAGNOSIS — M5442 Lumbago with sciatica, left side: Secondary | ICD-10-CM

## 2021-04-16 DIAGNOSIS — F17210 Nicotine dependence, cigarettes, uncomplicated: Secondary | ICD-10-CM

## 2021-04-16 DIAGNOSIS — I70213 Atherosclerosis of native arteries of extremities with intermittent claudication, bilateral legs: Secondary | ICD-10-CM | POA: Diagnosis not present

## 2021-04-16 NOTE — Progress Notes (Signed)
[name], male DOB:Mar 13, 1951, 70 y.o. IZT:[id number]  Chief Complaint  Patient presents with   Back Pain    Follow up/better today because he didn't do anything yesterday    HPI  Dillon [name] is a 70 y.o. male who has chronic lower back pain.  He has good and bad days.  He does better in the summer and warmer months.  He is doing his exercises and taking his medicine.  He has no new trauma, no weakness.   Body mass index is 19.63 kg/m.  ROS  Review of Systems  Musculoskeletal:  Positive for arthralgias and back pain.  All other systems reviewed and are negative.  All other systems reviewed and are negative.  The following is a summary of the past history medically, past history surgically, known current medicines, social history and family history.  This information is gathered electronically by the computer from prior information and documentation.  I review this each visit and have found including this information at this point in the chart is beneficial and informative.    Past Medical History:  Diagnosis Date   Arthritis    2012   Asthma    childhood   COPD (chronic obstructive pulmonary disease) ([facility])    Hyperlipidemia    1990   Hypertension    1990   Nicotine addiction    PAD (peripheral artery disease) ([facility]) 2015    Past Surgical History:  Procedure Laterality Date   KNEE ARTHROSCOPY     both knees    LOWER EXTREMITY ANGIOGRAPHY N/A 02/04/2018   Procedure: LOWER EXTREMITY ANGIOGRAPHY;  Surgeon: Conrad [place], MD;  Location: [facility];  Service: Cardiovascular;  Laterality: N/A;    Family History  Problem Relation Age of Onset   Heart attack Mother    Diabetes Mother    Hypertension Mother    Stroke Brother    Hypertension Sister    Hypertension Sister    Hypertension Sister    Hypertension Brother    Colon cancer Neg Hx     Social History Social History   Tobacco Use   Smoking status: Some Days    Years: 48.00     Pack years: 0.00    Types: Cigarettes    Start date: 10/16/1969   Smokeless tobacco: Never  Vaping Use   Vaping Use: Never used  Substance Use Topics   Alcohol use: Yes    Alcohol/week: 0.0 standard drinks    Comment: occassional , max of 72 oz pwr month   Drug use: No    Allergies  Allergen Reactions   Penicillins Hives and Other (See Comments)    Has patient had a PCN reaction causing immediate rash, facial/tongue/throat swelling, SOB or lightheadedness with hypotension: Unknown Has patient had a PCN reaction causing severe rash involving mucus membranes or skin necrosis: No Has patient had a PCN reaction that required hospitalization: Yes Has patient had a PCN reaction occurring within the last 10 years: No If all of the above answers are "NO", then may proceed with Cephalosporin use.     Current Outpatient Medications  Medication Sig Dispense Refill   ALPHAGAN P 0.1 % SOLN SMARTSIG:1 Drop(s) In Eye(s) Morning-Evening     amLODipine (NORVASC) 10 MG tablet TAKE 1 TABLET BY MOUTH EVERY DAY 90 tablet 1   aspirin EC 81 MG tablet Take 81 mg by mouth daily.     atorvastatin (LIPITOR) 80 MG tablet TAKE 1 TABLET BY MOUTH EVERY DAY  30 tablet 0   BREO ELLIPTA 200-25 MCG/INH AEPB INHALE 1 PUFF BY MOUTH EVERY DAY 60 each 5   cyclobenzaprine (FLEXERIL) 10 MG tablet TAKE 1 TABLET (10 MG TOTAL) BY MOUTH AT BEDTIME AS NEEDED FOR SPASM. 30 tablet 3   gabapentin (NEURONTIN) 300 MG capsule TAKE 1 CAPSULE (300 MG TOTAL) BY MOUTH AT BEDTIME. 30 capsule 2   HYDROcodone-acetaminophen (NORCO) 7.5-325 MG tablet One tablet every six hours as need for pain.  Must last 28 days. 110 tablet 0   ipratropium (ATROVENT) 0.02 % nebulizer solution Take 2.5 mLs (0.5 mg total) by nebulization every 6 (six) hours as needed for wheezing or shortness of breath. 75 mL 12   latanoprost (XALATAN) 0.005 % ophthalmic solution 1 drop daily.     levalbuterol (XOPENEX) 0.63 MG/3ML nebulizer solution Take 3 mLs (0.63 mg  total) by nebulization every 8 (eight) hours as needed for wheezing or shortness of breath. 3 mL 12   meloxicam (MOBIC) 15 MG tablet TAKE 1 TABLET BY MOUTH ONCE DAILY AS NEEDED FOR UNCONTROLLED ARTHRITIS PAIN 90 tablet 0   nicotine (NICODERM CQ) 7 mg/24hr patch Place 1 patch (7 mg total) onto the skin daily. 28 patch 0   predniSONE (STERAPRED UNI-PAK 21 TAB) 5 MG (21) TBPK tablet Take 6 pills first day; 5 pills second day; 4 pills third day; 3 pills fourth day; 2 pills next day and 1 pill last day. 21 tablet 0   spironolactone (ALDACTONE) 50 MG tablet TAKE 1 TABLET BY MOUTH EVERY DAY 90 tablet 1   UNABLE TO FIND Nebulizer facemask and tubing x 1  DX J44.9 1 each 0   UNABLE TO FIND Ensure- 2 cans daily  DX protein-calorie malnutrition 60 each 11   Vitamin D, Ergocalciferol, (DRISDOL) 1.25 MG (50000 UNIT) CAPS capsule TAKE 1 CAPSULE (50,000 UNITS TOTAL) BY MOUTH ONCE A WEEK. ONE CAPSULE ONCE WEEKLY 12 capsule 2   No current facility-administered medications for this visit.     Physical Exam  Blood pressure 140/78, pulse 82, height 6' 2.5" (1.892 m), weight 155 lb (70.3 kg).  Constitutional: overall normal hygiene, normal nutrition, well developed, normal grooming, normal body habitus. Assistive device:none  Musculoskeletal: gait and station Limp none, muscle tone and strength are normal, no tremors or atrophy is present.  .  Neurological: coordination overall normal.  Deep tendon reflex/nerve stretch intact.  Sensation normal.  Cranial nerves II-XII intact.   Skin:   Normal overall no scars, lesions, ulcers or rashes. No psoriasis.  Psychiatric: Alert and oriented x 3.  Recent memory intact, remote memory unclear.  Normal mood and affect. Well groomed.  Good eye contact.  Cardiovascular: overall no swelling, no varicosities, no edema bilaterally, normal temperatures of the legs and arms, no clubbing, cyanosis and good capillary refill.  Lymphatic: palpation is normal.  Spine/Pelvis  examination:  Inspection:  Overall, sacoiliac joint benign and hips nontender; without crepitus or defects.   Thoracic spine inspection: Alignment normal without kyphosis present   Lumbar spine inspection:  Alignment  with normal lumbar lordosis, without scoliosis apparent.   Thoracic spine palpation:  without tenderness of spinal processes   Lumbar spine palpation: without tenderness of lumbar area; without tightness of lumbar muscles    Range of Motion:   Lumbar flexion, forward flexion is normal without pain or tenderness    Lumbar extension is full without pain or tenderness   Left lateral bend is normal without pain or tenderness   Right lateral bend is  normal without pain or tenderness   Straight leg raising is normal  Strength & tone: normal   Stability overall normal stability  All other systems reviewed and are negative   The patient has been educated about the nature of the problem(s) and counseled on treatment options.  The patient appeared to understand what I have discussed and is in agreement with it.  Encounter Diagnoses  Name Primary?   Chronic midline low back pain with left-sided sciatica Yes   Cigarette nicotine dependence without complication     PLAN Call if any problems.  Precautions discussed.  Continue current medications.   Return to clinic 3 months   Electronically Signed Sanjuana Kava, MD 6/21/20228:05 AM

## 2021-05-04 ENCOUNTER — Other Ambulatory Visit: Payer: Self-pay | Admitting: Family Medicine

## 2021-05-07 ENCOUNTER — Other Ambulatory Visit: Payer: Self-pay | Admitting: Cardiology

## 2021-05-08 ENCOUNTER — Ambulatory Visit (INDEPENDENT_AMBULATORY_CARE_PROVIDER_SITE_OTHER): Payer: Medicare Other | Admitting: *Deleted

## 2021-05-08 ENCOUNTER — Other Ambulatory Visit: Payer: Self-pay

## 2021-05-08 ENCOUNTER — Telehealth: Payer: Self-pay | Admitting: Orthopaedic Surgery

## 2021-05-08 DIAGNOSIS — Z1211 Encounter for screening for malignant neoplasm of colon: Secondary | ICD-10-CM

## 2021-05-08 DIAGNOSIS — Z0000 Encounter for general adult medical examination without abnormal findings: Secondary | ICD-10-CM

## 2021-05-08 NOTE — Patient Instructions (Signed)
Mr. Dillon [name] , Thank you for taking time to come for your Medicare Wellness Visit. I appreciate your ongoing commitment to your health goals. Please review the following plan we discussed and let me know if I can assist you in the future.   Screening recommendations/referrals: Colonoscopy: Due now gastro referral placed  Recommended yearly ophthalmology/optometry visit for glaucoma screening and checkup Recommended yearly dental visit for hygiene and checkup  Vaccinations: Influenza vaccine: 05-27-21 Pneumococcal vaccine: Completed  Tdap vaccine: Due Now Shingles vaccine: Due Now     Advanced directives: Advised to bring healthcare power of attorney paperwork to office  Conditions/risks identified: Hypertension   Next appointment: 1 year   Preventive Care 30 Years and Older, Male Preventive care refers to lifestyle choices and visits with your health care provider that can promote health and wellness. What does preventive care include? A yearly physical exam. This is also called an annual well check. Dental exams once or twice a year. Routine eye exams. Ask your health care provider how often you should have your eyes checked. Personal lifestyle choices, including: Daily care of your teeth and gums. Regular physical activity. Eating a healthy diet. Avoiding tobacco and drug use. Limiting alcohol use. Practicing safe sex. Taking low doses of aspirin every day. Taking vitamin and mineral supplements as recommended by your health care provider. What happens during an annual well check? The services and screenings done by your health care provider during your annual well check will depend on your age, overall health, lifestyle risk factors, and family history of disease. Counseling  Your health care provider may ask you questions about your: Alcohol use. Tobacco use. Drug use. Emotional well-being. Home and relationship well-being. Sexual activity. Eating habits. History of  falls. Memory and ability to understand (cognition). Work and work Statistician. Screening  You may have the following tests or measurements: Height, weight, and BMI. Blood pressure. Lipid and cholesterol levels. These may be checked every 5 years, or more frequently if you are over 47 years old. Skin check. Lung cancer screening. You may have this screening every year starting at age 62 if you have a 30-pack-year history of smoking and currently smoke or have quit within the past 15 years. Fecal occult blood test (FOBT) of the stool. You may have this test every year starting at age 73. Flexible sigmoidoscopy or colonoscopy. You may have a sigmoidoscopy every 5 years or a colonoscopy every 10 years starting at age 16. Prostate cancer screening. Recommendations will vary depending on your family history and other risks. Hepatitis C blood test. Hepatitis B blood test. Sexually transmitted disease (STD) testing. Diabetes screening. This is done by checking your blood sugar (glucose) after you have not eaten for a while (fasting). You may have this done every 1-3 years. Abdominal aortic aneurysm (AAA) screening. You may need this if you are a current or former smoker. Osteoporosis. You may be screened starting at age 88 if you are at high risk. Talk with your health care provider about your test results, treatment options, and if necessary, the need for more tests. Vaccines  Your health care provider may recommend certain vaccines, such as: Influenza vaccine. This is recommended every year. Tetanus, diphtheria, and acellular pertussis (Tdap, Td) vaccine. You may need a Td booster every 10 years. Zoster vaccine. You may need this after age 52. Pneumococcal 13-valent conjugate (PCV13) vaccine. One dose is recommended after age 39. Pneumococcal polysaccharide (PPSV23) vaccine. One dose is recommended after age 28. Talk to your  health care provider about which screenings and vaccines you need and  how often you need them. This information is not intended to replace advice given to you by your health care provider. Make sure you discuss any questions you have with your health care provider. Document Released: 11/09/2015 Document Revised: 07/02/2016 Document Reviewed: 08/14/2015 Elsevier Interactive Patient Education  2017 [place] Prevention in the Home Falls can cause injuries. They can happen to people of all ages. There are many things you can do to make your home safe and to help prevent falls. What can I do on the outside of my home? Regularly fix the edges of walkways and driveways and fix any cracks. Remove anything that might make you trip as you walk through a door, such as a raised step or threshold. Trim any bushes or trees on the path to your home. Use bright outdoor lighting. Clear any walking paths of anything that might make someone trip, such as rocks or tools. Regularly check to see if handrails are loose or broken. Make sure that both sides of any steps have handrails. Any raised decks and porches should have guardrails on the edges. Have any leaves, snow, or ice cleared regularly. Use sand or salt on walking paths during winter. Clean up any spills in your garage right away. This includes oil or grease spills. What can I do in the bathroom? Use night lights. Install grab bars by the toilet and in the tub and shower. Do not use towel bars as grab bars. Use non-skid mats or decals in the tub or shower. If you need to sit down in the shower, use a plastic, non-slip stool. Keep the floor dry. Clean up any water that spills on the floor as soon as it happens. Remove soap buildup in the tub or shower regularly. Attach bath mats securely with double-sided non-slip rug tape. Do not have throw rugs and other things on the floor that can make you trip. What can I do in the bedroom? Use night lights. Make sure that you have a light by your bed that is easy to  reach. Do not use any sheets or blankets that are too big for your bed. They should not hang down onto the floor. Have a firm chair that has side arms. You can use this for support while you get dressed. Do not have throw rugs and other things on the floor that can make you trip. What can I do in the kitchen? Clean up any spills right away. Avoid walking on wet floors. Keep items that you use a lot in easy-to-reach places. If you need to reach something above you, use a strong step stool that has a grab bar. Keep electrical cords out of the way. Do not use floor polish or wax that makes floors slippery. If you must use wax, use non-skid floor wax. Do not have throw rugs and other things on the floor that can make you trip. What can I do with my stairs? Do not leave any items on the stairs. Make sure that there are handrails on both sides of the stairs and use them. Fix handrails that are broken or loose. Make sure that handrails are as long as the stairways. Check any carpeting to make sure that it is firmly attached to the stairs. Fix any carpet that is loose or worn. Avoid having throw rugs at the top or bottom of the stairs. If you do have throw rugs, attach them  to the floor with carpet tape. Make sure that you have a light switch at the top of the stairs and the bottom of the stairs. If you do not have them, ask someone to add them for you. What else can I do to help prevent falls? Wear shoes that: Do not have high heels. Have rubber bottoms. Are comfortable and fit you well. Are closed at the toe. Do not wear sandals. If you use a stepladder: Make sure that it is fully opened. Do not climb a closed stepladder. Make sure that both sides of the stepladder are locked into place. Ask someone to hold it for you, if possible. Clearly mark and make sure that you can see: Any grab bars or handrails. First and last steps. Where the edge of each step is. Use tools that help you move  around (mobility aids) if they are needed. These include: Canes. Walkers. Scooters. Crutches. Turn on the lights when you go into a dark area. Replace any light bulbs as soon as they burn out. Set up your furniture so you have a clear path. Avoid moving your furniture around. If any of your floors are uneven, fix them. If there are any pets around you, be aware of where they are. Review your medicines with your doctor. Some medicines can make you feel dizzy. This can increase your chance of falling. Ask your doctor what other things that you can do to help prevent falls. This information is not intended to replace advice given to you by your health care provider. Make sure you discuss any questions you have with your health care provider. Document Released: 08/09/2009 Document Revised: 03/20/2016 Document Reviewed: 11/17/2014 Elsevier Interactive Patient Education  2017 [employer].

## 2021-05-08 NOTE — Progress Notes (Signed)
Subjective:   Dillon [name] is a 70 y.o. male who presents for Medicare Annual/Subsequent preventive examination.  Review of Systems           Objective:    There were no vitals filed for this visit. There is no height or weight on file to calculate BMI.  Advanced Directives 12/27/2018 11/07/2018 10/14/2018 10/05/2018 02/17/2018 01/20/2018  Does Patient Have a Medical Advance Directive? No No No No Yes No  Type of Advance Directive - - - - Press photographer -  Would patient like information on creating a medical advance directive? No - Patient declined - No - Patient declined - - Yes (MAU/Ambulatory/Procedural Areas - Information given)    Current Medications (verified) Outpatient Encounter Medications as of 05/08/2021  Medication Sig   ALPHAGAN P 0.1 % SOLN SMARTSIG:1 Drop(s) In Eye(s) Morning-Evening   amLODipine (NORVASC) 10 MG tablet TAKE 1 TABLET BY MOUTH EVERY DAY   aspirin EC 81 MG tablet Take 81 mg by mouth daily.   atorvastatin (LIPITOR) 80 MG tablet TAKE 1 TABLET BY MOUTH EVERY DAY   BREO ELLIPTA 200-25 MCG/INH AEPB INHALE 1 PUFF BY MOUTH EVERY DAY   cyclobenzaprine (FLEXERIL) 10 MG tablet TAKE 1 TABLET (10 MG TOTAL) BY MOUTH AT BEDTIME AS NEEDED FOR SPASM.   gabapentin (NEURONTIN) 300 MG capsule TAKE 1 CAPSULE (300 MG TOTAL) BY MOUTH AT BEDTIME.   HYDROcodone-acetaminophen (NORCO) 7.5-325 MG tablet One tablet every six hours as need for pain.  Must last 28 days.   ipratropium (ATROVENT) 0.02 % nebulizer solution Take 2.5 mLs (0.5 mg total) by nebulization every 6 (six) hours as needed for wheezing or shortness of breath.   latanoprost (XALATAN) 0.005 % ophthalmic solution 1 drop daily.   levalbuterol (XOPENEX) 0.63 MG/3ML nebulizer solution Take 3 mLs (0.63 mg total) by nebulization every 8 (eight) hours as needed for wheezing or shortness of breath.   meloxicam (MOBIC) 15 MG tablet TAKE 1 TABLET BY MOUTH ONCE DAILY AS NEEDED FOR UNCONTROLLED ARTHRITIS PAIN    nicotine (NICODERM CQ) 7 mg/24hr patch Place 1 patch (7 mg total) onto the skin daily.   predniSONE (STERAPRED UNI-PAK 21 TAB) 5 MG (21) TBPK tablet Take 6 pills first day; 5 pills second day; 4 pills third day; 3 pills fourth day; 2 pills next day and 1 pill last day.   spironolactone (ALDACTONE) 50 MG tablet TAKE 1 TABLET BY MOUTH EVERY DAY   UNABLE TO FIND Nebulizer facemask and tubing x 1  DX J44.9   UNABLE TO FIND Ensure- 2 cans daily  DX protein-calorie malnutrition   Vitamin D, Ergocalciferol, (DRISDOL) 1.25 MG (50000 UNIT) CAPS capsule TAKE 1 CAPSULE (50,000 UNITS TOTAL) BY MOUTH ONCE A WEEK. ONE CAPSULE ONCE WEEKLY   No facility-administered encounter medications on file as of 05/08/2021.    Allergies (verified) Penicillins   History: Past Medical History:  Diagnosis Date   Arthritis    2012   Asthma    childhood   COPD (chronic obstructive pulmonary disease) ([place])    Hyperlipidemia    1990   Hypertension    1990   Nicotine addiction    PAD (peripheral artery disease) ([place]) 2015   Past Surgical History:  Procedure Laterality Date   KNEE ARTHROSCOPY     both knees    LOWER EXTREMITY ANGIOGRAPHY N/A 02/04/2018   Procedure: LOWER EXTREMITY ANGIOGRAPHY;  Surgeon: Conrad Millwood, MD;  Location: [facility];  Service: Cardiovascular;  Laterality:  N/A;   Family History  Problem Relation Age of Onset   Heart attack Mother    Diabetes Mother    Hypertension Mother    Stroke Brother    Hypertension Sister    Hypertension Sister    Hypertension Sister    Hypertension Brother    Colon cancer Neg Hx    Social History   Socioeconomic History   Marital status: Single    Spouse name: Not on file   Number of children: Not on file   Years of education: Not on file   Highest education level: Not on file  Occupational History   Not on file  Tobacco Use   Smoking status: Some Days    Years: 48.00    Pack years: 0.00    Types: Cigarettes    Start date:  10/16/1969   Smokeless tobacco: Never  Vaping Use   Vaping Use: Never used  Substance and Sexual Activity   Alcohol use: Yes    Alcohol/week: 0.0 standard drinks    Comment: occassional , max of 72 oz pwr month   Drug use: No   Sexual activity: Yes    Partners: Female  Other Topics Concern   Not on file  Social History Narrative   Not on file   Social Determinants of Health   Financial Resource Strain: Not on file  Food Insecurity: Not on file  Transportation Needs: Not on file  Physical Activity: Not on file  Stress: Not on file  Social Connections: Not on file    Tobacco Counseling Ready to quit: Not Answered Counseling given: Not Answered   Clinical Intake:                 Diabetic?No         Activities of Daily Living No flowsheet data found.  Patient Care Team: Fayrene Helper, MD as PCP - General (Family Medicine) Harl Bowie, Alphonse Guild, MD as Consulting Physician (Cardiology) Magdalen Spatz, NP as Nurse Practitioner (Pulmonary Disease) Sanjuana Kava, MD as Consulting Physician (Orthopedic Surgery) Danie Binder, MD (Inactive) as Consulting Physician (Gastroenterology)  Indicate any recent Medical Services you may have received from other than [facility] providers in the past year (date may be approximate).     Assessment:   This is a routine wellness examination for Dillon [name].  Hearing/Vision screen No results found.  Dietary issues and exercise activities discussed:     Goals Addressed   None   Depression Screen PHQ 2/9 Scores 11/29/2020 04/11/2020 01/24/2020 06/23/2019 10/28/2018 09/09/2018 06/09/2018  PHQ - 2 Score 0 0 0 0 0 0 0  PHQ- 9 Score - - - - - - -    Fall Risk Fall Risk  11/29/2020 07/18/2020 04/11/2020 01/24/2020 06/23/2019  Falls in the past year? 0 0 0 0 0  Number falls in past yr: 0 0 - 0 0  Injury with Fall? 0 0 - 0 0  Risk for fall due to : No Fall Risks - - - -  Follow up Falls evaluation completed - - Falls evaluation  completed -    FALL RISK PREVENTION PERTAINING TO THE HOME:  Any stairs in or around the home? No  If so, are there any without handrails? No  Home free of loose throw rugs in walkways, pet beds, electrical cords, etc? Yes  Adequate lighting in your home to reduce risk of falls? Yes   ASSISTIVE DEVICES UTILIZED TO PREVENT FALLS:  Life alert? No  Use of  a cane, walker or w/c? No  Grab bars in the bathroom? Yes  Shower chair or bench in shower? No  Elevated toilet seat or a handicapped toilet? No   TIMED UP AND GO:  Was the test performed? No .  Length of time to ambulate 10 feet: NA sec.     Cognitive Function:        Immunizations Immunization History  Administered Date(s) Administered   [employer] Vaccination 03/08/2020, 04/05/2020, 10/19/2020   Pneumococcal Polysaccharide-23 05/19/2013   Tdap 03/26/2011   Zoster, Live 05/19/2013    TDAP status: Due, Education has been provided regarding the importance of this vaccine. Advised may receive this vaccine at local pharmacy or Health Dept. Aware to provide a copy of the vaccination record if obtained from local pharmacy or Health Dept. Verbalized acceptance and understanding.  Flu Vaccine status: Due, Education has been provided regarding the importance of this vaccine. Advised may receive this vaccine at local pharmacy or Health Dept. Aware to provide a copy of the vaccination record if obtained from local pharmacy or Health Dept. Verbalized acceptance and understanding.  Pneumococcal vaccine status: Up to date  Covid-19 vaccine status: Completed vaccines  Qualifies for Shingles Vaccine? Yes   Zostavax completed No   Shingrix Completed?: No.    Education has been provided regarding the importance of this vaccine. Patient has been advised to call insurance company to determine out of pocket expense if they have not yet received this vaccine. Advised may also receive vaccine at local pharmacy or Health Dept.  Verbalized acceptance and understanding.  Screening Tests Health Maintenance  Topic Date Due   COLONOSCOPY (Pts 45-6yrs Insurance coverage will need to be confirmed)  Never done   Zoster Vaccines- Shingrix (1 of 2) Never done   COVID-19 Vaccine (4 - Booster for Moderna series) 02/17/2021   TETANUS/TDAP  03/25/2021   PNA vac Low Risk Adult (1 of 2 - PCV13) 07/10/2021 (Originally 10/16/2017)   INFLUENZA VACCINE  05/27/2021   Hepatitis C Screening  Completed   HPV VACCINES  Aged Out    Health Maintenance  Health Maintenance Due  Topic Date Due   COLONOSCOPY (Pts 45-35yrs Insurance coverage will need to be confirmed)  Never done   Zoster Vaccines- Shingrix (1 of 2) Never done   COVID-19 Vaccine (4 - Booster for Moderna series) 02/17/2021   TETANUS/TDAP  03/25/2021    Colorectal cancer screening: Referral to GI placed yes. Pt aware the office will call re: appt.  Lung Cancer Screening: (Low Dose CT Chest recommended if Age 100-80 years, 30 pack-year currently smoking OR have quit w/in 15years.) does qualify.   Lung Cancer Screening Referral: yes  Additional Screening:  Hepatitis C Screening: does qualify; Completed 01-24-16  Vision Screening: Recommended annual ophthalmology exams for early detection of glaucoma and other disorders of the eye. Is the patient up to date with their annual eye exam?  Yes  Who is the provider or what is the name of the office in which the patient attends annual eye exams? [facility] If pt is not established with a provider, would they like to be referred to a provider to establish care? No .   Dental Screening: Recommended annual dental exams for proper oral hygiene  Community Resource Referral / Chronic Care Management: CRR required this visit?  No   CCM required this visit?  No      Plan:     I have personally reviewed and noted the following in  the patient's chart:   Medical and social history Use of alcohol, tobacco or illicit  drugs  Current medications and supplements including opioid prescriptions. Patient is currently taking opioid prescriptions. Information provided to patient regarding non-opioid alternatives. Patient advised to discuss non-opioid treatment plan with their provider. Functional ability and status Nutritional status Physical activity Advanced directives List of other physicians Hospitalizations, surgeries, and ER visits in previous 12 months Vitals Screenings to include cognitive, depression, and falls Referrals and appointments  In addition, I have reviewed and discussed with patient certain preventive protocols, quality metrics, and best practice recommendations. A written personalized care plan for preventive services as well as general preventive health recommendations were provided to patient.     Shelda Altes, CMA   05/08/2021   Nurse Notes:

## 2021-05-08 NOTE — Telephone Encounter (Signed)
Patient came in the office requesting refill for pain medicine    HYDROcodone-acetaminophen (NORCO) 7.5-325 MG tablet  Pharmacy: [facility]

## 2021-05-09 MED ORDER — HYDROCODONE-ACETAMINOPHEN 7.5-325 MG PO TABS: ORAL_TABLET | ORAL | 0 refills | Status: DC

## 2021-05-13 ENCOUNTER — Ambulatory Visit: Payer: Medicare Other | Admitting: Family Medicine

## 2021-05-14 ENCOUNTER — Encounter: Payer: Self-pay | Admitting: Internal Medicine

## 2021-05-17 ENCOUNTER — Other Ambulatory Visit: Payer: Self-pay

## 2021-05-17 ENCOUNTER — Encounter: Payer: Self-pay | Admitting: Family Medicine

## 2021-05-17 ENCOUNTER — Ambulatory Visit (INDEPENDENT_AMBULATORY_CARE_PROVIDER_SITE_OTHER): Payer: Medicare Other | Admitting: Family Medicine

## 2021-05-17 ENCOUNTER — Ambulatory Visit (HOSPITAL_COMMUNITY)
Admission: RE | Admit: 2021-05-17 | Discharge: 2021-05-17 | Disposition: A | Discharge status: Home / Self Care | Payer: Medicare Other | Source: Ambulatory Visit | Attending: Family Medicine | Admitting: Family Medicine

## 2021-05-17 VITALS — BP 144/77 | HR 93 | Resp 16 | Ht 75.0 in | Wt 154.0 lb

## 2021-05-17 DIAGNOSIS — I714 Abdominal aortic aneurysm, without rupture, unspecified: Secondary | ICD-10-CM

## 2021-05-17 DIAGNOSIS — F17200 Nicotine dependence, unspecified, uncomplicated: Secondary | ICD-10-CM

## 2021-05-17 DIAGNOSIS — I2510 Atherosclerotic heart disease of native coronary artery without angina pectoris: Secondary | ICD-10-CM | POA: Diagnosis not present

## 2021-05-17 DIAGNOSIS — F17210 Nicotine dependence, cigarettes, uncomplicated: Secondary | ICD-10-CM | POA: Diagnosis not present

## 2021-05-17 DIAGNOSIS — R0789 Other chest pain: Secondary | ICD-10-CM

## 2021-05-17 DIAGNOSIS — I70213 Atherosclerosis of native arteries of extremities with intermittent claudication, bilateral legs: Secondary | ICD-10-CM

## 2021-05-17 DIAGNOSIS — E785 Hyperlipidemia, unspecified: Secondary | ICD-10-CM

## 2021-05-17 DIAGNOSIS — E559 Vitamin D deficiency, unspecified: Secondary | ICD-10-CM

## 2021-05-17 DIAGNOSIS — R972 Elevated prostate specific antigen [PSA]: Secondary | ICD-10-CM

## 2021-05-17 DIAGNOSIS — R5383 Other fatigue: Secondary | ICD-10-CM

## 2021-05-17 DIAGNOSIS — G8929 Other chronic pain: Secondary | ICD-10-CM

## 2021-05-17 DIAGNOSIS — Z1211 Encounter for screening for malignant neoplasm of colon: Secondary | ICD-10-CM

## 2021-05-17 DIAGNOSIS — M5442 Lumbago with sciatica, left side: Secondary | ICD-10-CM

## 2021-05-17 DIAGNOSIS — I10 Essential (primary) hypertension: Secondary | ICD-10-CM | POA: Diagnosis not present

## 2021-05-17 NOTE — Patient Instructions (Addendum)
F/U in office in 5 months, call if you need [place] sooner  Please get covid booster  Lab today, lipid, cmp and EGFR, CBC , tsh and vit D  Work on quitting smoking please.  Cologuard has already been ordered, please follow through  Please ensure you get your chest scan when due  CXR today due to 8 month c/o pain

## 2021-05-18 LAB — CBC WITH DIFFERENTIAL/PLATELET
Basophils Absolute: 0.1 10*3/uL (ref 0.0–0.2)
Basos: 1 %
EOS (ABSOLUTE): 0.3 10*3/uL (ref 0.0–0.4)
Eos: 4 %
Hematocrit: 37 % — ABNORMAL LOW (ref 37.5–51.0)
Hemoglobin: 12.9 g/dL — ABNORMAL LOW (ref 13.0–17.7)
Immature Grans (Abs): 0 10*3/uL (ref 0.0–0.1)
Immature Granulocytes: 0 %
Lymphocytes Absolute: 1.7 10*3/uL (ref 0.7–3.1)
Lymphs: 27 %
MCH: 31 pg (ref 26.6–33.0)
MCHC: 34.9 g/dL (ref 31.5–35.7)
MCV: 89 fL (ref 79–97)
Monocytes Absolute: 0.7 10*3/uL (ref 0.1–0.9)
Monocytes: 12 %
Neutrophils Absolute: 3.5 10*3/uL (ref 1.4–7.0)
Neutrophils: 56 %
Platelets: 369 10*3/uL (ref 150–450)
RBC: 4.16 x10E6/uL (ref 4.14–5.80)
RDW: 13.2 % (ref 11.6–15.4)
WBC: 6.3 10*3/uL (ref 3.4–10.8)

## 2021-05-18 LAB — CMP14+EGFR
ALT: 12 IU/L (ref 0–44)
AST: 28 IU/L (ref 0–40)
Albumin/Globulin Ratio: 1.7 (ref 1.2–2.2)
Albumin: 4.7 g/dL (ref 3.8–4.8)
Alkaline Phosphatase: 102 IU/L (ref 44–121)
BUN/Creatinine Ratio: 18 (ref 10–24)
BUN: 19 mg/dL (ref 8–27)
Bilirubin Total: 0.5 mg/dL (ref 0.0–1.2)
CO2: 20 mmol/L (ref 20–29)
Calcium: 9.7 mg/dL (ref 8.6–10.2)
Chloride: 96 mmol/L (ref 96–106)
Creatinine, Ser: 1.03 mg/dL (ref 0.76–1.27)
Globulin, Total: 2.7 g/dL (ref 1.5–4.5)
Glucose: 86 mg/dL (ref 65–99)
Potassium: 4.7 mmol/L (ref 3.5–5.2)
Sodium: 137 mmol/L (ref 134–144)
Total Protein: 7.4 g/dL (ref 6.0–8.5)
eGFR: 79 mL/min/{1.73_m2} (ref >59)

## 2021-05-18 LAB — VITAMIN D 25 HYDROXY (VIT D DEFICIENCY, FRACTURES): Vit D, 25-Hydroxy: 25 ng/mL — ABNORMAL LOW (ref 30.0–100.0)

## 2021-05-18 LAB — LIPID PANEL
Chol/HDL Ratio: 2.7 ratio (ref 0.0–5.0)
Cholesterol, Total: 206 mg/dL — ABNORMAL HIGH (ref 100–199)
HDL: 75 mg/dL (ref >39)
LDL Chol Calc (NIH): 118 mg/dL — ABNORMAL HIGH (ref 0–99)
Triglycerides: 74 mg/dL (ref 0–149)
VLDL Cholesterol Cal: 13 mg/dL (ref 5–40)

## 2021-05-18 LAB — TSH: TSH: 1.3 u[IU]/mL (ref 0.450–4.500)

## 2021-05-19 ENCOUNTER — Encounter: Payer: Self-pay | Admitting: Family Medicine

## 2021-05-19 DIAGNOSIS — R079 Chest pain, unspecified: Secondary | ICD-10-CM | POA: Insufficient documentation

## 2021-05-19 MED ORDER — ATORVASTATIN CALCIUM 80 MG PO TABS
80.0000 mg | ORAL_TABLET | Freq: Every day | ORAL | 3 refills | Status: DC
Start: 2021-05-19 — End: 2021-07-16

## 2021-05-19 NOTE — Assessment & Plan Note (Signed)
Hyperlipidemia:Low fat diet discussed and encouraged.   Lipid Panel  Lab Results  Component Value Date   CHOL 206 (H) 05/17/2021   HDL 75 05/17/2021   LDLCALC 118 (H) 05/17/2021   TRIG 74 05/17/2021   CHOLHDL 2.7 05/17/2021     Uncontrolled, needs to reduce dietary  fat, will re eval

## 2021-05-19 NOTE — Assessment & Plan Note (Signed)
Followed by Urology 

## 2021-05-19 NOTE — Assessment & Plan Note (Signed)
Managed by Ortho, chronic pain meds

## 2021-05-19 NOTE — Assessment & Plan Note (Signed)
Asked:confirms currently smokes cigarettes °Assess: Unwilling to set a quit date, but is cutting back °Advise: needs to QUIT to reduce risk of cancer, cardio and cerebrovascular disease °Assist: counseled for 5 minutes and literature provided °Arrange: follow up in 2 to 4 months ° °

## 2021-05-19 NOTE — Assessment & Plan Note (Signed)
Overdue, encouraged to  Send in cologuard specimen

## 2021-05-19 NOTE — Assessment & Plan Note (Signed)
Elevated at visit, needs to lower procesed foods, reeval at next visit DASH diet and commitment to daily physical activity for a minimum of 30 minutes discussed and encouraged, as a part of hypertension management. The importance of attaining a healthy weight is also discussed.  BP/Weight 05/17/2021 04/16/2021 04/08/2021 02/20/2021 02/14/2021 01/03/2021 [id number]  Systolic BP [id number]  Diastolic BP 77 78 82 83 90 85 83  Wt. (Lbs) 154 155 157 153.8 157 157.38 156  BMI 19.25 19.63 19.89 19.48 19.89 19.94 19.76

## 2021-05-19 NOTE — Progress Notes (Signed)
JACKMAN CARRAHER     MRN: [id number]      DOB: 1951/10/09   HPI Mr. Parrow is here for follow up and re-evaluation of chronic medical conditions, medication management and review of any available recent lab and radiology data.  Preventive health is updated, specifically  Cancer screening and Immunization.   Questions or concerns regarding consultations or procedures which the PT has had in the interim are  addressed. The PT denies any adverse reactions to current medications since the last visit.  8 month h/o right rib pain  which he " massages away"Denies, nausea, difficult or painful breathing. ROS Denies recent fever or chills. Denies sinus pressure, nasal congestion, ear pain or sore throat. Denies chest congestion, productive cough or wheezing. Denies  palpitations and leg swelling Denies abdominal pain, nausea, vomiting,diarrhea or constipation.   Denies dysuria, frequency, hesitancy or incontinence. Denies uncontrolled  joint pain, swelling and limitation in mobility. Denies headaches, seizures, numbness, or tingling. Denies depression, anxiety or insomnia. Denies skin break down or rash.   PE  BP (!) 144/77   Pulse 93   Resp 16   Ht '6\' 3"'$  (1.905 m)   Wt 154 lb (69.9 kg)   SpO2 94%   BMI 19.25 kg/m   Patient alert and oriented and in no cardiopulmonary distress.  HEENT: No facial asymmetry, EOMI,     Neck supple .  Chest: Clear to auscultation bilaterally.No reproducible chest wall pain, decreased air entry throughout  CVS: S1, S2 no murmurs, no S3.Regular rate.  ABD: Soft non tender.   Ext: No edema  MS: Adequate though reduced ROM spine, shoulders, hips and knees.  Skin: Intact, no ulcerations or rash noted.  Psych: Good eye contact, normal affect. Memory intact not anxious or depressed appearing.  CNS: CN 2-12 intact, power,  normal throughout.no focal deficits noted.   Assessment & Plan  Essential hypertension Elevated at visit, needs to lower  procesed foods, reeval at next visit DASH diet and commitment to daily physical activity for a minimum of 30 minutes discussed and encouraged, as a part of hypertension management. The importance of attaining a healthy weight is also discussed.  BP/Weight 05/17/2021 04/16/2021 04/08/2021 02/20/2021 02/14/2021 01/03/2021 [id number]  Systolic BP 123456 [id number] [id number] 0000000  Diastolic BP 77 78 82 83 90 85 83  Wt. (Lbs) 154 155 157 153.8 157 157.38 156  BMI 19.25 19.63 19.89 19.48 19.89 19.94 19.76       Elevated PSA Followed by Urology  NICOTINE ADDICTION Asked:confirms currently smokes cigarettes Assess: Unwilling to set a quit date, but is cutting back Advise: needs to QUIT to reduce risk of cancer, cardio and cerebrovascular disease Assist: counseled for 5 minutes and literature provided Arrange: follow up in 2 to 4 months'  Screening for colon cancer Overdue, encouraged to  Send in cologuard specimen  Low back pain with left-sided sciatica Managed by Ortho, chronic pain meds  Hyperlipidemia LDL goal <70 Hyperlipidemia:Low fat diet discussed and encouraged.   Lipid Panel  Lab Results  Component Value Date   CHOL 206 (H) 05/17/2021   HDL 75 05/17/2021   LDLCALC 118 (H) 05/17/2021   TRIG 74 05/17/2021   CHOLHDL 2.7 05/17/2021     Uncontrolled, needs to reduce dietary  fat, will re eval  Chest pain RIGHT RIB PAIN , NOT ASSOCIATED WITH BREATHING OR ACTIVITY, X RAY TO FURTHER EVALUATE  Abdominal aortic aneurysm (AAA) without rupture ([place]) evauated in 01/2021 by vascular, no change,  work on [employer] modification and annual f/u

## 2021-05-19 NOTE — Assessment & Plan Note (Signed)
RIGHT RIB PAIN , NOT ASSOCIATED WITH BREATHING OR ACTIVITY, X RAY TO FURTHER EVALUATE

## 2021-05-19 NOTE — Assessment & Plan Note (Signed)
evauated in 01/2021 by vascular, no change, work on [employer] modification and annual f/u

## 2021-05-22 LAB — IRON: Iron: 59 ug/dL (ref 38–169)

## 2021-05-22 LAB — FERRITIN: Ferritin: 55 ng/mL (ref 30–400)

## 2021-05-22 LAB — SPECIMEN STATUS REPORT

## 2021-06-10 ENCOUNTER — Telehealth: Payer: Self-pay | Admitting: Orthopaedic Surgery

## 2021-06-10 MED ORDER — HYDROCODONE-ACETAMINOPHEN 7.5-325 MG PO TABS: ORAL_TABLET | ORAL | 0 refills | Status: DC

## 2021-06-10 NOTE — Telephone Encounter (Signed)
Patient requests refill: HYDROcodone-acetaminophen (NORCO) 7.5-325 MG tablet 110 tablet     [facility], [address]

## 2021-07-10 ENCOUNTER — Telehealth: Payer: Self-pay | Admitting: Orthopaedic Surgery

## 2021-07-11 MED ORDER — HYDROCODONE-ACETAMINOPHEN 7.5-325 MG PO TABS
ORAL_TABLET | ORAL | 0 refills | Status: DC
Start: 2021-07-11 — End: 2021-08-07

## 2021-07-16 ENCOUNTER — Encounter: Payer: Self-pay | Admitting: Orthopaedic Surgery

## 2021-07-16 ENCOUNTER — Ambulatory Visit (INDEPENDENT_AMBULATORY_CARE_PROVIDER_SITE_OTHER): Payer: Medicare Other | Admitting: Orthopaedic Surgery

## 2021-07-16 ENCOUNTER — Other Ambulatory Visit: Payer: Self-pay

## 2021-07-16 VITALS — BP 139/80 | HR 88 | Ht 74.5 in | Wt 156.4 lb

## 2021-07-16 DIAGNOSIS — I70213 Atherosclerosis of native arteries of extremities with intermittent claudication, bilateral legs: Secondary | ICD-10-CM

## 2021-07-16 DIAGNOSIS — F17210 Nicotine dependence, cigarettes, uncomplicated: Secondary | ICD-10-CM

## 2021-07-16 DIAGNOSIS — M5442 Lumbago with sciatica, left side: Secondary | ICD-10-CM

## 2021-07-16 DIAGNOSIS — G8929 Other chronic pain: Secondary | ICD-10-CM

## 2021-07-16 MED ORDER — CYCLOBENZAPRINE HCL 10 MG PO TABS
ORAL_TABLET | ORAL | 3 refills | Status: DC
Start: 2021-07-16 — End: 2021-10-15

## 2021-07-16 NOTE — Progress Notes (Signed)
I still hurt  He has chronic lower back pain.  He has had more bad days recently.  He has no weakness.  He is taking his medicine and doing his exercises.  He is out of Flexeril and I will refill.  Spine/Pelvis examination:  Inspection:  Overall, sacoiliac joint benign and hips nontender; without crepitus or defects.   Thoracic spine inspection: Alignment normal without kyphosis present   Lumbar spine inspection:  Alignment  with normal lumbar lordosis, without scoliosis apparent.   Thoracic spine palpation:  without tenderness of spinal processes   Lumbar spine palpation: without tenderness of lumbar area; without tightness of lumbar muscles    Range of Motion:   Lumbar flexion, forward flexion is normal without pain or tenderness    Lumbar extension is full without pain or tenderness   Left lateral bend is normal without pain or tenderness   Right lateral bend is normal without pain or tenderness   Straight leg raising is normal  Strength & tone: normal   Stability overall normal stability  Encounter Diagnoses  Name Primary?   Chronic midline low back pain with left-sided sciatica Yes   Cigarette nicotine dependence without complication    Return in three months.  Call if any problem.  Precautions discussed.  Electronically Signed Sanjuana Kava, MD 9/20/20228:09 AM

## 2021-08-01 ENCOUNTER — Other Ambulatory Visit: Payer: Self-pay | Admitting: Family Medicine

## 2021-08-07 ENCOUNTER — Telehealth: Payer: Self-pay | Admitting: Orthopaedic Surgery

## 2021-08-08 MED ORDER — HYDROCODONE-ACETAMINOPHEN 7.5-325 MG PO TABS: ORAL_TABLET | ORAL | 0 refills | Status: DC

## 2021-08-22 ENCOUNTER — Other Ambulatory Visit: Payer: Self-pay | Admitting: Family Medicine

## 2021-08-22 DIAGNOSIS — I10 Essential (primary) hypertension: Secondary | ICD-10-CM

## 2021-09-09 ENCOUNTER — Telehealth: Payer: Self-pay | Admitting: Orthopaedic Surgery

## 2021-09-09 MED ORDER — HYDROCODONE-ACETAMINOPHEN 7.5-325 MG PO TABS: ORAL_TABLET | ORAL | 0 refills | Status: DC

## 2021-09-14 NOTE — Progress Notes (Signed)
Referring Provider: Fayrene Helper, MD Primary Care Physician:  Fayrene Helper, MD Primary GI Physician: Dr. Abbey Chatters  Chief Complaint  Patient presents with   Colonoscopy    Never had tcs. No fhcrc   Abdominal Pain    Feels like cramp at times right abd    HPI:   Dillon [name] is a 70 y.o. male presenting today at the request of Dr. Moshe Cipro for consult colonoscopy.  Patient was previously seen in our office in February 2020 to discuss scheduling his first-ever colonoscopy.  No significant GI symptoms. He did report intermittent RLQ discomfort worse with coughing and feels like a cramp. He was scheduled for colonoscopy, but later called to cancel.  Today: No prior colonoscopy. No family history of colon cancer. No brbpr or melena. Mild constipation previously, now taking stool softener once a week which is working well. No diarrhea. No unintentional weight loss.   No N/V, GERD, or dysphagia.   Occasional RLQ discomfort. Not affected by BMs or meals. Feels like a cramp, massages it for about 1 minutes and symptoms resolve. Occurs when he coughs, bends a certain way, with activity, going from sitting to standing. Occurs daily to a few times a day. Has been present for several years and would like to know what it is.   Occasional use of Aleve for back pain. Has to use Hydrocodone 2-3 days a week. Meloxicam every other day.   Chronic SOB with exertion. No cough. No CP or palpitation.   Past Medical History:  Diagnosis Date   Arthritis    2012   Asthma    childhood   COPD (chronic obstructive pulmonary disease) ([place])    Glaucoma    Hyperlipidemia    1990   Hypertension    1990   Nicotine addiction    PAD (peripheral artery disease) ([place]) 2015    Past Surgical History:  Procedure Laterality Date   KNEE ARTHROSCOPY     both knees    LOWER EXTREMITY ANGIOGRAPHY N/A 02/04/2018   Procedure: LOWER EXTREMITY ANGIOGRAPHY;  Surgeon: Conrad [place], MD;  Location: [facility];  Service: Cardiovascular;  Laterality: N/A;    Current Outpatient Medications  Medication Sig Dispense Refill   ALPHAGAN P 0.1 % SOLN SMARTSIG:1 Drop(s) In Eye(s) Morning-Evening     amLODipine (NORVASC) 10 MG tablet TAKE 1 TABLET BY MOUTH EVERY DAY 90 tablet 1   aspirin EC 81 MG tablet Take 81 mg by mouth daily.     atorvastatin (LIPITOR) 80 MG tablet TAKE 1 TABLET BY MOUTH EVERY DAY 30 tablet 0   BREO ELLIPTA 200-25 MCG/INH AEPB INHALE 1 PUFF BY MOUTH EVERY DAY 60 each 5   cyclobenzaprine (FLEXERIL) 10 MG tablet TAKE 1 TABLET (10 MG TOTAL) BY MOUTH AT BEDTIME AS NEEDED FOR SPASM. 30 tablet 3   gabapentin (NEURONTIN) 300 MG capsule TAKE 1 CAPSULE (300 MG TOTAL) BY MOUTH AT BEDTIME. 30 capsule 2   HYDROcodone-acetaminophen (NORCO) 7.5-325 MG tablet One tablet every six hours as need for pain.  Must last 28 days. 110 tablet 0   ipratropium (ATROVENT) 0.02 % nebulizer solution Take 2.5 mLs (0.5 mg total) by nebulization every 6 (six) hours as needed for wheezing or shortness of breath. 75 mL 12   latanoprost (XALATAN) 0.005 % ophthalmic solution 1 drop daily.     levalbuterol (XOPENEX) 0.63 MG/3ML nebulizer solution Take 3 mLs (0.63 mg total) by nebulization every 8 (eight) hours as needed  for wheezing or shortness of breath. 3 mL 12   meloxicam (MOBIC) 15 MG tablet TAKE 1 TABLET BY MOUTH ONCE DAILY AS NEEDED FOR UNCONTROLLED ARTHRITIS PAIN 90 tablet 0   RHOPRESSA 0.02 % SOLN Place 1 drop into the right eye at bedtime.     tiZANidine (ZANAFLEX) 4 MG tablet TAKE 1 TABLET (4 MG TOTAL) BY MOUTH EVERY 8 (EIGHT) HOURS AS NEEDED FOR MUSCLE SPASMS 60 tablet 3   UNABLE TO FIND Nebulizer facemask and tubing x 1  DX J44.9 1 each 0   Vitamin D, Ergocalciferol, (DRISDOL) 1.25 MG (50000 UNIT) CAPS capsule TAKE 1 CAPSULE (50,000 UNITS TOTAL) BY MOUTH ONCE A WEEK. ONE CAPSULE ONCE WEEKLY 12 capsule 2   spironolactone (ALDACTONE) 50 MG tablet TAKE 1 TABLET BY MOUTH EVERY DAY (Patient not taking:  Reported on 09/16/2021) 90 tablet 1   No current facility-administered medications for this visit.    Allergies as of 09/16/2021 - Review Complete 09/16/2021  Allergen Reaction Noted   Penicillins Hives and Other (See Comments) 02/24/2008    Family History  Problem Relation Age of Onset   Heart attack Mother    Diabetes Mother    Hypertension Mother    Stroke Brother    Hypertension Sister    Hypertension Sister    Hypertension Sister    Hypertension Brother    Colon cancer Neg Hx     Social History   Socioeconomic History   Marital status: Single    Spouse name: Not on file   Number of children: Not on file   Years of education: Not on file   Highest education level: Not on file  Occupational History   Not on file  Tobacco Use   Smoking status: Every Day    Years: 48.00    Types: Cigarettes    Start date: 10/16/1969   Smokeless tobacco: Never  Vaping Use   Vaping Use: Never used  Substance and Sexual Activity   Alcohol use: Yes    Comment: occassional, max of 72 oz pwr month   Drug use: No   Sexual activity: Yes    Partners: Female  Other Topics Concern   Not on file  Social History Narrative   Not on file   Social Determinants of Health   Financial Resource Strain: Low Risk    Difficulty of Paying Living Expenses: Not hard at all  Food Insecurity: No Food Insecurity   Worried About Charity fundraiser in the Last Year: Never true   [place] in the Last Year: Never true  Transportation Needs: No Transportation Needs   Lack of Transportation (Medical): No   Lack of Transportation (Non-Medical): No  Physical Activity: Sufficiently Active   Days of Exercise per Week: 4 days   Minutes of Exercise per Session: 60 min  Stress: No Stress Concern Present   Feeling of Stress : Not at all  Social Connections: Moderately Isolated   Frequency of Communication with Friends and Family: More than three times a week   Frequency of Social Gatherings with  Friends and Family: More than three times a week   Attends Religious Services: 1 to 4 times per year   Active Member of [employer] or Organizations: No   Attends Archivist Meetings: Never   Marital Status: Never married    Review of Systems: Gen: Denies fever, chills, cold or flulike symptoms, presyncope or syncope. CV: Denies chest pain, palpitations Resp: Admits to chronic exertional dyspnea,  denies cough. GI: See HPI Heme: See HPI  Physical Exam: BP (!) 148/80   Pulse 91   Temp (!) 97.5 F (36.4 C) (Temporal)   Ht 6\' 3"  (1.905 m)   Wt 159 lb (72.1 kg)   BMI 19.87 kg/m  General:   Alert and oriented. No distress noted. Pleasant and cooperative.  Head:  Normocephalic and atraumatic. Eyes:  Conjuctiva clear without scleral icterus. Heart:  S1, S2 present without murmurs appreciated. Lungs:  Clear to auscultation bilaterally. No wheezes, rales, or rhonchi. No distress.  Abdomen:  +BS, soft, non-tender and non-distended. No rebound or guarding. No HSM or masses noted. Msk:  Symmetrical without gross deformities. Normal posture. Extremities:  Without edema. Neurologic:  Alert and  oriented x4 Psych:  Normal mood and affect.    Assessment: 70 year old male with history of COPD, glaucoma, arthritis, HTN, HLD, PVD, chronic tobacco use, presenting today to discuss scheduling first-ever colonoscopy and also reporting chronic intermittent RLQ abdominal pain.  Colon cancer screening:  Overdue.  No significant lower GI symptoms or alarm symptoms.  No family history of colon cancer.  Chronic RLQ abdominal pain: Intermittent and associated with certain movements/activity.  Feels like a cramp and resolves with massaging.  Present for several years and occurring a few times a day at this point.  Abdominal exam is benign today.  Suspect MSK etiology versus small hernia.  Patient is frustrated with this and would like further evaluation.  Reviewed prior abdominal ultrasound in 2021  with nothing to explain RLQ pain.  We will plan for CT A/P with contrast to evaluate further.   Plan: CT A/P with contrast. Colonoscopy with propofol with Dr. Abbey Chatters in the near future. The risks, benefits, and alternatives have been discussed with the patient in detail. The patient states understanding and desires to proceed. ASA 3 Follow-up per Dr. Abbey Chatters.   Aliene Altes, PA-C [facility] 09/16/2021

## 2021-09-16 ENCOUNTER — Encounter: Payer: Self-pay | Admitting: Gastroenterology

## 2021-09-16 ENCOUNTER — Ambulatory Visit (INDEPENDENT_AMBULATORY_CARE_PROVIDER_SITE_OTHER): Payer: Medicare Other | Admitting: Gastroenterology

## 2021-09-16 ENCOUNTER — Other Ambulatory Visit: Payer: Self-pay

## 2021-09-16 VITALS — BP 148/80 | HR 91 | Temp 97.5°F | Ht 75.0 in | Wt 159.0 lb

## 2021-09-16 DIAGNOSIS — Z1211 Encounter for screening for malignant neoplasm of colon: Secondary | ICD-10-CM | POA: Diagnosis not present

## 2021-09-16 DIAGNOSIS — G8929 Other chronic pain: Secondary | ICD-10-CM

## 2021-09-16 DIAGNOSIS — R1031 Right lower quadrant pain: Secondary | ICD-10-CM

## 2021-09-16 MED ORDER — PEG 3350-KCL-NA BICARB-NACL 420 G PO SOLR: 4000.0000 mL | ORAL | 0 refills | Status: DC

## 2021-09-16 NOTE — Patient Instructions (Signed)
We will arrange for you to have a CT scan of your abdomen pelvis in the near future at [facility] to evaluate the pain in your right lower abdomen.  We will also arrange for you to have a colonoscopy with Dr. Abbey Chatters in the near future.  We will plan to see you back in the office as Dr. Abbey Chatters recommends.  Do not hesitate to call if you have any questions or concerns.  It was a pleasure meeting you today!  I hope you have a very happy Thanksgiving!  Aliene Altes, PA-C [facility]

## 2021-09-30 ENCOUNTER — Other Ambulatory Visit: Payer: Medicare Other

## 2021-09-30 ENCOUNTER — Other Ambulatory Visit: Payer: Self-pay

## 2021-09-30 DIAGNOSIS — R972 Elevated prostate specific antigen [PSA]: Secondary | ICD-10-CM

## 2021-10-01 LAB — PSA, TOTAL AND FREE
PSA, Free Pct: 19 %
PSA, Free: 2.11 ng/mL
Prostate Specific Ag, Serum: 11.1 ng/mL — ABNORMAL HIGH (ref 0.0–4.0)

## 2021-10-07 ENCOUNTER — Ambulatory Visit: Payer: Medicare Other | Admitting: Urology

## 2021-10-07 ENCOUNTER — Telehealth: Payer: Self-pay

## 2021-10-07 ENCOUNTER — Telehealth: Payer: Self-pay | Admitting: Orthopaedic Surgery

## 2021-10-07 MED ORDER — HYDROCODONE-ACETAMINOPHEN 7.5-325 MG PO TABS
ORAL_TABLET | ORAL | 0 refills | Status: DC
Start: 2021-10-07 — End: 2021-11-10

## 2021-10-07 NOTE — Telephone Encounter (Signed)
-----   Message from Josue Hector sent at 10/04/2021  1:17 PM EST ----- This patient is scheduled for 12/15.  He wants to reschedule.  I told him to call your office and LM.  I put him in the depot.  Just let me know where to reschedule him to.  Thanks,

## 2021-10-07 NOTE — Telephone Encounter (Signed)
Patient is requesting a refill on his pain medicine    HYDROcodone-acetaminophen (NORCO) 7.5-325 MG tablet  [facility] [place]

## 2021-10-07 NOTE — Telephone Encounter (Signed)
Called pt, he wants to wait until January to do TCS. Advised him we will call back when January schedule is available to reschedule procedure.

## 2021-10-08 ENCOUNTER — Encounter (HOSPITAL_COMMUNITY): Payer: Medicare Other

## 2021-10-10 NOTE — Telephone Encounter (Signed)
Called pt. He has been rescheduled to 1/9 at 11:00am. Aware will mail new prep instructions with pre-op appt. Reports already has prep at home.

## 2021-10-11 ENCOUNTER — Encounter: Payer: Self-pay | Admitting: *Deleted

## 2021-10-15 ENCOUNTER — Encounter: Payer: Self-pay | Admitting: Orthopaedic Surgery

## 2021-10-15 ENCOUNTER — Other Ambulatory Visit: Payer: Self-pay

## 2021-10-15 ENCOUNTER — Ambulatory Visit (INDEPENDENT_AMBULATORY_CARE_PROVIDER_SITE_OTHER): Payer: Medicare Other | Admitting: Orthopaedic Surgery

## 2021-10-15 VITALS — BP 146/75 | HR 88 | Ht 74.5 in | Wt 157.0 lb

## 2021-10-15 DIAGNOSIS — M5442 Lumbago with sciatica, left side: Secondary | ICD-10-CM

## 2021-10-15 DIAGNOSIS — I70213 Atherosclerosis of native arteries of extremities with intermittent claudication, bilateral legs: Secondary | ICD-10-CM | POA: Diagnosis not present

## 2021-10-15 DIAGNOSIS — G8929 Other chronic pain: Secondary | ICD-10-CM | POA: Diagnosis not present

## 2021-10-15 DIAGNOSIS — F17210 Nicotine dependence, cigarettes, uncomplicated: Secondary | ICD-10-CM | POA: Diagnosis not present

## 2021-10-15 MED ORDER — CYCLOBENZAPRINE HCL 10 MG PO TABS: ORAL_TABLET | ORAL | 3 refills | Status: DC

## 2021-10-15 NOTE — Progress Notes (Signed)
The cold weather has made me worse.  His back pain is increased recently.  He has no new trauma, no weakness, no numbness.  He is taking his medicine and doing his exercises.  Spine/Pelvis examination:  Inspection:  Overall, sacoiliac joint benign and hips nontender; without crepitus or defects.   Thoracic spine inspection: Alignment normal without kyphosis present   Lumbar spine inspection:  Alignment  with normal lumbar lordosis, without scoliosis apparent.   Thoracic spine palpation:  without tenderness of spinal processes   Lumbar spine palpation: without tenderness of lumbar area; without tightness of lumbar muscles    Range of Motion:   Lumbar flexion, forward flexion is normal without pain or tenderness    Lumbar extension is full without pain or tenderness   Left lateral bend is normal without pain or tenderness   Right lateral bend is normal without pain or tenderness   Straight leg raising is normal  Strength & tone: normal   Stability overall normal stability  Encounter Diagnoses  Name Primary?   Chronic midline low back pain with left-sided sciatica Yes   Cigarette nicotine dependence without complication    I will see him in three months.  I have refilled the Flexeril.  Call if any problem.  Precautions discussed.  Electronically Signed Sanjuana Kava, MD 12/20/20228:02 AM

## 2021-10-17 ENCOUNTER — Other Ambulatory Visit: Payer: Self-pay

## 2021-10-17 ENCOUNTER — Ambulatory Visit (INDEPENDENT_AMBULATORY_CARE_PROVIDER_SITE_OTHER): Payer: Medicare Other | Admitting: Family Medicine

## 2021-10-17 ENCOUNTER — Encounter: Payer: Self-pay | Admitting: Family Medicine

## 2021-10-17 VITALS — BP 138/82 | HR 84 | Resp 14 | Ht 74.5 in | Wt 158.1 lb

## 2021-10-17 DIAGNOSIS — F17200 Nicotine dependence, unspecified, uncomplicated: Secondary | ICD-10-CM | POA: Diagnosis not present

## 2021-10-17 DIAGNOSIS — I10 Essential (primary) hypertension: Secondary | ICD-10-CM | POA: Diagnosis not present

## 2021-10-17 DIAGNOSIS — F17210 Nicotine dependence, cigarettes, uncomplicated: Secondary | ICD-10-CM | POA: Diagnosis not present

## 2021-10-17 DIAGNOSIS — E785 Hyperlipidemia, unspecified: Secondary | ICD-10-CM

## 2021-10-17 DIAGNOSIS — M545 Low back pain, unspecified: Secondary | ICD-10-CM | POA: Diagnosis not present

## 2021-10-17 DIAGNOSIS — M549 Dorsalgia, unspecified: Secondary | ICD-10-CM | POA: Insufficient documentation

## 2021-10-17 DIAGNOSIS — I70213 Atherosclerosis of native arteries of extremities with intermittent claudication, bilateral legs: Secondary | ICD-10-CM

## 2021-10-17 MED ORDER — FAMOTIDINE 40 MG PO TABS: 40.0000 mg | ORAL_TABLET | Freq: Every day | ORAL | 0 refills | Status: DC

## 2021-10-17 MED ORDER — PREDNISONE 20 MG PO TABS: ORAL_TABLET | ORAL | 0 refills | Status: DC

## 2021-10-17 NOTE — Assessment & Plan Note (Signed)
Uncontrolled.Toradol and depo medrol administered IM in the office , to be followed by a short course of oral prednisone   

## 2021-10-17 NOTE — Patient Instructions (Signed)
F/U in mid Feb, call if you need me before  Toradol 60 mg and depo medrol 80 mg iM in office today followed by a short course of prednisone and pepcid  Please work on stopping smoking  Best for 2023!

## 2021-10-21 ENCOUNTER — Encounter: Payer: Self-pay | Admitting: Family Medicine

## 2021-10-21 NOTE — Assessment & Plan Note (Signed)
Asked:confirms currently smokes cigarettes °Assess: Unwilling to set a quit date, but is cutting back °Advise: needs to QUIT to reduce risk of cancer, cardio and cerebrovascular disease °Assist: counseled for 5 minutes and literature provided °Arrange: follow up in 2 to 4 months ° °

## 2021-10-21 NOTE — Assessment & Plan Note (Signed)
DASH diet and commitment to daily physical activity for a minimum of 30 minutes discussed and encouraged, as a part of hypertension management. The importance of attaining a healthy weight is also discussed.  BP/Weight 10/17/2021 10/15/2021 09/16/2021 07/16/2021 05/17/2021 04/16/2021 4/35/6861  Systolic BP 683 729 021 115 520 802 233  Diastolic BP 82 75 80 80 77 78 82  Wt. (Lbs) 158.12 157 159 156.4 154 155 157  BMI 20.03 19.89 19.87 19.81 19.25 19.63 19.89     ,con1

## 2021-10-21 NOTE — Assessment & Plan Note (Signed)
Hyperlipidemia:Low fat diet discussed and encouraged.   Lipid Panel  Lab Results  Component Value Date   CHOL 206 (H) 05/17/2021   HDL 75 05/17/2021   LDLCALC 118 (H) 05/17/2021   TRIG 74 05/17/2021   CHOLHDL 2.7 05/17/2021   uncontorlled , needs to lower fat intake Updated lab needed at/ before next visit.

## 2021-10-21 NOTE — Progress Notes (Signed)
° °  Dillon [name]     MRN: [id number]      DOB: [birth year]   HPI Dillon [name] is here for follow up and re-evaluation of chronic medical conditions, medication management and review of any available recent lab and radiology data.  Preventive health is updated, specifically  Cancer screening and Immunization.   Questions or concerns regarding consultations or procedures which the PT has had in the interim are  addressed. The PT denies any adverse reactions to current medications since the last visit.  2 day h/o severe and uncontrolled low back pain, no new lower  ext weakness, numbness or incontinence, no specific aggravating factor ROS Denies recent fever or chills. Denies sinus pressure, nasal congestion, ear pain or sore throat. Denies chest congestion, productive cough or wheezing. Denies chest pains, palpitations and leg swelling Denies abdominal pain, nausea, vomiting,diarrhea or constipation.   Denies dysuria, frequency, hesitancy or incontinence. . Denies depression, anxiety or insomnia. Denies skin break down or rash.   PE  BP 138/82    Pulse 84    Resp 14    Ht 6' 2.5" (1.892 m)    Wt 158 lb 1.9 oz (71.7 kg)    SpO2 95%    BMI 20.03 kg/m   Patient alert and oriented and in no cardiopulmonary distress.Pt in pain  HEENT: No facial asymmetry, EOMI,     Neck supple .  Chest: Clear to auscultation bilaterally.Decreased air entry  CVS: S1, S2 no murmurs, no S3.Regular rate.  ABD: Soft non tender.   Ext: No edema  MS: decreased  ROM spine, adequate in shoulders, hips and knees.  Skin: Intact, no ulcerations or rash noted.  Psych: Good eye contact, normal affect. Memory intact not anxious or depressed appearing.  CNS: CN 2-12 intact, power,  normal throughout.no focal deficits noted.   Assessment & Plan  Back pain Uncontrolled.Toradol and depo medrol administered IM in the office , to be followed by a short course of oral prednisone .   Essential hypertension DASH  diet and commitment to daily physical activity for a minimum of 30 minutes discussed and encouraged, as a part of hypertension management. The importance of attaining a healthy weight is also discussed.  BP/Weight 10/17/2021 10/15/2021 09/16/2021 07/16/2021 05/17/2021 04/16/2021 8/76/8115  Systolic BP 726 203 559 741 638 453 646  Diastolic BP 82 75 80 80 77 78 82  Wt. (Lbs) 158.12 157 159 156.4 154 155 157  BMI 20.03 19.89 19.87 19.81 19.25 19.63 19.89     ,con1   NICOTINE ADDICTION Asked:confirms currently smokes cigarettes Assess: Unwilling to set a quit date, but is cutting back Advise: needs to QUIT to reduce risk of cancer, cardio and cerebrovascular disease Assist: counseled for 5 minutes and literature provided Arrange: follow up in 2 to 4 months   Hyperlipidemia LDL goal <70 Hyperlipidemia:Low fat diet discussed and encouraged.   Lipid Panel  Lab Results  Component Value Date   CHOL 206 (H) 05/17/2021   HDL 75 05/17/2021   LDLCALC 118 (H) 05/17/2021   TRIG 74 05/17/2021   CHOLHDL 2.7 05/17/2021   uncontorlled , needs to lower fat intake Updated lab needed at/ before next visit.

## 2021-10-23 LAB — CREATININE, SERUM: Creat: 1.01 mg/dL (ref 0.70–1.35)

## 2021-10-24 ENCOUNTER — Other Ambulatory Visit: Payer: Self-pay

## 2021-10-24 ENCOUNTER — Ambulatory Visit (HOSPITAL_COMMUNITY)
Admission: RE | Admit: 2021-10-24 | Discharge: 2021-10-24 | Disposition: A | Discharge status: Home / Self Care | Payer: Medicare Other | Source: Ambulatory Visit | Attending: Gastroenterology | Admitting: Gastroenterology

## 2021-10-24 DIAGNOSIS — R1031 Right lower quadrant pain: Secondary | ICD-10-CM | POA: Insufficient documentation

## 2021-10-24 DIAGNOSIS — G8929 Other chronic pain: Secondary | ICD-10-CM | POA: Diagnosis present

## 2021-10-24 MED ORDER — IOHEXOL 300 MG/ML  SOLN
100.0000 mL | Freq: Once | INTRAMUSCULAR | Status: AC | PRN
  Administered 2021-10-24: 11:00:00 100 mL via INTRAVENOUS

## 2021-10-29 NOTE — Patient Instructions (Signed)
Dillon [name]  10/29/2021     @PREFPERIOPPHARMACY @   Your procedure is scheduled on  11/04/2021.   Report to [facility] at  0900 A.M.   Call this number if you have problems the morning of surgery:  [phone]   Remember:  Follow the diet and prep instructions given to you by the office.    Take these medicines the morning of surgery with A SIP OF WATER     amlodipine, flexeril(if needed), pepcid, hydrocodone (if needed).     Do not wear jewelry, make-up or nail polish.  Do not wear lotions, powders, or perfumes, or deodorant.  Do not shave 48 hours prior to surgery.  Men may shave face and neck.  Do not bring valuables to the hospital.  [facility] is not responsible for any belongings or valuables.  Contacts, dentures or bridgework may not be worn into surgery.  Leave your suitcase in the car.  After surgery it may be brought to your room.  For patients admitted to the hospital, discharge time will be determined by your treatment team.  Patients discharged the day of surgery will not be allowed to drive home and must have someone with them for 24 hours.    Special instructions:   DO NOT smoke tobacco or vape for 24 hours before your procedure.  Please read over the following fact sheets that you were given. Anesthesia Post-op Instructions and Care and Recovery After Surgery      Colonoscopy, Adult, Care After This sheet gives you information about how to care for yourself after your procedure. Your health care provider may also give you more specific instructions. If you have problems or questions, contact your health care provider. What can I expect after the procedure? After the procedure, it is common to have: A small amount of blood in your stool for 24 hours after the procedure. Some gas. Mild cramping or bloating of your abdomen. Follow these instructions at home: Eating and drinking  Drink enough fluid to keep your urine pale yellow. Follow  instructions from your health care provider about eating or drinking restrictions. Resume your normal diet as instructed by your health care provider. Avoid heavy or fried foods that are hard to digest. Activity Rest as told by your health care provider. Avoid sitting for a long time without moving. Get up to take short walks every 1-2 hours. This is important to improve blood flow and breathing. Ask for help if you feel weak or unsteady. Return to your normal activities as told by your health care provider. Ask your health care provider what activities are safe for you. Managing cramping and bloating  Try walking around when you have cramps or feel bloated. Apply heat to your abdomen as told by your health care provider. Use the heat source that your health care provider recommends, such as a moist heat pack or a heating pad. Place a towel between your skin and the heat source. Leave the heat on for 20-30 minutes. Remove the heat if your skin turns bright red. This is especially important if you are unable to feel pain, heat, or cold. You may have a greater risk of getting burned. General instructions If you were given a sedative during the procedure, it can affect you for several hours. Do not drive or operate machinery until your health care provider says that it is safe. For the first 24 hours after the procedure: Do not sign important  documents. Do not drink alcohol. Do your regular daily activities at a slower pace than normal. Eat soft foods that are easy to digest. Take over-the-counter and prescription medicines only as told by your health care provider. Keep all follow-up visits as told by your health care provider. This is important. Contact a health care provider if: You have blood in your stool 2-3 days after the procedure. Get help right away if you have: More than a small spotting of blood in your stool. Large blood clots in your stool. Swelling of your abdomen. Nausea or  vomiting. A fever. Increasing pain in your abdomen that is not relieved with medicine. Summary After the procedure, it is common to have a small amount of blood in your stool. You may also have mild cramping and bloating of your abdomen. If you were given a sedative during the procedure, it can affect you for several hours. Do not drive or operate machinery until your health care provider says that it is safe. Get help right away if you have a lot of blood in your stool, nausea or vomiting, a fever, or increased pain in your abdomen. This information is not intended to replace advice given to you by your health care provider. Make sure you discuss any questions you have with your health care provider. Document Revised: 08/19/2019 Document Reviewed: 05/09/2019 Elsevier Patient Education  [place] After This sheet gives you information about how to care for yourself after your procedure. Your health care provider may also give you more specific instructions. If you have problems or questions, contact your health care provider. What can I expect after the procedure? After the procedure, it is common to have: Tiredness. Forgetfulness about what happened after the procedure. Impaired judgment for important decisions. Nausea or vomiting. Some difficulty with balance. Follow these instructions at home: For the time period you were told by your health care provider:   Rest as needed. Do not participate in activities where you could fall or become injured. Do not drive or use machinery. Do not drink alcohol. Do not take sleeping pills or medicines that cause drowsiness. Do not make important decisions or sign legal documents. Do not take care of children on your own. Eating and drinking Follow the diet that is recommended by your health care provider. Drink enough fluid to keep your urine pale yellow. If you vomit: Drink water, juice, or soup when  you can drink without vomiting. Make sure you have little or no nausea before eating solid foods. General instructions Have a responsible adult stay with you for the time you are told. It is important to have someone help care for you until you are awake and alert. Take over-the-counter and prescription medicines only as told by your health care provider. If you have sleep apnea, surgery and certain medicines can increase your risk for breathing problems. Follow instructions from your health care provider about wearing your sleep device: Anytime you are sleeping, including during daytime naps. While taking prescription pain medicines, sleeping medicines, or medicines that make you drowsy. Avoid smoking. Keep all follow-up visits as told by your health care provider. This is important. Contact a health care provider if: You keep feeling nauseous or you keep vomiting. You feel light-headed. You are still sleepy or having trouble with balance after 24 hours. You develop a rash. You have a fever. You have redness or swelling around the IV site. Get help right away if: You have trouble  breathing. You have new-onset confusion at home. Summary For several hours after your procedure, you may feel tired. You may also be forgetful and have poor judgment. Have a responsible adult stay with you for the time you are told. It is important to have someone help care for you until you are awake and alert. Rest as told. Do not drive or operate machinery. Do not drink alcohol or take sleeping pills. Get help right away if you have trouble breathing, or if you suddenly become confused. This information is not intended to replace advice given to you by your health care provider. Make sure you discuss any questions you have with your health care provider. Document Revised: 06/28/2020 Document Reviewed: 09/15/2019 Elsevier Patient Education  2022 [employer].

## 2021-11-01 ENCOUNTER — Other Ambulatory Visit: Payer: Self-pay | Admitting: Family Medicine

## 2021-11-01 ENCOUNTER — Encounter (HOSPITAL_COMMUNITY)
Admission: RE | Admit: 2021-11-01 | Discharge: 2021-11-01 | Disposition: A | Discharge status: Home / Self Care | Payer: Medicare Other | Source: Ambulatory Visit | Attending: Internal Medicine | Admitting: Internal Medicine

## 2021-11-01 ENCOUNTER — Encounter (HOSPITAL_COMMUNITY): Payer: Self-pay

## 2021-11-01 DIAGNOSIS — Z79899 Other long term (current) drug therapy: Secondary | ICD-10-CM | POA: Insufficient documentation

## 2021-11-01 DIAGNOSIS — Z01818 Encounter for other preprocedural examination: Secondary | ICD-10-CM | POA: Insufficient documentation

## 2021-11-01 LAB — BASIC METABOLIC PANEL
Anion gap: 10 (ref 5–15)
BUN: 13 mg/dL (ref 8–23)
CO2: 27 mmol/L (ref 22–32)
Calcium: 9.3 mg/dL (ref 8.9–10.3)
Chloride: 101 mmol/L (ref 98–111)
Creatinine, Ser: 0.92 mg/dL (ref 0.61–1.24)
GFR, Estimated: >60 mL/min (ref >60)
Glucose, Bld: 93 mg/dL (ref 70–99)
Potassium: 4.2 mmol/L (ref 3.5–5.1)
Sodium: 138 mmol/L (ref 135–145)

## 2021-11-04 ENCOUNTER — Encounter (HOSPITAL_COMMUNITY)
Admission: RE | Disposition: A | Discharge status: Home / Self Care | Payer: Self-pay | Source: Home / Self Care | Attending: Internal Medicine

## 2021-11-04 ENCOUNTER — Ambulatory Visit (HOSPITAL_COMMUNITY): Payer: Medicare Other | Admitting: Anesthesiology

## 2021-11-04 ENCOUNTER — Ambulatory Visit (HOSPITAL_COMMUNITY)
Admission: RE | Admit: 2021-11-04 | Discharge: 2021-11-04 | Disposition: A | Discharge status: Home / Self Care | Payer: Medicare Other | Attending: Internal Medicine | Admitting: Internal Medicine

## 2021-11-04 ENCOUNTER — Encounter (HOSPITAL_COMMUNITY): Payer: Self-pay

## 2021-11-04 DIAGNOSIS — K648 Other hemorrhoids: Secondary | ICD-10-CM | POA: Insufficient documentation

## 2021-11-04 DIAGNOSIS — D124 Benign neoplasm of descending colon: Secondary | ICD-10-CM | POA: Diagnosis not present

## 2021-11-04 DIAGNOSIS — Z1211 Encounter for screening for malignant neoplasm of colon: Secondary | ICD-10-CM

## 2021-11-04 DIAGNOSIS — K635 Polyp of colon: Secondary | ICD-10-CM

## 2021-11-04 DIAGNOSIS — F17210 Nicotine dependence, cigarettes, uncomplicated: Secondary | ICD-10-CM | POA: Diagnosis not present

## 2021-11-04 DIAGNOSIS — K621 Rectal polyp: Secondary | ICD-10-CM

## 2021-11-04 HISTORY — PX: COLONOSCOPY WITH PROPOFOL: SHX5780

## 2021-11-04 HISTORY — PX: POLYPECTOMY: SHX5525

## 2021-11-04 SURGERY — COLONOSCOPY WITH PROPOFOL
Anesthesia: General

## 2021-11-04 MED ORDER — PROPOFOL 10 MG/ML IV BOLUS
INTRAVENOUS | Status: DC | PRN
  Administered 2021-11-04: 90 mg via INTRAVENOUS
  Administered 2021-11-04 (×6): 30 mg via INTRAVENOUS
  Administered 2021-11-04: 20 mg via INTRAVENOUS

## 2021-11-04 MED ORDER — STERILE WATER FOR IRRIGATION IR SOLN
Status: DC | PRN
  Administered 2021-11-04: 60 mL

## 2021-11-04 MED ORDER — EPHEDRINE SULFATE 50 MG/ML IJ SOLN
INTRAMUSCULAR | Status: DC | PRN
  Administered 2021-11-04: 5 mg via INTRAVENOUS

## 2021-11-04 MED ORDER — LACTATED RINGERS IV SOLN: INTRAVENOUS | Status: DC

## 2021-11-04 NOTE — Transfer of Care (Signed)
Immediate Anesthesia Transfer of Care Note  Patient: Dillon [name]  Procedure(s) Performed: COLONOSCOPY WITH PROPOFOL POLYPECTOMY  Patient Location: Short Stay  Anesthesia Type:General  Level of Consciousness: drowsy  Airway & Oxygen Therapy: Patient Spontanous Breathing  Post-op Assessment: Report given to RN and Post -op Vital signs reviewed and stable  Post vital signs: Reviewed and stable  Last Vitals:  Vitals Value Taken Time  BP 99/57 11/04/21 1058  Temp 36.6 C 11/04/21 1058  Pulse 77 11/04/21 1058  Resp 20 11/04/21 1058  SpO2 99 % 11/04/21 1058    Last Pain:  Vitals:   11/04/21 1058  TempSrc: Axillary  PainSc: 0-No pain      Patients Stated Pain Goal: 5 (38/46/65 [id number])  Complications: No notable events documented.

## 2021-11-04 NOTE — H&P (Signed)
Primary Care Physician:  Fayrene Helper, MD Primary Gastroenterologist:  Dr. Abbey Chatters  Pre-Procedure History & Physical: HPI:  LONDELL NOLL is a 71 y.o. male is here for a colonoscopy for colon cancer screening purposes.  Patient denies any family history of colorectal cancer.  No melena or hematochezia.  No abdominal pain or unintentional weight loss.  No change in bowel habits.  Overall feels well from a GI standpoint.  Past Medical History:  Diagnosis Date   Arthritis    2012   Asthma    childhood   COPD (chronic obstructive pulmonary disease) ([place])    Glaucoma    Hyperlipidemia    1990   Hypertension    1990   Nicotine addiction    PAD (peripheral artery disease) ([place]) 2015    Past Surgical History:  Procedure Laterality Date   KNEE ARTHROSCOPY     both knees    LOWER EXTREMITY ANGIOGRAPHY N/A 02/04/2018   Procedure: LOWER EXTREMITY ANGIOGRAPHY;  Surgeon: Conrad , MD;  Location: [place] [facility];  Service: Cardiovascular;  Laterality: N/A;    Prior to Admission medications   Medication Sig Start Date End Date Taking? Authorizing Provider  ALPHAGAN P 0.1 % SOLN Place 1 drop into both eyes in the morning and at bedtime. 01/28/21  Yes [provider]  amLODipine (NORVASC) 10 MG tablet TAKE 1 TABLET BY MOUTH EVERY DAY 08/22/21  Yes Fayrene Helper, MD  aspirin EC 81 MG tablet Take 81 mg by mouth daily.   Yes [provider]  atorvastatin (LIPITOR) 80 MG tablet TAKE 1 TABLET BY MOUTH EVERY DAY 02/25/21  Yes Branch, Alphonse Guild, MD  BREO ELLIPTA 200-25 MCG/ACT AEPB INHALE 1 PUFF BY MOUTH EVERY DAY 11/01/21  Yes Fayrene Helper, MD  cyclobenzaprine (FLEXERIL) 10 MG tablet TAKE 1 TABLET (10 MG TOTAL) BY MOUTH AT BEDTIME AS NEEDED FOR SPASM. 10/15/21  Yes Sanjuana Kava, MD  famotidine (PEPCID) 40 MG tablet Take 1 tablet (40 mg total) by mouth daily. 10/17/21  Yes Fayrene Helper, MD  gabapentin (NEURONTIN) 300 MG capsule TAKE 1 CAPSULE (300 MG  TOTAL) BY MOUTH AT BEDTIME. 12/21/15  Yes Fayrene Helper, MD  HYDROcodone-acetaminophen (NORCO) 7.5-325 MG tablet One tablet every six hours as need for pain.  Must last 28 days. 10/07/21  Yes Sanjuana Kava, MD  latanoprost (XALATAN) 0.005 % ophthalmic solution 1 drop daily. 01/05/21  Yes [provider]  levalbuterol (XOPENEX) 0.63 MG/3ML nebulizer solution Take 3 mLs (0.63 mg total) by nebulization every 8 (eight) hours as needed for wheezing or shortness of breath. 12/13/20  Yes Fayrene Helper, MD  polyethylene glycol-electrolytes (TRILYTE) 420 g solution Take 4,000 mLs by mouth as directed. 09/16/21  Yes Eloise Harman, DO  predniSONE (DELTASONE) 20 MG tablet Take pone tablet two times daily for 3 days, then take one tablet once daily for 4 days then stop 10/17/21  Yes Fayrene Helper, MD  spironolactone (ALDACTONE) 50 MG tablet TAKE 1 TABLET BY MOUTH EVERY DAY 08/22/21  Yes Fayrene Helper, MD  Vitamin D, Ergocalciferol, (DRISDOL) 1.25 MG (50000 UNIT) CAPS capsule TAKE 1 CAPSULE (50,000 UNITS TOTAL) BY MOUTH ONCE A WEEK. ONE CAPSULE ONCE WEEKLY 12/27/20  Yes Fayrene Helper, MD  ipratropium (ATROVENT) 0.02 % nebulizer solution Take 2.5 mLs (0.5 mg total) by nebulization every 6 (six) hours as needed for wheezing or shortness of breath. 06/09/18   Fayrene Helper, MD  RHOPRESSA 0.02 %  SOLN Place 1 drop into the right eye at bedtime. 05/17/21   [provider]  UNABLE TO FIND Nebulizer facemask and tubing x 1  DX J44.9 10/28/18   Fayrene Helper, MD    Allergies as of 09/16/2021 - Review Complete 09/16/2021  Allergen Reaction Noted   Penicillins Hives and Other (See Comments) 02/24/2008    Family History  Problem Relation Age of Onset   Heart attack Mother    Diabetes Mother    Hypertension Mother    Stroke Brother    Hypertension Sister    Hypertension Sister    Hypertension Sister    Hypertension Brother    Colon cancer Neg Hx     Social  History   Socioeconomic History   Marital status: Single    Spouse name: Not on file   Number of children: Not on file   Years of education: Not on file   Highest education level: Not on file  Occupational History   Not on file  Tobacco Use   Smoking status: Every Day    Years: 48.00    Types: Cigarettes    Start date: 10/16/1969   Smokeless tobacco: Never  Vaping Use   Vaping Use: Never used  Substance and Sexual Activity   Alcohol use: Yes    Comment: occassional, max of 72 oz pwr month   Drug use: No   Sexual activity: Yes    Partners: Female  Other Topics Concern   Not on file  Social History Narrative   Not on file   Social Determinants of Health   Financial Resource Strain: Low Risk    Difficulty of Paying Living Expenses: Not hard at all  Food Insecurity: No Food Insecurity   Worried About Charity fundraiser in the Last Year: Never true   Agenda in the Last Year: Never true  Transportation Needs: No Transportation Needs   Lack of Transportation (Medical): No   Lack of Transportation (Non-Medical): No  Physical Activity: Sufficiently Active   Days of Exercise per Week: 4 days   Minutes of Exercise per Session: 60 min  Stress: No Stress Concern Present   Feeling of Stress : Not at all  Social Connections: Moderately Isolated   Frequency of Communication with Friends and Family: More than three times a week   Frequency of Social Gatherings with Friends and Family: More than three times a week   Attends Religious Services: 1 to 4 times per year   Active Member of [employer] or Organizations: No   Attends Archivist Meetings: Never   Marital Status: Never married  Human resources officer Violence: Not At Risk   Fear of Current or Ex-Partner: No   Emotionally Abused: No   Physically Abused: No   Sexually Abused: No    Review of Systems: See HPI, otherwise negative ROS  Physical Exam: Vital signs in last 24 hours: Temp:  [97.9 F (36.6 C)] 97.9  F (36.6 C) (01/09 1003) Pulse Rate:  [71] 71 (01/09 1003) Resp:  [14] 14 (01/09 1003) BP: (144)/(99) 144/99 (01/09 1003) SpO2:  [100 %] 100 % (01/09 1003)   General:   Alert,  Well-developed, well-nourished, pleasant and cooperative in NAD Head:  Normocephalic and atraumatic. Eyes:  Sclera clear, no icterus.   Conjunctiva pink. Ears:  Normal auditory acuity. Nose:  No deformity, discharge,  or lesions. Mouth:  No deformity or lesions, dentition normal. Neck:  Supple; no masses or thyromegaly. Lungs:  Clear throughout to auscultation.   No wheezes, crackles, or rhonchi. No acute distress. Heart:  Regular rate and rhythm; no murmurs, clicks, rubs,  or gallops. Abdomen:  Soft, nontender and nondistended. No masses, hepatosplenomegaly or hernias noted. Normal bowel sounds, without guarding, and without rebound.   Msk:  Symmetrical without gross deformities. Normal posture. Extremities:  Without clubbing or edema. Neurologic:  Alert and  oriented x4;  grossly normal neurologically. Skin:  Intact without significant lesions or rashes. Cervical Nodes:  No significant cervical adenopathy. Psych:  Alert and cooperative. Normal mood and affect.  Impression/Plan: [name] is here for a colonoscopy to be performed for colon cancer screening purposes.  The risks of the procedure including infection, bleed, or perforation as well as benefits, limitations, alternatives and imponderables have been reviewed with the patient. Questions have been answered. All parties agreeable.

## 2021-11-04 NOTE — Anesthesia Preprocedure Evaluation (Signed)
Anesthesia Evaluation  Patient identified by MRN, date of birth, ID band Patient awake    Reviewed: Allergy & Precautions, H&P , NPO status , Patient's Chart, lab work & pertinent test results, reviewed documented beta blocker date and time   Airway Mallampati: II  TM Distance: >3 FB Neck ROM: full    Dental no notable dental hx.    Pulmonary asthma , COPD, Current Smoker,    Pulmonary exam normal breath sounds clear to auscultation       Cardiovascular Exercise Tolerance: Good hypertension, + CAD and + Peripheral Vascular Disease   Rhythm:regular Rate:Normal     Neuro/Psych  Neuromuscular disease negative psych ROS   GI/Hepatic negative GI ROS, Neg liver ROS,   Endo/Other  negative endocrine ROS  Renal/GU negative Renal ROS  negative genitourinary   Musculoskeletal   Abdominal   Peds  Hematology negative hematology ROS (+)   Anesthesia Other Findings   Reproductive/Obstetrics negative OB ROS                             Anesthesia Physical Anesthesia Plan  ASA: 3  Anesthesia Plan: General   Post-op Pain Management:    Induction:   PONV Risk Score and Plan: Propofol infusion  Airway Management Planned:   Additional Equipment:   Intra-op Plan:   Post-operative Plan:   Informed Consent: I have reviewed the patients History and Physical, chart, labs and discussed the procedure including the risks, benefits and alternatives for the proposed anesthesia with the patient or authorized representative who has indicated his/her understanding and acceptance.     Dental Advisory Given  Plan Discussed with: CRNA  Anesthesia Plan Comments:         Anesthesia Quick Evaluation

## 2021-11-04 NOTE — Anesthesia Postprocedure Evaluation (Signed)
Anesthesia Post Note  Patient: Dillon [name]  Procedure(s) Performed: COLONOSCOPY WITH PROPOFOL POLYPECTOMY  Patient location during evaluation: Phase II Anesthesia Type: General Level of consciousness: awake Pain management: pain level controlled Vital Signs Assessment: post-procedure vital signs reviewed and stable Respiratory status: spontaneous breathing and respiratory function stable Cardiovascular status: blood pressure returned to baseline and stable Postop Assessment: no headache and no apparent nausea or vomiting Anesthetic complications: no Comments: Late entry   No notable events documented.   Last Vitals:  Vitals:   11/04/21 1003 11/04/21 1058  BP: (!) 144/99 (!) 99/57  Pulse: 71 77  Resp: 14 20  Temp: 36.6 C 36.6 C  SpO2: 100% 99%    Last Pain:  Vitals:   11/04/21 1058  TempSrc: Axillary  PainSc: 0-No pain                 Louann Sjogren

## 2021-11-04 NOTE — Discharge Instructions (Addendum)
°  Colonoscopy Discharge Instructions  Read the instructions outlined below and refer to this sheet in the next few weeks. These discharge instructions provide you with general information on caring for yourself after you leave the hospital. Your doctor may also give you specific instructions. While your treatment has been planned according to the most current medical practices available, unavoidable complications occasionally occur.   ACTIVITY You may resume your regular activity, but move at a slower pace for the next 24 hours.  Take frequent rest periods for the next 24 hours.  Walking will help get rid of the air and reduce the bloated feeling in your belly (abdomen).  No driving for 24 hours (because of the medicine (anesthesia) used during the test).   Do not sign any important legal documents or operate any machinery for 24 hours (because of the anesthesia used during the test).  NUTRITION Drink plenty of fluids.  You may resume your normal diet as instructed by your doctor.  Begin with a light meal and progress to your normal diet. Heavy or fried foods are harder to digest and may make you feel sick to your stomach (nauseated).  Avoid alcoholic beverages for 24 hours or as instructed.  MEDICATIONS You may resume your normal medications unless your doctor tells you otherwise.  WHAT YOU CAN EXPECT TODAY Some feelings of bloating in the abdomen.  Passage of more gas than usual.  Spotting of blood in your stool or on the toilet paper.  IF YOU HAD POLYPS REMOVED DURING THE COLONOSCOPY: No aspirin products for 7 days or as instructed.  No alcohol for 7 days or as instructed.  Eat a soft diet for the next 24 hours.  FINDING OUT THE RESULTS OF YOUR TEST Not all test results are available during your visit. If your test results are not back during the visit, make an appointment with your caregiver to find out the results. Do not assume everything is normal if you have not heard from your  caregiver or the medical facility. It is important for you to follow up on all of your test results.  SEEK IMMEDIATE MEDICAL ATTENTION IF: You have more than a spotting of blood in your stool.  Your belly is swollen (abdominal distention).  You are nauseated or vomiting.  You have a temperature over 101.  You have abdominal pain or discomfort that is severe or gets worse throughout the day.   Your colonoscopy revealed 5 polyp(s) which I removed successfully. Await pathology results, my office will contact you. I recommend repeating colonoscopy in 5 years for surveillance purposes. Otherwise follow up with GI as needed.    I hope you have a great rest of your week!  Charles K. Carver, D.O. Gastroenterology and Hepatology [facility]  

## 2021-11-04 NOTE — Op Note (Signed)
[facility] Patient Name: Dillon [name] Procedure Date: 11/04/2021 10:13 AM MRN: [id number] Date of Birth: 1951-09-22 Attending MD: Elon Alas. Abbey Chatters DO CSN: [id number] Age: 71 Admit Type: Outpatient Procedure:                Colonoscopy Indications:              Screening for colorectal malignant neoplasm Providers:                Elon Alas. Abbey Chatters, DO, Hughie Closs, RN, Tammy                            Vaught, RN, Caprice Kluver, Nelma Rothman, Technician Referring MD:              Medicines:                See the Anesthesia note for documentation of the                            administered medications Complications:            No immediate complications. Estimated Blood Loss:     Estimated blood loss was minimal. Procedure:                Pre-Anesthesia Assessment:                           - The anesthesia plan was to use monitored                            anesthesia care (MAC).                           After obtaining informed consent, the colonoscope                            was passed under direct vision. Throughout the                            procedure, the patient's blood pressure, pulse, and                            oxygen saturations were monitored continuously. The                            PCF-HQ190L ([id number]) scope was introduced through                            the anus and advanced to the the cecum, identified                            by appendiceal orifice and ileocecal valve. The                            colonoscopy was performed without difficulty. The                            patient  tolerated the procedure well. The quality                            of the bowel preparation was evaluated using the                            BBPS [facility] Bowel Preparation Scale) with scores                            of: Right Colon = 2 (minor amount of residual                            staining, small fragments of stool and/or opaque                             liquid, but mucosa seen well), Transverse Colon = 2                            (minor amount of residual staining, small fragments                            of stool and/or opaque liquid, but mucosa seen                            well) and Left Colon = 2 (minor amount of residual                            staining, small fragments of stool and/or opaque                            liquid, but mucosa seen well). The total BBPS score                            equals 6. The quality of the bowel preparation was                            fair. Scope In: 10:24:54 AM Scope Out: 10:54:02 AM Scope Withdrawal Time: 0 hours 21 minutes 1 second  Total Procedure Duration: 0 hours 29 minutes 8 seconds  Findings:      The perianal and digital rectal examinations were normal.      Non-bleeding internal hemorrhoids were found during endoscopy.      Three sessile polyps were found in the descending colon. The polyps were       6 to 8 mm in size. These polyps were removed with a cold snare.       Resection and retrieval were complete.      Two flat polyps were found in the rectum. The polyps were 3 to 4 mm in       size. These polyps were removed with a cold snare. Resection was       complete, but the polyp tissue was not retrieved.      The exam was otherwise without abnormality. Impression:               -  Preparation of the colon was fair.                           - Non-bleeding internal hemorrhoids.                           - Three 6 to 8 mm polyps in the descending colon,                            removed with a cold snare. Resected and retrieved.                           - Two 3 to 4 mm polyps in the rectum, removed with                            a cold snare. Complete resection. Polyp tissue not                            retrieved.                           - The examination was otherwise normal. Moderate Sedation:      Per Anesthesia Care Recommendation:           - Patient has a  contact number available for                            emergencies. The signs and symptoms of potential                            delayed complications were discussed with the                            patient. Return to normal activities tomorrow.                            Written discharge instructions were provided to the                            patient.                           - Resume previous diet.                           - Continue present medications.                           - Await pathology results.                           - Repeat colonoscopy in 5 years for surveillance.                           - Return to GI clinic PRN. Procedure Code(s):        --- Professional ---  45385, Colonoscopy, flexible; with removal of                            tumor(s), polyp(s), or other lesion(s) by snare                            technique Diagnosis Code(s):        --- Professional ---                           Z12.11, Encounter for screening for malignant                            neoplasm of colon                           K64.8, Other hemorrhoids                           K63.5, Polyp of colon                           K62.1, Rectal polyp CPT copyright 2019 American Medical Association. All rights reserved. The codes documented in this report are preliminary and upon coder review may  be revised to meet current compliance requirements. Elon Alas. Abbey Chatters, DO [place] Abbey Chatters, DO 11/04/2021 10:57:06 AM This report has been signed electronically. Number of Addenda: 0

## 2021-11-05 LAB — SURGICAL PATHOLOGY

## 2021-11-07 ENCOUNTER — Encounter (HOSPITAL_COMMUNITY): Payer: Self-pay | Admitting: Internal Medicine

## 2021-11-10 ENCOUNTER — Telehealth: Payer: Self-pay | Admitting: Orthopaedic Surgery

## 2021-11-12 MED ORDER — HYDROCODONE-ACETAMINOPHEN 7.5-325 MG PO TABS: ORAL_TABLET | ORAL | 0 refills | Status: DC

## 2021-12-09 ENCOUNTER — Telehealth: Payer: Self-pay | Admitting: Orthopaedic Surgery

## 2021-12-09 MED ORDER — HYDROCODONE-ACETAMINOPHEN 7.5-325 MG PO TABS: ORAL_TABLET | ORAL | 0 refills | Status: DC

## 2021-12-13 DIAGNOSIS — H25813 Combined forms of age-related cataract, bilateral: Secondary | ICD-10-CM | POA: Diagnosis not present

## 2021-12-13 DIAGNOSIS — H401134 Primary open-angle glaucoma, bilateral, indeterminate stage: Secondary | ICD-10-CM | POA: Diagnosis not present

## 2021-12-13 DIAGNOSIS — H52223 Regular astigmatism, bilateral: Secondary | ICD-10-CM | POA: Diagnosis not present

## 2021-12-20 ENCOUNTER — Ambulatory Visit: Payer: Medicare Other | Admitting: Family Medicine

## 2022-01-06 ENCOUNTER — Telehealth: Payer: Self-pay | Admitting: Orthopaedic Surgery

## 2022-01-06 MED ORDER — HYDROCODONE-ACETAMINOPHEN 7.5-325 MG PO TABS: ORAL_TABLET | ORAL | 0 refills | Status: DC

## 2022-01-14 ENCOUNTER — Ambulatory Visit: Payer: Medicare Other | Admitting: Orthopaedic Surgery

## 2022-01-14 ENCOUNTER — Encounter: Payer: Self-pay | Admitting: Orthopaedic Surgery

## 2022-01-23 ENCOUNTER — Ambulatory Visit: Payer: Medicare Other | Admitting: Orthopaedic Surgery

## 2022-01-23 DIAGNOSIS — H401134 Primary open-angle glaucoma, bilateral, indeterminate stage: Secondary | ICD-10-CM | POA: Diagnosis not present

## 2022-01-28 ENCOUNTER — Ambulatory Visit: Payer: Medicare Other | Admitting: Family Medicine

## 2022-01-28 ENCOUNTER — Ambulatory Visit (INDEPENDENT_AMBULATORY_CARE_PROVIDER_SITE_OTHER): Payer: Medicare Other | Admitting: Orthopaedic Surgery

## 2022-01-28 ENCOUNTER — Encounter: Payer: Self-pay | Admitting: Orthopaedic Surgery

## 2022-01-28 VITALS — BP 143/75 | HR 80 | Ht 74.5 in | Wt 160.0 lb

## 2022-01-28 DIAGNOSIS — M5442 Lumbago with sciatica, left side: Secondary | ICD-10-CM

## 2022-01-28 DIAGNOSIS — F17210 Nicotine dependence, cigarettes, uncomplicated: Secondary | ICD-10-CM

## 2022-01-28 DIAGNOSIS — G8929 Other chronic pain: Secondary | ICD-10-CM | POA: Diagnosis not present

## 2022-01-28 NOTE — Progress Notes (Signed)
My back has good and bad days. ? ?He has chronic lower back pain that comes and goes depending on his activity and the weather.  He has no weakness, no new trauma.  He is active.  He is taking his medicine.   ? ?BP (!) 143/75   Pulse 80   Ht 6' 2.5" (1.892 m)   Wt 160 lb (72.6 kg)   BMI 20.27 kg/m?  ? ?Spine/Pelvis examination: ? Inspection:  Overall, sacoiliac joint benign and hips nontender; without crepitus or defects. ? ? Thoracic spine inspection: Alignment normal without kyphosis present ? ? Lumbar spine inspection:  Alignment  with normal lumbar lordosis, without scoliosis apparent. ? ? Thoracic spine palpation:  without tenderness of spinal processes ? ? Lumbar spine palpation: without tenderness of lumbar area; without tightness of lumbar muscles  ? ? Range of Motion: ?  Lumbar flexion, forward flexion is normal without pain or tenderness  ?  Lumbar extension is full without pain or tenderness ?  Left lateral bend is normal without pain or tenderness ?  Right lateral bend is normal without pain or tenderness ?  Straight leg raising is normal ? Strength & tone: normal ? ? Stability overall normal stability ? ?Encounter Diagnoses  ?Name Primary?  ? Chronic midline low back pain with left-sided sciatica Yes  ? Cigarette nicotine dependence without complication   ? ?Continue activity levels. ? ?Return in three months. ? ?Call if any problem. ? ?Precautions discussed. ? ?Electronically Signed ?Sanjuana Kava, MD ?4/4/20238:00 AM ? ? ?

## 2022-01-29 ENCOUNTER — Ambulatory Visit (INDEPENDENT_AMBULATORY_CARE_PROVIDER_SITE_OTHER): Payer: Medicare Other | Admitting: Family Medicine

## 2022-01-29 ENCOUNTER — Encounter: Payer: Self-pay | Admitting: Family Medicine

## 2022-01-29 VITALS — BP 134/77 | HR 91 | Ht 74.0 in | Wt 156.0 lb

## 2022-01-29 DIAGNOSIS — F17210 Nicotine dependence, cigarettes, uncomplicated: Secondary | ICD-10-CM

## 2022-01-29 DIAGNOSIS — R972 Elevated prostate specific antigen [PSA]: Secondary | ICD-10-CM

## 2022-01-29 DIAGNOSIS — M545 Low back pain, unspecified: Secondary | ICD-10-CM

## 2022-01-29 DIAGNOSIS — E785 Hyperlipidemia, unspecified: Secondary | ICD-10-CM | POA: Diagnosis not present

## 2022-01-29 DIAGNOSIS — R918 Other nonspecific abnormal finding of lung field: Secondary | ICD-10-CM

## 2022-01-29 DIAGNOSIS — E559 Vitamin D deficiency, unspecified: Secondary | ICD-10-CM | POA: Diagnosis not present

## 2022-01-29 DIAGNOSIS — I10 Essential (primary) hypertension: Secondary | ICD-10-CM

## 2022-01-29 DIAGNOSIS — Z2882 Immunization not carried out because of caregiver refusal: Secondary | ICD-10-CM | POA: Diagnosis not present

## 2022-01-29 DIAGNOSIS — F17200 Nicotine dependence, unspecified, uncomplicated: Secondary | ICD-10-CM

## 2022-01-29 NOTE — Assessment & Plan Note (Signed)
Needs rept scan ?

## 2022-01-29 NOTE — Progress Notes (Signed)
? ?  Dillon [name]     MRN: [id number]      DOB: February 13, 1951 ? ? ?HPI ?Mr. Dillon [name] is here for follow up and re-evaluation of chronic medical conditions, medication management and review of any available recent lab and radiology data.  ?Preventive health is updated, specifically  Cancer screening and Immunization.   ?Questions or concerns regarding consultations or procedures which the PT has had in the interim are  addressed. ?The PT denies any adverse reactions to current medications since the last visit.  ?There are no new concerns.  ?There are no specific complaints  ? ?ROS ?Denies recent fever or chills. ?Denies sinus pressure, nasal congestion, ear pain or sore throat. ?Denies chest congestion, productive cough or wheezing. ?Denies chest pains, palpitations and leg swelling ?Denies abdominal pain, nausea, vomiting,diarrhea or constipation.   ?Denies dysuria, frequency, hesitancy or incontinence. ?Denies uncontrolled joint pain, swelling and limitation in mobility. ?Denies headaches, seizures, numbness, or tingling. ?Denies depression, anxiety or insomnia. ?Denies skin break down or rash. ? ? ?PE ? ?BP 134/77   Pulse 91   Ht '6\' 2"'$  (1.88 m)   Wt 156 lb 0.6 oz (70.8 kg)   SpO2 94%   BMI 20.03 kg/m?  ? ?Patient alert and oriented and in no cardiopulmonary distress. ? ?HEENT: No facial asymmetry, EOMI,     Neck supple . ?Decreased air entry ? ?CVS: S1, S2 no murmurs, no S3.Regular rate. ? ?ABD: Soft non tender.  ? ?Ext: No edema ? ?MS: Adequate ROM spine, shoulders, hips and knees. ? ?Skin: Intact, no ulcerations or rash noted. ? ?Psych: Good eye contact, normal affect. Memory intact not anxious or depressed appearing. ? ?CNS: CN 2-12 intact, power,  normal throughout.no focal deficits noted. ? ? ?Assessment & Plan ? ?NICOTINE ADDICTION ?Asked:confirms currently smokes cigarettes ?Assess: Unwilling to set a quit date, but is cutting back ?Advise: needs to QUIT to reduce risk of cancer, cardio and cerebrovascular  disease ?Assist: counseled for 5 minutes and literature provided ?Arrange: follow up in 2 to 4 months ? ? ?Vitamin D deficiency ?Updated lab needed at/ before next visit. ? ? ?Vaccine refused by parent ?Refuses all vaccines except covid  Booster, re educated re importance of vaccines ? ?Essential hypertension ?DASH diet and commitment to daily physical activity for a minimum of 30 minutes discussed and encouraged, as a part of hypertension management. ?The importance of attaining a healthy weight is also discussed. ? ? ?  01/29/2022  ?  8:06 AM 01/28/2022  ?  7:55 AM 11/04/2021  ? 10:58 AM 11/04/2021  ? 10:03 AM 11/01/2021  ?  1:02 PM 10/17/2021  ?  9:20 AM 10/17/2021  ?  9:06 AM  ?BP/Weight  ?Systolic BP 678 938 99 101  138 151  ?Diastolic BP 77 75 57 99  82 84  ?Wt. (Lbs) 156.04 160   158.12  158.12  ?BMI 20.03 kg/m2 20.27 kg/m2   20.27 kg/m2  20.03 kg/m2  ? ? ? ?Controlled, no change in medication ? ? ?Elevated PSA ?rept lab and refer back to urology if abn ? ?Back pain ?Managed by ortho, unchanged ? ?Lung nodules ?Needs rept scan ? ?

## 2022-01-29 NOTE — Assessment & Plan Note (Signed)
Asked:confirms currently smokes cigarettes °Assess: Unwilling to set a quit date, but is cutting back °Advise: needs to QUIT to reduce risk of cancer, cardio and cerebrovascular disease °Assist: counseled for 5 minutes and literature provided °Arrange: follow up in 2 to 4 months ° °

## 2022-01-29 NOTE — Assessment & Plan Note (Signed)
Updated lab needed at/ before next visit.   

## 2022-01-29 NOTE — Assessment & Plan Note (Signed)
Refuses all vaccines except covid  Booster, re educated re importance of vaccines ?

## 2022-01-29 NOTE — Assessment & Plan Note (Signed)
rept lab and refer back to urology if abn ?

## 2022-01-29 NOTE — Assessment & Plan Note (Signed)
DASH diet and commitment to daily physical activity for a minimum of 30 minutes discussed and encouraged, as a part of hypertension management. ?The importance of attaining a healthy weight is also discussed. ? ? ?  01/29/2022  ?  8:06 AM 01/28/2022  ?  7:55 AM 11/04/2021  ? 10:58 AM 11/04/2021  ? 10:03 AM 11/01/2021  ?  1:02 PM 10/17/2021  ?  9:20 AM 10/17/2021  ?  9:06 AM  ?BP/Weight  ?Systolic BP 840 375 99 436  138 151  ?Diastolic BP 77 75 57 99  82 84  ?Wt. (Lbs) 156.04 160   158.12  158.12  ?BMI 20.03 kg/m2 20.27 kg/m2   20.27 kg/m2  20.03 kg/m2  ? ? ? ?Controlled, no change in medication ? ?

## 2022-01-29 NOTE — Patient Instructions (Signed)
F/U in 6 months, call if you need me sooner ? ?Lipid, chem 7 and EGFr and vit D today ? ?You are referred for screening chest scan , important you go ? ?You are referred to Urology re elevated PSA , important you go ? ?Set quit date and STOP smoking please ? ?The polyps in your colon were precancerous, so please stop smoking ? ?Keep riding and gardening ? ?Enjoy the salads ? ?Need covid booster asap, and also9 all the other vaccines  ? ?Thanks for choosing [facility], we consider it a privelige to serve you. ? ?

## 2022-01-29 NOTE — Assessment & Plan Note (Signed)
Managed by ortho, unchanged ?

## 2022-01-30 LAB — BMP8+EGFR
BUN/Creatinine Ratio: 10 (ref 10–24)
BUN: 10 mg/dL (ref 8–27)
CO2: 24 mmol/L (ref 20–29)
Calcium: 9.6 mg/dL (ref 8.6–10.2)
Chloride: 103 mmol/L (ref 96–106)
Creatinine, Ser: 0.96 mg/dL (ref 0.76–1.27)
Glucose: 90 mg/dL (ref 70–99)
Potassium: 4.6 mmol/L (ref 3.5–5.2)
Sodium: 143 mmol/L (ref 134–144)
eGFR: 86 mL/min/{1.73_m2} (ref >59)

## 2022-01-30 LAB — LIPID PANEL
Chol/HDL Ratio: 2.9 ratio (ref 0.0–5.0)
Cholesterol, Total: 197 mg/dL (ref 100–199)
HDL: 68 mg/dL (ref >39)
LDL Chol Calc (NIH): 119 mg/dL — ABNORMAL HIGH (ref 0–99)
Triglycerides: 53 mg/dL (ref 0–149)
VLDL Cholesterol Cal: 10 mg/dL (ref 5–40)

## 2022-01-30 LAB — VITAMIN D 25 HYDROXY (VIT D DEFICIENCY, FRACTURES): Vit D, 25-Hydroxy: 21.1 ng/mL — ABNORMAL LOW (ref 30.0–100.0)

## 2022-02-04 ENCOUNTER — Telehealth: Payer: Self-pay | Admitting: Orthopaedic Surgery

## 2022-02-04 MED ORDER — HYDROCODONE-ACETAMINOPHEN 7.5-325 MG PO TABS: ORAL_TABLET | ORAL | 0 refills | Status: DC

## 2022-02-19 ENCOUNTER — Other Ambulatory Visit: Payer: Self-pay

## 2022-02-19 DIAGNOSIS — I714 Abdominal aortic aneurysm, without rupture, unspecified: Secondary | ICD-10-CM

## 2022-02-19 DIAGNOSIS — I70211 Atherosclerosis of native arteries of extremities with intermittent claudication, right leg: Secondary | ICD-10-CM

## 2022-02-20 ENCOUNTER — Other Ambulatory Visit: Payer: Self-pay | Admitting: Family Medicine

## 2022-02-20 DIAGNOSIS — I10 Essential (primary) hypertension: Secondary | ICD-10-CM

## 2022-02-21 DIAGNOSIS — H401121 Primary open-angle glaucoma, left eye, mild stage: Secondary | ICD-10-CM | POA: Diagnosis not present

## 2022-02-21 DIAGNOSIS — H401113 Primary open-angle glaucoma, right eye, severe stage: Secondary | ICD-10-CM | POA: Diagnosis not present

## 2022-03-06 ENCOUNTER — Ambulatory Visit (INDEPENDENT_AMBULATORY_CARE_PROVIDER_SITE_OTHER)
Admission: RE | Admit: 2022-03-06 | Discharge: 2022-03-06 | Disposition: A | Discharge status: Home / Self Care | Payer: Medicare Other | Source: Ambulatory Visit | Attending: Vascular Surgery | Admitting: Vascular Surgery

## 2022-03-06 ENCOUNTER — Ambulatory Visit (INDEPENDENT_AMBULATORY_CARE_PROVIDER_SITE_OTHER): Payer: Medicare Other | Admitting: Physician Assistant

## 2022-03-06 ENCOUNTER — Ambulatory Visit (HOSPITAL_COMMUNITY)
Admission: RE | Admit: 2022-03-06 | Discharge: 2022-03-06 | Disposition: A | Discharge status: Home / Self Care | Payer: Medicare Other | Source: Ambulatory Visit | Attending: Vascular Surgery | Admitting: Vascular Surgery

## 2022-03-06 VITALS — BP 145/84 | HR 81 | Temp 98.6°F | Resp 20 | Ht 74.0 in | Wt 155.2 lb

## 2022-03-06 DIAGNOSIS — I714 Abdominal aortic aneurysm, without rupture, unspecified: Secondary | ICD-10-CM

## 2022-03-06 DIAGNOSIS — I70211 Atherosclerosis of native arteries of extremities with intermittent claudication, right leg: Secondary | ICD-10-CM | POA: Diagnosis not present

## 2022-03-06 DIAGNOSIS — I739 Peripheral vascular disease, unspecified: Secondary | ICD-10-CM

## 2022-03-06 NOTE — Progress Notes (Signed)
?Office Note  ? ? ? ?CC:  follow up ?Requesting Provider:  Fayrene Helper, MD ? ?HPI: Dillon [name] is a 71 y.o. (02-Sep-1951) male who presents for surveillance of AAA as well as PAD.  He denies new or changing abdominal or back pain.  Last office visit 1 year ago AAA measured 3.5 cm on duplex. ? ?He is also followed for PAD.  He does have some right calf claudication symptoms however this only occurs after walking for about 15 minutes on flat ground.  He is still active and occasionally plays tennis with his nephews.  He denies any rest pain or nonhealing wounds of bilateral lower extremities.  He smokes about 6 or 7 cigarettes a day.  He is taking an aspirin and statin daily. ? ? ? ?Past Medical History:  ?Diagnosis Date  ? Arthritis   ? 2012  ? Asthma   ? childhood  ? COPD (chronic obstructive pulmonary disease) ([place])   ? Glaucoma   ? Hyperlipidemia   ? 1990  ? Hypertension   ? 1990  ? Nicotine addiction   ? PAD (peripheral artery disease) ([place]) 2015  ? ? ?Past Surgical History:  ?Procedure Laterality Date  ? COLONOSCOPY WITH PROPOFOL N/A 11/04/2021  ? Procedure: COLONOSCOPY WITH PROPOFOL;  Surgeon: Eloise Harman, DO;  [facility];  Service: Endoscopy;  Laterality: N/A;  9:30am  ? KNEE ARTHROSCOPY    ? both knees   ? LOWER EXTREMITY ANGIOGRAPHY N/A 02/04/2018  ? Procedure: LOWER EXTREMITY ANGIOGRAPHY;  Surgeon: Conrad , MD;  Location: [facility];  Service: Cardiovascular;  Laterality: N/A;  ? POLYPECTOMY  11/04/2021  ? Procedure: POLYPECTOMY;  Surgeon: Eloise Harman, DO;  Location: [facility];  Service: Endoscopy;;  ? ? ?Social History  ? ?Socioeconomic History  ? Marital status: Single  ?  Spouse name: Not on file  ? Number of children: Not on file  ? Years of education: Not on file  ? Highest education level: Not on file  ?Occupational History  ? Not on file  ?Tobacco Use  ? Smoking status: Every Day  ?  Years: 48.00  ?  Types: Cigarettes  ?  Start date: 10/16/1969  ?   Passive exposure: Never  ? Smokeless tobacco: Never  ?Vaping Use  ? Vaping Use: Never used  ?Substance and Sexual Activity  ? Alcohol use: Yes  ?  Comment: occassional, max of 72 oz pwr month  ? Drug use: No  ? Sexual activity: Yes  ?  Partners: Female  ?Other Topics Concern  ? Not on file  ?Social History Narrative  ? Not on file  ? ?Social Determinants of Health  ? ?Financial Resource Strain: Low Risk   ? Difficulty of Paying Living Expenses: Not hard at all  ?Food Insecurity: No Food Insecurity  ? Worried About Charity fundraiser in the Last Year: Never true  ? Ran Out of Food in the Last Year: Never true  ?Transportation Needs: No Transportation Needs  ? Lack of Transportation (Medical): No  ? Lack of Transportation (Non-Medical): No  ?Physical Activity: Sufficiently Active  ? Days of Exercise per Week: 4 days  ? Minutes of Exercise per Session: 60 min  ?Stress: No Stress Concern Present  ? Feeling of Stress : Not at all  ?Social Connections: Moderately Isolated  ? Frequency of Communication with Friends and Family: More than three times a week  ? Frequency of Social Gatherings with  Friends and Family: More than three times a week  ? Attends Religious Services: 1 to 4 times per year  ? Active Member of Clubs or Organizations: No  ? Attends Archivist Meetings: Never  ? Marital Status: Never married  ?Intimate Partner Violence: Not At Risk  ? Fear of Current or Ex-Partner: No  ? Emotionally Abused: No  ? Physically Abused: No  ? Sexually Abused: No  ? ? ?Family History  ?Problem Relation Age of Onset  ? Heart attack Mother   ? Diabetes Mother   ? Hypertension Mother   ? Stroke Brother   ? Hypertension Sister   ? Hypertension Sister   ? Hypertension Sister   ? Hypertension Brother   ? Colon cancer Neg Hx   ? ? ?Current Outpatient Medications  ?Medication Sig Dispense Refill  ? ALPHAGAN P 0.1 % SOLN Place 1 drop into both eyes in the morning and at bedtime.    ? amLODipine (NORVASC) 10 MG tablet TAKE 1  TABLET BY MOUTH EVERY DAY 90 tablet 1  ? aspirin EC 81 MG tablet Take 81 mg by mouth daily.    ? atorvastatin (LIPITOR) 80 MG tablet TAKE 1 TABLET BY MOUTH EVERY DAY 30 tablet 0  ? BREO ELLIPTA 200-25 MCG/ACT AEPB INHALE 1 PUFF BY MOUTH EVERY DAY 60 each 5  ? cyclobenzaprine (FLEXERIL) 10 MG tablet TAKE 1 TABLET (10 MG TOTAL) BY MOUTH AT BEDTIME AS NEEDED FOR SPASM. 30 tablet 3  ? dorzolamide-timolol (COSOPT) 22.3-6.8 MG/ML ophthalmic solution 1 drop 2 (two) times daily.    ? gabapentin (NEURONTIN) 300 MG capsule TAKE 1 CAPSULE (300 MG TOTAL) BY MOUTH AT BEDTIME. 30 capsule 2  ? HYDROcodone-acetaminophen (NORCO) 7.5-325 MG tablet One tablet every six hours as need for pain.  Must last 28 days. 110 tablet 0  ? ipratropium (ATROVENT) 0.02 % nebulizer solution Take 2.5 mLs (0.5 mg total) by nebulization every 6 (six) hours as needed for wheezing or shortness of breath. 75 mL 12  ? latanoprost (XALATAN) 0.005 % ophthalmic solution 1 drop daily.    ? levalbuterol (XOPENEX) 0.63 MG/3ML nebulizer solution Take 3 mLs (0.63 mg total) by nebulization every 8 (eight) hours as needed for wheezing or shortness of breath. 3 mL 12  ? predniSONE (DELTASONE) 20 MG tablet Take pone tablet two times daily for 3 days, then take one tablet once daily for 4 days then stop 10 tablet 0  ? RHOPRESSA 0.02 % SOLN Place 1 drop into the right eye at bedtime.    ? spironolactone (ALDACTONE) 50 MG tablet TAKE 1 TABLET BY MOUTH EVERY DAY 90 tablet 1  ? UNABLE TO FIND Nebulizer facemask and tubing x 1  ?DX J44.9 1 each 0  ? ?No current facility-administered medications for this visit.  ? ? ?Allergies  ?Allergen Reactions  ? Penicillins Hives and Other (See Comments)  ?  Has patient had a PCN reaction causing immediate rash, facial/tongue/throat swelling, SOB or lightheadedness with hypotension: Unknown ?Has patient had a PCN reaction causing severe rash involving mucus membranes or skin necrosis: No ?Has patient had a PCN reaction that required  hospitalization: Yes ?Has patient had a PCN reaction occurring within the last 10 years: No ?If all of the above answers are "NO", then may proceed with Cephalosporin use. ?  ? ? ? ?REVIEW OF SYSTEMS:  ? ?'[X]'$  denotes positive finding, '[ ]'$  denotes negative finding ?Cardiac  Comments:  ?Chest pain or chest pressure:    ?  Shortness of breath upon exertion:    ?Short of breath when lying flat:    ?Irregular heart rhythm:    ?    ?Vascular    ?Pain in calf, thigh, or hip brought on by ambulation:    ?Pain in feet at night that wakes you up from your sleep:     ?Blood clot in your veins:    ?Leg swelling:     ?    ?Pulmonary    ?Oxygen at home:    ?Productive cough:     ?Wheezing:     ?    ?Neurologic    ?Sudden weakness in arms or legs:     ?Sudden numbness in arms or legs:     ?Sudden onset of difficulty speaking or slurred speech:    ?Temporary loss of vision in one eye:     ?Problems with dizziness:     ?    ?Gastrointestinal    ?Blood in stool:     ?Vomited blood:     ?    ?Genitourinary    ?Burning when urinating:     ?Blood in urine:    ?    ?Psychiatric    ?Major depression:     ?    ?Hematologic    ?Bleeding problems:    ?Problems with blood clotting too easily:    ?    ?Skin    ?Rashes or ulcers:    ?    ?Constitutional    ?Fever or chills:    ? ? ?PHYSICAL EXAMINATION: ? ?Vitals:  ? 03/06/22 0933  ?BP: (!) 145/84  ?Pulse: 81  ?Resp: 20  ?Temp: 98.6 ?F (37 ?C)  ?TempSrc: Temporal  ?SpO2: 99%  ?Weight: 155 lb 3.2 oz (70.4 kg)  ?Height: '6\' 2"'$  (1.88 m)  ? ? ?General:  WDWN in NAD; vital signs documented above ?Gait: Not observed ?HENT: WNL, normocephalic ?Pulmonary: normal non-labored breathing , without Rales, rhonchi,  wheezing ?Cardiac: regular HR ?Abdomen: soft, NT, no masses ?Skin: without rashes ?Vascular Exam/Pulses: ? Right Left  ?Radial 2+ (normal) 2+ (normal)  ?DP absent absent  ?PT absent absent  ? ?Extremities: without ischemic changes, without Gangrene , without cellulitis; without open wounds;   ?Musculoskeletal: no muscle wasting or atrophy  ?Neurologic: A&O X 3;  No focal weakness or paresthesias are detected ?Psychiatric:  The pt has Normal affect. ? ? ?Non-Invasive Vascular Imaging:   ?ABI/TBIT[employer]'s AB

## 2022-03-07 ENCOUNTER — Other Ambulatory Visit: Payer: Self-pay | Admitting: Orthopaedic Surgery

## 2022-03-07 MED ORDER — HYDROCODONE-ACETAMINOPHEN 7.5-325 MG PO TABS: ORAL_TABLET | ORAL | 0 refills | Status: DC

## 2022-04-04 ENCOUNTER — Telehealth: Payer: Self-pay | Admitting: Orthopedic Surgery

## 2022-04-08 MED ORDER — HYDROCODONE-ACETAMINOPHEN 7.5-325 MG PO TABS: ORAL_TABLET | ORAL | 0 refills | Status: DC

## 2022-05-01 ENCOUNTER — Other Ambulatory Visit: Payer: Self-pay | Admitting: Family Medicine

## 2022-05-06 ENCOUNTER — Encounter: Payer: Self-pay | Admitting: Orthopaedic Surgery

## 2022-05-06 ENCOUNTER — Ambulatory Visit (INDEPENDENT_AMBULATORY_CARE_PROVIDER_SITE_OTHER): Payer: Medicare Other | Admitting: Orthopaedic Surgery

## 2022-05-06 DIAGNOSIS — G8929 Other chronic pain: Secondary | ICD-10-CM

## 2022-05-06 DIAGNOSIS — M25512 Pain in left shoulder: Secondary | ICD-10-CM | POA: Diagnosis not present

## 2022-05-06 DIAGNOSIS — I70211 Atherosclerosis of native arteries of extremities with intermittent claudication, right leg: Secondary | ICD-10-CM

## 2022-05-06 DIAGNOSIS — M5442 Lumbago with sciatica, left side: Secondary | ICD-10-CM

## 2022-05-06 DIAGNOSIS — F17210 Nicotine dependence, cigarettes, uncomplicated: Secondary | ICD-10-CM | POA: Diagnosis not present

## 2022-05-06 MED ORDER — HYDROCODONE-ACETAMINOPHEN 7.5-325 MG PO TABS: ORAL_TABLET | ORAL | 0 refills | Status: DC

## 2022-05-06 MED ORDER — CYCLOBENZAPRINE HCL 10 MG PO TABS: ORAL_TABLET | ORAL | 3 refills | Status: DC

## 2022-05-06 NOTE — Progress Notes (Signed)
I have my days  He has lower back pain that has good and bad days.  He is active and has often overdone it at times.   He has no weakness.  He has no new trauma.  He is doing his exercises and taking his medicine.  Spine/Pelvis examination:  Inspection:  Overall, sacoiliac joint benign and hips nontender; without crepitus or defects.   Thoracic spine inspection: Alignment normal without kyphosis present   Lumbar spine inspection:  Alignment  with normal lumbar lordosis, without scoliosis apparent.   Thoracic spine palpation:  without tenderness of spinal processes   Lumbar spine palpation: without tenderness of lumbar area; without tightness of lumbar muscles    Range of Motion:   Lumbar flexion, forward flexion is normal without pain or tenderness    Lumbar extension is full without pain or tenderness   Left lateral bend is normal without pain or tenderness   Right lateral bend is normal without pain or tenderness   Straight leg raising is normal  Strength & tone: normal   Stability overall normal stability  His left shoulder is tender at times but he has full ROM with pain in the extremes.  Encounter Diagnoses  Name Primary?   Chronic midline low back pain with left-sided sciatica Yes   Cigarette nicotine dependence without complication    Chronic left shoulder pain    I have reviewed the [place] web site prior to prescribing narcotic medicine for this patient.  Return in three months.  Call if any problem.  Precautions discussed.  Electronically [place], MD 7/11/20238:03 AM '

## 2022-05-09 ENCOUNTER — Ambulatory Visit (INDEPENDENT_AMBULATORY_CARE_PROVIDER_SITE_OTHER): Payer: Medicare Other

## 2022-05-09 VITALS — BP 139/83

## 2022-05-09 DIAGNOSIS — Z0000 Encounter for general adult medical examination without abnormal findings: Secondary | ICD-10-CM | POA: Diagnosis not present

## 2022-05-09 NOTE — Patient Instructions (Signed)
  Dillon [name] , Thank you for taking time to come for your Medicare Wellness Visit. I appreciate your ongoing commitment to your health goals. Please review the following plan we discussed and let me know if I can assist you in the future.   These are the goals we discussed:  Goals      Quit Smoking     Would like to quit smoking. Has cut way back. GREAT JOB!        This is a list of the screening recommended for you and due dates:  Health Maintenance  Topic Date Due   Zoster (Shingles) Vaccine (1 of 2) Never done   COVID-19 Vaccine (4 - Booster for Moderna series) 12/14/2020   Pneumonia Vaccine (2 - PCV) 01/30/2023*   Tetanus Vaccine  01/30/2023*   Flu Shot  05/27/2022   Colon Cancer Screening  11/04/2026   Hepatitis C Screening: USPSTF Recommendation to screen - Ages 18-79 yo.  Completed   HPV Vaccine  Aged Out  *Topic was postponed. The date shown is not the original due date.

## 2022-05-09 NOTE — Progress Notes (Signed)
I Subjective:   LENORRIS KARGER is a 71 y.o. male who presents for Medicare Annual/Subsequent preventive examination.  Review of Systems     Cardiac Risk Factors include: advanced age (>20mn, >>31women);dyslipidemia;male gender;hypertension;smoking/ tobacco exposure     Objective:    There were no vitals filed for this visit. There is no height or weight on file to calculate BMI.     11/04/2021    9:49 AM 11/01/2021    1:15 PM 05/08/2021    3:36 PM 12/27/2018    9:55 AM 11/07/2018    9:10 AM 10/14/2018    7:19 AM 10/05/2018    7:51 AM  Advanced Directives  Does Patient Have a Medical Advance Directive? No No Yes No No No No  Type of Advance Directive   [place]     Does patient want to make changes to medical advance directive?   No - Patient declined      Copy of [place] Chart?   No - copy requested      Would patient like information on creating a medical advance directive? No - Patient declined No - Patient declined  No - Patient declined  No - Patient declined     Current Medications (verified) Outpatient Encounter Medications as of 05/09/2022  Medication Sig   ALPHAGAN P 0.1 % SOLN Place 1 drop into both eyes in the morning and at bedtime.   amLODipine (NORVASC) 10 MG tablet TAKE 1 TABLET BY MOUTH EVERY DAY   aspirin EC 81 MG tablet Take 81 mg by mouth daily.   atorvastatin (LIPITOR) 80 MG tablet TAKE 1 TABLET BY MOUTH EVERY DAY   BREO ELLIPTA 200-25 MCG/ACT AEPB INHALE 1 PUFF BY MOUTH EVERY DAY   cyclobenzaprine (FLEXERIL) 10 MG tablet TAKE 1 TABLET (10 MG TOTAL) BY MOUTH AT BEDTIME AS NEEDED FOR SPASM.   dorzolamide-timolol (COSOPT) 22.3-6.8 MG/ML ophthalmic solution 1 drop 2 (two) times daily.   gabapentin (NEURONTIN) 300 MG capsule TAKE 1 CAPSULE (300 MG TOTAL) BY MOUTH AT BEDTIME.   HYDROcodone-acetaminophen (NORCO) 7.5-325 MG tablet One tablet every six hours as need for pain.  Must last 28 days.   ipratropium (ATROVENT) 0.02  % nebulizer solution Take 2.5 mLs (0.5 mg total) by nebulization every 6 (six) hours as needed for wheezing or shortness of breath.   latanoprost (XALATAN) 0.005 % ophthalmic solution 1 drop daily.   levalbuterol (XOPENEX) 0.63 MG/3ML nebulizer solution Take 3 mLs (0.63 mg total) by nebulization every 8 (eight) hours as needed for wheezing or shortness of breath.   predniSONE (DELTASONE) 20 MG tablet Take pone tablet two times daily for 3 days, then take one tablet once daily for 4 days then stop (Patient not taking: Reported on 05/06/2022)   RHOPRESSA 0.02 % SOLN Place 1 drop into the right eye at bedtime.   spironolactone (ALDACTONE) 50 MG tablet TAKE 1 TABLET BY MOUTH EVERY DAY   UNABLE TO FIND Nebulizer facemask and tubing x 1  DX J44.9   No facility-administered encounter medications on file as of 05/09/2022.    Allergies (verified) Penicillins   History: Past Medical History:  Diagnosis Date   Arthritis    2012   Asthma    childhood   COPD (chronic obstructive pulmonary disease) (HCC)    Glaucoma    Hyperlipidemia    1990   Hypertension    1990   Nicotine addiction    [place]ipheral artery disease) [place]  2015   Past Surgical History:  Procedure Laterality Date   COLONOSCOPY WITH PROPOFOL N/A 11/04/2021   Procedure: COLONOSCOPY WITH PROPOFOL;  Surgeon: Eloise Harman, DO;  [facility];  Service: Endoscopy;  Laterality: N/A;  9:30am   KNEE ARTHROSCOPY     both knees    LOWER EXTREMITY ANGIOGRAPHY N/A 02/04/2018   Procedure: LOWER EXTREMITY ANGIOGRAPHY;  Surgeon: Conrad Paoli, MD;  Location: [facility];  Service: Cardiovascular;  Laterality: N/A;   POLYPECTOMY  11/04/2021   Procedure: POLYPECTOMY;  Surgeon: Eloise Harman, DO;  Location: [facility];  Service: Endoscopy;;   Family History  Problem Relation Age of Onset   Heart attack Mother    Diabetes Mother    Hypertension Mother    Stroke Brother    Hypertension Sister    Hypertension  Sister    Hypertension Sister    Hypertension Brother    Colon cancer Neg Hx    Social History   Socioeconomic History   Marital status: Single    Spouse name: Not on file   Number of children: Not on file   Years of education: Not on file   Highest education level: Not on file  Occupational History   Not on file  Tobacco Use   Smoking status: Every Day    Years: 48.00    Types: Cigarettes    Start date: 10/16/1969    Passive exposure: Never   Smokeless tobacco: Never  Vaping Use   Vaping Use: Never used  Substance and Sexual Activity   Alcohol use: Yes    Comment: occassional, max of 72 oz pwr month   Drug use: No   Sexual activity: Yes    Partners: Female  Other Topics Concern   Not on file  Social History Narrative   Not on file   Social Determinants of Health   Financial Resource Strain: Low Risk  (05/09/2022)   Overall Financial Resource Strain (CARDIA)    Difficulty of Paying Living Expenses: Not hard at all  Food Insecurity: No Food Insecurity (05/09/2022)   Hunger Vital Sign    Worried About Running Out of Food in the Last Year: Never true    [place] in the Last Year: Never true  Transportation Needs: No Transportation Needs (05/09/2022)   PRAPARE - Hydrologist (Medical): No    Lack of Transportation (Non-Medical): No  Physical Activity: Sufficiently Active (05/09/2022)   Exercise Vital Sign    Days of Exercise per Week: 7 days    Minutes of Exercise per Session: 70 min  Stress: No Stress Concern Present (05/08/2021)   [place]    Feeling of Stress : Not at all  Social Connections: Moderately Isolated (05/09/2022)   Social Connection and Isolation Panel [NHANES]    Frequency of Communication with Friends and Family: Three times a week    Frequency of Social Gatherings with Friends and Family: Twice a week    Attends Religious Services: More than 4 times  per year    Active Member of [employer] or Organizations: No    Attends Archivist Meetings: Never    Marital Status: Never married    Tobacco Counseling Ready to quit: Not Answered Counseling given: Not Answered   Clinical Intake:           Nutritional Status: BMI of 19-24  Normal Diabetes: No  How often  do you need to have someone help you when you read instructions, pamphlets, or other written materials from your doctor or pharmacy?: 1 - Never  Diabetic?no         Activities of Daily Living    05/09/2022    3:24 PM 11/01/2021    1:18 PM  In your present state of health, do you have any difficulty performing the following activities:  Hearing? 0   Vision? 0   Difficulty concentrating or making decisions? 0   Walking or climbing stairs? 0   Dressing or bathing? 0   Doing errands, shopping? 0 0  Preparing Food and eating ? N   Using the Toilet? N   In the past six months, have you accidently leaked urine? N   Do you have problems with loss of bowel control? N   Managing your Medications? N   Managing your Finances? N   Housekeeping or managing your Housekeeping? N     Patient Care Team: Fayrene Helper, MD as PCP - General (Family Medicine) Harl Bowie, Alphonse Guild, MD as Consulting Physician (Cardiology) Magdalen Spatz, NP as Nurse Practitioner (Pulmonary Disease) Sanjuana Kava, MD as Consulting Physician (Orthopedic Surgery) Danie Binder, MD (Inactive) as Consulting Physician (Gastroenterology)  Indicate any recent Medical Services you may have received from other than [facility] providers in the past year (date may be approximate).     Assessment:   This is a routine wellness examination for Wilmon.  Hearing/Vision screen No results found.  Dietary issues and exercise activities discussed: Current Exercise Habits: Home exercise routine, Time (Minutes): > 60, Frequency (Times/Week): 7, Weekly Exercise (Minutes/Week): 0, Intensity: Moderate   Goals  Addressed   None    Depression Screen    01/29/2022    8:07 AM 10/17/2021    9:07 AM 05/17/2021   10:34 AM 05/08/2021    3:36 PM 05/08/2021    3:34 PM 11/29/2020    9:21 AM 04/11/2020    3:26 PM  PHQ 2/9 Scores  PHQ - 2 Score 0 0 0 0 0 0 0    Fall Risk    05/09/2022    3:24 PM 01/29/2022    8:07 AM 10/17/2021    9:06 AM 05/17/2021   10:34 AM 05/08/2021    3:36 PM  Fall Risk   Falls in the past year? 0 0 0 0 0  Number falls in past yr: 0 0 0 0 0  Injury with Fall? 0 0 0 0 0  Risk for fall due to :  No Fall Risks   No Fall Risks  Follow up  Falls evaluation completed   Falls evaluation completed    FALL RISK PREVENTION PERTAINING TO THE HOME:  Any stairs in or around the home? No  If so, are there any without handrails? No  Home free of loose throw rugs in walkways, pet beds, electrical cords, etc? Yes  Adequate lighting in your home to reduce risk of falls? Yes   ASSISTIVE DEVICES UTILIZED TO PREVENT FALLS:  Life alert? No  Use of a cane, walker or w/c? No  Grab bars in the bathroom? No  Shower chair or bench in shower? No  Elevated toilet seat or a handicapped toilet? No   TIMED UP AND GO:  Was the test performed? Yes .  Length of time to ambulate 10 feet: 5 sec.   Gait steady and fast without use of assistive device  Cognitive Function:    05/08/2021  3:37 PM  MMSE - Mini Mental State Exam  Not completed: Unable to complete        05/09/2022    3:25 PM 05/08/2021    3:37 PM  6CIT Screen  What Year? 0 points 0 points  What month? 0 points 0 points  What time? 0 points 0 points  Count back from 20 0 points 0 points  Months in reverse 0 points 0 points  Repeat phrase 2 points 0 points  Total Score 2 points 0 points    Immunizations Immunization History  Administered Date(s) Administered   Moderna Sars-Covid-2 Vaccination 03/08/2020, 04/05/2020, 10/19/2020   Pneumococcal Polysaccharide-23 05/19/2013   Tdap 03/26/2011   Zoster, Live 05/19/2013     TDAP status: Due, Education has been provided regarding the importance of this vaccine. Advised may receive this vaccine at local pharmacy or Health Dept. Aware to provide a copy of the vaccination record if obtained from local pharmacy or Health Dept. Verbalized acceptance and understanding. Pt declined  Flu Vaccine status: Up to date  Pneumococcal vaccine status: Up to date  Covid-19 vaccine status: Completed vaccines  Qualifies for Shingles Vaccine? Yes   Zostavax completed Yes   Shingrix Completed?: No.    Education has been provided regarding the importance of this vaccine. Patient has been advised to call insurance company to determine out of pocket expense if they have not yet received this vaccine. Advised may also receive vaccine at local pharmacy or Health Dept. Verbalized acceptance and understanding.  Screening Tests Health Maintenance  Topic Date Due   Zoster Vaccines- Shingrix (1 of 2) Never done   COVID-19 Vaccine (4 - Booster for Moderna series) 12/14/2020   Pneumonia Vaccine 79+ Years old (2 - PCV) 01/30/2023 (Originally 05/19/2014)   TETANUS/TDAP  01/30/2023 (Originally 03/25/2021)   INFLUENZA VACCINE  05/27/2022   COLONOSCOPY (Pts 45-66yr Insurance coverage will need to be confirmed)  11/04/2026   Hepatitis C Screening  Completed   HPV VACCINES  Aged Out    Health Maintenance  Health Maintenance Due  Topic Date Due   Zoster Vaccines- Shingrix (1 of 2) Never done   COVID-19 Vaccine (4 - Booster for Moderna series) 12/14/2020    Colorectal cancer screening: Type of screening: Colonoscopy. Completed 2023. Repeat every 10 years due 2028  Lung Cancer Screening: (Low Dose CT Chest recommended if Age 70-80years, 30 pack-year currently smoking OR have quit w/in 15years.) does not qualify.   Lung Cancer Screening Referral: na  Additional Screening:  Hepatitis C Screening: does qualify; Completed   Vision Screening: Recommended annual ophthalmology exams for  early detection of glaucoma and other disorders of the eye. Is the patient up to date with their annual eye exam?  Yes  Who is the provider or what is the name of the office in which the patient attends annual [place] exams[place] [facility]  If pt is not established with a provider, would they like to be referred to a provider to establish care? No .   Dental Screening: Recommended annual dental exams for proper oral hygiene  Community Resource Referral / Chronic Care Management: CRR required this visit?  No   CCM required this visit?  No      Plan:     I have personally reviewed and noted the following in the patient's chart:   Medical and social history Use of alcohol, tobacco or illicit drugs  Current medications and supplements including opioid prescriptions. Patient is not currently  taking opioid prescriptions. Functional ability and status Nutritional status Physical activity Advanced directives List of other physicians Hospitalizations, surgeries, and ER visits in previous 12 months Vitals Screenings to include cognitive, depression, and falls Referrals and appointments  In addition, I have reviewed and discussed with patient certain preventive protocols, quality metrics, and best practice recommendations. A written personalized care plan for preventive services as well as general preventive health recommendations were provided to patient.     Eual Fines, LPN   0/23/0172   Nurse Notes: Schedule your next wellness visit for 1 year at checkout

## 2022-05-25 ENCOUNTER — Other Ambulatory Visit: Payer: Self-pay | Admitting: Family Medicine

## 2022-06-03 ENCOUNTER — Telehealth: Payer: Self-pay

## 2022-06-03 MED ORDER — HYDROCODONE-ACETAMINOPHEN 7.5-325 MG PO TABS: ORAL_TABLET | ORAL | 0 refills | Status: DC

## 2022-06-03 NOTE — Telephone Encounter (Signed)
Hydrocodone-Acetaminophen 7.5/325 MG Qty 110 Tablets  PATIENT USES [facility] 

## 2022-06-05 ENCOUNTER — Other Ambulatory Visit: Payer: Self-pay

## 2022-06-05 ENCOUNTER — Telehealth: Payer: Self-pay

## 2022-06-05 DIAGNOSIS — I10 Essential (primary) hypertension: Secondary | ICD-10-CM

## 2022-06-05 MED ORDER — AMLODIPINE BESYLATE 10 MG PO TABS: 10.0000 mg | ORAL_TABLET | Freq: Every day | ORAL | 1 refills | Status: DC

## 2022-06-05 MED ORDER — SPIRONOLACTONE 50 MG PO TABS: 50.0000 mg | ORAL_TABLET | Freq: Every day | ORAL | 1 refills | Status: DC

## 2022-06-05 NOTE — Telephone Encounter (Signed)
Patient called need med refills took last pill today.  amLODipine (NORVASC) 10 MG tablet   spironolactone (ALDACTONE) 50 MG tablet   Ph[facility]acy: [facility]

## 2022-06-05 NOTE — Telephone Encounter (Signed)
Refill sent.

## 2022-06-26 DIAGNOSIS — H401113 Primary open-angle glaucoma, right eye, severe stage: Secondary | ICD-10-CM | POA: Diagnosis not present

## 2022-06-26 DIAGNOSIS — H401122 Primary open-angle glaucoma, left eye, moderate stage: Secondary | ICD-10-CM | POA: Diagnosis not present

## 2022-07-08 ENCOUNTER — Telehealth: Payer: Self-pay | Admitting: Orthopaedic Surgery

## 2022-07-09 MED ORDER — HYDROCODONE-ACETAMINOPHEN 7.5-325 MG PO TABS: ORAL_TABLET | ORAL | 0 refills | Status: DC

## 2022-07-31 ENCOUNTER — Ambulatory Visit (INDEPENDENT_AMBULATORY_CARE_PROVIDER_SITE_OTHER): Payer: Medicare Other | Admitting: Family Medicine

## 2022-07-31 ENCOUNTER — Encounter: Payer: Self-pay | Admitting: *Deleted

## 2022-07-31 ENCOUNTER — Encounter: Payer: Self-pay | Admitting: Family Medicine

## 2022-07-31 ENCOUNTER — Telehealth: Payer: Self-pay | Admitting: *Deleted

## 2022-07-31 VITALS — BP 128/83 | HR 81 | Ht 74.0 in | Wt 154.0 lb

## 2022-07-31 DIAGNOSIS — R9431 Abnormal electrocardiogram [ECG] [EKG]: Secondary | ICD-10-CM

## 2022-07-31 DIAGNOSIS — I10 Essential (primary) hypertension: Secondary | ICD-10-CM | POA: Diagnosis not present

## 2022-07-31 DIAGNOSIS — I70211 Atherosclerosis of native arteries of extremities with intermittent claudication, right leg: Secondary | ICD-10-CM | POA: Diagnosis not present

## 2022-07-31 DIAGNOSIS — I2510 Atherosclerotic heart disease of native coronary artery without angina pectoris: Secondary | ICD-10-CM | POA: Diagnosis not present

## 2022-07-31 DIAGNOSIS — F17200 Nicotine dependence, unspecified, uncomplicated: Secondary | ICD-10-CM | POA: Diagnosis not present

## 2022-07-31 DIAGNOSIS — E785 Hyperlipidemia, unspecified: Secondary | ICD-10-CM | POA: Diagnosis not present

## 2022-07-31 DIAGNOSIS — F17210 Nicotine dependence, cigarettes, uncomplicated: Secondary | ICD-10-CM

## 2022-07-31 DIAGNOSIS — R0789 Other chest pain: Secondary | ICD-10-CM

## 2022-07-31 DIAGNOSIS — R972 Elevated prostate specific antigen [PSA]: Secondary | ICD-10-CM | POA: Diagnosis not present

## 2022-07-31 MED ORDER — ERGOCALCIFEROL 1.25 MG (50000 UT) PO CAPS: 50000.0000 [IU] | ORAL_CAPSULE | ORAL | 2 refills | Status: DC

## 2022-07-31 NOTE — Telephone Encounter (Signed)
Attempted to contact pt at both home and cell numbers to schedule follow up lung screening CT but was unable to leave a message. Letter mailed to pt asking him to contact office to schedule.

## 2022-07-31 NOTE — Assessment & Plan Note (Signed)
Controlled, no change in medication DASH diet and commitment to daily physical activity for a minimum of 30 minutes discussed and encouraged, as a part of hypertension management. The importance of attaining a healthy weight is also discussed.     07/31/2022    8:06 AM 05/09/2022    3:29 PM 03/06/2022    9:33 AM 01/29/2022    8:06 AM 01/28/2022    7:55 AM 11/04/2021   10:58 AM 11/04/2021   10:03 AM  BP/Weight  Systolic BP 681 157 262 035 597 99 416  Diastolic BP 83 83 84 77 75 57 99  Wt. (Lbs) 154  155.2 156.04 160    BMI 19.77 kg/m2  19.93 kg/m2 20.03 kg/m2 20.27 kg/m2

## 2022-07-31 NOTE — Assessment & Plan Note (Signed)
3 month h/o increased exertional fatigue, ongoing nicotine

## 2022-07-31 NOTE — Progress Notes (Signed)
Dillon [name]     MRN: [id number]      DOB: 11-18-1950   HPI Mr. Diltz is here for follow up and re-evaluation of chronic medical conditions, medication management and review of any available recent lab and radiology data.  Preventive health is updated, specifically  Cancer screening and Immunization.   Questions or concerns regarding consultations or procedures which the PT has had in the interim are  addressed. The PT denies any adverse reactions to current medications since the last visit.  3 month h/o exertional fatigue, on avg 3 days per week, last  night had an episode of left chest pain and ': " felt funny' about 10 mins duration  ROS Denies recent fever or chills. Denies sinus pressure, nasal congestion, ear pain or sore throat. Denies chest congestion, productive cough or wheezing. Denies PND, orthopnea, palpitations and leg swelling Denies abdominal pain, nausea, vomiting,diarrhea or constipation.   Denies dysuria, frequency, hesitancy or incontinence. Denies uncontrolled  joint pain, swelling and limitation in mobility. Denies headaches, seizures, numbness, or tingling. Denies depression, anxiety or insomnia. Denies skin break down or rash.   PE  BP 128/83 (BP Location: Right Arm, Patient Position: Sitting)   Pulse 81   Ht '6\' 2"'$  (1.88 m)   Wt 154 lb (69.9 kg)   SpO2 95%   BMI 19.77 kg/m   Patient alert and oriented and in no cardiopulmonary distress.  HEENT: No facial asymmetry, EOMI,     Neck supple .  Chest: Clear to auscultation bilaterally.decreased air entry EKG: NSR, non specific t wave abnormality CVS: S1, S2 no murmurs, no S3.Regular rate.  ABD: Soft non tender.   Ext: No edema  MS: Adequate though reduced ROM lumbar spine, normal in shoulders, hips and knees.  Skin: Intact, no ulcerations or rash noted.  Psych: Good eye contact, normal affect. Memory intact not anxious or depressed appearing.  CNS: CN 2-12 intact, power,  normal throughout.no  focal deficits noted.   Assessment & Plan  Chest pain Acute episode of left chest pain 1 day ago at rest, has noted also 3 month h/o increased exertional fatigue and has arterial disease and still smokes , refer card, an d abn in office EKG at visit  CAD (coronary atherosclerotic disease) 3 month h/o increased exertional fatigue, ongoing nicotine  Essential hypertension Controlled, no change in medication DASH diet and commitment to daily physical activity for a minimum of 30 minutes discussed and encouraged, as a part of hypertension management. The importance of attaining a healthy weight is also discussed.     07/31/2022    8:06 AM 05/09/2022    3:29 PM 03/06/2022    9:33 AM 01/29/2022    8:06 AM 01/28/2022    7:55 AM 11/04/2021   10:58 AM 11/04/2021   10:03 AM  BP/Weight  Systolic BP 329 924 268 341 962 99 229  Diastolic BP 83 83 84 77 75 57 99  Wt. (Lbs) 154  155.2 156.04 160    BMI 19.77 kg/m2  19.93 kg/m2 20.03 kg/m2 20.27 kg/m2          NICOTINE ADDICTION Asked:confirms currently smokes cigarettes Assess: Unwilling to set a quit date, but is cutting back Advise: needs to QUIT to reduce risk of cancer, cardio and cerebrovascular disease Assist: counseled for 5 minutes and literature provided Arrange: follow up in 2 to 4 months Refer for screening chest scan  Hyperlipidemia LDL goal <70 Hyperlipidemia:Low fat diet discussed and encouraged.   Lipid  Panel  Lab Results  Component Value Date   CHOL 222 (H) 07/31/2022   HDL 82 07/31/2022   LDLCALC 118 (H) 07/31/2022   TRIG 128 07/31/2022   CHOLHDL 2.7 07/31/2022     Uncontrolled needs to  lower fat intake, liver enz x 1 elevated , no med dose change   Elevated PSA Followed by urology

## 2022-07-31 NOTE — Assessment & Plan Note (Signed)
Acute episode of left chest pain 1 day ago at rest, has noted also 3 month h/o increased exertional fatigue and has arterial disease and still smokes , refer card, an d abn in office EKG at visit

## 2022-07-31 NOTE — Patient Instructions (Addendum)
F/U in 6 months, call if you need me sooner  EKG in office due to new chest discomfort and fatigue  You are referred to Cardiology  Needs screening chest lung CT , over 20 pack year history and current smoker  Lipid, cmp and eGFR today also cBC and tSH  Reconsider vaccines, you need them  New is once weekly vit D  Need to quit smoking  Thanks for choosing [facility], we consider it a privelige to serve you.

## 2022-08-01 ENCOUNTER — Encounter: Payer: Self-pay | Admitting: Family Medicine

## 2022-08-01 LAB — LIPID PANEL
Chol/HDL Ratio: 2.7 ratio (ref 0.0–5.0)
Cholesterol, Total: 222 mg/dL — ABNORMAL HIGH (ref 100–199)
HDL: 82 mg/dL (ref >39)
LDL Chol Calc (NIH): 118 mg/dL — ABNORMAL HIGH (ref 0–99)
Triglycerides: 128 mg/dL (ref 0–149)
VLDL Cholesterol Cal: 22 mg/dL (ref 5–40)

## 2022-08-01 LAB — CMP14+EGFR
ALT: 25 IU/L (ref 0–44)
AST: 46 IU/L — ABNORMAL HIGH (ref 0–40)
Albumin/Globulin Ratio: 1.7 (ref 1.2–2.2)
Albumin: 5 g/dL — ABNORMAL HIGH (ref 3.9–4.9)
Alkaline Phosphatase: 92 IU/L (ref 44–121)
BUN/Creatinine Ratio: 7 — ABNORMAL LOW (ref 10–24)
BUN: 8 mg/dL (ref 8–27)
Bilirubin Total: <0.2 mg/dL (ref 0.0–1.2)
CO2: 25 mmol/L (ref 20–29)
Calcium: 9.9 mg/dL (ref 8.6–10.2)
Chloride: 98 mmol/L (ref 96–106)
Creatinine, Ser: 1.07 mg/dL (ref 0.76–1.27)
Globulin, Total: 3 g/dL (ref 1.5–4.5)
Glucose: 91 mg/dL (ref 70–99)
Potassium: 4.9 mmol/L (ref 3.5–5.2)
Sodium: 140 mmol/L (ref 134–144)
Total Protein: 8 g/dL (ref 6.0–8.5)
eGFR: 75 mL/min/{1.73_m2} (ref >59)

## 2022-08-01 LAB — CBC
Hematocrit: 36.5 % — ABNORMAL LOW (ref 37.5–51.0)
Hemoglobin: 12.5 g/dL — ABNORMAL LOW (ref 13.0–17.7)
MCH: 29.5 pg (ref 26.6–33.0)
MCHC: 34.2 g/dL (ref 31.5–35.7)
MCV: 86 fL (ref 79–97)
Platelets: 429 10*3/uL (ref 150–450)
RBC: 4.24 x10E6/uL (ref 4.14–5.80)
RDW: 14.5 % (ref 11.6–15.4)
WBC: 5.2 10*3/uL (ref 3.4–10.8)

## 2022-08-01 LAB — TSH: TSH: 1.69 u[IU]/mL (ref 0.450–4.500)

## 2022-08-01 NOTE — Telephone Encounter (Signed)
Attempt to return call again but no answer and no VM

## 2022-08-01 NOTE — Telephone Encounter (Signed)
Returned call to patient from VM to schedule LDCT. No answer and unable to leave message

## 2022-08-03 ENCOUNTER — Encounter: Payer: Self-pay | Admitting: Family Medicine

## 2022-08-03 NOTE — Assessment & Plan Note (Signed)
Asked:confirms currently smokes cigarettes Assess: Unwilling to set a quit date, but is cutting back Advise: needs to QUIT to reduce risk of cancer, cardio and cerebrovascular disease Assist: counseled for 5 minutes and literature provided Arrange: follow up in 2 to 4 months Refer for screening chest scan

## 2022-08-03 NOTE — Assessment & Plan Note (Signed)
Followed by urology.   

## 2022-08-03 NOTE — Assessment & Plan Note (Addendum)
Hyperlipidemia:Low fat diet discussed and encouraged.   Lipid Panel  Lab Results  Component Value Date   CHOL 222 (H) 07/31/2022   HDL 82 07/31/2022   LDLCALC 118 (H) 07/31/2022   TRIG 128 07/31/2022   CHOLHDL 2.7 07/31/2022     Uncontrolled needs to  lower fat intake, liver enz x 1 elevated , no med dose change

## 2022-08-06 ENCOUNTER — Ambulatory Visit: Payer: Medicare Other | Admitting: Orthopaedic Surgery

## 2022-08-07 ENCOUNTER — Telehealth: Payer: Self-pay | Admitting: Orthopaedic Surgery

## 2022-08-11 MED ORDER — HYDROCODONE-ACETAMINOPHEN 7.5-325 MG PO TABS: ORAL_TABLET | ORAL | 0 refills | Status: DC

## 2022-08-12 ENCOUNTER — Ambulatory Visit: Payer: Medicare Other | Admitting: Orthopaedic Surgery

## 2022-08-27 ENCOUNTER — Ambulatory Visit (INDEPENDENT_AMBULATORY_CARE_PROVIDER_SITE_OTHER): Payer: Medicare Other | Admitting: Orthopaedic Surgery

## 2022-08-27 ENCOUNTER — Encounter: Payer: Self-pay | Admitting: Orthopaedic Surgery

## 2022-08-27 VITALS — BP 146/83 | HR 88 | Ht 74.5 in | Wt 162.0 lb

## 2022-08-27 DIAGNOSIS — I70211 Atherosclerosis of native arteries of extremities with intermittent claudication, right leg: Secondary | ICD-10-CM

## 2022-08-27 DIAGNOSIS — F17210 Nicotine dependence, cigarettes, uncomplicated: Secondary | ICD-10-CM

## 2022-08-27 DIAGNOSIS — G8929 Other chronic pain: Secondary | ICD-10-CM

## 2022-08-27 DIAGNOSIS — M5442 Lumbago with sciatica, left side: Secondary | ICD-10-CM

## 2022-08-27 MED ORDER — CYCLOBENZAPRINE HCL 10 MG PO TABS: ORAL_TABLET | ORAL | 3 refills | Status: DC

## 2022-08-27 NOTE — Progress Notes (Signed)
My back is the same.  He has chronic lower back pain.  He has no new trauma.  He is active and doing his exercises.  He is taking his medicine.  He has no weakness.  Spine/Pelvis examination:  Inspection:  Overall, sacoiliac joint benign and hips nontender; without crepitus or defects.   Thoracic spine inspection: Alignment normal without kyphosis present   Lumbar spine inspection:  Alignment  with normal lumbar lordosis, without scoliosis apparent.   Thoracic spine palpation:  without tenderness of spinal processes   Lumbar spine palpation: without tenderness of lumbar area; without tightness of lumbar muscles    Range of Motion:   Lumbar flexion, forward flexion is normal without pain or tenderness    Lumbar extension is full without pain or tenderness   Left lateral bend is normal without pain or tenderness   Right lateral bend is normal without pain or tenderness   Straight leg raising is normal  Strength & tone: normal   Stability overall normal stability  Encounter Diagnoses  Name Primary?   Chronic midline low back pain with left-sided sciatica Yes   Cigarette nicotine dependence without complication    I will refill Flexeril.  Return in three months.  Call if any problem.  Precautions discussed.  Electronically Signed Sanjuana Kava, MD 11/1/20238:14 AM

## 2022-09-06 ENCOUNTER — Telehealth: Payer: Self-pay | Admitting: Orthopaedic Surgery

## 2022-09-08 MED ORDER — HYDROCODONE-ACETAMINOPHEN 7.5-325 MG PO TABS: ORAL_TABLET | ORAL | 0 refills | Status: DC

## 2022-09-24 ENCOUNTER — Other Ambulatory Visit: Payer: Self-pay | Admitting: Family Medicine

## 2022-10-09 ENCOUNTER — Encounter: Payer: Self-pay | Admitting: Cardiology

## 2022-10-09 ENCOUNTER — Ambulatory Visit: Payer: Medicare Other | Attending: Cardiology | Admitting: Cardiology

## 2022-10-09 VITALS — BP 136/72 | HR 66 | Ht 74.5 in | Wt 154.0 lb

## 2022-10-09 DIAGNOSIS — R0789 Other chest pain: Secondary | ICD-10-CM | POA: Insufficient documentation

## 2022-10-09 DIAGNOSIS — I70211 Atherosclerosis of native arteries of extremities with intermittent claudication, right leg: Secondary | ICD-10-CM | POA: Diagnosis not present

## 2022-10-09 DIAGNOSIS — R0609 Other forms of dyspnea: Secondary | ICD-10-CM | POA: Diagnosis not present

## 2022-10-09 NOTE — Progress Notes (Signed)
Clinical Summary Dillon [name] is a 71 y.o.male seen today for follow up of the following medical problems.      1.CAD - CT scan chest 01/2014 with incidental finding of coronary calcification - CAD risk factors: HTN, tobacco, FH with sister stents in her 20s, hyperlipidemia - 06/2015 nuclear stress no ischemia      - 07/31/22 note from pcp recent chest pain - ongoing several months - scratchy like feeling midchest. Can occur at rest or with activity. No relation to food. +SOB, some nausea. Lasts just a few minutes. Not positional - baking soda helps.   - no exertional chest pains, some DOE with activities which new x 1 year.  - no recent edema, no cough but occasioanl wheezing.  - not able to travel to [place]. Cannot runo n treadmill due to PAD.      Other medical problems not addressed this visit   2. HTN - he is compliant with meds     3. HL 09/2017 TC 174 HDL 59 TG 46 LDL 101 - 03/2018 TC 190 TG 74 HDL 61 LDL 113 - he is compliant with statin   4. PAD - followed by vascular - prior workup including ABIs, lower extremity angio - no recent symptoms   5. COPD - compliant with meds   6. Carotid stenosis - 11/2017 carotid US: 62-13% RICA, LICA <08%   Past Medical History:  Diagnosis Date   Arthritis    2012   Asthma    childhood   COPD (chronic obstructive pulmonary disease) (HCC)    Glaucoma    Hyperlipidemia    1990   Hypertension    1990   Nicotine addiction    PAD (peripheral artery disease) (HCC) 2015     Allergies  Allergen Reactions   Penicillins Hives and Other (See Comments)    Has patient had a PCN reaction causing immediate rash, facial/tongue/throat swelling, SOB or lightheadedness with hypotension: Unknown Has patient had a PCN reaction causing severe rash involving mucus membranes or skin necrosis: No Has patient had a PCN reaction that required hospitalization: Yes Has patient had a PCN reaction occurring within the last 10  years: No If all of the above answers are "NO", then may proceed with Cephalosporin use.      Current Outpatient Medications  Medication Sig Dispense Refill   HYDROcodone-acetaminophen (NORCO) 7.5-325 MG tablet One tablet every six hours as need for pain.  Must last 28 days. 110 tablet 0   ALPHAGAN P 0.1 % SOLN Place 1 drop into both eyes in the morning and at bedtime.     amLODipine (NORVASC) 10 MG tablet Take 1 tablet (10 mg total) by mouth daily. 90 tablet 1   aspirin EC 81 MG tablet Take 81 mg by mouth daily.     atorvastatin (LIPITOR) 80 MG tablet TAKE 1 TABLET BY MOUTH EVERY DAY 90 tablet 3   BREO ELLIPTA 200-25 MCG/ACT AEPB INHALE 1 PUFF BY MOUTH EVERY DAY 60 each 5   cyclobenzaprine (FLEXERIL) 10 MG tablet TAKE 1 TABLET (10 MG TOTAL) BY MOUTH AT BEDTIME AS NEEDED FOR SPASM. 30 tablet 3   dorzolamide-timolol (COSOPT) 22.3-6.8 MG/ML ophthalmic solution 1 drop 2 (two) times daily.     ergocalciferol (VITAMIN D2) 1.25 MG (50000 UT) capsule Take 1 capsule (50,000 Units total) by mouth once a week. One capsule once weekly 12 capsule 2   gabapentin (NEURONTIN) 300 MG capsule TAKE 1 CAPSULE (300 MG TOTAL)  BY MOUTH AT BEDTIME. 30 capsule 2   ipratropium (ATROVENT) 0.02 % nebulizer solution Take 2.5 mLs (0.5 mg total) by nebulization every 6 (six) hours as needed for wheezing or shortness of breath. 75 mL 12   latanoprost (XALATAN) 0.005 % ophthalmic solution 1 drop daily.     levalbuterol (XOPENEX) 0.63 MG/3ML nebulizer solution Take 3 mLs (0.63 mg total) by nebulization every 8 (eight) hours as needed for wheezing or shortness of breath. 3 mL 12   RHOPRESSA 0.02 % SOLN Place 1 drop into the right eye at bedtime.     spironolactone (ALDACTONE) 50 MG tablet Take 1 tablet (50 mg total) by mouth daily. 90 tablet 1   UNABLE TO FIND Nebulizer facemask and tubing x 1  DX J44.9 1 each 0   No current facility-administered medications for this visit.     Past Surgical History:  Procedure  Laterality Date   COLONOSCOPY WITH PROPOFOL N/A 11/04/2021   Procedure: COLONOSCOPY WITH PROPOFOL;  Surgeon: Eloise Harman, DO;  [facility];  Service: Endoscopy;  Laterality: N/A;  9:30am   KNEE ARTHROSCOPY     both knees    LOWER EXTREMITY ANGIOGRAPHY N/A 02/04/2018   Procedure: LOWER EXTREMITY ANGIOGRAPHY;  Surgeon: Conrad Loomis, MD;  Location: [facility];  Service: Cardiovascular;  Laterality: N/A;   POLYPECTOMY  11/04/2021   Procedure: POLYPECTOMY;  Surgeon: Eloise Harman, DO;  Location: [facility];  Service: Endoscopy;;     Allergies  Allergen Reactions   Penicillins Hives and Other (See Comments)    Has patient had a PCN reaction causing immediate rash, facial/tongue/throat swelling, SOB or lightheadedness with hypotension: Unknown Has patient had a PCN reaction causing severe rash involving mucus membranes or skin necrosis: No Has patient had a PCN reaction that required hospitalization: Yes Has patient had a PCN reaction occurring within the last 10 years: No If all of the above answers are "NO", then may proceed with Cephalosporin use.       Family History  Problem Relation Age of Onset   Heart attack Mother    Diabetes Mother    Hypertension Mother    Stroke Brother    Hypertension Sister    Hypertension Sister    Hypertension Sister    Hypertension Brother    Colon cancer Neg Hx      Social History Dillon [name] reports that he has been smoking cigarettes. He started smoking about 53 years ago. He has never been exposed to tobacco smoke. He has never used smokeless tobacco. Dillon [name] reports current alcohol use.   Review of Systems CONSTITUTIONAL: No weight loss, fever, chills, weakness or fatigue.  HEENT: Eyes: No visual loss, blurred vision, double vision or yellow sclerae.No hearing loss, sneezing, congestion, runny nose or sore throat.  SKIN: No rash or itching.  CARDIOVASCULAR: per hpi RESPIRATORY: per hpi GASTROINTESTINAL:  No anorexia, nausea, vomiting or diarrhea. No abdominal pain or blood.  GENITOURINARY: No burning on urination, no polyuria NEUROLOGICAL: No headache, dizziness, syncope, paralysis, ataxia, numbness or tingling in the extremities. No change in bowel or bladder control.  MUSCULOSKELETAL: No muscle, back pain, joint pain or stiffness.  LYMPHATICS: No enlarged nodes. No history of splenectomy.  PSYCHIATRIC: No history of depression or anxiety.  ENDOCRINOLOGIC: No reports of sweating, cold or heat intolerance. No polyuria or polydipsia.  Marland Kitchen   Physical Examination Today's Vitals   10/09/22 1458  BP: 136/72  Pulse: 66  SpO2: 100%  Weight:  154 lb (69.9 kg)  Height: 6' 2.5" (1.892 m)   Body mass index is 19.51 kg/m.  Gen: resting comfortably, no acute distress HEENT: no scleral icterus, pupils equal round and reactive, no palptable cervical adenopathy,  CV: RRR, no m/r/g no jvd Resp: Clear to auscultation bilaterally GI: abdomen is soft, non-tender, non-distended, normal bowel sounds, no hepatosplenomegaly MSK: extremities are warm, no edema.  Skin: warm, no rash Neuro:  no focal deficits Psych: appropriate affect   Diagnostic Studies  06/2015 exercise nuclear stress Exercise limited by possible right leg claudication. There was no diagnostic ST segment deviation noted during Lexiscan infusion. Blood pressure demonstrated a hypertensive response to exercise. There is a small defect of mild severity present in the mid inferoseptal, apical septal and apical inferior location. The defect is non-reversible. Most consistent with soft tissue attenuation, no clear evidence of scar or ischemia. This is a low risk study. Nuclear stress EF: 59%.     08/2018 ABI Right: Resting right ankle-brachial index indicates moderate right lower extremity arterial disease. The right toe-brachial index is abnormal. RT great toe pressure = 55 mmHg.   Left: Resting left ankle-brachial index is within  normal range. No evidence of significant left lower extremity arterial disease. The left toe-brachial index is abnormal. LT Great toe pressure = 82 mmHg.   01/2018 LE angiography Ectatic segments suggestive of abdominal aortic aneurysm and bilateral common iliac artery aneurysms Occluded right SFA with collateral reconstitution of proximal AT and TPT Patent but diseased L SFA with serial stenosis in mid-segment and sub-total occlusion distally leading into intact popliteal flow into PT and peroneal Additional studies will be needed to interrogate the anatomy of the aortoiliac arteries and bilateral femoropopliteal arteries Right leg minimally needs R common femoral artery to distal popliteal artery bypass with popliteal artery aneurysm exclusion   11/2018 carotid US IMPRESSION: There is 50-69% stenosis in the right internal carotid artery.   Less than 50% stenosis in the left internal carotid artery.   Assessment and Plan    1. Chest pain/DOE - chest pain symptoms very atypical however recent DOE in setting of known PAD and other risk factors - will start workup with echo, pending results would plan for lexiscan. Not able to run on treadmill due to PAD and claudication, travel to [address] not easy for him for a CTA       Dillon [name], M.D.

## 2022-10-09 NOTE — Patient Instructions (Signed)
Medication Instructions:  Your physician recommends that you continue on your current medications as directed. Please refer to the Current Medication list given to you today.   Labwork: None  Testing/Procedures: Your physician has requested that you have an echocardiogram. Echocardiography is a painless test that uses sound waves to create images of your heart. It provides your doctor with information about the size and shape of your heart and how well your heart's chambers and valves are working. This procedure takes approximately one hour. There are no restrictions for this procedure. Please do NOT wear cologne, perfume, aftershave, or lotions (deodorant is allowed). Please arrive 15 minutes prior to your appointment time.   Follow-Up: Follow up is pending test results   Any Other Special Instructions Will Be Listed Below (If Applicable).     If you need a refill on your cardiac medications before your next appointment, please call your pharmacy.

## 2022-10-13 ENCOUNTER — Telehealth: Payer: Self-pay

## 2022-10-13 MED ORDER — HYDROCODONE-ACETAMINOPHEN 7.5-325 MG PO TABS: ORAL_TABLET | ORAL | 0 refills | Status: DC

## 2022-10-13 NOTE — Telephone Encounter (Signed)
Refill request sent to provider to review upon their return to office tomorrow.

## 2022-10-13 NOTE — Telephone Encounter (Signed)
Hydrocodone-Acetaminophen 7.5/325 MG Qty 110 Tablets  PATIENT USES [facility] 

## 2022-11-04 ENCOUNTER — Ambulatory Visit (HOSPITAL_COMMUNITY): Admission: RE | Admit: 2022-11-04 | Payer: Medicare HMO | Source: Ambulatory Visit

## 2022-11-10 ENCOUNTER — Telehealth: Payer: Self-pay | Admitting: Orthopaedic Surgery

## 2022-11-10 MED ORDER — HYDROCODONE-ACETAMINOPHEN 7.5-325 MG PO TABS: ORAL_TABLET | ORAL | 0 refills | Status: DC

## 2022-11-24 ENCOUNTER — Ambulatory Visit (HOSPITAL_COMMUNITY): Payer: Medicare HMO

## 2022-11-27 ENCOUNTER — Encounter: Payer: Self-pay | Admitting: Orthopaedic Surgery

## 2022-11-27 ENCOUNTER — Ambulatory Visit: Payer: Medicare HMO | Admitting: Orthopaedic Surgery

## 2022-11-27 VITALS — BP 148/81 | HR 77

## 2022-11-27 DIAGNOSIS — M5442 Lumbago with sciatica, left side: Secondary | ICD-10-CM

## 2022-11-27 DIAGNOSIS — G8929 Other chronic pain: Secondary | ICD-10-CM

## 2022-11-27 DIAGNOSIS — M25512 Pain in left shoulder: Secondary | ICD-10-CM

## 2022-11-27 NOTE — Progress Notes (Signed)
My back and shoulder hurt with the cold weather.  He has had more pain in the back with the cold weather and the rain we have had.  He has no new trauma, no weakness.  He has pain with the left shoulder with overhead use.  Spine/Pelvis examination:  Inspection:  Overall, sacoiliac joint benign and hips nontender; without crepitus or defects.   Thoracic spine inspection: Alignment normal without kyphosis present   Lumbar spine inspection:  Alignment  with normal lumbar lordosis, without scoliosis apparent.   Thoracic spine palpation:  without tenderness of spinal processes   Lumbar spine palpation: without tenderness of lumbar area; without tightness of lumbar muscles    Range of Motion:   Lumbar flexion, forward flexion is normal without pain or tenderness    Lumbar extension is full without pain or tenderness   Left lateral bend is normal without pain or tenderness   Right lateral bend is normal without pain or tenderness   Straight leg raising is normal  Strength & tone: normal   Stability overall normal stability  Encounter Diagnoses  Name Primary?   Chronic midline low back pain with left-sided sciatica Yes   Chronic left shoulder pain    Return in three months.  Continue medicine and exercises.  Call if any problem.  Precautions discussed.  Electronically Signed Sanjuana Kava, MD 2/1/20248:23 AM

## 2022-11-28 ENCOUNTER — Ambulatory Visit (HOSPITAL_COMMUNITY)
Admission: RE | Admit: 2022-11-28 | Discharge: 2022-11-28 | Disposition: A | Discharge status: Home / Self Care | Payer: Medicare HMO | Source: Ambulatory Visit | Attending: Cardiology | Admitting: Cardiology

## 2022-11-28 DIAGNOSIS — R0609 Other forms of dyspnea: Secondary | ICD-10-CM | POA: Diagnosis not present

## 2022-11-28 LAB — ECHOCARDIOGRAM COMPLETE
Area-P 1/2: 3.08 cm2
S' Lateral: 2.6 cm

## 2022-11-28 NOTE — Progress Notes (Signed)
*  PRELIMINARY RESULTS* Echocardiogram 2D Echocardiogram has been performed.  Dillon [name] 11/28/2022, 11:32 AM

## 2022-12-01 ENCOUNTER — Telehealth: Payer: Self-pay

## 2022-12-01 DIAGNOSIS — R079 Chest pain, unspecified: Secondary | ICD-10-CM

## 2022-12-01 DIAGNOSIS — R0789 Other chest pain: Secondary | ICD-10-CM

## 2022-12-01 NOTE — Telephone Encounter (Signed)
Patient notified and verbalized understanding. Patient had no questions or concerns at this time.  

## 2022-12-01 NOTE — Telephone Encounter (Signed)
-----   Message from Arnoldo Lenis, MD sent at 11/30/2022  6:01 PM EST ----- Normal echo, please order lexiscan for chest pain  J BrancH MD

## 2022-12-04 ENCOUNTER — Other Ambulatory Visit: Payer: Self-pay | Admitting: Family Medicine

## 2022-12-04 DIAGNOSIS — I10 Essential (primary) hypertension: Secondary | ICD-10-CM

## 2022-12-11 ENCOUNTER — Telehealth: Payer: Self-pay

## 2022-12-11 MED ORDER — HYDROCODONE-ACETAMINOPHEN 7.5-325 MG PO TABS: ORAL_TABLET | ORAL | 0 refills | Status: DC

## 2022-12-11 NOTE — Telephone Encounter (Signed)
Hydrocodone-Acetaminophen 7.5/325 MG Qty 110 Tablets  PATIENT USES [facility]

## 2022-12-17 ENCOUNTER — Telehealth: Payer: Self-pay | Admitting: Cardiology

## 2022-12-17 NOTE — Telephone Encounter (Signed)
Checking percert on the following patient for testing scheduled at [facility].    LEXISCAN   12/23/2022

## 2022-12-23 ENCOUNTER — Encounter (HOSPITAL_COMMUNITY)
Admission: RE | Admit: 2022-12-23 | Discharge: 2022-12-23 | Disposition: A | Discharge status: Home / Self Care | Payer: Medicare HMO | Source: Ambulatory Visit | Attending: Cardiology | Admitting: Cardiology

## 2022-12-23 ENCOUNTER — Ambulatory Visit (HOSPITAL_COMMUNITY)
Admission: RE | Admit: 2022-12-23 | Discharge: 2022-12-23 | Disposition: A | Discharge status: Home / Self Care | Payer: Medicare HMO | Source: Ambulatory Visit | Attending: Cardiology | Admitting: Cardiology

## 2022-12-23 DIAGNOSIS — R079 Chest pain, unspecified: Secondary | ICD-10-CM | POA: Diagnosis not present

## 2022-12-23 DIAGNOSIS — R0789 Other chest pain: Secondary | ICD-10-CM

## 2022-12-23 LAB — NM MYOCAR MULTI W/SPECT W/WALL MOTION / EF
LV dias vol: 97 mL (ref 62–150)
LV sys vol: 38 mL
Nuc Stress EF: 61 %
Peak HR: 83 {beats}/min
RATE: 0.3
Rest HR: 62 {beats}/min
Rest Nuclear Isotope Dose: 10.5 mCi
SDS: 0
SRS: 0
SSS: 0
ST Depression (mm): 0 mm
Stress Nuclear Isotope Dose: 31.4 mCi
TID: 1.21

## 2022-12-23 MED ORDER — REGADENOSON 0.4 MG/5ML IV SOLN
INTRAVENOUS | Status: AC
  Administered 2022-12-23: 0.4 mg via INTRAVENOUS
  Filled 2022-12-23: qty 5

## 2022-12-23 MED ORDER — TECHNETIUM TC 99M TETROFOSMIN IV KIT
31.4000 | PACK | Freq: Once | INTRAVENOUS | Status: AC | PRN
  Administered 2022-12-23: 31.4 via INTRAVENOUS

## 2022-12-23 MED ORDER — SODIUM CHLORIDE FLUSH 0.9 % IV SOLN
INTRAVENOUS | Status: AC
  Administered 2022-12-23: 10 mL via INTRAVENOUS
  Filled 2022-12-23: qty 10

## 2022-12-23 MED ORDER — TECHNETIUM TC 99M TETROFOSMIN IV KIT
10.5000 | PACK | Freq: Once | INTRAVENOUS | Status: AC | PRN
  Administered 2022-12-23: 10.5 via INTRAVENOUS

## 2022-12-25 ENCOUNTER — Encounter: Payer: Self-pay | Admitting: Radiology

## 2022-12-25 DIAGNOSIS — H2513 Age-related nuclear cataract, bilateral: Secondary | ICD-10-CM | POA: Diagnosis not present

## 2022-12-25 DIAGNOSIS — H401113 Primary open-angle glaucoma, right eye, severe stage: Secondary | ICD-10-CM | POA: Diagnosis not present

## 2022-12-25 DIAGNOSIS — H5202 Hypermetropia, left eye: Secondary | ICD-10-CM | POA: Diagnosis not present

## 2022-12-25 DIAGNOSIS — H5211 Myopia, right eye: Secondary | ICD-10-CM | POA: Diagnosis not present

## 2022-12-25 DIAGNOSIS — H401121 Primary open-angle glaucoma, left eye, mild stage: Secondary | ICD-10-CM | POA: Diagnosis not present

## 2022-12-25 DIAGNOSIS — H52223 Regular astigmatism, bilateral: Secondary | ICD-10-CM | POA: Diagnosis not present

## 2023-01-07 ENCOUNTER — Telehealth: Payer: Self-pay | Admitting: Orthopaedic Surgery

## 2023-01-07 MED ORDER — HYDROCODONE-ACETAMINOPHEN 7.5-325 MG PO TABS: ORAL_TABLET | ORAL | 0 refills | Status: DC

## 2023-01-07 NOTE — Telephone Encounter (Signed)
Patient called, requesting a refill on Hydrocodone 7.5-325 to be sent to [facility].

## 2023-01-14 IMAGING — DX DG CHEST 2V
2 series · 2 of 2 positions shown · non-contrast
Comparison: Chest x-ray 11/07/2018.

CLINICAL DATA: 69-year-old male with history of right lower rib
pain for the past 8 months. Smoker.

EXAM:
CHEST - 2 VIEW

[chest pa]
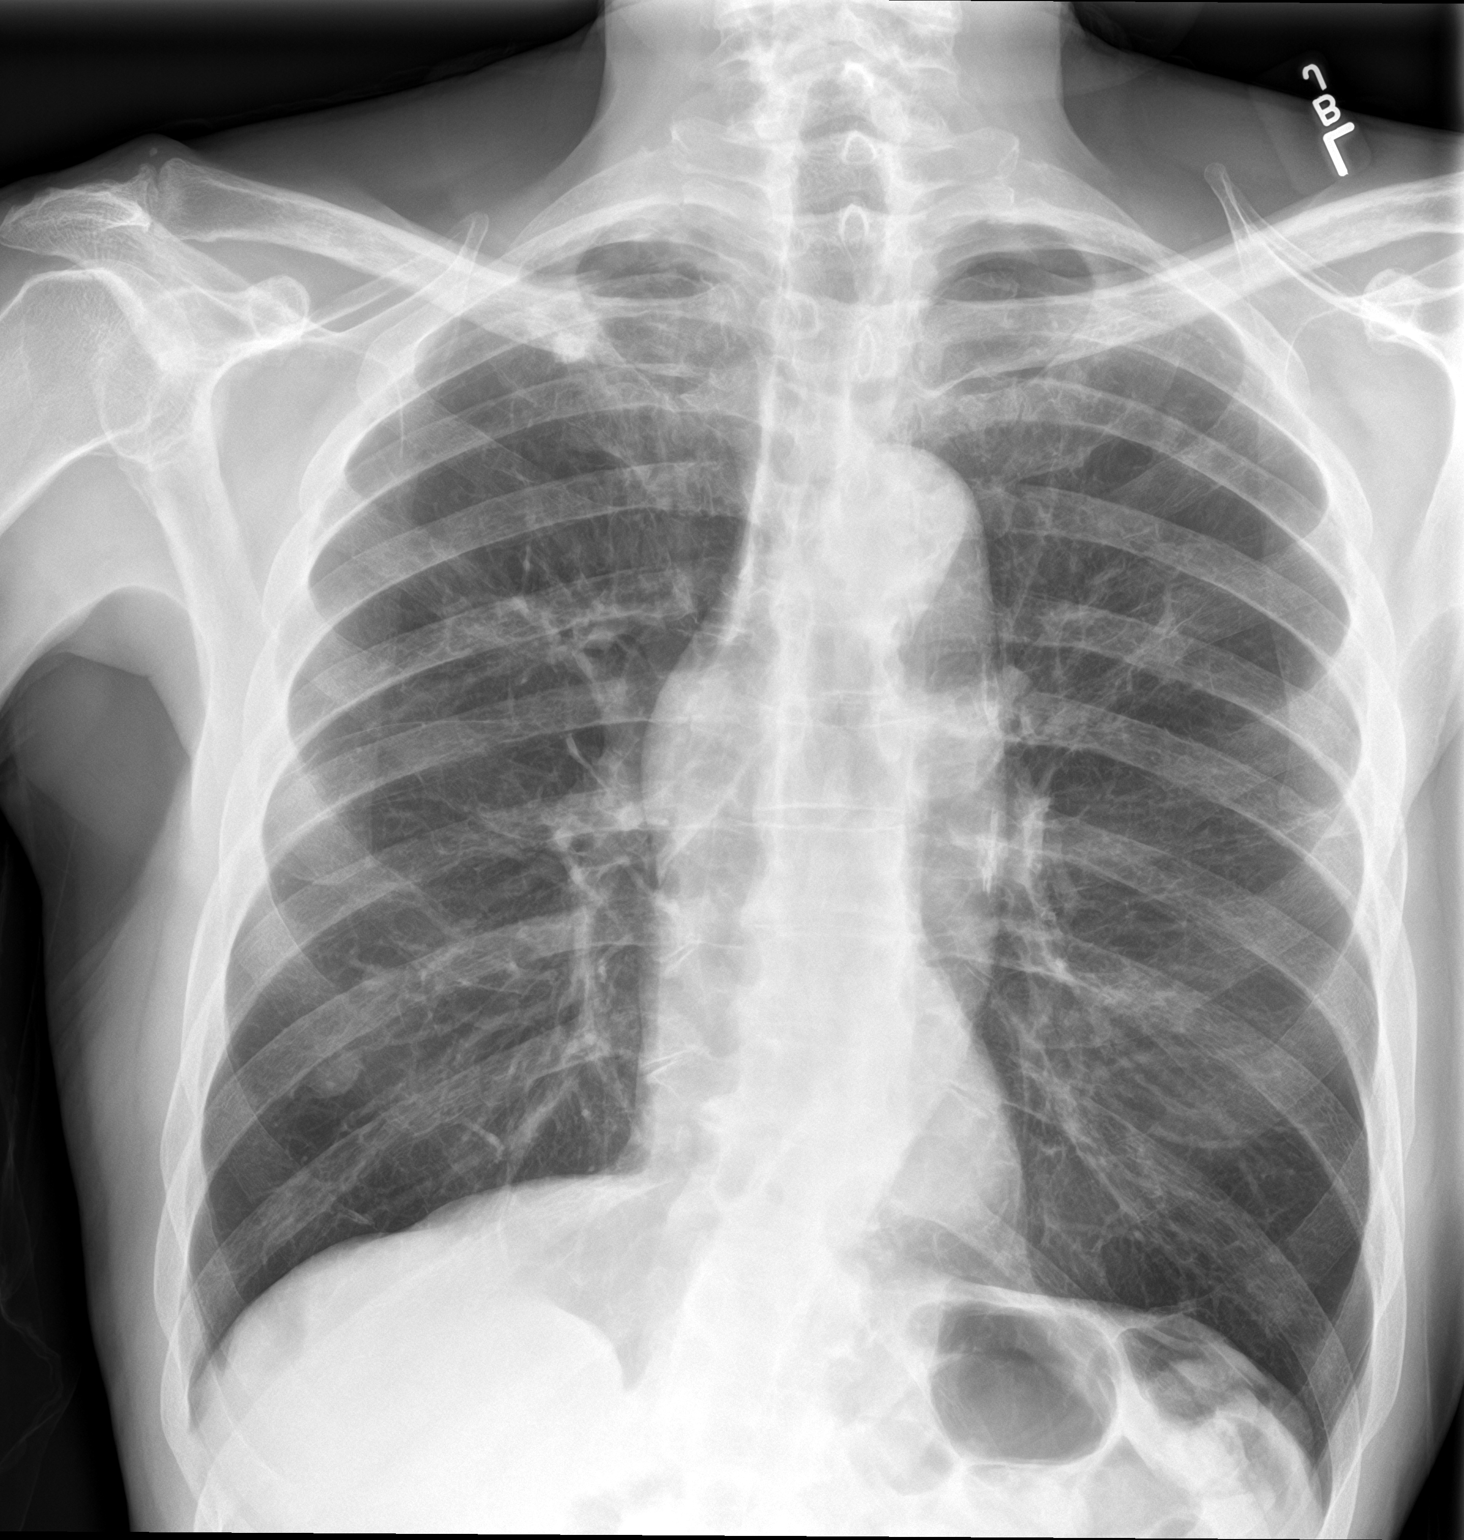

[chest lat]
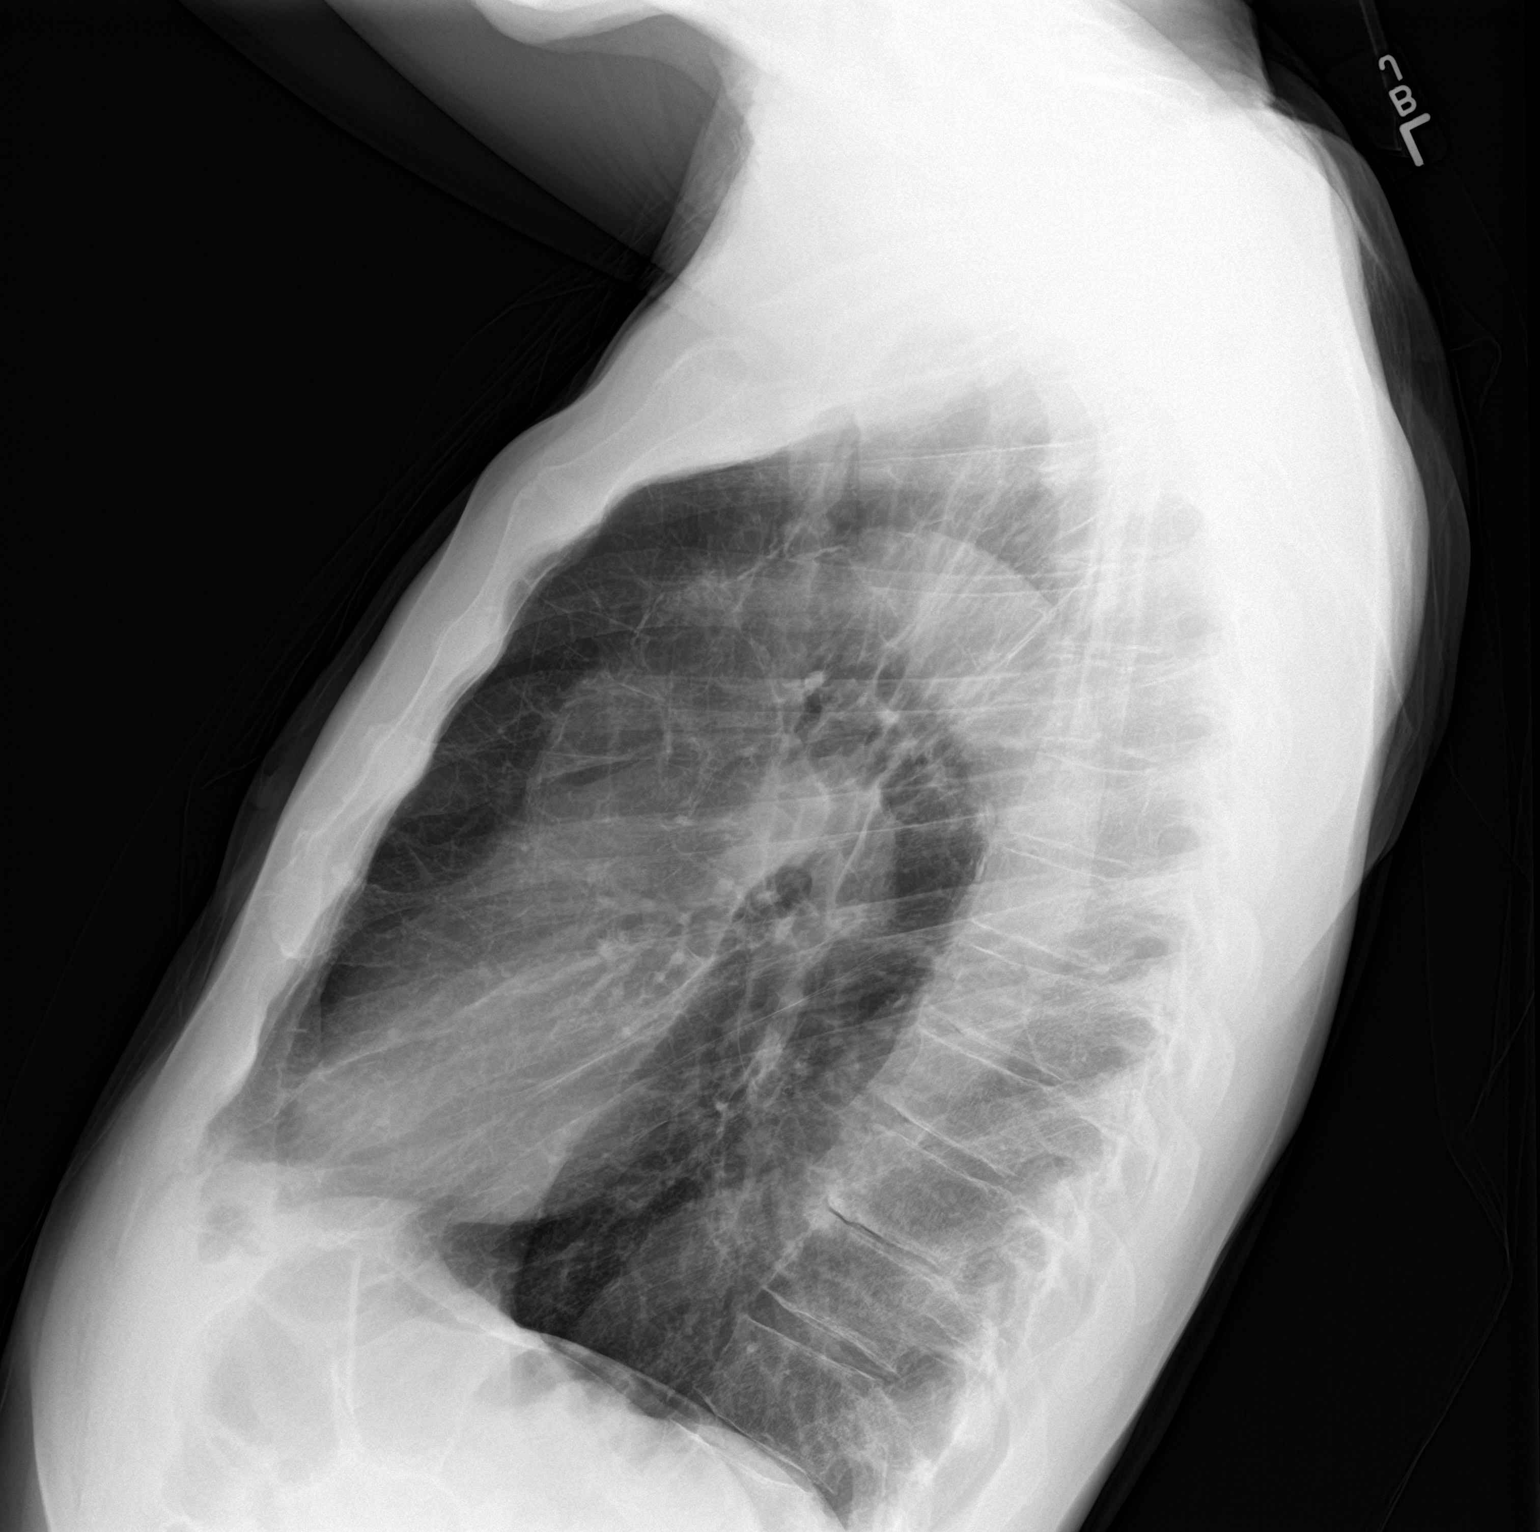

[2 of 2 positions shown; findings below may reference images not displayed]

FINDINGS: Lung volumes are increased with emphysematous changes. No
consolidative airspace disease. No pleural effusions. No
pneumothorax. Focal area of interstitial prominence in the
suprahilar region of the right lung, similar to the prior
examination, corresponding to areas of apparent post infectious or
inflammatory scarring on prior chest CT. Well-circumscribed
pulmonary nodule projecting over the right lung base measuring 1.6 x
0.9 cm, corresponding to partially calcified right lower lobe
pulmonary nodule which has been stable on numerous prior chest CTs
dating back to 3798, presumably a benign lesions such as a
hamartoma. Calcified granuloma in the apex of the right upper lobe.
No other suspicious appearing pulmonary nodule or mass noted.
Pulmonary vasculature and the cardiomediastinal silhouette are
within normal limits. Atherosclerosis in the thoracic aorta.
IMPRESSION: 1. No radiographic evidence of acute cardiopulmonary disease.
2. Emphysema.
3. Aortic atherosclerosis.

## 2023-02-03 ENCOUNTER — Ambulatory Visit (INDEPENDENT_AMBULATORY_CARE_PROVIDER_SITE_OTHER): Payer: Medicare HMO | Admitting: Family Medicine

## 2023-02-03 ENCOUNTER — Encounter: Payer: Self-pay | Admitting: Family Medicine

## 2023-02-03 VITALS — BP 144/82 | HR 85 | Ht 74.0 in | Wt 156.0 lb

## 2023-02-03 DIAGNOSIS — Z125 Encounter for screening for malignant neoplasm of prostate: Secondary | ICD-10-CM | POA: Diagnosis not present

## 2023-02-03 DIAGNOSIS — M5442 Lumbago with sciatica, left side: Secondary | ICD-10-CM

## 2023-02-03 DIAGNOSIS — F17200 Nicotine dependence, unspecified, uncomplicated: Secondary | ICD-10-CM

## 2023-02-03 DIAGNOSIS — R350 Frequency of micturition: Secondary | ICD-10-CM

## 2023-02-03 DIAGNOSIS — Z2882 Immunization not carried out because of caregiver refusal: Secondary | ICD-10-CM

## 2023-02-03 DIAGNOSIS — E785 Hyperlipidemia, unspecified: Secondary | ICD-10-CM | POA: Diagnosis not present

## 2023-02-03 DIAGNOSIS — I739 Peripheral vascular disease, unspecified: Secondary | ICD-10-CM

## 2023-02-03 DIAGNOSIS — Z720 Tobacco use: Secondary | ICD-10-CM | POA: Diagnosis not present

## 2023-02-03 DIAGNOSIS — I10 Essential (primary) hypertension: Secondary | ICD-10-CM | POA: Diagnosis not present

## 2023-02-03 DIAGNOSIS — G8929 Other chronic pain: Secondary | ICD-10-CM

## 2023-02-03 DIAGNOSIS — R972 Elevated prostate specific antigen [PSA]: Secondary | ICD-10-CM

## 2023-02-03 DIAGNOSIS — E559 Vitamin D deficiency, unspecified: Secondary | ICD-10-CM | POA: Diagnosis not present

## 2023-02-03 DIAGNOSIS — R300 Dysuria: Secondary | ICD-10-CM

## 2023-02-03 DIAGNOSIS — D539 Nutritional anemia, unspecified: Secondary | ICD-10-CM | POA: Diagnosis not present

## 2023-02-03 NOTE — Assessment & Plan Note (Signed)
Re eval by urology over due, labs today and needs to keep appt

## 2023-02-03 NOTE — Progress Notes (Signed)
Dillon [name]     MRN: [id number]      DOB: July 25, 1951   HPI Dillon [name] is here for follow up and re-evaluation of chronic medical conditions, medication management and review of any available recent lab and radiology data.  Preventive health is updated, specifically  Cancer screening and Immunization.   Questions or concerns regarding consultations or procedures which the PT has had in the interim are  addressed. The PT denies any adverse reactions to current medications since the last visit.  2 month h/o tightness in calves  with activity , which is relieved with rest No chest pain 1 pack of cigarettes lasts 3 weeks  ROS Denies recent fever or chills. Denies sinus pressure, nasal congestion, ear pain or sore throat. Denies chest congestion, productive cough or wheezing. Denies chest pains, palpitations and leg swelling Denies abdominal pain, nausea, vomiting,diarrhea or constipation.   Denies dysuria, c/o frequency and poor stream. Chronic  joint pain,  and limitation in mobility.Managed by Ortho Denies headaches, seizures, numbness, or tingling. Denies depression, anxiety or insomnia. Denies skin break down or rash.   PE  BP (!) 144/82   Pulse 85   Ht 6\' 2"  (1.88 m)   Wt 156 lb 0.6 oz (70.8 kg)   SpO2 95%   BMI 20.03 kg/m   Patient alert and oriented and in no cardiopulmonary distress.  HEENT: No facial asymmetry, EOMI,     Neck supple .  Chest: Clear to auscultation bilaterally.Decreased air entry  CVS: S1, S2 no murmurs, no S3.Regular rate.  ABD: Soft non tender.   Ext: No edema  MS: Decreased  ROM spine, adequate in shoulders, hips and knees.  Skin: Intact, no ulcerations or rash noted.  Psych: Good eye contact, normal affect. Memory intact not anxious or depressed appearing.  CNS: CN 2-12 intact, power,  normal throughout.no focal deficits noted.   Assessment & Plan  PAD (peripheral artery disease) Worsening lower ext symptoms x 2 months, re eval,  counseled to stop smoking  Elevated PSA Re eval by urology over due, labs today and needs to keep appt  Essential hypertension Elevated at visit and uncertain as to medication he is taking, return in 3 to 4 weeks with meds DASH diet and commitment to daily physical activity for a minimum of 30 minutes discussed and encouraged, as a part of hypertension management. The importance of attaining a healthy weight is also discussed.     02/03/2023    8:26 AM 02/03/2023    8:07 AM 02/03/2023    8:06 AM 11/27/2022    8:17 AM 10/09/2022    2:58 PM 08/27/2022    8:12 AM 07/31/2022    8:06 AM  BP/Weight  Systolic BP 144 159 154 148 136 146 128  Diastolic BP 82 88 80 81 72 83 83  Wt. (Lbs)   156.04  154 162 154  BMI   20.03 kg/m2  19.51 kg/m2 20.52 kg/m2 19.77 kg/m2       Low back pain with left-sided sciatica Managed by Ortho in chronic opioids  NICOTINE ADDICTION Asked:confirms currently smokes cigarettes Assess: Unwilling to set a quit date, but is cutting back Advise: needs to QUIT to reduce risk of cancer, cardio and cerebrovascular disease Assist: counseled for 5 minutes and literature provided Arrange: follow up in 2 to 4 months   Hyperlipidemia LDL goal <70 Hyperlipidemia:Low fat diet discussed and encouraged.   Lipid Panel  Lab Results  Component Value Date   CHOL  215 (H) 02/03/2023   HDL 87 02/03/2023   LDLCALC 118 (H) 02/03/2023   TRIG 56 02/03/2023   CHOLHDL 2.5 02/03/2023     Uncontrolled , needs to lower fat intake and comply with meds, needs to bring meds to next visit  Vitamin D deficiency Updated lab needed   Vaccine refused by parent Age appropriate vaccines again offered and  pt refuses  Urinary frequency CCIA with reflex c/s per Urology recommendtion

## 2023-02-03 NOTE — Assessment & Plan Note (Signed)
Worsening lower ext symptoms x 2 months, re eval, counseled to stop smoking

## 2023-02-03 NOTE — Patient Instructions (Addendum)
Annual exam in office with MD in 4 to 3 weeks, reevaluate blood pressure, call if you need me sooner.  Blood pressure is still high, medication will be adjusted if persists  Very important that you bring all medication to your next visit.  Labs today CBC lipid CMP and EGFR TSH PSA free and total vitamin D.  Clean-catch UA routine with reflex microscopic testing.  You are referred to urology it is important that you keep this  appointment this is already overdue.  Please commit to stopping smoking today. Please reconsider vaccines You are referred for evaluation of circulation in your legs we do know that you have problems with you circulation and you are having symptoms which suggest that it may be worsening.  Thanks for choosing [facility], we consider it a privelige to serve you.

## 2023-02-04 ENCOUNTER — Other Ambulatory Visit: Payer: Self-pay

## 2023-02-04 DIAGNOSIS — I739 Peripheral vascular disease, unspecified: Secondary | ICD-10-CM

## 2023-02-04 LAB — MICROSCOPIC EXAMINATION

## 2023-02-05 ENCOUNTER — Telehealth: Payer: Self-pay

## 2023-02-05 LAB — URINALYSIS, ROUTINE W REFLEX MICROSCOPIC
Glucose, UA: NEGATIVE
Nitrite, UA: NEGATIVE
RBC, UA: NEGATIVE
Specific Gravity, UA: 1.028 (ref 1.005–1.030)
Urobilinogen, Ur: 1 mg/dL (ref 0.2–1.0)
pH, UA: 6 (ref 5.0–7.5)

## 2023-02-05 LAB — CMP14+EGFR
ALT: 24 IU/L (ref 0–44)
AST: 35 IU/L (ref 0–40)
Albumin/Globulin Ratio: 1.7 (ref 1.2–2.2)
Albumin: 4.8 g/dL (ref 3.9–4.9)
Alkaline Phosphatase: 105 IU/L (ref 44–121)
BUN/Creatinine Ratio: 12 (ref 10–24)
BUN: 13 mg/dL (ref 8–27)
Bilirubin Total: 0.3 mg/dL (ref 0.0–1.2)
CO2: 21 mmol/L (ref 20–29)
Calcium: 9.8 mg/dL (ref 8.6–10.2)
Chloride: 98 mmol/L (ref 96–106)
Creatinine, Ser: 1.05 mg/dL (ref 0.76–1.27)
Globulin, Total: 2.8 g/dL (ref 1.5–4.5)
Glucose: 94 mg/dL (ref 70–99)
Potassium: 4.6 mmol/L (ref 3.5–5.2)
Sodium: 141 mmol/L (ref 134–144)
Total Protein: 7.6 g/dL (ref 6.0–8.5)
eGFR: 76 mL/min/{1.73_m2} (ref >59)

## 2023-02-05 LAB — PSA, TOTAL AND FREE
PSA, Free Pct: 26.3 %
PSA, Free: 3.39 ng/mL
Prostate Specific Ag, Serum: 12.9 ng/mL — ABNORMAL HIGH (ref 0.0–4.0)

## 2023-02-05 LAB — CBC
Hematocrit: 34.5 % — ABNORMAL LOW (ref 37.5–51.0)
Hemoglobin: 11.2 g/dL — ABNORMAL LOW (ref 13.0–17.7)
MCH: 26.9 pg (ref 26.6–33.0)
MCHC: 32.5 g/dL (ref 31.5–35.7)
MCV: 83 fL (ref 79–97)
Platelets: 439 10*3/uL (ref 150–450)
RBC: 4.16 x10E6/uL (ref 4.14–5.80)
RDW: 14.7 % (ref 11.6–15.4)
WBC: 4.3 10*3/uL (ref 3.4–10.8)

## 2023-02-05 LAB — MICROSCOPIC EXAMINATION
Bacteria, UA: NONE SEEN
Casts: NONE SEEN /lpf

## 2023-02-05 LAB — LIPID PANEL
Chol/HDL Ratio: 2.5 ratio (ref 0.0–5.0)
Cholesterol, Total: 215 mg/dL — ABNORMAL HIGH (ref 100–199)
HDL: 87 mg/dL (ref >39)
LDL Chol Calc (NIH): 118 mg/dL — ABNORMAL HIGH (ref 0–99)
Triglycerides: 56 mg/dL (ref 0–149)
VLDL Cholesterol Cal: 10 mg/dL (ref 5–40)

## 2023-02-05 LAB — TSH: TSH: 1.93 u[IU]/mL (ref 0.450–4.500)

## 2023-02-05 LAB — VITAMIN D 25 HYDROXY (VIT D DEFICIENCY, FRACTURES): Vit D, 25-Hydroxy: 16.6 ng/mL — ABNORMAL LOW (ref 30.0–100.0)

## 2023-02-05 MED ORDER — HYDROCODONE-ACETAMINOPHEN 7.5-325 MG PO TABS: ORAL_TABLET | ORAL | 0 refills | Status: DC

## 2023-02-05 NOTE — Telephone Encounter (Signed)
Hydrocodone-Acetaminophen 7.5/325 MG Qty 110 Tablets  PATIENT USES [facility] 

## 2023-02-08 ENCOUNTER — Encounter: Payer: Self-pay | Admitting: Family Medicine

## 2023-02-08 DIAGNOSIS — R350 Frequency of micturition: Secondary | ICD-10-CM | POA: Insufficient documentation

## 2023-02-08 DIAGNOSIS — N401 Enlarged prostate with lower urinary tract symptoms: Secondary | ICD-10-CM | POA: Insufficient documentation

## 2023-02-08 NOTE — Assessment & Plan Note (Signed)
Elevated at visit and uncertain as to medication he is taking, return in 3 to 4 weeks with meds DASH diet and commitment to daily physical activity for a minimum of 30 minutes discussed and encouraged, as a part of hypertension management. The importance of attaining a healthy weight is also discussed.     02/03/2023    8:26 AM 02/03/2023    8:07 AM 02/03/2023    8:06 AM 11/27/2022    8:17 AM 10/09/2022    2:58 PM 08/27/2022    8:12 AM 07/31/2022    8:06 AM  BP/Weight  Systolic BP 144 159 154 148 136 146 128  Diastolic BP 82 88 80 81 72 83 83  Wt. (Lbs)   156.04  154 162 154  BMI   20.03 kg/m2  19.51 kg/m2 20.52 kg/m2 19.77 kg/m2

## 2023-02-08 NOTE — Assessment & Plan Note (Signed)
Asked:confirms currently smokes cigarettes °Assess: Unwilling to set a quit date, but is cutting back °Advise: needs to QUIT to reduce risk of cancer, cardio and cerebrovascular disease °Assist: counseled for 5 minutes and literature provided °Arrange: follow up in 2 to 4 months ° °

## 2023-02-08 NOTE — Assessment & Plan Note (Signed)
Updated lab needed.  

## 2023-02-08 NOTE — Assessment & Plan Note (Signed)
Age appropriate vaccines again offered and  pt refuses

## 2023-02-08 NOTE — Assessment & Plan Note (Signed)
Hyperlipidemia:Low fat diet discussed and encouraged.   Lipid Panel  Lab Results  Component Value Date   CHOL 215 (H) 02/03/2023   HDL 87 02/03/2023   LDLCALC 118 (H) 02/03/2023   TRIG 56 02/03/2023   CHOLHDL 2.5 02/03/2023     Uncontrolled , needs to lower fat intake and comply with meds, needs to bring meds to next visit

## 2023-02-08 NOTE — Assessment & Plan Note (Signed)
CCIA with reflex c/s per Urology recommendtion

## 2023-02-08 NOTE — Assessment & Plan Note (Signed)
Managed by Ortho in chronic opioids

## 2023-02-09 ENCOUNTER — Other Ambulatory Visit: Payer: Self-pay | Admitting: Family Medicine

## 2023-02-09 DIAGNOSIS — D539 Nutritional anemia, unspecified: Secondary | ICD-10-CM

## 2023-02-10 LAB — IRON: Iron: 22 ug/dL — ABNORMAL LOW (ref 38–169)

## 2023-02-10 LAB — FERRITIN: Ferritin: 42 ng/mL (ref 30–400)

## 2023-02-10 LAB — SPECIMEN STATUS REPORT

## 2023-02-25 ENCOUNTER — Ambulatory Visit (INDEPENDENT_AMBULATORY_CARE_PROVIDER_SITE_OTHER): Payer: Medicare HMO | Admitting: Vascular Surgery

## 2023-02-25 ENCOUNTER — Ambulatory Visit (INDEPENDENT_AMBULATORY_CARE_PROVIDER_SITE_OTHER): Payer: Medicare HMO

## 2023-02-25 ENCOUNTER — Ambulatory Visit: Payer: Medicare HMO | Admitting: Orthopaedic Surgery

## 2023-02-25 ENCOUNTER — Encounter: Payer: Self-pay | Admitting: Vascular Surgery

## 2023-02-25 VITALS — BP 147/78 | HR 78 | Temp 97.5°F | Ht 74.0 in | Wt 155.6 lb

## 2023-02-25 DIAGNOSIS — I70211 Atherosclerosis of native arteries of extremities with intermittent claudication, right leg: Secondary | ICD-10-CM

## 2023-02-25 DIAGNOSIS — I739 Peripheral vascular disease, unspecified: Secondary | ICD-10-CM | POA: Diagnosis not present

## 2023-02-25 DIAGNOSIS — I714 Abdominal aortic aneurysm, without rupture, unspecified: Secondary | ICD-10-CM | POA: Diagnosis not present

## 2023-02-25 LAB — VAS US ABI WITH/WO TBI
Left ABI: 0.55
Right ABI: 0.58

## 2023-02-25 MED ORDER — CILOSTAZOL 100 MG PO TABS: 100.0000 mg | ORAL_TABLET | Freq: Two times a day (BID) | ORAL | 11 refills | Status: DC

## 2023-02-25 NOTE — Progress Notes (Signed)
[facility]  Patient name: Dillon [name] MRN: [id number] DOB: 1951-10-12 Sex: male  REASON FOR VISIT: Follow-up intermittent claudication  HPI: Dillon [name] is a 72 y.o. male here today for follow-up.  He has a history of intermittent claudication.  Also had incidental finding of infrarenal abdominal aortic aneurysm in the past.  He reports that he has had progression of left leg claudication and now feels that his left calf claudication is worse than his right.  Up until this point the right was more limiting to him.  He denies any rest pain and no tissue loss.  He did undergo formal arteriogram in 2019 revealing SFA and popliteal disease.  He has been treated nonoperatively.  Past Medical History:  Diagnosis Date   Arthritis    2012   Asthma    childhood   COPD (chronic obstructive pulmonary disease) (HCC)    Glaucoma    Hyperlipidemia    1990   Hypertension    1990   Nicotine addiction    PAD (peripheral artery disease) (HCC) 2015    Family History  Problem Relation Age of Onset   Heart attack Mother    Diabetes Mother    Hypertension Mother    Stroke Brother    Hypertension Sister    Hypertension Sister    Hypertension Sister    Hypertension Brother    Colon cancer Neg Hx     SOCIAL HISTORY: Social History   Tobacco Use   Smoking status: Every Day    Packs/day: 0.25    Years: 48.00    Additional pack years: 0.00    Total pack years: 12.00    Types: Cigarettes    Start date: 10/16/1969    Passive exposure: Never   Smokeless tobacco: Never  Substance Use Topics   Alcohol use: Yes    Comment: occassional, max of 72 oz pwr month    Allergies  Allergen Reactions   Penicillins Hives and Other (See Comments)    Has patient had a PCN reaction causing immediate rash, facial/tongue/throat swelling, SOB or lightheadedness with hypotension: Unknown Has patient had a PCN reaction causing severe  rash involving mucus membranes or skin necrosis: No Has patient had a PCN reaction that required hospitalization: Yes Has patient had a PCN reaction occurring within the last 10 years: No If all of the above answers are "NO", then may proceed with Cephalosporin use.     Current Outpatient Medications  Medication Sig Dispense Refill   ALPHAGAN P 0.1 % SOLN Place 1 drop into both eyes in the morning and at bedtime.     amLODipine (NORVASC) 10 MG tablet TAKE 1 TABLET BY MOUTH EVERY DAY 90 tablet 1   aspirin EC 81 MG tablet Take 81 mg by mouth daily.     atorvastatin (LIPITOR) 80 MG tablet TAKE 1 TABLET BY MOUTH EVERY DAY 90 tablet 3   BREO ELLIPTA 200-25 MCG/ACT AEPB INHALE 1 PUFF BY MOUTH EVERY DAY 60 each 5   cyclobenzaprine (FLEXERIL) 10 MG tablet TAKE 1 TABLET (10 MG TOTAL) BY MOUTH AT BEDTIME AS NEEDED FOR SPASM. 30 tablet 3   dorzolamide-timolol (COSOPT) 22.3-6.8 MG/ML ophthalmic solution 1 drop 2 (two) times daily.     ergocalciferol (VITAMIN D2) 1.25 MG (50000 UT) capsule Take 1 capsule (50,000 Units total) by mouth once a week. One capsule once weekly 12 capsule 2   gabapentin (NEURONTIN) 300 MG capsule TAKE 1 CAPSULE (300 MG TOTAL)  BY MOUTH AT BEDTIME. 30 capsule 2   HYDROcodone-acetaminophen (NORCO) 7.5-325 MG tablet One tablet every six hours as need for pain.  Must last 28 days. 110 tablet 0   ipratropium (ATROVENT) 0.02 % nebulizer solution Take 2.5 mLs (0.5 mg total) by nebulization every 6 (six) hours as needed for wheezing or shortness of breath. 75 mL 12   latanoprost (XALATAN) 0.005 % ophthalmic solution 1 drop daily.     levalbuterol (XOPENEX) 0.63 MG/3ML nebulizer solution Take 3 mLs (0.63 mg total) by nebulization every 8 (eight) hours as needed for wheezing or shortness of breath. 3 mL 12   RHOPRESSA 0.02 % SOLN Place 1 drop into the right eye at bedtime.     spironolactone (ALDACTONE) 50 MG tablet TAKE 1 TABLET BY MOUTH EVERY DAY 90 tablet 1   UNABLE TO FIND Nebulizer  facemask and tubing x 1  DX J44.9 1 each 0   No current facility-administered medications for this visit.    REVIEW OF SYSTEMS:  [X]  denotes positive finding, [ ]  denotes negative finding Cardiac  Comments:  Chest pain or chest pressure:    Shortness of breath upon exertion:    Short of breath when lying flat:    Irregular heart rhythm:        Vascular    Pain in calf, thigh, or hip brought on by ambulation: x   Pain in feet at night that wakes you up from your sleep:     Blood clot in your veins:    Leg swelling:           PHYSICAL EXAM: Vitals:   02/25/23 1251  BP: (!) 147/78  Pulse: 78  Temp: (!) 97.5 F (36.4 C)  SpO2: 97%  Weight: 155 lb 9.6 oz (70.6 kg)  Height: 6\' 2"  (1.88 m)    GENERAL: The patient is a well-nourished male, in no acute distress. The vital signs are documented above. CARDIOVASCULAR: 2+ radial and 2+ femoral pulses bilaterally.  Absent popliteal and distal pulses bilaterally.   PULMONARY: There is good air exchange  MUSCULOSKELETAL: There are no major deformities or cyanosis. NEUROLOGIC: No focal weakness or paresthesias are detected. SKIN: There are no ulcers or rashes noted. PSYCHIATRIC: The patient has a normal affect.  DATA:  Ankle arm index today is 0.67 on the right and 0.73 on the left  MEDICAL ISSUES: Had long discussion with the patient regarding his claudication symptoms.  He clearly has SFA disease bilaterally.  Does not appear that he has been given a trial of cilostazol in the past.  I have recommended this and will send this to his pharmacy 100 mg twice daily.  Explained that this will take several months to notice any subjective improvement.  We will see him back in 3 months for continued discussion.  I did discuss potential for endovascular treatment and femoral-popliteal bypass but explained that this would require staged bilateral treatments and would certainly recommend tolerating his claudication as long as  possible.  Regarding his infrarenal abdominal aortic aneurysm, his most recent ultrasound was 1 year ago showing a maximal diameter of 3.6 cm.  I would recommend that we repeat this in 1 year and we will coordinate this.     Dillon Earthly, MD FACS [facility] [phone]  Note: Portions of this report may have been transcribed using voice recognition software.  Every effort has been made to ensure accuracy; however, inadvertent computerized transcription errors may  still be present.

## 2023-02-27 ENCOUNTER — Ambulatory Visit: Payer: Medicare HMO | Admitting: Family Medicine

## 2023-03-03 ENCOUNTER — Encounter: Payer: Self-pay | Admitting: Orthopaedic Surgery

## 2023-03-03 ENCOUNTER — Ambulatory Visit (INDEPENDENT_AMBULATORY_CARE_PROVIDER_SITE_OTHER): Payer: Medicare HMO | Admitting: Orthopaedic Surgery

## 2023-03-03 DIAGNOSIS — M5442 Lumbago with sciatica, left side: Secondary | ICD-10-CM | POA: Diagnosis not present

## 2023-03-03 DIAGNOSIS — G8929 Other chronic pain: Secondary | ICD-10-CM

## 2023-03-03 DIAGNOSIS — F17210 Nicotine dependence, cigarettes, uncomplicated: Secondary | ICD-10-CM | POA: Diagnosis not present

## 2023-03-03 NOTE — Progress Notes (Signed)
I have more back pain.  He has had more pain in the lower back over the last few weeks.  He has no numbness.  He has no weakness.  He does not want to have any surgery.  He says he will put up with it.  Spine/Pelvis examination:  Inspection:  Overall, sacoiliac joint benign and hips nontender; without crepitus or defects.   Thoracic spine inspection: Alignment normal without kyphosis present   Lumbar spine inspection:  Alignment  with normal lumbar lordosis, without scoliosis apparent.   Thoracic spine palpation:  without tenderness of spinal processes   Lumbar spine palpation: without tenderness of lumbar area; without tightness of lumbar muscles    Range of Motion:   Lumbar flexion, forward flexion is normal without pain or tenderness    Lumbar extension is full without pain or tenderness   Left lateral bend is normal without pain or tenderness   Right lateral bend is normal without pain or tenderness   Straight leg raising is normal  Strength & tone: normal   Stability overall normal stability  , Encounter Diagnoses  Name Primary?   Chronic midline low back pain with left-sided sciatica Yes   Cigarette nicotine dependence without complication    Return in three months.  Call if any problem.  Precautions discussed.  Electronically Signed Darreld Mclean, MD 5/7/20248:21 AM

## 2023-03-04 ENCOUNTER — Ambulatory Visit: Payer: Medicare HMO | Admitting: Urology

## 2023-03-04 DIAGNOSIS — R972 Elevated prostate specific antigen [PSA]: Secondary | ICD-10-CM

## 2023-03-05 ENCOUNTER — Ambulatory Visit: Payer: Medicare HMO | Admitting: Family Medicine

## 2023-03-09 ENCOUNTER — Telehealth: Payer: Self-pay

## 2023-03-09 NOTE — Telephone Encounter (Signed)
Hydrocodone-Acetaminophen 7.5/325 MG    Qty 110 Tablets  PATIENT USES [facility]

## 2023-03-10 MED ORDER — HYDROCODONE-ACETAMINOPHEN 7.5-325 MG PO TABS: ORAL_TABLET | ORAL | 0 refills | Status: DC

## 2023-04-06 ENCOUNTER — Telehealth: Payer: Self-pay

## 2023-04-06 NOTE — Telephone Encounter (Signed)
Dr. Hilda Lias pt----Hydrocodone-Acetaminophen 7.5/325 MG Qty 110 Tablets  PATIENT USES [facility]

## 2023-04-07 MED ORDER — HYDROCODONE-ACETAMINOPHEN 7.5-325 MG PO TABS: ORAL_TABLET | ORAL | 0 refills | Status: DC

## 2023-04-07 NOTE — Addendum Note (Signed)
Addended by: Michaele Offer on: 04/07/2023 11:32 AM   Modules accepted: Orders

## 2023-04-07 NOTE — Addendum Note (Signed)
Addended by: Earnstine Regal on: 04/07/2023 01:26 PM   Modules accepted: Orders

## 2023-04-08 ENCOUNTER — Encounter: Payer: Self-pay | Admitting: Student

## 2023-04-08 ENCOUNTER — Ambulatory Visit: Payer: Medicare HMO | Attending: Student | Admitting: Student

## 2023-04-08 VITALS — BP 136/80 | HR 88 | Ht 73.5 in | Wt 155.6 lb

## 2023-04-08 DIAGNOSIS — I10 Essential (primary) hypertension: Secondary | ICD-10-CM | POA: Diagnosis not present

## 2023-04-08 DIAGNOSIS — I6523 Occlusion and stenosis of bilateral carotid arteries: Secondary | ICD-10-CM

## 2023-04-08 DIAGNOSIS — I739 Peripheral vascular disease, unspecified: Secondary | ICD-10-CM

## 2023-04-08 DIAGNOSIS — E785 Hyperlipidemia, unspecified: Secondary | ICD-10-CM | POA: Diagnosis not present

## 2023-04-08 DIAGNOSIS — R0789 Other chest pain: Secondary | ICD-10-CM

## 2023-04-08 NOTE — Patient Instructions (Signed)
Medication Instructions:   Continue current medication regimen.   *If you need a refill on your cardiac medications before your next appointment, please call your pharmacy*   Lab Work:  Fasting Lipid Panel in 6-8 weeks.   If you have labs (blood work) drawn today and your tests are completely normal, you will receive your results only by: MyChart Message (if you have MyChart) OR A paper copy in the mail If you have any lab test that is abnormal or we need to change your treatment, we will call you to review the results.   Testing/Procedures:  Caroid Dopplers prior to next visit.    Follow-Up: At [facility], you and your health needs are our priority.  As part of our continuing mission to provide you with exceptional heart care, we have created designated Provider Care Teams.  These Care Teams include your primary Cardiologist (physician) and Advanced Practice Providers (APPs -  Physician Assistants and Nurse Practitioners) who all work together to provide you with the care you need, when you need it.  We recommend signing up for the patient portal called "MyChart".  Sign up information is provided on this After Visit Summary.  MyChart is used to connect with patients for Virtual Visits (Telemedicine).  Patients are able to view lab/test results, encounter notes, upcoming appointments, etc.  Non-urgent messages can be sent to your provider as well.   To learn more about what you can do with MyChart, go to [email or web address].    Your next appointment:   6 month(s)  Provider:   You may see Dina Rich, MD or one of the following Advanced Practice Providers on your designated Care Team:   [place], PA-C  Jacolyn Reedy, [place]

## 2023-04-08 NOTE — Progress Notes (Signed)
Cardiology Office Note    Date:  04/08/2023  ID:  ZAILYN REPPUCCI, DOB 03-06-1951, MRN [id number] Cardiologist: Dina Rich, MD    History of Present Illness:    Dillon [name] is a 72 y.o. male with past medical history of coronary calcification noted on CT (low-risk NST in 11/2022), HTN, HLD, PAD, COPD, infrarenal abdominal aortic aneurysm and carotid artery stenosis who presents to the office today for 63-month follow-up.  He was examined by Dr. Wyline Mood in 09/2022 and reported more episodes of chest discomfort over the past few months which could occur at rest or with activity. Symptoms were overall felt to be atypical but given his risk factors, an echocardiogram along with [facility] were recommended for further evaluation. He was unable to travel to [facility] for a Coronary CTA. His echocardiogram showed a preserved EF of 60 to 65% with no regional wall motion abnormalities. RV function was poorly visualized but no significant valve abnormalities. A Lexiscan Myoview was recommend for further evaluation and this showed an area consistent with prior inferior septal infarct but no current ischemia and was overall a low-risk study.  In talking with the patient today, he reports overall doing well since his last office visit. He does have intermittent dyspnea on exertion but says this improves with the use of Breo. He remains very active at baseline and mows over 13 yards per week. Says he uses a riding mower but does use a weedeater for trim work. He denies any associated chest pain or palpitations with this. Does report occasional sternal discomfort in the evening hours and symptoms resolve with using a "pinch" of baking soda. He prefers this as to taking antacids. He denies any specific orthopnea, PND or pitting edema. Reports his leg pain significantly improved after being started on Pletal by Vascular Surgery.   Studies Reviewed:   EKG: EKG is not ordered today.  Echocardiogram:  11/2022 IMPRESSIONS     1. Left ventricular ejection fraction, by estimation, is 60 to 65%. The  left ventricle has normal function. The left ventricle has no regional  wall motion abnormalities. Left ventricular diastolic parameters were  normal.   2. Right ventricular systolic function was not well visualized. The right  ventricular size is normal. Tricuspid regurgitation signal is inadequate  for assessing PA pressure.   3. The mitral valve is abnormal. No evidence of mitral valve  regurgitation. No evidence of mitral stenosis.   4. The aortic valve is tricuspid. There is moderate calcification of the  aortic valve. Aortic valve regurgitation is not visualized. No aortic  stenosis is present.   5. The inferior vena cava is normal in size with greater than 50%  respiratory variability, suggesting right atrial pressure of 3 mmHg.   Comparison(s): No prior Echocardiogram.   NST: 11/2022   Findings are consistent with prior inferoseptal infarction. There is no current ischemia The study is low risk.   No ST deviation was noted.   LV perfusion is abnormal. Moderate size mild to moderate intensity fixed inferoseptal defect   Left ventricular function is normal. Nuclear stress EF: 61 %. The left ventricular ejection fraction is normal (55-65%). End diastolic cavity size is normal.    Physical Exam:   VS:  BP 136/80   Pulse 88   Ht 6' 1.5" (1.867 m)   Wt 155 lb 9.6 oz (70.6 kg)   SpO2 97%   BMI 20.25 kg/m    Wt Readings from Last 3 Encounters:  04/08/23  155 lb 9.6 oz (70.6 kg)  02/25/23 155 lb 9.6 oz (70.6 kg)  02/03/23 156 lb 0.6 oz (70.8 kg)     GEN: Well nourished, well developed male appearing in no acute distress NECK: No JVD; No carotid bruits CARDIAC: RRR, no murmurs, rubs, gallops RESPIRATORY:  Clear to auscultation without rales, wheezing or rhonchi  ABDOMEN: Appears non-distended. No obvious abdominal masses. EXTREMITIES: No clubbing or cyanosis. No pitting  edema.  Distal pedal pulses are 2+ bilaterally.   Assessment and Plan:   1. Coronary Calcification by CT/Atypical Chest Pain - Recent NST in 11/2022 was low-risk as discussed above with no evidence of current ischemia. Reviewed test results with the patient today and his recent episodes of chest discomfort overall seem atypical for a cardiac etiology as they occur after food consumption and resolve with using a "pinch" of baking soda. No exertional chest pain. Declines PPI. We reviewed warning signs to monitor for which would prompt further evaluation.  At this time, we will continue with risk factor modification. Continue ASA 81 mg daily and statin therapy.  2. PAD - He has bilateral SFA disease and was recently started on Pletal by Vascular in 02/2023.  Options for endovascular treatment with possible femoropopliteal bypass were reviewed but it was recommended to try medical therapy initially. It was also recommended at that time to plan for repeat imaging in 1 year for further surveillance of his infrarenal abdominal aortic aneurysm.  3. Carotid Artery Stenosis - Dopplers in 2019 showed 50 to 69% stenosis along the RICA and less than 50% stenosis along the LICA. Follow-up dopplers have not been obtained in the interim and we discussed the indication for this today. He is in agreement with obtaining repeat carotid dopplers. Remains on ASA 81 mg daily and Atorvastatin 80 mg daily.  4. HTN - BP is well-controlled at 136/80 during today's visit. Continue current medical therapy with Amlodipine 10 mg daily and Spironolactone 50 mg daily. Creatinine was stable at 1.05 when checked in 01/2023 with K+ at 4.6.  5. HLD - His LDL was elevated to 118 when checked in 01/2023. Says he was not taking his statin at that time and had been off of it for at least 3 to 4 months. He is now compliant with that and we will plan for a follow-up FLP in 2 months. Continue Atorvastatin 80 mg daily for now. If LDL remains  above goal despite compliance, would switch to Crestor 40 mg daily as previously recommended.   Signed, Ellsworth Lennox, PA-C

## 2023-04-13 ENCOUNTER — Telehealth: Payer: Self-pay

## 2023-04-13 NOTE — Telephone Encounter (Signed)
He received his medication

## 2023-04-21 ENCOUNTER — Ambulatory Visit (HOSPITAL_COMMUNITY)
Admission: RE | Admit: 2023-04-21 | Discharge: 2023-04-21 | Disposition: A | Discharge status: Home / Self Care | Payer: Medicare HMO | Source: Ambulatory Visit | Attending: Student | Admitting: Student

## 2023-04-21 ENCOUNTER — Ambulatory Visit (HOSPITAL_COMMUNITY): Payer: Medicare HMO

## 2023-04-21 DIAGNOSIS — I6523 Occlusion and stenosis of bilateral carotid arteries: Secondary | ICD-10-CM | POA: Insufficient documentation

## 2023-04-21 NOTE — Telephone Encounter (Signed)
completed

## 2023-04-22 ENCOUNTER — Telehealth: Payer: Self-pay

## 2023-04-22 NOTE — Telephone Encounter (Signed)
-----   Message from Ellsworth Lennox, [place] sent at 04/21/2023  7:15 PM EDT ----- Please let the patient know his carotid dopplers remain similar to prior imaging from 2019 with 50 to 69% stenosis along the right internal carotid artery and moderate plaque along the left internal carotid artery but not impacting blood flow. Continue aspirin and statin therapy.

## 2023-04-22 NOTE — Telephone Encounter (Signed)
Patient notified and verbalized understanding. Patient had no questions or concerns at this time.  

## 2023-04-23 DIAGNOSIS — H401122 Primary open-angle glaucoma, left eye, moderate stage: Secondary | ICD-10-CM | POA: Diagnosis not present

## 2023-04-23 DIAGNOSIS — H401113 Primary open-angle glaucoma, right eye, severe stage: Secondary | ICD-10-CM | POA: Diagnosis not present

## 2023-04-26 ENCOUNTER — Other Ambulatory Visit: Payer: Self-pay | Admitting: Family Medicine

## 2023-04-27 ENCOUNTER — Other Ambulatory Visit: Payer: Self-pay | Admitting: Family Medicine

## 2023-04-29 ENCOUNTER — Other Ambulatory Visit: Payer: Self-pay | Admitting: Internal Medicine

## 2023-04-29 ENCOUNTER — Telehealth: Payer: Self-pay | Admitting: Family Medicine

## 2023-04-29 NOTE — Telephone Encounter (Signed)
Prescription Request  04/29/2023  LOV: 02/03/2023  What is the name of the medication or equipment? BREO ELLIPTA 200-25 MCG/ACT AEPB   Have you contacted your pharmacy to request a refill? Yes   Which pharmacy would you like this sent to?  [id number] - [place], [place] - [address] [facility] [address] [place] [address] Phone: [phone] Fax: [phone]    Patient notified that their request is being sent to the clinical staff for review and that they should receive a response within 2 business days.   Please advise at Mobile [phone] (mobile)

## 2023-05-06 ENCOUNTER — Telehealth: Payer: Self-pay | Admitting: Orthopaedic Surgery

## 2023-05-06 NOTE — Telephone Encounter (Signed)
Dr. Sanjuan Dame pt - pt presented to the office requesting a refill on his Hydrocodone 7.5-325 to be sent to [facility].

## 2023-05-07 MED ORDER — HYDROCODONE-ACETAMINOPHEN 7.5-325 MG PO TABS: ORAL_TABLET | ORAL | 0 refills | Status: DC

## 2023-05-13 DIAGNOSIS — E785 Hyperlipidemia, unspecified: Secondary | ICD-10-CM | POA: Diagnosis not present

## 2023-05-14 ENCOUNTER — Telehealth: Payer: Self-pay

## 2023-05-14 DIAGNOSIS — E785 Hyperlipidemia, unspecified: Secondary | ICD-10-CM

## 2023-05-14 LAB — LIPID PANEL
Cholesterol: 213 mg/dL — ABNORMAL HIGH (ref <200)
HDL: 97 mg/dL (ref >=40)
LDL Cholesterol (Calc): 101 mg/dL (calc) — ABNORMAL HIGH
Non-HDL Cholesterol (Calc): 116 mg/dL (calc) (ref <130)
Total CHOL/HDL Ratio: 2.2 (calc) (ref <5.0)
Triglycerides: 66 mg/dL (ref <150)

## 2023-05-14 MED ORDER — ROSUVASTATIN CALCIUM 40 MG PO TABS: 40.0000 mg | ORAL_TABLET | Freq: Every day | ORAL | 3 refills | Status: DC

## 2023-05-14 NOTE — Telephone Encounter (Signed)
-----   Message from Ellsworth Lennox sent at 05/14/2023  7:28 AM EDT ----- Please let the patient know that his cholesterol remains elevated with total cholesterol at 213 and LDL at 101. We prefer for his LDL to be less than 70. If he has been compliant with Atorvastatin, would recommend stopping this and switching to Crestor 40 mg daily. Repeat FLP and LFT's in 2 months.

## 2023-05-14 NOTE — Telephone Encounter (Signed)
Patient notified of results and verbalized understanding. Patient had no questions or concerns at this time. 

## 2023-05-23 ENCOUNTER — Other Ambulatory Visit: Payer: Self-pay | Admitting: Family Medicine

## 2023-05-25 ENCOUNTER — Ambulatory Visit (INDEPENDENT_AMBULATORY_CARE_PROVIDER_SITE_OTHER): Payer: Medicare HMO

## 2023-05-25 VITALS — Ht 73.5 in | Wt 161.0 lb

## 2023-05-25 DIAGNOSIS — Z87891 Personal history of nicotine dependence: Secondary | ICD-10-CM

## 2023-05-25 DIAGNOSIS — Z0000 Encounter for general adult medical examination without abnormal findings: Secondary | ICD-10-CM

## 2023-05-25 NOTE — Patient Instructions (Addendum)
Dillon [name] , Thank you for taking time to come for your Medicare Wellness Visit. I appreciate your ongoing commitment to your health goals. Please review the following plan we discussed and let me know if I can assist you in the future.   These are the goals we discussed:  Goals      Quit Smoking     Would like to quit smoking. Has cut way back. GREAT JOB!        This is a list of the screening recommended for you and due dates:  Health Maintenance  Topic Date Due   Zoster (Shingles) Vaccine (1 of 2) 10/17/1970   COVID-19 Vaccine (4 - 2023-24 season) 06/27/2022   Screening for Lung Cancer  05/25/2023   Pneumonia Vaccine (2 of 2 - PCV) 02/03/2024*   Flu Shot  05/28/2023   Medicare Annual Wellness Visit  05/24/2024   Colon Cancer Screening  11/04/2026   Hepatitis C Screening  Completed   HPV Vaccine  Aged Out   DTaP/Tdap/Td vaccine  Discontinued  *Topic was postponed. The date shown is not the original due date.    Advanced directives: Advance directive discussed with you today. Even though you declined this today, please call our office should you change your mind, and we can give you the proper paperwork for you to fill out. Advance care planning is a way to make decisions about medical care that fits your values in case you are ever unable to make these decisions for yourself.  Information on Advanced Care Planning can be found at [facility] Advance Health Care Directives Advance Health Care Directives ([email or web address]    Conditions/risks identified: You have an order for:    [x]   Lung Cancer Screening  Please call for appointment:   [facility] [address] [place], [place] [address] [phone]  Make sure to wear two-piece clothing.  No lotions powders or deodorants the day of the appointment Make sure to bring picture ID and insurance card.  Bring list of medications you are currently taking including any  supplements.   Schedule your [facility] screening mammogram through MyChart!   Log into your MyChart account.  Go to 'Visit' (or 'Appointments' if on mobile App) --> Schedule an Appointment  Under 'Select a Reason for Visit' choose the Mammogram Screening option.  Complete the pre-visit questions and select the time and place that best fits your schedule.  Managing Pain Without Opioids Opioids are strong medicines used to treat moderate to severe pain. For some people, especially those who have long-term (chronic) pain, opioids may not be the best choice for pain management due to: Side effects like nausea, constipation, and sleepiness. The risk of addiction (opioid use disorder). The longer you take opioids, the greater your risk of addiction. Pain that lasts for more than 3 months is called chronic pain. Managing chronic pain usually requires more than one approach and is often provided by a team of health care providers working together (multidisciplinary approach). Pain management may be done at a pain management center or pain clinic. How to manage pain without the use of opioids Use non-opioid medicines Non-opioid medicines for pain may include: Over-the-counter or prescription non-steroidal anti-inflammatory drugs (NSAIDs). These may be the first medicines used for pain. They work well for muscle and bone pain, and they reduce swelling. Acetaminophen. This over-the-counter medicine may work well for milder pain but not swelling. Antidepressants. These may be used to  treat chronic pain. A certain type of antidepressant (tricyclics) is often used. These medicines are given in lower doses for pain than when used for depression. Anticonvulsants. These are usually used to treat seizures but may also reduce nerve (neuropathic) pain. Muscle relaxants. These relieve pain caused by sudden muscle tightening (spasms). You may also use a pain medicine that is applied to the skin as a patch,  cream, or gel (topical analgesic), such as a numbing medicine. These may cause fewer side effects than medicines taken by mouth. Do certain therapies as directed Some therapies can help with pain management. They include: Physical therapy. You will do exercises to gain strength and flexibility. A physical therapist may teach you exercises to move and stretch parts of your body that are weak, stiff, or painful. You can learn these exercises at physical therapy visits and practice them at home. Physical therapy may also involve: Massage. Heat wraps or applying heat or cold to affected areas. Electrical signals that interrupt pain signals (transcutaneous electrical nerve stimulation, TENS). Weak lasers that reduce pain and swelling (low-level laser therapy). Signals from your body that help you learn to regulate pain (biofeedback). Occupational therapy. This helps you to learn ways to function at home and work with less pain. Recreational therapy. This involves trying new activities or hobbies, such as a physical activity or drawing. Mental health therapy, including: Cognitive behavioral therapy (CBT). This helps you learn coping skills for dealing with pain. Acceptance and commitment therapy (ACT) to change the way you think and react to pain. Relaxation therapies, including muscle relaxation exercises and mindfulness-based stress reduction. Pain management counseling. This may be individual, family, or group counseling.  Receive medical treatments Medical treatments for pain management include: Nerve block injections. These may include a pain blocker and anti-inflammatory medicines. You may have injections: Near the spine to relieve chronic back or neck pain. Into joints to relieve back or joint pain. Into nerve areas that supply a painful area to relieve body pain. Into muscles (trigger point injections) to relieve some painful muscle conditions. A medical device placed near your spine to  help block pain signals and relieve nerve pain or chronic back pain (spinal cord stimulation device). Acupuncture. Follow these instructions at home Medicines Take over-the-counter and prescription medicines only as told by your health care provider. If you are taking pain medicine, ask your health care providers about possible side effects to watch out for. Do not drive or use heavy machinery while taking prescription opioid pain medicine. Lifestyle  Do not use drugs or alcohol to reduce pain. If you drink alcohol, limit how much you have to: 0-1 drink a day for women who are not pregnant. 0-2 drinks a day for men. Know how much alcohol is in a drink. In the [place] one drink equals one 12 oz bottle of beer (355 mL), one 5 oz glass of wine (148 mL), or one 1 oz glass of hard liquor (44 mL). Do not use any products that contain nicotine or tobacco. These products include cigarettes, chewing tobacco, and vaping devices, such as e-cigarettes. If you need help quitting, ask your health care provider. Eat a healthy diet and maintain a healthy weight. Poor diet and excess weight may make pain worse. Eat foods that are high in fiber. These include fresh fruits and vegetables, whole grains, and beans. Limit foods that are high in fat and processed sugars, such as fried and sweet foods. Exercise regularly. Exercise lowers stress and may help relieve pain.  Ask your health care provider what activities and exercises are safe for you. If your health care provider approves, join an exercise class that combines movement and stress reduction. Examples include yoga and tai chi. Get enough sleep. Lack of sleep may make pain worse. Lower stress as much as possible. Practice stress reduction techniques as told by your therapist. General instructions Work with all your pain management providers to find the treatments that work best for you. You are an important member of your pain management team. There are many  things you can do to reduce pain on your own. Consider joining an online or in-person support group for people who have chronic pain. Keep all follow-up visits. This is important. Where to find more information You can find more information about managing pain without opioids from: American Academy of Pain Medicine: painmed.org Institute for Chronic Pain: instituteforchronicpain.org American Chronic Pain Association: theacpa.org Contact a health care provider if: You have side effects from pain medicine. Your pain gets worse or does not get better with treatments or home therapy. You are struggling with anxiety or depression. Summary Many types of pain can be managed without opioids. Chronic pain may respond better to pain management without opioids. Pain is best managed when you and a team of health care providers work together. Pain management without opioids may include non-opioid medicines, medical treatments, physical therapy, mental health therapy, and lifestyle changes. Tell your health care providers if your pain gets worse or is not being managed well enough. This information is not intended to replace advice given to you by your health care provider. Make sure you discuss any questions you have with your health care provider. Document Revised: 01/23/2021 Document Reviewed: 01/23/2021 Elsevier Patient Education  2024 Elsevier Inc.    Next appointment: VIRTUAL/ TELEPHONE VISIT Follow up in one year for your annual wellness visit  July 06, 2024 at 9:00 am telephone visit   Preventive Care 76 Years and Older, Male  Preventive care refers to lifestyle choices and visits with your health care provider that can promote health and wellness. What does preventive care include? A yearly physical exam. This is also called an annual well check. Dental exams once or twice a year. Routine eye exams. Ask your health care provider how often you should have your eyes  checked. Personal lifestyle choices, including: Daily care of your teeth and gums. Regular physical activity. Eating a healthy diet. Avoiding tobacco and drug use. Limiting alcohol use. Practicing safe sex. Taking low doses of aspirin every day. Taking vitamin and mineral supplements as recommended by your health care provider. What happens during an annual well check? The services and screenings done by your health care provider during your annual well check will depend on your age, overall health, lifestyle risk factors, and family history of disease. Counseling  Your health care provider may ask you questions about your: Alcohol use. Tobacco use. Drug use. Emotional well-being. Home and relationship well-being. Sexual activity. Eating habits. History of falls. Memory and ability to understand (cognition). Work and work Astronomer. Screening  You may have the following tests or measurements: Height, weight, and BMI. Blood pressure. Lipid and cholesterol levels. These may be checked every 5 years, or more frequently if you are over 38 years old. Skin check. Lung cancer screening. You may have this screening every year starting at age 64 if you have a 30-pack-year history of smoking and currently smoke or have quit within the past 15 years. Fecal occult blood test (  FOBT) of the stool. You may have this test every year starting at age 42. Flexible sigmoidoscopy or colonoscopy. You may have a sigmoidoscopy every 5 years or a colonoscopy every 10 years starting at age 21. Prostate cancer screening. Recommendations will vary depending on your family history and other risks. Hepatitis C blood test. Hepatitis B blood test. Sexually transmitted disease (STD) testing. Diabetes screening. This is done by checking your blood sugar (glucose) after you have not eaten for a while (fasting). You may have this done every 1-3 years. Abdominal aortic aneurysm (AAA) screening. You may need this  if you are a current or former smoker. Osteoporosis. You may be screened starting at age 75 if you are at high risk. Talk with your health care provider about your test results, treatment options, and if necessary, the need for more tests. Vaccines  Your health care provider may recommend certain vaccines, such as: Influenza vaccine. This is recommended every year. Tetanus, diphtheria, and acellular pertussis (Tdap, Td) vaccine. You may need a Td booster every 10 years. Zoster vaccine. You may need this after age 72. Pneumococcal 13-valent conjugate (PCV13) vaccine. One dose is recommended after age 19. Pneumococcal polysaccharide (PPSV23) vaccine. One dose is recommended after age 58. Talk to your health care provider about which screenings and vaccines you need and how often you need them. This information is not intended to replace advice given to you by your health care provider. Make sure you discuss any questions you have with your health care provider. Document Released: 11/09/2015 Document Revised: 07/02/2016 Document Reviewed: 08/14/2015 Elsevier Interactive Patient Education  2017 [employer].  Fall Prevention in the Home Falls can cause injuries. They can happen to people of all ages. There are many things you can do to make your home safe and to help prevent falls. What can I do on the outside of my home? Regularly fix the edges of walkways and driveways and fix any cracks. Remove anything that might make you trip as you walk through a door, such as a raised step or threshold. Trim any bushes or trees on the path to your home. Use bright outdoor lighting. Clear any walking paths of anything that might make someone trip, such as rocks or tools. Regularly check to see if handrails are loose or broken. Make sure that both sides of any steps have handrails. Any raised decks and porches should have guardrails on the edges. Have any leaves, snow, or ice cleared regularly. Use sand  or salt on walking paths during winter. Clean up any spills in your garage right away. This includes oil or grease spills. What can I do in the bathroom? Use night lights. Install grab bars by the toilet and in the tub and shower. Do not use towel bars as grab bars. Use non-skid mats or decals in the tub or shower. If you need to sit down in the shower, use a plastic, non-slip stool. Keep the floor dry. Clean up any water that spills on the floor as soon as it happens. Remove soap buildup in the tub or shower regularly. Attach bath mats securely with double-sided non-slip rug tape. Do not have throw rugs and other things on the floor that can make you trip. What can I do in the bedroom? Use night lights. Make sure that you have a light by your bed that is easy to reach. Do not use any sheets or blankets that are too big for your bed. They should not hang down  onto the floor. Have a firm chair that has side arms. You can use this for support while you get dressed. Do not have throw rugs and other things on the floor that can make you trip. What can I do in the kitchen? Clean up any spills right away. Avoid walking on wet floors. Keep items that you use a lot in easy-to-reach places. If you need to reach something above you, use a strong step stool that has a grab bar. Keep electrical cords out of the way. Do not use floor polish or wax that makes floors slippery. If you must use wax, use non-skid floor wax. Do not have throw rugs and other things on the floor that can make you trip. What can I do with my stairs? Do not leave any items on the stairs. Make sure that there are handrails on both sides of the stairs and use them. Fix handrails that are broken or loose. Make sure that handrails are as long as the stairways. Check any carpeting to make sure that it is firmly attached to the stairs. Fix any carpet that is loose or worn. Avoid having throw rugs at the top or bottom of the stairs.  If you do have throw rugs, attach them to the floor with carpet tape. Make sure that you have a light switch at the top of the stairs and the bottom of the stairs. If you do not have them, ask someone to add them for you. What else can I do to help prevent falls? Wear shoes that: Do not have high heels. Have rubber bottoms. Are comfortable and fit you well. Are closed at the toe. Do not wear sandals. If you use a stepladder: Make sure that it is fully opened. Do not climb a closed stepladder. Make sure that both sides of the stepladder are locked into place. Ask someone to hold it for you, if possible. Clearly mark and make sure that you can see: Any grab bars or handrails. First and last steps. Where the edge of each step is. Use tools that help you move around (mobility aids) if they are needed. These include: Canes. Walkers. Scooters. Crutches. Turn on the lights when you go into a dark area. Replace any light bulbs as soon as they burn out. Set up your furniture so you have a clear path. Avoid moving your furniture around. If any of your floors are uneven, fix them. If there are any pets around you, be aware of where they are. Review your medicines with your doctor. Some medicines can make you feel dizzy. This can increase your chance of falling. Ask your doctor what other things that you can do to help prevent falls. This information is not intended to replace advice given to you by your health care provider. Make sure you discuss any questions you have with your health care provider. Document Released: 08/09/2009 Document Revised: 03/20/2016 Document Reviewed: 11/17/2014 Elsevier Interactive Patient Education  2017 [employer].

## 2023-05-25 NOTE — Progress Notes (Signed)
Because this visit was a virtual/telehealth visit,  certain criteria was not obtained, such a blood pressure, CBG if patient is a diabetic, and timed up and go. Any medications not marked as "taking" was not mentioned during the medication reconciliation part of the visit. Any vitals not documented were not able to be obtained due to this being a telehealth visit. Vitals documented are verbally provided by the patient.  Per patient no change in vitals since last visit, unable to obtain new vitals due to telehealth visit.   Subjective:   Dillon [name] is a 72 y.o. male who presents for Medicare Annual/Subsequent preventive examination.  Visit Complete: Virtual  I connected with  Dillon [name] on 05/25/23 by a audio enabled telemedicine application and verified that I am speaking with the correct person using two identifiers.  Patient Location: Home  Provider Location: Home Office  I discussed the limitations of evaluation and management by telemedicine. The patient expressed understanding and agreed to proceed.  Patient Medicare AWV questionnaire was completed by the patient on n/a; I have confirmed that all information answered by patient is correct and no changes since this date.  Review of Systems     Cardiac Risk Factors include: advanced age (>67men, >84 women);dyslipidemia;male gender;smoking/ tobacco exposure;hypertension     Objective:    Today's Vitals   05/25/23 1458  Weight: 161 lb (73 kg)  Height: 6' 1.5" (1.867 m)   Body mass index is 20.95 kg/m.     05/25/2023    3:01 PM 11/04/2021    9:49 AM 11/01/2021    1:15 PM 05/08/2021    3:36 PM 12/27/2018    9:55 AM 11/07/2018    9:10 AM 10/14/2018    7:19 AM  Advanced Directives  Does Patient Have a Medical Advance Directive? No No No Yes No No No  Type of Advance Directive    Healthcare Power of Attorney     Does patient want to make changes to medical advance directive?    No - Patient declined     Copy of Healthcare  Power of Attorney in Chart?    No - copy requested     Would patient like information on creating a medical advance directive? No - Patient declined No - Patient declined No - Patient declined  No - Patient declined  No - Patient declined    Current Medications (verified) Outpatient Encounter Medications as of 05/25/2023  Medication Sig   ALPHAGAN P 0.1 % SOLN Place 1 drop into both eyes in the morning and at bedtime.   amLODipine (NORVASC) 10 MG tablet TAKE 1 TABLET BY MOUTH EVERY DAY   aspirin EC 81 MG tablet Take 81 mg by mouth daily.   atorvastatin (LIPITOR) 80 MG tablet TAKE 1 TABLET BY MOUTH EVERY DAY   cilostazol (PLETAL) 100 MG tablet Take 1 tablet (100 mg total) by mouth 2 (two) times daily before a meal.   cyclobenzaprine (FLEXERIL) 10 MG tablet TAKE 1 TABLET (10 MG TOTAL) BY MOUTH AT BEDTIME AS NEEDED FOR SPASM.   dorzolamide-timolol (COSOPT) 22.3-6.8 MG/ML ophthalmic solution 1 drop 2 (two) times daily.   ergocalciferol (VITAMIN D2) 1.25 MG (50000 UT) capsule Take 1 capsule (50,000 Units total) by mouth once a week. One capsule once weekly   fluticasone-salmeterol (ADVAIR) 100-50 MCG/ACT AEPB Inhale 1 puff into the lungs 2 (two) times daily.   gabapentin (NEURONTIN) 300 MG capsule TAKE 1 CAPSULE (300 MG TOTAL) BY MOUTH AT BEDTIME.   HYDROcodone-acetaminophen (  NORCO) 7.5-325 MG tablet One tablet every six hours as need for pain.  Must last 28 days.   ipratropium (ATROVENT) 0.02 % nebulizer solution Take 2.5 mLs (0.5 mg total) by nebulization every 6 (six) hours as needed for wheezing or shortness of breath.   latanoprost (XALATAN) 0.005 % ophthalmic solution 1 drop daily.   levalbuterol (XOPENEX) 0.63 MG/3ML nebulizer solution Take 3 mLs (0.63 mg total) by nebulization every 8 (eight) hours as needed for wheezing or shortness of breath.   RHOPRESSA 0.02 % SOLN Place 1 drop into the right eye at bedtime.   rosuvastatin (CRESTOR) 40 MG tablet Take 1 tablet (40 mg total) by mouth daily.    spironolactone (ALDACTONE) 50 MG tablet TAKE 1 TABLET BY MOUTH EVERY DAY   UNABLE TO FIND Nebulizer facemask and tubing x 1  DX J44.9   No facility-administered encounter medications on file as of 05/25/2023.    Allergies (verified) Penicillins   History: Past Medical History:  Diagnosis Date   Arthritis    2012   Asthma    childhood   COPD (chronic obstructive pulmonary disease) (HCC)    Glaucoma    Hyperlipidemia    1990   Hypertension    1990   Nicotine addiction    PAD (peripheral artery disease) (HCC) 2015   Past Surgical History:  Procedure Laterality Date   COLONOSCOPY WITH PROPOFOL N/A 11/04/2021   Procedure: COLONOSCOPY WITH PROPOFOL;  Surgeon: Lanelle Bal, DO;  Location: [facility];  Service: Endoscopy;  Laterality: N/A;  9:30am   KNEE ARTHROSCOPY     both knees    LOWER EXTREMITY ANGIOGRAPHY N/A 02/04/2018   Procedure: LOWER EXTREMITY ANGIOGRAPHY;  Surgeon: Fransisco Hertz, MD;  Location: [facility] INVASIVE CV LAB;  Service: Cardiovascular;  Laterality: N/A;   POLYPECTOMY  11/04/2021   Procedure: POLYPECTOMY;  Surgeon: Lanelle Bal, DO;  Location: [facility];  Service: Endoscopy;;   Family History  Problem Relation Age of Onset   Heart attack Mother    Diabetes Mother    Hypertension Mother    Stroke Brother    Hypertension Sister    Hypertension Sister    Hypertension Sister    Hypertension Brother    Colon cancer Neg Hx    Social History   Socioeconomic History   Marital status: Single    Spouse name: Not on file   Number of children: Not on file   Years of education: Not on file   Highest education level: Not on file  Occupational History   Not on file  Tobacco Use   Smoking status: Every Day    Current packs/day: 0.25    Average packs/day: 0.3 packs/day for 53.6 years (13.4 ttl pk-yrs)    Types: Cigarettes    Start date: 10/16/1969    Passive exposure: Never   Smokeless tobacco: Never  Vaping Use   Vaping status: Never Used   Substance and Sexual Activity   Alcohol use: Yes    Comment: occassional, max of 72 oz pwr month   Drug use: No   Sexual activity: Yes    Partners: Female  Other Topics Concern   Not on file  Social History Narrative   Not on file   Social Determinants of Health   Financial Resource Strain: Low Risk  (05/25/2023)   Overall Financial Resource Strain (CARDIA)    Difficulty of Paying Living Expenses: Not hard at all  Food Insecurity: No Food Insecurity (05/25/2023)   Hunger  Vital Sign    Worried About Programme researcher, broadcasting/film/video in the Last Year: Never true    Ran Out of Food in the Last Year: Never true  Transportation Needs: No Transportation Needs (05/25/2023)   PRAPARE - Administrator, Civil Service (Medical): No    Lack of Transportation (Non-Medical): No  Physical Activity: Sufficiently Active (05/25/2023)   Exercise Vital Sign    Days of Exercise per Week: 7 days    Minutes of Exercise per Session: 30 min  Stress: No Stress Concern Present (05/25/2023)   [employer] of Occupational Health - Occupational Stress Questionnaire    Feeling of Stress : Not at all  Social Connections: Moderately Isolated (05/25/2023)   Social Connection and Isolation Panel [NHANES]    Frequency of Communication with Friends and Family: More than three times a week    Frequency of Social Gatherings with Friends and Family: More than three times a week    Attends Religious Services: 1 to 4 times per year    Active Member of [employer] or Organizations: No    Attends Engineer, structural: Never    Marital Status: Never married    Tobacco Counseling Ready to quit: Yes Counseling given: Yes   Clinical Intake:  Pre-visit preparation completed: Yes  Pain : No/denies pain     BMI - recorded: 20.95 Nutritional Status: BMI of 19-24  Normal Nutritional Risks: None Diabetes: No  How often do you need to have someone help you when you read instructions, pamphlets, or other written  materials from your doctor or pharmacy?: 1 - Never  Interpreter Needed?: No  Information entered by :: Abby Darnella Zeiter, CMA   Activities of Daily Living    05/25/2023    3:00 PM  In your present state of health, do you have any difficulty performing the following activities:  Hearing? 0  Vision? 0  Difficulty concentrating or making decisions? 0  Walking or climbing stairs? 0  Dressing or bathing? 0  Doing errands, shopping? 0  Preparing Food and eating ? N  Using the Toilet? N  In the past six months, have you accidently leaked urine? N  Do you have problems with loss of bowel control? N  Managing your Medications? N  Managing your Finances? N  Housekeeping or managing your Housekeeping? N    Patient Care Team: Kerri Perches, MD as PCP - General (Family Medicine) Wyline Mood, Dorothe Pea, MD as PCP - Cardiology (Cardiology) Wyline Mood Dorothe Pea, MD as Consulting Physician (Cardiology) Bevelyn Ngo, NP as Nurse Practitioner (Pulmonary Disease) Darreld Mclean, MD as Consulting Physician (Orthopedic Surgery) West Bali, MD (Inactive) as Consulting Physician (Gastroenterology)  Indicate any recent Medical Services you may have received from other than [facility] providers in the past year (date may be approximate).     Assessment:   This is a routine wellness examination for Hendry.  Hearing/Vision screen Hearing Screening - Comments:: Patient denies any hearing difficulties.    Dietary issues and exercise activities discussed:     Goals Addressed             This Visit's Progress    Quit Smoking   On track    Would like to quit smoking. Has cut way back. GREAT JOB!       Depression Screen    05/25/2023    3:02 PM 02/03/2023    8:07 AM 07/31/2022    8:07 AM 01/29/2022    8:07 AM 10/17/2021  9:07 AM 05/17/2021   10:34 AM 05/08/2021    3:36 PM  PHQ 2/9 Scores  PHQ - 2 Score 0 0 0 0 0 0 0    Fall Risk    05/25/2023    3:01 PM 02/03/2023    8:06 AM 07/31/2022     8:07 AM 05/09/2022    3:24 PM 01/29/2022    8:07 AM  Fall Risk   Falls in the past year? 0 0 0 0 0  Number falls in past yr: 0 0 0 0 0  Injury with Fall? 0 0 0 0 0  Risk for fall due to : No Fall Risks No Fall Risks No Fall Risks  No Fall Risks  Follow up Falls prevention discussed Falls evaluation completed Falls evaluation completed  Falls evaluation completed    MEDICARE RISK AT HOME:  Medicare Risk at Home - 05/25/23 1501     Any stairs in or around the home? No    If so, are there any without handrails? No    Home free of loose throw rugs in walkways, pet beds, electrical cords, etc? Yes    Adequate lighting in your home to reduce risk of falls? Yes    Life alert? No    Use of a cane, walker or w/c? No    Grab bars in the bathroom? No    Shower chair or bench in shower? No    Elevated toilet seat or a handicapped toilet? No             TIMED UP AND GO:  Was the test performed?  No    Cognitive Function:    05/08/2021    3:37 PM  MMSE - Mini Mental State Exam  Not completed: Unable to complete        05/25/2023    3:02 PM 05/09/2022    3:25 PM 05/08/2021    3:37 PM  6CIT Screen  What Year? 0 points 0 points 0 points  What month? 0 points 0 points 0 points  What time? 0 points 0 points 0 points  Count back from 20 0 points 0 points 0 points  Months in reverse 0 points 0 points 0 points  Repeat phrase 0 points 2 points 0 points  Total Score 0 points 2 points 0 points    Immunizations Immunization History  Administered Date(s) Administered   Moderna Sars-Covid-2 Vaccination 03/08/2020, 04/05/2020, 10/19/2020   Pneumococcal Polysaccharide-23 05/19/2013   Tdap 03/26/2011   Zoster, Live 05/19/2013    TDAP status: Up to date  Flu Vaccine status: Up to date  Pneumococcal vaccine status: Due, Education has been provided regarding the importance of this vaccine. Advised may receive this vaccine at local pharmacy or Health Dept. Aware to provide a copy of the  vaccination record if obtained from local pharmacy or Health Dept. Verbalized acceptance and understanding.  Covid-19 vaccine status: Information provided on how to obtain vaccines.   Qualifies for Shingles Vaccine? Yes   Zostavax completed No   Shingrix Completed?: No.    Education has been provided regarding the importance of this vaccine. Patient has been advised to call insurance company to determine out of pocket expense if they have not yet received this vaccine. Advised may also receive vaccine at local pharmacy or Health Dept. Verbalized acceptance and understanding.  Screening Tests Health Maintenance  Topic Date Due   Zoster Vaccines- Shingrix (1 of 2) 10/17/1970   COVID-19 Vaccine (4 - 2023-24 season) 06/27/2022  Lung Cancer Screening  05/25/2023   Pneumonia Vaccine 9+ Years old (2 of 2 - PCV) 02/03/2024 (Originally 05/19/2014)   INFLUENZA VACCINE  05/28/2023   Medicare Annual Wellness (AWV)  05/24/2024   Colonoscopy  11/04/2026   Hepatitis C Screening  Completed   HPV VACCINES  Aged Out   DTaP/Tdap/Td  Discontinued    Health Maintenance  Health Maintenance Due  Topic Date Due   Zoster Vaccines- Shingrix (1 of 2) 10/17/1971   COVID-19 Vaccine (4 - 2023-24 season) 06/27/2022   Lung Cancer Screening  05/25/2023    Colorectal cancer screening: Type of screening: Colonoscopy. Completed 11/04/2021. Repeat every 5 years  Lung Cancer Screening: (Low Dose CT Chest recommended if Age 38-80 years, 20 pack-year currently smoking OR have quit w/in 15years.) does qualify.   Lung Cancer Screening Referral: 05/25/2023  Additional Screening:  Hepatitis C Screening: does not qualify; Completed 01/24/2016  Vision Screening: Recommended annual ophthalmology exams for early detection of glaucoma and other disorders of the eye. Is the patient up to date with their annual eye exam?  Yes  Who is the provider or what is the name of the office in which the patient attends annual eye exams?  [facility] [place] If pt is not established with a provider, would they like to be referred to a provider to establish care? No .   Dental Screening: Recommended annual dental exams for proper oral hygiene  Diabetic Foot Exam: n/a  Community Resource Referral / Chronic Care Management: CRR required this visit?  No   CCM required this visit?  No     Plan:     I have personally reviewed and noted the following in the patient's chart:   Medical and social history Use of alcohol, tobacco or illicit drugs  Current medications and supplements including opioid prescriptions. Patient is currently taking opioid prescriptions. Information provided to patient regarding non-opioid alternatives. Patient advised to discuss non-opioid treatment plan with their provider. Functional ability and status Nutritional status Physical activity Advanced directives List of other physicians Hospitalizations, surgeries, and ER visits in previous 12 months Vitals Screenings to include cognitive, depression, and falls Referrals and appointments  In addition, I have reviewed and discussed with patient certain preventive protocols, quality metrics, and best practice recommendations. A written personalized care plan for preventive services as well as general preventive health recommendations were provided to patient.     Jordan Hawks Bocephus Cali, CMA   05/25/2023   After Visit Summary: (Mail) Due to this being a telephonic visit, the after visit summary with patients personalized plan was offered to patient via mail   Nurse Notes: Lung Cancer Screening ordered today

## 2023-06-03 ENCOUNTER — Other Ambulatory Visit (INDEPENDENT_AMBULATORY_CARE_PROVIDER_SITE_OTHER): Payer: Medicare HMO

## 2023-06-03 ENCOUNTER — Ambulatory Visit (INDEPENDENT_AMBULATORY_CARE_PROVIDER_SITE_OTHER): Payer: Medicare HMO | Admitting: Orthopaedic Surgery

## 2023-06-03 ENCOUNTER — Encounter: Payer: Self-pay | Admitting: Orthopaedic Surgery

## 2023-06-03 VITALS — BP 149/75 | HR 102

## 2023-06-03 DIAGNOSIS — G8929 Other chronic pain: Secondary | ICD-10-CM | POA: Diagnosis not present

## 2023-06-03 DIAGNOSIS — M5442 Lumbago with sciatica, left side: Secondary | ICD-10-CM

## 2023-06-03 DIAGNOSIS — S62610A Displaced fracture of proximal phalanx of right index finger, initial encounter for closed fracture: Secondary | ICD-10-CM | POA: Diagnosis not present

## 2023-06-03 DIAGNOSIS — M79641 Pain in right hand: Secondary | ICD-10-CM

## 2023-06-03 DIAGNOSIS — F17210 Nicotine dependence, cigarettes, uncomplicated: Secondary | ICD-10-CM

## 2023-06-03 MED ORDER — HYDROCODONE-ACETAMINOPHEN 7.5-325 MG PO TABS: ORAL_TABLET | ORAL | 0 refills | Status: DC
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
## 2023-06-18 ENCOUNTER — Other Ambulatory Visit: Payer: Self-pay | Admitting: Family Medicine

## 2023-06-18 DIAGNOSIS — I10 Essential (primary) hypertension: Secondary | ICD-10-CM

## 2023-06-24 ENCOUNTER — Other Ambulatory Visit (INDEPENDENT_AMBULATORY_CARE_PROVIDER_SITE_OTHER): Payer: Medicare HMO

## 2023-06-24 ENCOUNTER — Ambulatory Visit (INDEPENDENT_AMBULATORY_CARE_PROVIDER_SITE_OTHER): Payer: Medicare HMO | Admitting: Orthopaedic Surgery

## 2023-06-24 ENCOUNTER — Encounter: Payer: Self-pay | Admitting: Orthopaedic Surgery

## 2023-06-24 VITALS — BP 147/77 | HR 94 | Ht 74.0 in | Wt 151.0 lb

## 2023-06-24 DIAGNOSIS — S62610D Displaced fracture of proximal phalanx of right index finger, subsequent encounter for fracture with routine healing: Secondary | ICD-10-CM

## 2023-06-24 DIAGNOSIS — M79641 Pain in right hand: Secondary | ICD-10-CM

## 2023-06-24 NOTE — Progress Notes (Signed)
My hand is tender.  He has some swelling of the right index finger second metacarpal head area.  He has no new trauma, no redness.  X-rays were done of the right hand, reported separately.  NV intact.  There is swelling of the right index finger over the MCP joint.  ROM is decreased secondary to pain.  Encounter Diagnoses  Name Primary?   Right hand pain Yes   Closed displaced fracture of proximal phalanx of right index finger with routine healing, subsequent encounter    I have told him to begin exercising the index finger and use Aspercreme, Biofreeze or Voltaren Gel.  Return in three weeks.  X-rays then.  Call if any problem.  Precautions discussed.  Electronically Signed Darreld Mclean, MD 8/28/20248:19 AM

## 2023-06-27 ENCOUNTER — Emergency Department (HOSPITAL_COMMUNITY)
Admission: EM | Admit: 2023-06-27 | Discharge: 2023-06-27 | Disposition: A | Discharge status: Home / Self Care | Payer: Medicare HMO | Attending: Emergency Medicine | Admitting: Emergency Medicine

## 2023-06-27 ENCOUNTER — Encounter (HOSPITAL_COMMUNITY): Payer: Self-pay | Admitting: Pharmacy Technician

## 2023-06-27 ENCOUNTER — Other Ambulatory Visit: Payer: Self-pay

## 2023-06-27 ENCOUNTER — Emergency Department (HOSPITAL_COMMUNITY): Payer: Medicare HMO

## 2023-06-27 DIAGNOSIS — R1031 Right lower quadrant pain: Secondary | ICD-10-CM | POA: Diagnosis not present

## 2023-06-27 DIAGNOSIS — Z79899 Other long term (current) drug therapy: Secondary | ICD-10-CM | POA: Diagnosis not present

## 2023-06-27 DIAGNOSIS — Z7982 Long term (current) use of aspirin: Secondary | ICD-10-CM | POA: Insufficient documentation

## 2023-06-27 DIAGNOSIS — F17200 Nicotine dependence, unspecified, uncomplicated: Secondary | ICD-10-CM | POA: Insufficient documentation

## 2023-06-27 DIAGNOSIS — I2510 Atherosclerotic heart disease of native coronary artery without angina pectoris: Secondary | ICD-10-CM | POA: Diagnosis not present

## 2023-06-27 DIAGNOSIS — R109 Unspecified abdominal pain: Secondary | ICD-10-CM

## 2023-06-27 DIAGNOSIS — R079 Chest pain, unspecified: Secondary | ICD-10-CM | POA: Diagnosis present

## 2023-06-27 DIAGNOSIS — J449 Chronic obstructive pulmonary disease, unspecified: Secondary | ICD-10-CM | POA: Diagnosis not present

## 2023-06-27 DIAGNOSIS — J168 Pneumonia due to other specified infectious organisms: Secondary | ICD-10-CM | POA: Diagnosis not present

## 2023-06-27 DIAGNOSIS — R0602 Shortness of breath: Secondary | ICD-10-CM | POA: Diagnosis not present

## 2023-06-27 DIAGNOSIS — I10 Essential (primary) hypertension: Secondary | ICD-10-CM | POA: Insufficient documentation

## 2023-06-27 DIAGNOSIS — J45909 Unspecified asthma, uncomplicated: Secondary | ICD-10-CM | POA: Diagnosis not present

## 2023-06-27 DIAGNOSIS — J181 Lobar pneumonia, unspecified organism: Secondary | ICD-10-CM | POA: Insufficient documentation

## 2023-06-27 DIAGNOSIS — N400 Enlarged prostate without lower urinary tract symptoms: Secondary | ICD-10-CM | POA: Diagnosis not present

## 2023-06-27 DIAGNOSIS — R0789 Other chest pain: Secondary | ICD-10-CM | POA: Diagnosis not present

## 2023-06-27 DIAGNOSIS — N3000 Acute cystitis without hematuria: Secondary | ICD-10-CM | POA: Diagnosis not present

## 2023-06-27 DIAGNOSIS — R918 Other nonspecific abnormal finding of lung field: Secondary | ICD-10-CM | POA: Diagnosis not present

## 2023-06-27 DIAGNOSIS — R0781 Pleurodynia: Secondary | ICD-10-CM | POA: Diagnosis not present

## 2023-06-27 DIAGNOSIS — R0689 Other abnormalities of breathing: Secondary | ICD-10-CM | POA: Diagnosis not present

## 2023-06-27 DIAGNOSIS — I714 Abdominal aortic aneurysm, without rupture, unspecified: Secondary | ICD-10-CM | POA: Diagnosis not present

## 2023-06-27 DIAGNOSIS — J189 Pneumonia, unspecified organism: Secondary | ICD-10-CM

## 2023-06-27 LAB — URINALYSIS, ROUTINE W REFLEX MICROSCOPIC
Bilirubin Urine: NEGATIVE
Glucose, UA: NEGATIVE mg/dL
Ketones, ur: NEGATIVE mg/dL
Nitrite: NEGATIVE
Protein, ur: 100 mg/dL — AB
Specific Gravity, Urine: <1.005 — ABNORMAL LOW (ref 1.005–1.030)
WBC, UA: >50 WBC/hpf (ref 0–5)
pH: 5 (ref 5.0–8.0)

## 2023-06-27 LAB — CBC WITH DIFFERENTIAL/PLATELET
Abs Immature Granulocytes: 0.6 10*3/uL — ABNORMAL HIGH (ref 0.00–0.07)
Basophils Absolute: 0 10*3/uL (ref 0.0–0.1)
Basophils Relative: 0 %
Eosinophils Absolute: 0 10*3/uL (ref 0.0–0.5)
Eosinophils Relative: 0 %
HCT: 28.9 % — ABNORMAL LOW (ref 39.0–52.0)
Hemoglobin: 9.5 g/dL — ABNORMAL LOW (ref 13.0–17.0)
Immature Granulocytes: 3 %
Lymphocytes Relative: 3 %
Lymphs Abs: 0.7 10*3/uL (ref 0.7–4.0)
MCH: 27.4 pg (ref 26.0–34.0)
MCHC: 32.9 g/dL (ref 30.0–36.0)
MCV: 83.3 fL (ref 80.0–100.0)
Monocytes Absolute: 0.9 10*3/uL (ref 0.1–1.0)
Monocytes Relative: 4 %
Neutro Abs: 19.8 10*3/uL — ABNORMAL HIGH (ref 1.7–7.7)
Neutrophils Relative %: 90 %
Platelets: 388 10*3/uL (ref 150–400)
RBC: 3.47 MIL/uL — ABNORMAL LOW (ref 4.22–5.81)
RDW: 17.3 % — ABNORMAL HIGH (ref 11.5–15.5)
WBC: 22 10*3/uL — ABNORMAL HIGH (ref 4.0–10.5)
nRBC: 0 % (ref 0.0–0.2)

## 2023-06-27 LAB — COMPREHENSIVE METABOLIC PANEL
ALT: 12 U/L (ref 0–44)
AST: 21 U/L (ref 15–41)
Albumin: 3.5 g/dL (ref 3.5–5.0)
Alkaline Phosphatase: 72 U/L (ref 38–126)
Anion gap: 11 (ref 5–15)
BUN: 22 mg/dL (ref 8–23)
CO2: 22 mmol/L (ref 22–32)
Calcium: 8.7 mg/dL — ABNORMAL LOW (ref 8.9–10.3)
Chloride: 99 mmol/L (ref 98–111)
Creatinine, Ser: 1.31 mg/dL — ABNORMAL HIGH (ref 0.61–1.24)
GFR, Estimated: 59 mL/min — ABNORMAL LOW (ref >60)
Glucose, Bld: 156 mg/dL — ABNORMAL HIGH (ref 70–99)
Potassium: 3.6 mmol/L (ref 3.5–5.1)
Sodium: 132 mmol/L — ABNORMAL LOW (ref 135–145)
Total Bilirubin: 1 mg/dL (ref 0.3–1.2)
Total Protein: 7.3 g/dL (ref 6.5–8.1)

## 2023-06-27 LAB — LIPASE, BLOOD: Lipase: 31 U/L (ref 11–51)

## 2023-06-27 MED ORDER — LEVOFLOXACIN IN D5W 750 MG/150ML IV SOLN
750.0000 mg | Freq: Once | INTRAVENOUS | Status: AC
  Administered 2023-06-27: 750 mg via INTRAVENOUS
  Filled 2023-06-27: qty 150

## 2023-06-27 MED ORDER — LEVOFLOXACIN 750 MG PO TABS: 750.0000 mg | ORAL_TABLET | Freq: Every day | ORAL | 0 refills | Status: DC

## 2023-06-27 MED ORDER — IOHEXOL 350 MG/ML SOLN
75.0000 mL | Freq: Once | INTRAVENOUS | Status: AC | PRN
  Administered 2023-06-27: 75 mL via INTRAVENOUS

## 2023-06-27 NOTE — Discharge Instructions (Addendum)
Identified a right-sided pneumonia and urinary tract infection.  Take the antibiotic Levaquin as directed for the next 7 days.  Would expect improvement over the next 2 days.  If you get worse at all you come back to get seen.  Call 911 if needed.  Make an appointment to follow-up with your doctor Dr. Lodema Hong.  We had an incidental finding of abdominal aortic aneurysm that we will just need to get followed yearly by her.  Make an appointment follow with Dr. Lodema Hong either way so that she can make sure you get better on the antibiotics.

## 2023-06-27 NOTE — ED Provider Notes (Signed)
[place] EMERGENCY DEPARTMENT AT [facility] Provider Note   CSN: [id number] Arrival date & time: 06/27/23  1609     History  Chief Complaint  Patient presents with   Flank Pain    Dillon [name] is a 72 y.o. male.  Patient brought in by EMS.  Patient with a complaint of right-sided chest pain and right-sided abdominal pain for 2 days.  No nausea vomiting or diarrhea.  Has a history of asthma.  EMS did give him an albuterol and route with improvement in the pain and also room air oxygen saturations were good though at 96%.  Right ear 96%.  Temp 99.1.  Patient denies any cough or congestion.  Past medical history significant for nicotine addiction COPD asthma hypertension hyperlipidemia peripheral vascular disease.  Patient is every day smoker.  Patient states she does have albuterol inhalers at home.       Home Medications Prior to Admission medications   Medication Sig Start Date End Date Taking? Authorizing Provider  levofloxacin (LEVAQUIN) 750 MG tablet Take 1 tablet (750 mg total) by mouth daily. 06/27/23  Yes Vanetta Mulders, MD  ALPHAGAN P 0.1 % SOLN Place 1 drop into both eyes in the morning and at bedtime. 01/28/21   [provider]  amLODipine (NORVASC) 10 MG tablet TAKE 1 TABLET BY MOUTH EVERY DAY 06/18/23   Kerri Perches, MD  aspirin EC 81 MG tablet Take 81 mg by mouth daily.    [provider]  atorvastatin (LIPITOR) 80 MG tablet TAKE 1 TABLET BY MOUTH EVERY DAY 05/25/23   Kerri Perches, MD  cilostazol (PLETAL) 100 MG tablet Take 1 tablet (100 mg total) by mouth 2 (two) times daily before a meal. 02/25/23   Early, Kristen Loader, MD  cyclobenzaprine (FLEXERIL) 10 MG tablet TAKE 1 TABLET (10 MG TOTAL) BY MOUTH AT BEDTIME AS NEEDED FOR SPASM. 08/27/22   Darreld Mclean, MD  dorzolamide-timolol (COSOPT) 22.3-6.8 MG/ML ophthalmic solution 1 drop 2 (two) times daily. 01/31/22   [provider]  ergocalciferol (VITAMIN D2) 1.25 MG (50000 UT)  capsule Take 1 capsule (50,000 Units total) by mouth once a week. One capsule once weekly 07/31/22   Kerri Perches, MD  fluticasone-salmeterol (ADVAIR) 100-50 MCG/ACT AEPB Inhale 1 puff into the lungs 2 (two) times daily. 04/28/23   Anabel Halon, MD  gabapentin (NEURONTIN) 300 MG capsule TAKE 1 CAPSULE (300 MG TOTAL) BY MOUTH AT BEDTIME. 12/21/15   Kerri Perches, MD  HYDROcodone-acetaminophen (NORCO) 7.5-325 MG tablet One tablet every six hours as need for pain.  Must last 28 days. 06/03/23   Darreld Mclean, MD  ipratropium (ATROVENT) 0.02 % nebulizer solution Take 2.5 mLs (0.5 mg total) by nebulization every 6 (six) hours as needed for wheezing or shortness of breath. 06/09/18   Kerri Perches, MD  latanoprost (XALATAN) 0.005 % ophthalmic solution 1 drop daily. 01/05/21   [provider]  levalbuterol Pauline Aus) 0.63 MG/3ML nebulizer solution Take 3 mLs (0.63 mg total) by nebulization every 8 (eight) hours as needed for wheezing or shortness of breath. 12/13/20   Kerri Perches, MD  RHOPRESSA 0.02 % SOLN Place 1 drop into the right eye at bedtime. 05/17/21   [provider]  rosuvastatin (CRESTOR) 40 MG tablet Take 1 tablet (40 mg total) by mouth daily. 05/14/23 08/12/23  Strader, Lennart Pall, PA-C  spironolactone (ALDACTONE) 50 MG tablet TAKE 1 TABLET BY MOUTH EVERY DAY 06/18/23   Kerri Perches,  MD  UNABLE TO FIND Nebulizer facemask and tubing x 1  DX J44.9 10/28/18   Kerri Perches, MD      Allergies    Penicillins    Review of Systems   Review of Systems  Constitutional:  Negative for chills and fever.  HENT:  Negative for ear pain and sore throat.   Eyes:  Negative for pain and visual disturbance.  Respiratory:  Negative for cough and shortness of breath.   Cardiovascular:  Positive for chest pain. Negative for palpitations and leg swelling.  Gastrointestinal:  Positive for abdominal pain. Negative for vomiting.  Genitourinary:  Positive for flank  pain. Negative for dysuria and hematuria.  Musculoskeletal:  Negative for arthralgias and back pain.  Skin:  Negative for color change and rash.  Neurological:  Negative for seizures and syncope.  All other systems reviewed and are negative.   Physical Exam Updated Vital Signs BP 117/75 (BP Location: Left Arm)   Pulse 89   Temp 98.8 F (37.1 C) (Oral)   Resp 17   SpO2 97%  Physical Exam Vitals and nursing note reviewed.  Constitutional:      General: He is not in acute distress.    Appearance: Normal appearance. He is well-developed.  HENT:     Head: Normocephalic and atraumatic.  Eyes:     Conjunctiva/sclera: Conjunctivae normal.     Pupils: Pupils are equal, round, and reactive to light.  Cardiovascular:     Rate and Rhythm: Normal rate and regular rhythm.     Heart sounds: No murmur heard. Pulmonary:     Effort: Pulmonary effort is normal. No respiratory distress.     Breath sounds: Normal breath sounds. No stridor. No wheezing, rhonchi or rales.  Chest:     Chest wall: Tenderness present.  Abdominal:     Palpations: Abdomen is soft.     Tenderness: There is abdominal tenderness. There is no guarding.  Musculoskeletal:        General: No swelling.     Cervical back: Normal range of motion and neck supple.  Skin:    General: Skin is warm and dry.     Capillary Refill: Capillary refill takes less than 2 seconds.  Neurological:     General: No focal deficit present.     Mental Status: He is alert and oriented to person, place, and time.  Psychiatric:        Mood and Affect: Mood normal.     ED Results / Procedures / Treatments   Labs (all labs ordered are listed, but only abnormal results are displayed) Labs Reviewed  COMPREHENSIVE METABOLIC PANEL - Abnormal; Notable for the following components:      Result Value   Sodium 132 (*)    Glucose, Bld 156 (*)    Creatinine, Ser 1.31 (*)    Calcium 8.7 (*)    GFR, Estimated 59 (*)    All other components within  normal limits  CBC WITH DIFFERENTIAL/PLATELET - Abnormal; Notable for the following components:   WBC 22.0 (*)    RBC 3.47 (*)    Hemoglobin 9.5 (*)    HCT 28.9 (*)    RDW 17.3 (*)    Neutro Abs 19.8 (*)    Abs Immature Granulocytes 0.60 (*)    All other components within normal limits  URINALYSIS, ROUTINE W REFLEX MICROSCOPIC - Abnormal; Notable for the following components:   APPearance CLOUDY (*)    Specific Gravity, Urine <1.005 (*)  Hgb urine dipstick SMALL (*)    Protein, ur 100 (*)    Leukocytes,Ua LARGE (*)    Bacteria, UA RARE (*)    All other components within normal limits  URINE CULTURE  LIPASE, BLOOD    EKG None  Radiology CT ABDOMEN PELVIS W CONTRAST  Result Date: 06/27/2023 CLINICAL DATA:  Right flank and right lower quadrant pain. EXAM: CT ABDOMEN AND PELVIS WITH CONTRAST TECHNIQUE: Multidetector CT imaging of the abdomen and pelvis was performed using the standard protocol following bolus administration of intravenous contrast. RADIATION DOSE REDUCTION: This exam was performed according to the departmental dose-optimization program which includes automated exposure control, adjustment of the mA and/or kV according to patient size and/or use of iterative reconstruction technique. CONTRAST:  75mL OMNIPAQUE IOHEXOL 350 MG/ML SOLN COMPARISON:  10/24/2021 FINDINGS: Lower chest: See today's chest CT report Hepatobiliary: No focal hepatic abnormality. Gallbladder unremarkable. Pancreas: No focal abnormality or ductal dilatation. Spleen: No focal abnormality.  Normal size. Adrenals/Urinary Tract: Few scattered hypodensities in kidneys compatible cysts. These are unchanged. No follow-up imaging recommended. No stones or hydronephrosis. Urinary bladder unremarkable. Stomach/Bowel: Normal appendix. Stomach, large and small bowel grossly unremarkable. Vascular/Lymphatic: Diffuse aortoiliac atherosclerosis. Aneurysmal dilatation of the distal abdominal aorta measuring up to 3.6 cm,  stable. No adenopathy. Reproductive: Prostate enlargement. Other: No free fluid or free air. Musculoskeletal: No acute bony abnormality. IMPRESSION: Normal appendix. 3.6 cm abdominal aortic aneurysm. Recommend follow-up ultrasound every 2 years. This recommendation follows ACR consensus guidelines: White Paper of the ACR Incidental Findings Committee II on Vascular Findings. J Am Coll Radiol 2013; 10:789-794. Diffuse aortoiliac atherosclerosis. Prostate enlargement. No acute findings in the abdomen or pelvis. Electronically Signed   By: Charlett Nose M.D.   On: 06/27/2023 20:20   CT Angio Chest PE W/Cm &/Or Wo Cm  Result Date: 06/27/2023 CLINICAL DATA:  Right chest pain, flank pain. Pulmonary embolism (PE) suspected, high prob Right-sided chest wall pain EXAM: CT ANGIOGRAPHY CHEST WITH CONTRAST TECHNIQUE: Multidetector CT imaging of the chest was performed using the standard protocol during bolus administration of intravenous contrast. Multiplanar CT image reconstructions and MIPs were obtained to evaluate the vascular anatomy. RADIATION DOSE REDUCTION: This exam was performed according to the departmental dose-optimization program which includes automated exposure control, adjustment of the mA and/or kV according to patient size and/or use of iterative reconstruction technique. CONTRAST:  75mL OMNIPAQUE IOHEXOL 350 MG/ML SOLN COMPARISON:  None Available. FINDINGS: Cardiovascular: No filling defects in the pulmonary arteries to suggest pulmonary emboli. Heart is normal size. Aorta is normal caliber. Diffuse coronary artery and aortic calcifications. Mediastinum/Nodes: No mediastinal, hilar, or axillary adenopathy. Trachea and esophagus are unremarkable. Thyroid unremarkable. Lungs/Pleura: Advanced emphysema. Calcified nodule in the right upper lobe compatible with granuloma. Airspace disease peripherally in the inferior right upper lobe concerning for pneumonia. Nodular area in the right middle lobe with  surrounding airspace disease. The nodular area on image 114 series 4 measures 1.7 x 1.4 cm. Partially calcified nodule in the right lower lobe measures 11 mm and is stable. Trace right pleural effusion. No confluent airspace opacities or effusions on the left. Upper Abdomen: No acute findings Musculoskeletal: Chest wall soft tissues are unremarkable. No acute bony abnormality. Review of the MIP images confirms the above findings. IMPRESSION: No evidence of pulmonary embolus. Airspace disease in the right middle lobe and right upper lobe concerning for pneumonia. Suspicious nodular area in the right middle lobe measures 1.7 x 1.4 cm. Followup chest CT is recommended in 3-4  weeks following trial of antibiotic therapy to ensure resolution and exclude underlying malignancy. Diffuse coronary artery disease. Aortic Atherosclerosis (ICD10-I70.0) and Emphysema (ICD10-J43.9). Electronically Signed   By: Charlett Nose M.D.   On: 06/27/2023 20:17   DG Ribs Unilateral W/Chest Right  Result Date: 06/27/2023 CLINICAL DATA:  Right-sided rib pain and shortness of breath. EXAM: RIGHT RIBS AND CHEST - 3+ VIEW COMPARISON:  Chest radiograph dated 05/17/2021. FINDINGS: No fracture or other bone lesions are seen involving the ribs. There is no evidence of pneumothorax or pleural effusion. Moderate peripheral airspace opacities are seen in the right mid and lower lung. The left lung is clear. Heart size and mediastinal contours are within normal limits. Degenerative changes are seen in the spine. IMPRESSION: 1. Moderate peripheral airspace opacities in the right mid and lower lung likely reflect pneumonia. Electronically Signed   By: Romona Curls M.D.   On: 06/27/2023 18:15    Procedures Procedures    Medications Ordered in ED Medications  levofloxacin (LEVAQUIN) IVPB 750 mg (0 mg Intravenous Stopped 06/27/23 2031)  iohexol (OMNIPAQUE) 350 MG/ML injection 75 mL (75 mLs Intravenous Contrast Given 06/27/23 1841)    ED Course/  Medical Decision Making/ A&P                                 Medical Decision Making Amount and/or Complexity of Data Reviewed Labs: ordered. Radiology: ordered.  Risk Prescription drug management.   Patient denies any fall or injury.  Chest x-ray with right rib series results still pending but I reviewed it myself.  Raise some suggestion maybe of a pneumonia on the right side.  Patient also has a leukocytosis here.  Temp was 99.1.  Patient's complete metabolic panel sodium 132 GFR 59 LFTs normal blood sugar 156.  Anion gap normal.  CBC white count 22,000 hemoglobin 9.5 not significantly changed.  Platelets 388.  Patient with an increase in absolute neutrophils.  Chest x-ray is back moderate airspace opacities in the right mid and lower lung likely pneumonia.  That goes along clinically what I was seeing.  I had originally ordered CT angio chest to rule out pulmonary embolus and to evaluate the abdominal pain.  Clinically I suspect that this is all probably secondary to the pneumonia.  Not hypoxic here.  Patient may be able to be treated as an outpatient.  Patient treated here with IV Levaquin because of penicillin allergy.  Patient will be continue with p.o. Levaquin at home.  Patient nontoxic currently feeling well.  No hypoxia no tachypnea.  Incidental finding of the abdominal gangrenosum relayed to the patient so that he can have his primary care doctor follow that up.  No acute intervention needed at this time.  CT chest confirmed right middle lobe and right upper lobe pneumonia.  And did find abdominal ache aneurysm that we will need outpatient follow-up with nothing acute.  Urine sent for culture.  Urinalysis consistent with urinary tract infection.  CBC was significant for a white count of 22,000 and seems to be explained by the pneumonia.  Hemoglobin 9.8 platelets were good.  Complete metabolic panel GFR 59 creatinine 1.31 electrolytes normal other than sodium slightly down at 132 and  patient's glucose was 156.  Urinalysis had 11-20 RBCs and greater than 50 whites rare bacteria.  However urine culture sent but needs treatment with Levaquin for the pneumonia anyways and we will most likely treat the UTI as  well.   Final Clinical Impression(s) / ED Diagnoses Final diagnoses:  Right flank pain  Pneumonia of right middle lobe due to infectious organism  Acute cystitis without hematuria    Rx / DC Orders ED Discharge Orders          Ordered    levofloxacin (LEVAQUIN) 750 MG tablet  Daily        06/27/23 2225              Vanetta Mulders, MD 06/27/23 2229

## 2023-06-27 NOTE — ED Notes (Signed)
ED Provider at bedside. 

## 2023-06-27 NOTE — ED Triage Notes (Addendum)
Pt bib ems R sided ribcage/flank pain. Denies any injury. Hx asthma,  Given albuterol en route with improvement in pain. 96% RA.

## 2023-06-29 LAB — URINE CULTURE: Culture: NO GROWTH

## 2023-07-01 ENCOUNTER — Ambulatory Visit (INDEPENDENT_AMBULATORY_CARE_PROVIDER_SITE_OTHER): Payer: Medicare HMO | Admitting: Family Medicine

## 2023-07-01 ENCOUNTER — Encounter: Payer: Self-pay | Admitting: Family Medicine

## 2023-07-01 VITALS — BP 131/73 | HR 86 | Ht 74.0 in | Wt 153.0 lb

## 2023-07-01 DIAGNOSIS — I739 Peripheral vascular disease, unspecified: Secondary | ICD-10-CM

## 2023-07-01 DIAGNOSIS — E785 Hyperlipidemia, unspecified: Secondary | ICD-10-CM

## 2023-07-01 DIAGNOSIS — I7141 Pararenal abdominal aortic aneurysm, without rupture: Secondary | ICD-10-CM

## 2023-07-01 DIAGNOSIS — Z09 Encounter for follow-up examination after completed treatment for conditions other than malignant neoplasm: Secondary | ICD-10-CM

## 2023-07-01 DIAGNOSIS — R911 Solitary pulmonary nodule: Secondary | ICD-10-CM

## 2023-07-01 DIAGNOSIS — J189 Pneumonia, unspecified organism: Secondary | ICD-10-CM | POA: Diagnosis not present

## 2023-07-01 DIAGNOSIS — I10 Essential (primary) hypertension: Secondary | ICD-10-CM

## 2023-07-01 DIAGNOSIS — R042 Hemoptysis: Secondary | ICD-10-CM

## 2023-07-01 DIAGNOSIS — F17200 Nicotine dependence, unspecified, uncomplicated: Secondary | ICD-10-CM

## 2023-07-01 NOTE — Assessment & Plan Note (Signed)
Reported as appearing "suspicious," refer pulmonary esp with recent h/o hemop[tysis, states 2nd episode

## 2023-07-01 NOTE — Patient Instructions (Signed)
Annual exam as scheduled, call if you need me sooner.Please bring all of the medications that you take to this visit  Please change address at checkout  Nonfasting CBC and Chem-7 and EGFR 07/12/2023.  You are referred for a repeat chest scan in 4 weeks, and also to see the pulmonary specialist.  Follow-up with the vascular disease specialist will also be arranged   Please set today as a day you quit smoking entirely especially based on what we discussed at the visit.

## 2023-07-01 NOTE — Assessment & Plan Note (Addendum)
Patient in for follow up of recent Ed eval Discharge summary, and laboratory and radiology data are reviewed, and any questions or concerns  are discussed. Specific issues requiring follow up are specifically addressed.

## 2023-07-01 NOTE — Assessment & Plan Note (Addendum)
Rept scan in 4 weeks, clinically improved, cbc and diff in 1 week

## 2023-07-01 NOTE — Progress Notes (Signed)
Dillon [name]     MRN: [id number]      DOB: [birth year]  Chief Complaint  Patient presents with   Follow-up    ER follow up    HPI Dillon [name] is here for follow up of ed visit on 8/31 when he was dx with right pneumonia, new suspicious right lung nodule, abdominal aortic aneurysm requiring follow-up, BPH.  Patient reports he feels much better than he did last week Saturday 5 days ago when he presented with acute right-sided chest pain and cough fever and chills.and leukocytosis  He denies ever producing sputum but said he coughed up bright red blood about the size of quarter without excessive bronchial irritation.  He states this has happened before.  He still smokes and has been trying to quit.  He has been lost to follow-up with urology per his report and his most recent PSA was again increased. ROS See HPI  Denies abdominal pain, nausea, vomiting,diarrhea or constipation.   Denies dysuria, frequency, hesitancy or incontinence. Denies joint pain, swelling and limitation in mobility. Denies headaches, seizures, numbness, or tingling. Denies depression, anxiety or insomnia. Denies skin break down or rash.   PE  BP 131/73 (BP Location: Right Arm, Patient Position: Sitting, Cuff Size: Normal)   Pulse 86   Ht 6\' 2"  (1.88 m)   Wt 153 lb 0.6 oz (69.4 kg)   SpO2 98%   BMI 19.65 kg/m   Patient alert and oriented and in no cardiopulmonary distress.  HEENT: No facial asymmetry, EOMI,     Neck supple .  Chest: decreased air entry throughout, most marked on right with bronchial breath sounds  CVS: S1, S2 no murmurs, no S3.Regular rate.  ABD: Soft non tender.   Ext: No edema  MS: Adequate ROM spine, shoulders, hips and knees.  Skin: Intact, no ulcerations or rash noted.  Psych: Good eye contact, normal affect. Memory intact not anxious or depressed appearing.  CNS: CN 2-12 intact, power,  normal throughout.no focal deficits noted.   Assessment & Plan  Pneumonia Rept  scan in 4 weeks, clinically improved, cbc and diff in 1 week  Nodule of middle lobe of right lung Reported as appearing "suspicious," refer pulmonary esp with recent h/o hemop[tysis, states 2nd episode  Encounter for examination following treatment at hospital Patient in for follow up of recent Ed eval Discharge summary, and laboratory and radiology data are reviewed, and any questions or concerns  are discussed. Specific issues requiring follow up are specifically addressed.   Essential hypertension DASH diet and commitment to daily physical activity for a minimum of 30 minutes discussed and encouraged, as a part of hypertension management. The importance of attaining a healthy weight is also discussed. Controlled, no change in medication      07/01/2023    8:33 AM 06/27/2023   10:34 PM 06/27/2023    7:46 PM 06/27/2023    4:27 PM 06/24/2023    8:01 AM 06/03/2023    8:23 AM 05/25/2023    2:58 PM  BP/Weight  Systolic BP 131 131 117 139 147 149 --  Diastolic BP 73 78 75 86 77 75 --  Wt. (Lbs) 153.04    151  161  BMI 19.65 kg/m2    19.39 kg/m2  20.95 kg/m2       Hyperlipidemia LDL goal <70 Hyperlipidemia:Low fat diet discussed and encouraged.   Lipid Panel  Lab Results  Component Value Date   CHOL 213 (H) 05/13/2023   HDL 97  05/13/2023   LDLCALC 101 (H) 05/13/2023   TRIG 66 05/13/2023   CHOLHDL 2.2 05/13/2023     Uncontrolled needs to reduce fat in diet  NICOTINE ADDICTION Asked:confirms currently smokes cigarettes Assess: Unwilling to set a quit date, but is cutting back and wants to quit Advise: needs to QUIT to reduce risk of cancer, cardio and cerebrovascular disease Assist: counseled for 5 minutes and literature provided Arrange: follow up in 2 to 4 months   Abdominal aortic aneurysm (AAA) without rupture (HCC) Neefds to be followed by vascular rept t test recommended in 2 years , neds to quit smoking

## 2023-07-06 ENCOUNTER — Telehealth: Payer: Self-pay

## 2023-07-06 MED ORDER — HYDROCODONE-ACETAMINOPHEN 7.5-325 MG PO TABS: ORAL_TABLET | ORAL | 0 refills | Status: DC

## 2023-07-06 NOTE — Telephone Encounter (Signed)
Hydrocodone-Acetaminophen 7.5/325 MG   Qty 110 Tablets  PATIENT USES [facility] [address]

## 2023-07-08 ENCOUNTER — Encounter: Payer: Medicare HMO | Admitting: Family Medicine

## 2023-07-08 NOTE — Assessment & Plan Note (Addendum)
DASH diet and commitment to daily physical activity for a minimum of 30 minutes discussed and encouraged, as a part of hypertension management. The importance of attaining a healthy weight is also discussed. Controlled, no change in medication      07/01/2023    8:33 AM 06/27/2023   10:34 PM 06/27/2023    7:46 PM 06/27/2023    4:27 PM 06/24/2023    8:01 AM 06/03/2023    8:23 AM 05/25/2023    2:58 PM  BP/Weight  Systolic BP 131 131 117 139 147 149 --  Diastolic BP 73 78 75 86 77 75 --  Wt. (Lbs) 153.04    151  161  BMI 19.65 kg/m2    19.39 kg/m2  20.95 kg/m2

## 2023-07-08 NOTE — Assessment & Plan Note (Signed)
Neefds to be followed by vascular rept t test recommended in 2 years , neds to quit smoking

## 2023-07-08 NOTE — Assessment & Plan Note (Signed)
Asked:confirms currently smokes cigarettes ?Assess: Unwilling to set a quit date, but is cutting back and wants to quit ?Advise: needs to QUIT to reduce risk of cancer, cardio and cerebrovascular disease ?Assist: counseled for 5 minutes and literature provided ?Arrange: follow up in 2 to 4 months ? ?

## 2023-07-08 NOTE — Assessment & Plan Note (Signed)
Hyperlipidemia:Low fat diet discussed and encouraged.   Lipid Panel  Lab Results  Component Value Date   CHOL 213 (H) 05/13/2023   HDL 97 05/13/2023   LDLCALC 101 (H) 05/13/2023   TRIG 66 05/13/2023   CHOLHDL 2.2 05/13/2023     Uncontrolled needs to reduce fat in diet

## 2023-07-10 ENCOUNTER — Telehealth: Payer: Self-pay

## 2023-07-10 NOTE — Telephone Encounter (Signed)
Transition Care Management Unsuccessful Follow-up Telephone Call  Date of discharge and from where:  [name] 8/31  Attempts:  1st Attempt  Reason for unsuccessful TCM follow-up call:  No answer/busy   [name]  [facility], [facility] Guide, Phone: [phone] Website: [email or web address]

## 2023-07-13 ENCOUNTER — Telehealth: Payer: Self-pay

## 2023-07-13 DIAGNOSIS — D539 Nutritional anemia, unspecified: Secondary | ICD-10-CM | POA: Diagnosis not present

## 2023-07-13 DIAGNOSIS — I10 Essential (primary) hypertension: Secondary | ICD-10-CM | POA: Diagnosis not present

## 2023-07-13 NOTE — Telephone Encounter (Signed)
Transition Care Management Unsuccessful Follow-up Telephone Call  Date of discharge and from where:  [place] 8/31  Attempts:  2nd Attempt  Reason for unsuccessful TCM follow-up call:  No answer/busy   Dillon [name] [place]  [facility], [facility] Guide, Phone: [phone] Website: [email or web address]

## 2023-07-14 LAB — CBC
Hematocrit: 28.6 % — ABNORMAL LOW (ref 37.5–51.0)
Hemoglobin: 9.1 g/dL — ABNORMAL LOW (ref 13.0–17.7)
MCH: 25.7 pg — ABNORMAL LOW (ref 26.6–33.0)
MCHC: 31.8 g/dL (ref 31.5–35.7)
MCV: 81 fL (ref 79–97)
Platelets: 638 10*3/uL — ABNORMAL HIGH (ref 150–450)
RBC: 3.54 x10E6/uL — ABNORMAL LOW (ref 4.14–5.80)
RDW: 15.7 % — ABNORMAL HIGH (ref 11.6–15.4)
WBC: 4.6 10*3/uL (ref 3.4–10.8)

## 2023-07-14 LAB — BMP8+EGFR
BUN/Creatinine Ratio: 10 (ref 10–24)
BUN: 10 mg/dL (ref 8–27)
CO2: 22 mmol/L (ref 20–29)
Calcium: 9.7 mg/dL (ref 8.6–10.2)
Chloride: 99 mmol/L (ref 96–106)
Creatinine, Ser: 1 mg/dL (ref 0.76–1.27)
Glucose: 90 mg/dL (ref 70–99)
Potassium: 4.6 mmol/L (ref 3.5–5.2)
Sodium: 137 mmol/L (ref 134–144)
eGFR: 81 mL/min/{1.73_m2} (ref >59)

## 2023-07-15 ENCOUNTER — Ambulatory Visit (INDEPENDENT_AMBULATORY_CARE_PROVIDER_SITE_OTHER): Payer: Medicare HMO | Admitting: Family Medicine

## 2023-07-15 ENCOUNTER — Ambulatory Visit (INDEPENDENT_AMBULATORY_CARE_PROVIDER_SITE_OTHER): Payer: Medicare HMO

## 2023-07-15 ENCOUNTER — Encounter: Payer: Self-pay | Admitting: Orthopaedic Surgery

## 2023-07-15 ENCOUNTER — Encounter: Payer: Self-pay | Admitting: Family Medicine

## 2023-07-15 ENCOUNTER — Ambulatory Visit (INDEPENDENT_AMBULATORY_CARE_PROVIDER_SITE_OTHER): Payer: Medicare HMO | Admitting: Orthopaedic Surgery

## 2023-07-15 VITALS — BP 124/79 | HR 101 | Ht 74.0 in | Wt 152.0 lb

## 2023-07-15 VITALS — BP 120/69 | HR 101 | Ht 74.0 in | Wt 153.0 lb

## 2023-07-15 DIAGNOSIS — D539 Nutritional anemia, unspecified: Secondary | ICD-10-CM

## 2023-07-15 DIAGNOSIS — Z0001 Encounter for general adult medical examination with abnormal findings: Secondary | ICD-10-CM | POA: Diagnosis not present

## 2023-07-15 DIAGNOSIS — D509 Iron deficiency anemia, unspecified: Secondary | ICD-10-CM

## 2023-07-15 DIAGNOSIS — R911 Solitary pulmonary nodule: Secondary | ICD-10-CM

## 2023-07-15 DIAGNOSIS — S62610D Displaced fracture of proximal phalanx of right index finger, subsequent encounter for fracture with routine healing: Secondary | ICD-10-CM

## 2023-07-15 DIAGNOSIS — M79641 Pain in right hand: Secondary | ICD-10-CM

## 2023-07-15 NOTE — Patient Instructions (Addendum)
Annual exam week of Oct 8 ,call if you needme sooner  Pls change address at checkout  You are referred to gI re anemia  Nurse pls add iron, ferritin , B12 and folate level to recent lab, dx is anemia  Ensure you  get your scan    Make the last cigarette you recently smoked , BE  THE LAST CIGARETTE  Thanks for choosing [facility], we consider it a privelige to serve you.

## 2023-07-15 NOTE — Progress Notes (Signed)
I still have some swelling.  He has tenderness of the right index finger MCP joint with swelling.  He has no new trauma.  Motion is better.  He has full motion of the right index finger but it is tender and swollen at the MCP joint.  NV intact.  X-rays were done of the right hand, reported separately.  Encounter Diagnosis  Name Primary?   Right hand pain Yes   I will see him in one month.  Use Aspercreme, Biofreeze or Voltaren gel to the affected area.  X-rays on return.  Call if any problem.  Precautions discussed.  Electronically Signed Darreld Mclean, MD 9/18/20248:27 AM

## 2023-07-17 ENCOUNTER — Encounter: Payer: Self-pay | Admitting: Family Medicine

## 2023-07-17 DIAGNOSIS — Z0001 Encounter for general adult medical examination with abnormal findings: Secondary | ICD-10-CM | POA: Insufficient documentation

## 2023-07-17 DIAGNOSIS — D539 Nutritional anemia, unspecified: Secondary | ICD-10-CM | POA: Insufficient documentation

## 2023-07-17 NOTE — Assessment & Plan Note (Signed)
B12, iron , ferritin and folate added to lab

## 2023-07-17 NOTE — Assessment & Plan Note (Signed)

## 2023-07-17 NOTE — Assessment & Plan Note (Signed)
Rept scan scheduled , pt aware of importance of getting this done

## 2023-07-17 NOTE — Progress Notes (Signed)
Dillon [name]     MRN: [id number]      DOB: 07-25-1951  Chief Complaint  Patient presents with   Annual Exam    CPE aneurysm found at er visit     HPI: Patient is in for annual physical exam.  Immunization is reviewed , and  updated if needed.    PE; BP 120/69 (BP Location: Right Arm, Patient Position: Sitting, Cuff Size: Large)   Pulse (!) 101   Ht 6\' 2"  (1.88 m)   Wt 153 lb (69.4 kg)   SpO2 96%   BMI 19.64 kg/m   Pleasant male, alert and oriented x 3, in no cardio-pulmonary distress. Afebrile. HEENT No facial trauma or asymetry. Sinuses non tender. EOMI External ears normal,  Neck: supple, no adenopathy,JVD or thyromegaly.No bruits.  Chest: .No crackles or wheezes.Decreased air entry bronchial breath sounds right middle upper lobes Non tender to palpation  Cardiovascular system; Heart sounds normal,  S1 and  S2 ,no S3.  No murmur, or thrill. Apical beat not displaced Peripheral pulses normal.  Abdomen: Soft, non tender,Bowel sounds normal. No guarding, tenderness or rebound.    Musculoskeletal exam: Decreased  ROM of spine, hips , shoulders and knees. No deformity ,swelling or crepitus noted. No muscle wasting or atrophy.   Neurologic: Cranial nerves 2 to 12 intact. Power, tone ,sensation normal throughout. No disturbance in gait. No tremor.  Skin: Intact, no ulceration, erythema , scaling or rash noted. Pigmentation normal throughout  Psych; Normal mood and affect. Judgement and concentration normal   Assessment & Plan:  Encounter for Medicare annual examination with abnormal findings Annual exam as documented. Counseling done  re healthy lifestyle involving commitment to 150 minutes exercise per week, heart healthy diet, and attaining healthy weight.The importance of adequate sleep also discussed. Regular seat belt use and home safety, is also discussed. Changes in health habits are decided on by the patient with goals and time frames  set  for achieving them. Immunization and cancer screening needs are specifically addressed at this visit.   Nodule of middle lobe of right lung Rept scan scheduled , pt aware of importance of getting this done

## 2023-07-18 LAB — FERRITIN: Ferritin: 37 ng/mL (ref 30–400)

## 2023-07-18 LAB — SPECIMEN STATUS REPORT

## 2023-07-18 LAB — IRON: Iron: 12 ug/dL — ABNORMAL LOW (ref 38–169)

## 2023-07-18 LAB — FOLATE: Folate: 7.2 ng/mL (ref >3.0)

## 2023-07-18 LAB — VITAMIN B12: Vitamin B-12: 434 pg/mL (ref 232–1245)

## 2023-07-22 NOTE — Addendum Note (Signed)
Addended by: Kerri Perches on: 07/22/2023 12:14 AM   Modules accepted: Orders

## 2023-07-23 ENCOUNTER — Encounter: Payer: Self-pay | Admitting: *Deleted

## 2023-07-25 ENCOUNTER — Encounter: Payer: Self-pay | Admitting: Gastroenterology

## 2023-07-25 NOTE — Progress Notes (Deleted)
Referring Provider: Kerri Perches, MD Primary Care Physician:  Kerri Perches, MD Primary GI Physician: Dr. Bonnetta Barry chief complaint on file.   HPI:   Dillon [name] is a 72 y.o. male presenting today at the request of Lodema Hong Milus Mallick, MD for IDA.   Per chart review, patient developed anemia in July 2022 with hemoglobin 12.9.  Hemoglobin declined to 11.2 in April 2024 and down to 9.5 in August 2024.  Most recent labs 07/13/2023 with hemoglobin 9.1.  Also with low iron.  B12 and folate within normal limits.  Last colonoscopy 11/04/21 with nonbleeding internal hemorrhoids, three 6-8 mm polyps removed from the descending colon to 3 to 4 mm polyps removed from the rectum, but tissue not retrieved.  Pathology with 2 tubular adenomas and 1 fragment of benign colonic mucosa.  Recommended 5-year surveillance.  No prior EGD  CT angio chest 06/27/2023 with 1.7 x 1.4 cm right middle lobe nodule.  PCP has ordered follow-up CT to be completed 10/4.  CT abdomen pelvis with contrast 06/27/2023 with no acute findings.  He had prostate enlargement, diffuse aortoiliac atherosclerosis, and 3.6 cm abdominal aortic aneurysm.   Today:    Past Medical History:  Diagnosis Date   Arthritis    2012   Asthma    childhood   COPD (chronic obstructive pulmonary disease) (HCC)    Glaucoma    Hyperlipidemia    1990   Hypertension    1990   Nicotine addiction    PAD (peripheral artery disease) (HCC) 2015    Past Surgical History:  Procedure Laterality Date   COLONOSCOPY WITH PROPOFOL N/A 11/04/2021   Procedure: COLONOSCOPY WITH PROPOFOL;  Surgeon: Lanelle Bal, DO;  [facility];  Service: Endoscopy;  Laterality: N/A;  9:30am   KNEE ARTHROSCOPY     both knees    LOWER EXTREMITY ANGIOGRAPHY N/A 02/04/2018   Procedure: LOWER EXTREMITY ANGIOGRAPHY;  Surgeon: Fransisco Hertz, MD;  Location: [facility] INVASIVE CV LAB;  Service: Cardiovascular;  Laterality: N/A;   POLYPECTOMY  11/04/2021    Procedure: POLYPECTOMY;  Surgeon: Lanelle Bal, DO;  Location: [facility];  Service: Endoscopy;;    Current Outpatient Medications  Medication Sig Dispense Refill   amLODipine (NORVASC) 10 MG tablet TAKE 1 TABLET BY MOUTH EVERY DAY 90 tablet 1   aspirin EC 81 MG tablet Take 81 mg by mouth daily.     atorvastatin (LIPITOR) 80 MG tablet TAKE 1 TABLET BY MOUTH EVERY DAY 90 tablet 3   cilostazol (PLETAL) 100 MG tablet Take 1 tablet (100 mg total) by mouth 2 (two) times daily before a meal. 60 tablet 11   fluticasone furoate-vilanterol (BREO ELLIPTA) 200-25 MCG/ACT AEPB Inhale 1 puff into the lungs daily.     HYDROcodone-acetaminophen (NORCO) 7.5-325 MG tablet One tablet every six hours as need for pain.  Must last 28 days. 110 tablet 0   latanoprost (XALATAN) 0.005 % ophthalmic solution 1 drop daily.     rosuvastatin (CRESTOR) 40 MG tablet Take 1 tablet (40 mg total) by mouth daily. 90 tablet 3   spironolactone (ALDACTONE) 50 MG tablet TAKE 1 TABLET BY MOUTH EVERY DAY 90 tablet 1   UNABLE TO FIND Nebulizer facemask and tubing x 1  DX J44.9 1 each 0   No current facility-administered medications for this visit.    Allergies as of 07/27/2023 - Review Complete 07/17/2023  Allergen Reaction Noted   Penicillins Hives and Other (See  Comments) 02/24/2008    Family History  Problem Relation Age of Onset   Heart attack Mother    Diabetes Mother    Hypertension Mother    Stroke Brother    Hypertension Sister    Hypertension Sister    Hypertension Sister    Hypertension Brother    Colon cancer Neg Hx     Social History   Socioeconomic History   Marital status: Single    Spouse name: Not on file   Number of children: Not on file   Years of education: Not on file   Highest education level: Not on file  Occupational History   Not on file  Tobacco Use   Smoking status: Every Day    Current packs/day: 0.25    Average packs/day: 0.3 packs/day for 53.8 years (13.4 ttl pk-yrs)     Types: Cigarettes    Start date: 10/16/1969    Passive exposure: Never   Smokeless tobacco: Never  Vaping Use   Vaping status: Never Used  Substance and Sexual Activity   Alcohol use: Yes    Comment: occassional, max of 72 oz pwr month   Drug use: No   Sexual activity: Yes    Partners: Female  Other Topics Concern   Not on file  Social History Narrative   Not on file   Social Determinants of Health   Financial Resource Strain: Low Risk  (05/25/2023)   Overall Financial Resource Strain (CARDIA)    Difficulty of Paying Living Expenses: Not hard at all  Food Insecurity: No Food Insecurity (05/25/2023)   Hunger Vital Sign    Worried About Running Out of Food in the Last Year: Never true    Ran Out of Food in the Last Year: Never true  Transportation Needs: No Transportation Needs (05/25/2023)   PRAPARE - Administrator, Civil Service (Medical): No    Lack of Transportation (Non-Medical): No  Physical Activity: Sufficiently Active (05/25/2023)   Exercise Vital Sign    Days of Exercise per Week: 7 days    Minutes of Exercise per Session: 30 min  Stress: No Stress Concern Present (05/25/2023)   [employer] of Occupational Health - Occupational Stress Questionnaire    Feeling of Stress : Not at all  Social Connections: Moderately Isolated (05/25/2023)   Social Connection and Isolation Panel [NHANES]    Frequency of Communication with Friends and Family: More than three times a week    Frequency of Social Gatherings with Friends and Family: More than three times a week    Attends Religious Services: 1 to 4 times per year    Active Member of [employer] or Organizations: No    Attends Banker Meetings: Never    Marital Status: Never married    Review of Systems: Gen: Denies fever, chills, anorexia. Denies fatigue, weakness, weight loss.  CV: Denies chest pain, palpitations, syncope, peripheral edema, and claudication. Resp: Denies dyspnea at rest, cough,  wheezing, coughing up blood, and pleurisy. GI: Denies vomiting blood, jaundice, and fecal incontinence.   Denies dysphagia or odynophagia. Derm: Denies rash, itching, dry skin Psych: Denies depression, anxiety, memory loss, confusion. No homicidal or suicidal ideation.  Heme: Denies bruising, bleeding, and enlarged lymph nodes.  Physical Exam: There were no vitals taken for this visit. General:   Alert and oriented. No distress noted. Pleasant and cooperative.  Head:  Normocephalic and atraumatic. Eyes:  Conjuctiva clear without scleral icterus. Heart:  S1, S2 present without murmurs appreciated.  Lungs:  Clear to auscultation bilaterally. No wheezes, rales, or rhonchi. No distress.  Abdomen:  +BS, soft, non-tender and non-distended. No rebound or guarding. No HSM or masses noted. Msk:  Symmetrical without gross deformities. Normal posture. Extremities:  Without edema. Neurologic:  Alert and  oriented x4 Psych:  Normal mood and affect.    Assessment:     Plan:  ***   Ermalinda Memos, PA-C [facility] 07/27/2023

## 2023-07-27 ENCOUNTER — Ambulatory Visit: Payer: Medicare HMO | Admitting: Gastroenterology

## 2023-07-27 ENCOUNTER — Encounter (INDEPENDENT_AMBULATORY_CARE_PROVIDER_SITE_OTHER): Payer: Self-pay

## 2023-07-29 DIAGNOSIS — H52223 Regular astigmatism, bilateral: Secondary | ICD-10-CM | POA: Diagnosis not present

## 2023-07-29 DIAGNOSIS — H5202 Hypermetropia, left eye: Secondary | ICD-10-CM | POA: Diagnosis not present

## 2023-07-29 DIAGNOSIS — H401113 Primary open-angle glaucoma, right eye, severe stage: Secondary | ICD-10-CM | POA: Diagnosis not present

## 2023-07-29 DIAGNOSIS — H401121 Primary open-angle glaucoma, left eye, mild stage: Secondary | ICD-10-CM | POA: Diagnosis not present

## 2023-07-29 DIAGNOSIS — H2513 Age-related nuclear cataract, bilateral: Secondary | ICD-10-CM | POA: Diagnosis not present

## 2023-07-29 DIAGNOSIS — H5211 Myopia, right eye: Secondary | ICD-10-CM | POA: Diagnosis not present

## 2023-07-31 ENCOUNTER — Ambulatory Visit (HOSPITAL_COMMUNITY)
Admission: RE | Admit: 2023-07-31 | Discharge: 2023-07-31 | Disposition: A | Discharge status: Home / Self Care | Payer: Medicare HMO | Source: Ambulatory Visit | Attending: Family Medicine | Admitting: Family Medicine

## 2023-07-31 ENCOUNTER — Other Ambulatory Visit (HOSPITAL_COMMUNITY): Payer: Medicare HMO

## 2023-07-31 DIAGNOSIS — J439 Emphysema, unspecified: Secondary | ICD-10-CM | POA: Diagnosis not present

## 2023-07-31 DIAGNOSIS — R911 Solitary pulmonary nodule: Secondary | ICD-10-CM | POA: Insufficient documentation

## 2023-07-31 DIAGNOSIS — I700 Atherosclerosis of aorta: Secondary | ICD-10-CM | POA: Diagnosis not present

## 2023-07-31 DIAGNOSIS — R918 Other nonspecific abnormal finding of lung field: Secondary | ICD-10-CM | POA: Diagnosis not present

## 2023-08-03 ENCOUNTER — Telehealth: Payer: Self-pay

## 2023-08-03 MED ORDER — HYDROCODONE-ACETAMINOPHEN 7.5-325 MG PO TABS: ORAL_TABLET | ORAL | 0 refills | Status: DC

## 2023-08-03 NOTE — Telephone Encounter (Signed)
Hydrocodone-Acetaminophen 7.5/325 MG Qty 110 Tablets  PATIENT USES [facility]

## 2023-08-08 NOTE — Progress Notes (Deleted)
Referring Provider: Kerri Perches, MD Primary Care Physician:  Kerri Perches, MD Primary GI Physician: Dr. Marletta Lor  No chief complaint on file.   HPI:   Dillon [name] is a 72 y.o. male presenting today at the request of Lodema Hong Milus Mallick, MD for IDA.    Per chart review, patient developed anemia in July 2022 with hemoglobin 12.9.  Hemoglobin declined to 11.2 in April 2024 and down to 9.5 in August 2024.  Most recent labs 07/13/2023 with hemoglobin 9.1.  Also with low iron.  B12 and folate within normal limits.   Last colonoscopy 11/04/21 with nonbleeding internal hemorrhoids, three 6-8 mm polyps removed from the descending colon to 3 to 4 mm polyps removed from the rectum, but tissue not retrieved.  Pathology with 2 tubular adenomas and 1 fragment of benign colonic mucosa.  Recommended 5-year surveillance.   No prior EGD   CT angio chest 06/27/2023 with 1.7 x 1.4 cm right middle lobe nodule.  PCP has ordered follow-up CT to be completed 10/4.   CT abdomen pelvis with contrast 06/27/2023 with no acute findings.  He had prostate enlargement, diffuse aortoiliac atherosclerosis, and 3.6 cm abdominal aortic aneurysm.     Today:      Past Medical History:  Diagnosis Date   Arthritis    2012   Asthma    childhood   Carotid artery stenosis    COPD (chronic obstructive pulmonary disease) (HCC)    Glaucoma    Hyperlipidemia    1990   Hypertension    1990   Nicotine addiction    PAD (peripheral artery disease) (HCC) 2015    Past Surgical History:  Procedure Laterality Date   COLONOSCOPY WITH PROPOFOL N/A 11/04/2021   Procedure: COLONOSCOPY WITH PROPOFOL;  Surgeon: Lanelle Bal, DO;  [facility];  Service: Endoscopy;  Laterality: N/A;  9:30am   KNEE ARTHROSCOPY     both knees    LOWER EXTREMITY ANGIOGRAPHY N/A 02/04/2018   Procedure: LOWER EXTREMITY ANGIOGRAPHY;  Surgeon: Fransisco Hertz, MD;  Location: [facility] INVASIVE CV LAB;  Service: Cardiovascular;   Laterality: N/A;   POLYPECTOMY  11/04/2021   Procedure: POLYPECTOMY;  Surgeon: Lanelle Bal, DO;  [facility];  Service: Endoscopy;;    Current Outpatient Medications  Medication Sig Dispense Refill   amLODipine (NORVASC) 10 MG tablet TAKE 1 TABLET BY MOUTH EVERY DAY 90 tablet 1   aspirin EC 81 MG tablet Take 81 mg by mouth daily.     atorvastatin (LIPITOR) 80 MG tablet TAKE 1 TABLET BY MOUTH EVERY DAY 90 tablet 3   cilostazol (PLETAL) 100 MG tablet Take 1 tablet (100 mg total) by mouth 2 (two) times daily before a meal. 60 tablet 11   fluticasone furoate-vilanterol (BREO ELLIPTA) 200-25 MCG/ACT AEPB Inhale 1 puff into the lungs daily.     HYDROcodone-acetaminophen (NORCO) 7.5-325 MG tablet One tablet every six hours as need for pain.  Must last 28 days. 110 tablet 0   latanoprost (XALATAN) 0.005 % ophthalmic solution 1 drop daily.     rosuvastatin (CRESTOR) 40 MG tablet Take 1 tablet (40 mg total) by mouth daily. 90 tablet 3   spironolactone (ALDACTONE) 50 MG tablet TAKE 1 TABLET BY MOUTH EVERY DAY 90 tablet 1   UNABLE TO FIND Nebulizer facemask and tubing x 1  DX J44.9 1 each 0   No current facility-administered medications for this visit.    Allergies as of 08/12/2023 -  Review Complete 07/17/2023  Allergen Reaction Noted   Penicillins Hives and Other (See Comments) 02/24/2008    Family History  Problem Relation Age of Onset   Heart attack Mother    Diabetes Mother    Hypertension Mother    Stroke Brother    Hypertension Sister    Hypertension Sister    Hypertension Sister    Hypertension Brother    Colon cancer Neg Hx     Social History   Socioeconomic History   Marital status: Single    Spouse name: Not on file   Number of children: Not on file   Years of education: Not on file   Highest education level: Not on file  Occupational History   Not on file  Tobacco Use   Smoking status: Every Day    Current packs/day: 0.25    Average packs/day: 0.3  packs/day for 53.8 years (13.5 ttl pk-yrs)    Types: Cigarettes    Start date: 10/16/1969    Passive exposure: Never   Smokeless tobacco: Never  Vaping Use   Vaping status: Never Used  Substance and Sexual Activity   Alcohol use: Yes    Comment: occassional, max of 72 oz pwr month   Drug use: No   Sexual activity: Yes    Partners: Female  Other Topics Concern   Not on file  Social History Narrative   Not on file   Social Determinants of Health   Financial Resource Strain: Low Risk  (05/25/2023)   Overall Financial Resource Strain (CARDIA)    Difficulty of Paying Living Expenses: Not hard at all  Food Insecurity: No Food Insecurity (05/25/2023)   Hunger Vital Sign    Worried About Running Out of Food in the Last Year: Never true    Ran Out of Food in the Last Year: Never true  Transportation Needs: No Transportation Needs (05/25/2023)   PRAPARE - Administrator, Civil Service (Medical): No    Lack of Transportation (Non-Medical): No  Physical Activity: Sufficiently Active (05/25/2023)   Exercise Vital Sign    Days of Exercise per Week: 7 days    Minutes of Exercise per Session: 30 min  Stress: No Stress Concern Present (05/25/2023)   [employer] of Occupational Health - Occupational Stress Questionnaire    Feeling of Stress : Not at all  Social Connections: Moderately Isolated (05/25/2023)   Social Connection and Isolation Panel [NHANES]    Frequency of Communication with Friends and Family: More than three times a week    Frequency of Social Gatherings with Friends and Family: More than three times a week    Attends Religious Services: 1 to 4 times per year    Active Member of [employer] or Organizations: No    Attends Banker Meetings: Never    Marital Status: Never married    Review of Systems: Gen: Denies fever, chills, anorexia. Denies fatigue, weakness, weight loss.  CV: Denies chest pain, palpitations, syncope, peripheral edema, and  claudication. Resp: Denies dyspnea at rest, cough, wheezing, coughing up blood, and pleurisy. GI: Denies vomiting blood, jaundice, and fecal incontinence.   Denies dysphagia or odynophagia. Derm: Denies rash, itching, dry skin Psych: Denies depression, anxiety, memory loss, confusion. No homicidal or suicidal ideation.  Heme: Denies bruising, bleeding, and enlarged lymph nodes.  Physical Exam: There were no vitals taken for this visit. General:   Alert and oriented. No distress noted. Pleasant and cooperative.  Head:  Normocephalic and atraumatic. Eyes:  Conjuctiva clear without scleral icterus. Heart:  S1, S2 present without murmurs appreciated. Lungs:  Clear to auscultation bilaterally. No wheezes, rales, or rhonchi. No distress.  Abdomen:  +BS, soft, non-tender and non-distended. No rebound or guarding. No HSM or masses noted. Msk:  Symmetrical without gross deformities. Normal posture. Extremities:  Without edema. Neurologic:  Alert and  oriented x4 Psych:  Normal mood and affect.    Assessment:     Plan:  ***   Ermalinda Memos, PA-C [facility] 08/12/2023

## 2023-08-12 ENCOUNTER — Ambulatory Visit: Payer: Medicare HMO | Admitting: Gastroenterology

## 2023-08-12 ENCOUNTER — Encounter: Payer: Self-pay | Admitting: Orthopaedic Surgery

## 2023-08-12 ENCOUNTER — Ambulatory Visit (INDEPENDENT_AMBULATORY_CARE_PROVIDER_SITE_OTHER): Payer: Medicare HMO | Admitting: Orthopaedic Surgery

## 2023-08-12 ENCOUNTER — Encounter: Payer: Self-pay | Admitting: Gastroenterology

## 2023-08-12 DIAGNOSIS — S62610D Displaced fracture of proximal phalanx of right index finger, subsequent encounter for fracture with routine healing: Secondary | ICD-10-CM | POA: Diagnosis not present

## 2023-08-12 NOTE — Progress Notes (Signed)
My hand is better.  His right hand has no swelling today over the second metacarpal head area.  He has full ROM.  He has no new trauma.    Encounter Diagnosis  Name Primary?   Closed displaced fracture of proximal phalanx of right index finger with routine healing, subsequent encounter Yes   This has healed.  I will see him in three months for his chronic back pain.  Call if any problem.  Precautions discussed.  Electronically Signed Darreld Mclean, MD 10/16/20248:38 AM

## 2023-08-17 ENCOUNTER — Encounter: Payer: Self-pay | Admitting: Gastroenterology

## 2023-08-17 ENCOUNTER — Ambulatory Visit (INDEPENDENT_AMBULATORY_CARE_PROVIDER_SITE_OTHER): Payer: Medicare HMO | Admitting: Gastroenterology

## 2023-08-17 ENCOUNTER — Telehealth: Payer: Self-pay | Admitting: *Deleted

## 2023-08-17 VITALS — BP 137/75 | HR 85 | Temp 98.6°F | Ht 74.5 in | Wt 153.6 lb

## 2023-08-17 DIAGNOSIS — F17210 Nicotine dependence, cigarettes, uncomplicated: Secondary | ICD-10-CM | POA: Diagnosis not present

## 2023-08-17 DIAGNOSIS — D509 Iron deficiency anemia, unspecified: Secondary | ICD-10-CM

## 2023-08-17 DIAGNOSIS — Z860100 Personal history of colon polyps, unspecified: Secondary | ICD-10-CM | POA: Diagnosis not present

## 2023-08-17 DIAGNOSIS — F1090 Alcohol use, unspecified, uncomplicated: Secondary | ICD-10-CM | POA: Diagnosis not present

## 2023-08-17 DIAGNOSIS — Z860101 Personal history of adenomatous and serrated colon polyps: Secondary | ICD-10-CM

## 2023-08-17 NOTE — Progress Notes (Signed)
GI Office Note    Referring Provider: Kerri Perches, MD Primary Care Physician:  Kerri Perches, MD Primary Gastroenterologist: Hennie Duos. Marletta Lor, DO  Date:  08/17/2023  ID:  Dillon [name], DOB [birth year], MRN [id number]   Chief Complaint   Chief Complaint  Patient presents with   New Patient (Initial Visit)    Pt referred for IDA   History of Present Illness  Dillon [name] is a 72 y.o. male with a history of COPD, glaucoma, arthritis, HTN, HLD, PVD, chronic tobacco use presenting today for evaluation of iron deficiency anemia.  Last seen in the office 09/16/2021.  Patient reported no prior colonoscopy and denies any family history of colon cancer.  Did admit to some mild constipation previously but controlled with stool softener once weekly.  No alarm symptoms present.  Did complain of some occasional RUQ discomfort not affected by bowel movements or meals.  Typically occurs when he coughs or bends a certain way with activity.  Occasionally uses NSAIDs for back pain and hydrocodone 2-3 days a week.  Has chronic shortness of breath with exertion.  Given his abdominal pain CT A/P with contrast ordered.  Patient to be scheduled for colonoscopy.  CT A/P 10/24/2021: -3.5 mm infrarenal abdominal aortic aneurysm, recommended follow-up ultrasound every 3 years -Moderate prostate enlargement -No acute abnormality seen in the abdomen or pelvis  Colonoscopy 11/04/2021: - Preparation of the colon was fair.  - Non- bleeding internal hemorrhoids.  - Three 6 to 8 mm polyps in the descending colon  - Two 3 to 4 mm polyps in the rectum. Polyp tissue not retrieved.  - The examination was otherwise normal. - Descending colon polyps revealed to be tubular adenoma and benign colonic mucosa - Repeat colonoscopy in 5 years.   CT A/P 06/27/2023: -3.6 mm abdominal aortic aneurysm (recommended follow-up ultrasound every 2 years) -Prostate enlargement -No acute findings of the stomach, large  bowel, small bowel, liver, pancreas, or spleen     Latest Ref Rng & Units 07/13/2023   10:21 AM 06/27/2023    4:31 PM 02/03/2023    8:56 AM  CBC  WBC 3.4 - 10.8 x10E3/uL 4.6  22.0  4.3   Hemoglobin 13.0 - 17.7 g/dL 9.1  9.5  65.7   Hematocrit 37.5 - 51.0 % 28.6  28.9  34.5   Platelets 150 - 450 x10E3/uL 638  388  439   MCV             81  Iron/TIBC/Ferritin/ %Sat    Component Value Date/Time   IRON 12 (L) 07/13/2023 1021   FERRITIN 37 07/13/2023 1021   Labs 07/13/2023: Normal B12 and folate.  Normal BMP and GFR.  Today:  IDA - Taking iron over the counter for the last month. Lower energy level for a little while. Sometimes has dizziness or lightheadedness. No syncope. Has had pre syncope. Has not seen any melena or brbpr.   Occasional abdominal pain in lower abdomen. Possibly related to bowel habits. He does go everyday, no straining. Sometimes has hard stools but mostly soft. Depends on hydration. Takes baby aspirin daily. No NSAIDs or goody powders. Does smoke ( 6/7 cigarettes day). Occasional alcohol (whiskey, sip every now and then). Drinks beer about 4 days/week - 1-2). More for social purposes.   Takes advil as needed for headaches/back pain but this is fairly rare.   Denies GERD, lack of appetite, weight loss, early satiety, N/V, dysphagia. No family history of colon cancer  or colon polyps.   Current Outpatient Medications  Medication Sig Dispense Refill   amLODipine (NORVASC) 10 MG tablet TAKE 1 TABLET BY MOUTH EVERY DAY 90 tablet 1   aspirin EC 81 MG tablet Take 81 mg by mouth daily.     atorvastatin (LIPITOR) 80 MG tablet TAKE 1 TABLET BY MOUTH EVERY DAY 90 tablet 3   cilostazol (PLETAL) 100 MG tablet Take 1 tablet (100 mg total) by mouth 2 (two) times daily before a meal. 60 tablet 11   fluticasone furoate-vilanterol (BREO ELLIPTA) 200-25 MCG/ACT AEPB Inhale 1 puff into the lungs daily.     HYDROcodone-acetaminophen (NORCO) 7.5-325 MG tablet One tablet every six hours as  need for pain.  Must last 28 days. 110 tablet 0   latanoprost (XALATAN) 0.005 % ophthalmic solution 1 drop daily.     spironolactone (ALDACTONE) 50 MG tablet TAKE 1 TABLET BY MOUTH EVERY DAY 90 tablet 1   rosuvastatin (CRESTOR) 40 MG tablet Take 1 tablet (40 mg total) by mouth daily. 90 tablet 3   UNABLE TO FIND Nebulizer facemask and tubing x 1  DX J44.9 1 each 0   No current facility-administered medications for this visit.    Past Medical History:  Diagnosis Date   Arthritis    2012   Asthma    childhood   Carotid artery stenosis    COPD (chronic obstructive pulmonary disease) (HCC)    Glaucoma    Hyperlipidemia    1990   Hypertension    1990   Nicotine addiction    PAD (peripheral artery disease) (HCC) 2015    Past Surgical History:  Procedure Laterality Date   COLONOSCOPY WITH PROPOFOL N/A 11/04/2021   Procedure: COLONOSCOPY WITH PROPOFOL;  Surgeon: Lanelle Bal, DO;  Location: [facility];  Service: Endoscopy;  Laterality: N/A;  9:30am   KNEE ARTHROSCOPY     both knees    LOWER EXTREMITY ANGIOGRAPHY N/A 02/04/2018   Procedure: LOWER EXTREMITY ANGIOGRAPHY;  Surgeon: Fransisco Hertz, MD;  Location[facility] INVASIVE CV LAB;  Service: Cardiovascular;  Laterality: N/A;   POLYPECTOMY  11/04/2021   Procedure: POLYPECTOMY;  Surgeon: Lanelle Bal, DO;  Location: [facility];  Service: Endoscopy;;    Family History  Problem Relation Age of Onset   Heart attack Mother    Diabetes Mother    Hypertension Mother    Stroke Brother    Hypertension Sister    Hypertension Sister    Hypertension Sister    Hypertension Brother    Colon cancer Neg Hx     Allergies as of 08/17/2023 - Review Complete 08/17/2023  Allergen Reaction Noted   Penicillins Hives and Other (See Comments) 02/24/2008    Social History   Socioeconomic History   Marital status: Single    Spouse name: Not on file   Number of children: Not on file   Years of education: Not on file   Highest  education level: Not on file  Occupational History   Not on file  Tobacco Use   Smoking status: Every Day    Current packs/day: 0.25    Average packs/day: 0.3 packs/day for 53.8 years (13.5 ttl pk-yrs)    Types: Cigarettes    Start date: 10/16/1969    Passive exposure: Never   Smokeless tobacco: Never  Vaping Use   Vaping status: Never Used  Substance and Sexual Activity   Alcohol use: Yes    Comment: occassional, max of 72 oz pwr month  Drug use: No   Sexual activity: Yes    Partners: Female  Other Topics Concern   Not on file  Social History Narrative   Not on file   Social Determinants of Health   Financial Resource Strain: Low Risk  (05/25/2023)   Overall Financial Resource Strain (CARDIA)    Difficulty of Paying Living Expenses: Not hard at all  Food Insecurity: No Food Insecurity (05/25/2023)   Hunger Vital Sign    Worried About Running Out of Food in the Last Year: Never true    Ran Out of Food in the Last Year: Never true  Transportation Needs: No Transportation Needs (05/25/2023)   PRAPARE - Administrator, Civil Service (Medical): No    Lack of Transportation (Non-Medical): No  Physical Activity: Sufficiently Active (05/25/2023)   Exercise Vital Sign    Days of Exercise per Week: 7 days    Minutes of Exercise per Session: 30 min  Stress: No Stress Concern Present (05/25/2023)   [employer] of Occupational Health - Occupational Stress Questionnaire    Feeling of Stress : Not at all  Social Connections: Moderately Isolated (05/25/2023)   Social Connection and Isolation Panel [NHANES]    Frequency of Communication with Friends and Family: More than three times a week    Frequency of Social Gatherings with Friends and Family: More than three times a week    Attends Religious Services: 1 to 4 times per year    Active Member of [employer] or Organizations: No    Attends Banker Meetings: Never    Marital Status: Never married     Review  of Systems   Gen: Denies fever, chills, anorexia. Denies fatigue, weakness, weight loss.  CV: Denies chest pain, palpitations, syncope, peripheral edema, and claudication. Resp: Denies dyspnea at rest, cough, wheezing, coughing up blood, and pleurisy. GI: See HPI Derm: Denies rash, itching, dry skin Psych: Denies depression, anxiety, memory loss, confusion. No homicidal or suicidal ideation.  Heme: Denies bruising, bleeding, and enlarged lymph nodes.  Physical Exam   BP 137/75   Pulse 85   Temp 98.6 F (37 C)   Ht 6' 2.5" (1.892 m)   Wt 153 lb 9.6 oz (69.7 kg)   BMI 19.46 kg/m   General:   Alert and oriented. No distress noted. Pleasant and cooperative.  Head:  Normocephalic and atraumatic. Eyes:  Conjuctiva clear without scleral icterus. Mouth:  Oral mucosa pink and moist. Good dentition. No lesions. Lungs:  Clear to auscultation bilaterally. No wheezes, rales, or rhonchi. No distress.  Heart:  S1, S2 present without murmurs appreciated.  Abdomen:  +BS, soft, non-tender and non-distended. No rebound or guarding. No HSM or masses noted. Rectal: deferred Msk:  Symmetrical without gross deformities. Normal posture. Extremities:  Without edema. Neurologic:  Alert and  oriented x4 Psych:  Alert and cooperative. Normal mood and affect.   Assessment  ELIAZ GARBARINI is a 72 y.o. male with a history of COPD, glaucoma, arthritis, HTN, HLD, PVD, chronic tobacco use presenting today for evaluation of iron deficiency anemia.  IDA: Most recent labs in September with hemoglobin 9.1 (this has down trended from 11.2 in April 2024).  Iron studies with iron low at 12, ferritin 37 with normal B12 and folate.  Last colonoscopy in January 2023 with 5 polyps removed from the descending colon and rectum.  Rectal polyps unable to be retrieved but descending colon polyps revealed to be tubular adenomas and benign tissue.  Due  for surveillance and 2028.  Currently without any rectal bleeding or  significant lower GI symptoms.  He does admit to about a half a pack cigarettes per day and some mild regular alcohol use with about 1-2 beers 4-5 times per week and intermittent sip of liquor.  Given his tobacco use and alcohol use differentials are high for gastritis, duodenitis, esophagitis.  Unable to exclude AVM, angiectasia, malignancy (although less likely).  He has had some presyncope symptoms and occasional dizziness but denies any shortness of breath or chest pain.  No pica or dysphagia.  Has not had upper endoscopy in the past.  Will start with this for workup and then consider Givens capsule and if negative will circle back to likely perform colonoscopy.  Advised him to continue daily iron supplement recommended by PCP.  Will recheck labs and iron studies in 4 to 6 weeks.  Less likely celiac but will obtain labs to rule this out.  Given primary GI attending is Dr. Marletta Lor, will discuss with him as well as Dr. Jena Gauss as patient has requested for procedure to be on Wednesday only given his schedule.  Will also need to determine whether or not Pletal needs to be held prior to procedure.  PLAN   Proceed with upper endoscopy with propofol by Dr. Jena Gauss in near future: the risks, benefits, and alternatives have been discussed with the patient in detail. The patient states understanding and desires to proceed. ASA 3 (Patient can only do Wednesday's) PLETAL (discuss if needs to be held with physician) Possible givens capsule if no etiology found Continue daily iron therapy.  CBC, iron, Ttg, Ttg IGA in 4 to 6 weeks Follow up in 3 months  Brooke Bonito, MSN, FNP-BC, AGACNP-BC [facility]

## 2023-08-17 NOTE — Telephone Encounter (Signed)
Spoke with Dr. Jena Gauss and do not stop Pletal.   Patient scheduled for EGD with propofol asa 3 with Dr. Jena Gauss since he can only do a Wednesday on 11/27. Aware will call back with pre-op appt. Instructions sent.    PA approved via cohere. Authorization #[id number], DOS:  09/23/2023 - 10/27/2023

## 2023-08-17 NOTE — Patient Instructions (Addendum)
We will get you scheduled for an upper endoscopy in the near future.  This would likely be with Dr. Jena Gauss given Dr. Marletta Lor will not have any procedures on a Wednesday for you.  We will discuss with the doctor to see if your Pletal needs to be held for few days prior to procedure or not.  We will plan to recheck some labs in 4 to 6 weeks.  We will mail you lab slips when is closer to time to have these drawn.  Please continue taking your iron once daily.  If you develop any bright red blood in your stools or any change in your bowel habits please let me know.  If you are having difficulty with hard stools at times you can take a stool softener as needed.  I will see you in the office for follow-up in about 3 months.  It was a pleasure to see you today. I want to create trusting relationships with patients. If you receive a survey regarding your visit,  I greatly appreciate you taking time to fill this out on paper or through your MyChart. I value your feedback.  Brooke Bonito, MSN, FNP-BC, AGACNP-BC [facility]

## 2023-08-18 ENCOUNTER — Encounter: Payer: Self-pay | Admitting: *Deleted

## 2023-09-04 ENCOUNTER — Telehealth: Payer: Self-pay | Admitting: Orthopaedic Surgery

## 2023-09-04 ENCOUNTER — Encounter: Payer: Medicare HMO | Admitting: Family Medicine

## 2023-09-04 NOTE — Telephone Encounter (Signed)
DR. Hilda Lias   Patient came into the office to request refill on his pain medicine    HYDROcodone-acetaminophen (NORCO) 7.5-325 MG tablet   Pharmacy  [facility] in [place]

## 2023-09-07 MED ORDER — HYDROCODONE-ACETAMINOPHEN 7.5-325 MG PO TABS: ORAL_TABLET | ORAL | 0 refills | Status: DC

## 2023-09-17 NOTE — Patient Instructions (Signed)
Dillon [name]  09/17/2023     @PREFPERIOPPHARMACY @   Your procedure is scheduled on  09/23/2023.   [facility] to [facility] at  1130  A.M.   Call this number if you have problems the morning of surgery:  [phone]  If you experience any cold or flu symptoms such as cough, fever, chills, shortness of breath, etc. between now and your scheduled surgery, please notify us at the above number.   Remember:  Follow the diet instructions given to you by the office.    You may drink clear liquids until 0930 am on 09/23/2023.    Clear liquids allowed are:                    Water, Juice (No red color; non-citric and without pulp; diabetics please choose diet or no sugar options), Carbonated beverages (diabetics please choose diet or no sugar options), Clear Tea (No creamer, milk, or cream, including half & half and powdered creamer), Black Coffee Only (No creamer, milk or cream, including half & half and powdered creamer), and Clear Sports drink (No red color; diabetics please choose diet or no sugar options)    Take these medicines the morning of surgery with A SIP OF WATER                     amlodipine, hydrocodone (If needed).    Do not wear jewelry, make-up or nail polish, including gel polish,  artificial nails, or any other type of covering on natural nails (fingers and  toes).  Do not wear lotions, powders, or perfumes, or deodorant.  Do not shave 48 hours prior to surgery.  Men may shave face and neck.  Do not bring valuables to the hospital.  [facility] is not responsible for any belongings or valuables.  Contacts, dentures or bridgework may not be worn into surgery.  Leave your suitcase in the car.  After surgery it may be brought to your room.  For patients admitted to the hospital, discharge time will be determined by your treatment team.  Patients discharged the day of surgery will not be allowed to drive home and must have someone with them for 24 hours.     Special instructions:   DO NOT smoke tobacco or vape for 24 hours before your procedure.  Please read over the following fact sheets that you were given. Anesthesia Post-op Instructions and Care and Recovery After Surgery      Upper Endoscopy, Adult, Care After After the procedure, it is common to have a sore throat. It is also common to have: Mild stomach pain or discomfort. Bloating. Nausea. Follow these instructions at home: The instructions below may help you care for yourself at home. Your health care provider may give you more instructions. If you have questions, ask your health care provider. If you were given a sedative during the procedure, it can affect you for several hours. Do not drive or operate machinery until your health care provider says that it is safe. If you will be going home right after the procedure, plan to have a responsible adult: Take you home from the hospital or clinic. You will not be allowed to drive. Care for you for the time you are told. Follow instructions from your health care provider about what you may eat and drink. Return to your normal activities as told by your health care provider. Ask your health care provider what  activities are safe for you. Take over-the-counter and prescription medicines only as told by your health care provider. Contact a health care provider if you: Have a sore throat that lasts longer than one day. Have trouble swallowing. Have a fever. Get help right away if you: Vomit blood or your vomit looks like coffee grounds. Have bloody, black, or tarry stools. Have a very bad sore throat or you cannot swallow. Have difficulty breathing or very bad pain in your chest or abdomen. These symptoms may be an emergency. Get help right away. Call 911. Do not wait to see if the symptoms will go away. Do not drive yourself to the hospital. Summary After the procedure, it is common to have a sore throat, mild stomach  discomfort, bloating, and nausea. If you were given a sedative during the procedure, it can affect you for several hours. Do not drive until your health care provider says that it is safe. Follow instructions from your health care provider about what you may eat and drink. Return to your normal activities as told by your health care provider. This information is not intended to replace advice given to you by your health care provider. Make sure you discuss any questions you have with your health care provider. Document Revised: 01/22/2022 Document Reviewed: 01/22/2022 Elsevier Patient Education  2024 Elsevier Inc. Monitored Anesthesia Care, Care After The following information offers guidance on how to care for yourself after your procedure. Your health care provider may also give you more specific instructions. If you have problems or questions, contact your health care provider. What can I expect after the procedure? After the procedure, it is common to have: Tiredness. Little or no memory about what happened during or after the procedure. Impaired judgment when it comes to making decisions. Nausea or vomiting. Some trouble with balance. Follow these instructions at home: For the time period you were told by your health care provider:  Rest. Do not participate in activities where you could fall or become injured. Do not drive or use machinery. Do not drink alcohol. Do not take sleeping pills or medicines that cause drowsiness. Do not make important decisions or sign legal documents. Do not take care of children on your own. Medicines Take over-the-counter and prescription medicines only as told by your health care provider. If you were prescribed antibiotics, take them as told by your health care provider. Do not stop using the antibiotic even if you start to feel better. Eating and drinking Follow instructions from your health care provider about what you may eat and drink. Drink  enough fluid to keep your urine pale yellow. If you vomit: Drink clear fluids slowly and in small amounts as you are able. Clear fluids include water, ice chips, low-calorie sports drinks, and fruit juice that has water added to it (diluted fruit juice). Eat light and bland foods in small amounts as you are able. These foods include bananas, applesauce, rice, lean meats, toast, and crackers. General instructions  Have a responsible adult stay with you for the time you are told. It is important to have someone help care for you until you are awake and alert. If you have sleep apnea, surgery and some medicines can increase your risk for breathing problems. Follow instructions from your health care provider about wearing your sleep device: When you are sleeping. This includes during daytime naps. While taking prescription pain medicines, sleeping medicines, or medicines that make you drowsy. Do not use any products that contain nicotine  or tobacco. These products include cigarettes, chewing tobacco, and vaping devices, such as e-cigarettes. If you need help quitting, ask your health care provider. Contact a health care provider if: You feel nauseous or vomit every time you eat or drink. You feel light-headed. You are still sleepy or having trouble with balance after 24 hours. You get a rash. You have a fever. You have redness or swelling around the IV site. Get help right away if: You have trouble breathing. You have new confusion after you get home. These symptoms may be an emergency. Get help right away. Call 911. Do not wait to see if the symptoms will go away. Do not drive yourself to the hospital. This information is not intended to replace advice given to you by your health care provider. Make sure you discuss any questions you have with your health care provider. Document Revised: 03/10/2022 Document Reviewed: 03/10/2022 Elsevier Patient Education  2024 [employer].

## 2023-09-18 ENCOUNTER — Encounter (HOSPITAL_COMMUNITY)
Admission: RE | Admit: 2023-09-18 | Discharge: 2023-09-18 | Disposition: A | Discharge status: Home / Self Care | Payer: Medicare HMO | Source: Ambulatory Visit | Attending: Internal Medicine

## 2023-09-18 VITALS — BP 143/78 | HR 92 | Temp 97.7°F | Resp 18 | Ht 74.5 in | Wt 153.7 lb

## 2023-09-18 DIAGNOSIS — F17200 Nicotine dependence, unspecified, uncomplicated: Secondary | ICD-10-CM | POA: Insufficient documentation

## 2023-09-18 DIAGNOSIS — Z01810 Encounter for preprocedural cardiovascular examination: Secondary | ICD-10-CM | POA: Insufficient documentation

## 2023-09-18 DIAGNOSIS — I10 Essential (primary) hypertension: Secondary | ICD-10-CM | POA: Diagnosis not present

## 2023-09-23 ENCOUNTER — Encounter (HOSPITAL_COMMUNITY): Payer: Self-pay | Admitting: Internal Medicine

## 2023-09-23 ENCOUNTER — Telehealth: Payer: Self-pay | Admitting: *Deleted

## 2023-09-23 ENCOUNTER — Ambulatory Visit (HOSPITAL_COMMUNITY): Payer: Medicare HMO | Admitting: Anesthesiology

## 2023-09-23 ENCOUNTER — Encounter (HOSPITAL_COMMUNITY)
Admission: RE | Disposition: A | Discharge status: Home / Self Care | Payer: Self-pay | Source: Home / Self Care | Attending: Internal Medicine

## 2023-09-23 ENCOUNTER — Other Ambulatory Visit: Payer: Self-pay

## 2023-09-23 ENCOUNTER — Ambulatory Visit (HOSPITAL_COMMUNITY)
Admission: RE | Admit: 2023-09-23 | Discharge: 2023-09-23 | Disposition: A | Discharge status: Home / Self Care | Payer: Medicare HMO | Attending: Internal Medicine | Admitting: Internal Medicine

## 2023-09-23 DIAGNOSIS — K259 Gastric ulcer, unspecified as acute or chronic, without hemorrhage or perforation: Secondary | ICD-10-CM | POA: Insufficient documentation

## 2023-09-23 DIAGNOSIS — M1990 Unspecified osteoarthritis, unspecified site: Secondary | ICD-10-CM | POA: Diagnosis not present

## 2023-09-23 DIAGNOSIS — K2970 Gastritis, unspecified, without bleeding: Secondary | ICD-10-CM | POA: Diagnosis not present

## 2023-09-23 DIAGNOSIS — R310 Gross hematuria: Secondary | ICD-10-CM | POA: Diagnosis not present

## 2023-09-23 DIAGNOSIS — D509 Iron deficiency anemia, unspecified: Secondary | ICD-10-CM

## 2023-09-23 DIAGNOSIS — H409 Unspecified glaucoma: Secondary | ICD-10-CM | POA: Diagnosis not present

## 2023-09-23 DIAGNOSIS — K269 Duodenal ulcer, unspecified as acute or chronic, without hemorrhage or perforation: Secondary | ICD-10-CM

## 2023-09-23 DIAGNOSIS — F17210 Nicotine dependence, cigarettes, uncomplicated: Secondary | ICD-10-CM | POA: Insufficient documentation

## 2023-09-23 DIAGNOSIS — I739 Peripheral vascular disease, unspecified: Secondary | ICD-10-CM | POA: Insufficient documentation

## 2023-09-23 DIAGNOSIS — J4489 Other specified chronic obstructive pulmonary disease: Secondary | ICD-10-CM | POA: Diagnosis not present

## 2023-09-23 DIAGNOSIS — I6529 Occlusion and stenosis of unspecified carotid artery: Secondary | ICD-10-CM | POA: Diagnosis not present

## 2023-09-23 DIAGNOSIS — K2100 Gastro-esophageal reflux disease with esophagitis, without bleeding: Secondary | ICD-10-CM | POA: Insufficient documentation

## 2023-09-23 DIAGNOSIS — K449 Diaphragmatic hernia without obstruction or gangrene: Secondary | ICD-10-CM | POA: Diagnosis not present

## 2023-09-23 DIAGNOSIS — Z8249 Family history of ischemic heart disease and other diseases of the circulatory system: Secondary | ICD-10-CM | POA: Diagnosis not present

## 2023-09-23 DIAGNOSIS — I2510 Atherosclerotic heart disease of native coronary artery without angina pectoris: Secondary | ICD-10-CM | POA: Diagnosis not present

## 2023-09-23 DIAGNOSIS — I10 Essential (primary) hypertension: Secondary | ICD-10-CM | POA: Diagnosis not present

## 2023-09-23 DIAGNOSIS — G709 Myoneural disorder, unspecified: Secondary | ICD-10-CM | POA: Insufficient documentation

## 2023-09-23 DIAGNOSIS — K2960 Other gastritis without bleeding: Secondary | ICD-10-CM | POA: Diagnosis not present

## 2023-09-23 DIAGNOSIS — B9681 Helicobacter pylori [H. pylori] as the cause of diseases classified elsewhere: Secondary | ICD-10-CM | POA: Diagnosis not present

## 2023-09-23 DIAGNOSIS — K222 Esophageal obstruction: Secondary | ICD-10-CM | POA: Diagnosis not present

## 2023-09-23 DIAGNOSIS — J449 Chronic obstructive pulmonary disease, unspecified: Secondary | ICD-10-CM | POA: Diagnosis not present

## 2023-09-23 HISTORY — PX: ESOPHAGOGASTRODUODENOSCOPY (EGD) WITH PROPOFOL: SHX5813

## 2023-09-23 HISTORY — PX: BIOPSY: SHX5522

## 2023-09-23 SURGERY — ESOPHAGOGASTRODUODENOSCOPY (EGD) WITH PROPOFOL
Anesthesia: General

## 2023-09-23 MED ORDER — PANTOPRAZOLE SODIUM 40 MG PO TBEC: 40.0000 mg | DELAYED_RELEASE_TABLET | Freq: Every day | ORAL | 11 refills | Status: DC

## 2023-09-23 MED ORDER — PROPOFOL 10 MG/ML IV BOLUS
INTRAVENOUS | Status: DC | PRN
  Administered 2023-09-23: 30 mg via INTRAVENOUS
  Administered 2023-09-23: 50 mg via INTRAVENOUS
  Administered 2023-09-23: 20 mg via INTRAVENOUS
  Administered 2023-09-23 (×2): 50 mg via INTRAVENOUS

## 2023-09-23 MED ORDER — LIDOCAINE HCL 1 % IJ SOLN
INTRAMUSCULAR | Status: DC | PRN
  Administered 2023-09-23: 50 mg via INTRADERMAL

## 2023-09-23 MED ORDER — STERILE WATER FOR IRRIGATION IR SOLN
Status: DC | PRN
  Administered 2023-09-23: 50 mL

## 2023-09-23 MED ORDER — LACTATED RINGERS IV SOLN: INTRAVENOUS | Status: DC | PRN

## 2023-09-23 NOTE — Telephone Encounter (Signed)
-----   Message from Eula Listen sent at 09/23/2023 11:05 AM EST -----  new prescription Protonix 40 mg pill dispense 30 with 11 refills.  1 daily 30 minutes before breakfast.  Thanks.

## 2023-09-23 NOTE — Anesthesia Postprocedure Evaluation (Signed)
Anesthesia Post Note  Patient: Dillon [name]  Procedure(s) Performed: ESOPHAGOGASTRODUODENOSCOPY (EGD) WITH PROPOFOL BIOPSY  Patient location during evaluation: PACU Anesthesia Type: General Level of consciousness: awake and alert Pain management: pain level controlled Vital Signs Assessment: post-procedure vital signs reviewed and stable Respiratory status: spontaneous breathing, nonlabored ventilation, respiratory function stable and patient connected to nasal cannula oxygen Cardiovascular status: blood pressure returned to baseline and stable Postop Assessment: no apparent nausea or vomiting Anesthetic complications: no   There were no known notable events for this encounter.   Last Vitals:  Vitals:   09/23/23 0951 09/23/23 1057  BP: 138/89 113/75  Pulse: 83 86  Resp: 16 18  Temp: 36.8 C 36.6 C  SpO2: 98% 100%    Last Pain:  Vitals:   09/23/23 1057  TempSrc: Oral  PainSc: 0-No pain                 Breigh Annett L Anishka Bushard

## 2023-09-23 NOTE — Telephone Encounter (Signed)
Rx sent to pharmacy   

## 2023-09-23 NOTE — Anesthesia Preprocedure Evaluation (Signed)
Anesthesia Evaluation  Patient identified by MRN, date of birth, ID band Patient awake    Reviewed: Allergy & Precautions, H&P , NPO status , Patient's Chart, lab work & pertinent test results, reviewed documented beta blocker date and time   Airway Mallampati: II  TM Distance: >3 FB Neck ROM: full    Dental no notable dental hx. (+) Dental Advisory Given, Teeth Intact   Pulmonary asthma , COPD, Current Smoker and Patient abstained from smoking. Stop bang 3   Pulmonary exam normal breath sounds clear to auscultation       Cardiovascular Exercise Tolerance: Good hypertension, + CAD and + Peripheral Vascular Disease  Normal cardiovascular exam Rhythm:regular Rate:Normal     Neuro/Psych Carotid stenosis. glaucoma  Neuromuscular disease  negative psych ROS   GI/Hepatic negative GI ROS, Neg liver ROS,,,  Endo/Other  negative endocrine ROS    Renal/GU negative Renal ROS  negative genitourinary   Musculoskeletal  (+) Arthritis , Osteoarthritis,    Abdominal   Peds  Hematology negative hematology ROS (+)   Anesthesia Other Findings   Reproductive/Obstetrics negative OB ROS                             Anesthesia Physical Anesthesia Plan  ASA: 3  Anesthesia Plan: General   Post-op Pain Management: Minimal or no pain anticipated   Induction: Intravenous  PONV Risk Score and Plan: Propofol infusion  Airway Management Planned: Nasal Cannula and Natural Airway  Additional Equipment: None  Intra-op Plan:   Post-operative Plan:   Informed Consent: I have reviewed the patients History and Physical, chart, labs and discussed the procedure including the risks, benefits and alternatives for the proposed anesthesia with the patient or authorized representative who has indicated his/her understanding and acceptance.     Dental Advisory Given  Plan Discussed with: CRNA  Anesthesia Plan  Comments:         Anesthesia Quick Evaluation

## 2023-09-23 NOTE — H&P (Signed)
@LOGO @   Primary Care Physician:  Kerri Perches, MD Primary Gastroenterologist:  Dr. Jena Gauss  Pre-Procedure History & Physical: HPI:  Dillon [name] is a 72 y.o. male here for  further evaluation of IDA via EGD.  Colonoscopy last year did yielded multiple adenomas removed.    Past Medical History:  Diagnosis Date   Arthritis    2012   Asthma    childhood   Carotid artery stenosis    COPD (chronic obstructive pulmonary disease) (HCC)    Glaucoma    Hyperlipidemia    1990   Hypertension    1990   Nicotine addiction    PAD (peripheral artery disease) (HCC) 2015    Past Surgical History:  Procedure Laterality Date   COLONOSCOPY WITH PROPOFOL N/A 11/04/2021   Procedure: COLONOSCOPY WITH PROPOFOL;  Surgeon: Lanelle Bal, DO;  [facility];  Service: Endoscopy;  Laterality: N/A;  9:30am   KNEE ARTHROSCOPY     both knees    LOWER EXTREMITY ANGIOGRAPHY N/A 02/04/2018   Procedure: LOWER EXTREMITY ANGIOGRAPHY;  Surgeon: Fransisco Hertz, MD;  Location: [facility] INVASIVE CV LAB;  Service: Cardiovascular;  Laterality: N/A;   POLYPECTOMY  11/04/2021   Procedure: POLYPECTOMY;  Surgeon: Lanelle Bal, DO;  Location: [facility];  Service: Endoscopy;;    Prior to Admission medications   Medication Sig Start Date End Date Taking? Authorizing Provider  amLODipine (NORVASC) 10 MG tablet TAKE 1 TABLET BY MOUTH EVERY DAY 06/18/23  Yes Kerri Perches, MD  aspirin EC 81 MG tablet Take 81 mg by mouth daily.   Yes [provider]  atorvastatin (LIPITOR) 80 MG tablet TAKE 1 TABLET BY MOUTH EVERY DAY 05/25/23  Yes Kerri Perches, MD  cilostazol (PLETAL) 100 MG tablet Take 1 tablet (100 mg total) by mouth 2 (two) times daily before a meal. 02/25/23  Yes Early, Kristen Loader, MD  fluticasone furoate-vilanterol (BREO ELLIPTA) 200-25 MCG/ACT AEPB Inhale 1 puff into the lungs daily.   Yes [provider]  latanoprost (XALATAN) 0.005 % ophthalmic solution 1 drop daily.  01/05/21  Yes [provider]  spironolactone (ALDACTONE) 50 MG tablet TAKE 1 TABLET BY MOUTH EVERY DAY 06/18/23  Yes Kerri Perches, MD  HYDROcodone-acetaminophen (NORCO) 7.5-325 MG tablet One tablet every six hours as need for pain.  Must last 28 days. 09/07/23   Darreld Mclean, MD  rosuvastatin (CRESTOR) 40 MG tablet Take 1 tablet (40 mg total) by mouth daily. 05/14/23 08/12/23  Strader, Lennart Pall, PA-C  UNABLE TO FIND Nebulizer facemask and tubing x 1  DX J44.9 10/28/18   Kerri Perches, MD    Allergies as of 08/17/2023 - Review Complete 08/17/2023  Allergen Reaction Noted   Penicillins Hives and Other (See Comments) 02/24/2008    Family History  Problem Relation Age of Onset   Heart attack Mother    Diabetes Mother    Hypertension Mother    Stroke Brother    Hypertension Sister    Hypertension Sister    Hypertension Sister    Hypertension Brother    Colon cancer Neg Hx     Social History   Socioeconomic History   Marital status: Single    Spouse name: Not on file   Number of children: Not on file   Years of education: Not on file   Highest education level: Not on file  Occupational History   Not on file  Tobacco Use   Smoking status: Every  Day    Current packs/day: 0.25    Average packs/day: 0.3 packs/day for 53.9 years (13.5 ttl pk-yrs)    Types: Cigarettes    Start date: 10/16/1969    Passive exposure: Never   Smokeless tobacco: Never  Vaping Use   Vaping status: Never Used  Substance and Sexual Activity   Alcohol use: Yes    Comment: occassional, max of 72 oz pwr month   Drug use: No   Sexual activity: Yes    Partners: Female  Other Topics Concern   Not on file  Social History Narrative   Not on file   Social Determinants of Health   Financial Resource Strain: Low Risk  (05/25/2023)   Overall Financial Resource Strain (CARDIA)    Difficulty of Paying Living Expenses: Not hard at all  Food Insecurity: No Food Insecurity (05/25/2023)    Hunger Vital Sign    Worried About Running Out of Food in the Last Year: Never true    Ran Out of Food in the Last Year: Never true  Transportation Needs: No Transportation Needs (05/25/2023)   PRAPARE - Administrator, Civil Service (Medical): No    Lack of Transportation (Non-Medical): No  Physical Activity: Sufficiently Active (05/25/2023)   Exercise Vital Sign    Days of Exercise per Week: 7 days    Minutes of Exercise per Session: 30 min  Stress: No Stress Concern Present (05/25/2023)   [employer] of Occupational Health - Occupational Stress Questionnaire    Feeling of Stress : Not at all  Social Connections: Moderately Isolated (05/25/2023)   Social Connection and Isolation Panel [NHANES]    Frequency of Communication with Friends and Family: More than three times a week    Frequency of Social Gatherings with Friends and Family: More than three times a week    Attends Religious Services: 1 to 4 times per year    Active Member of [employer] or Organizations: No    Attends Banker Meetings: Never    Marital Status: Never married  Intimate Partner Violence: Not At Risk (05/25/2023)   Humiliation, Afraid, Rape, and Kick questionnaire    Fear of Current or Ex-Partner: No    Emotionally Abused: No    Physically Abused: No    Sexually Abused: No    Review of Systems: See HPI, otherwise negative ROS  Physical Exam: BP 138/89   Pulse 83   Temp 98.2 F (36.8 C) (Oral)   Resp 16   Ht 6' 2.5" (1.892 m)   Wt 69.7 kg   SpO2 98%   BMI 19.46 kg/m  General:   Alert,  Well-developed, well-nourished, pleasant and cooperative in NAD Mouth:  No deformity or lesions. Neck:  Supple; no masses or thyromegaly. No significant cervical adenopathy. Lungs:  Clear throughout to auscultation.   No wheezes, crackles, or rhonchi. No acute distress. Heart:  Regular rate and rhythm; no murmurs, clicks, rubs,  or gallops. Abdomen: Non-distended, normal bowel sounds.  Soft and  nontender without appreciable mass or hepatosplenomegaly.   Impression/Plan:    72 year old gentleman iron deficiency anemia essentially devoid of any GI symptoms here for diagnostic EGD denies dysphagia.  He does note intermittent gross hematuria from time to time recently.    I have offered the patient a diagnostic EGD today. The risks, benefits, limitations, alternatives and imponderables have been reviewed with the patient. Potential for esophageal dilation, biopsy, etc. have also been reviewed.  Questions have been answered. All parties agreeable.  Notice: This dictation was prepared with Dragon dictation along with smaller phrase technology. Any transcriptional errors that result from this process are unintentional and may not be corrected upon review.

## 2023-09-23 NOTE — Discharge Instructions (Signed)
EGD Discharge instructions Please read the instructions outlined below and refer to this sheet in the next few weeks. These discharge instructions provide you with general information on caring for yourself after you leave the hospital. Your doctor may also give you specific instructions. While your treatment has been planned according to the most current medical practices available, unavoidable complications occasionally occur. If you have any problems or questions after discharge, please call your doctor. ACTIVITY You may resume your regular activity but move at a slower pace for the next 24 hours.  Take frequent rest periods for the next 24 hours.  Walking will help expel (get rid of) the air and reduce the bloated feeling in your abdomen.  No driving for 24 hours (because of the anesthesia (medicine) used during the test).  You may shower.  Do not sign any important legal documents or operate any machinery for 24 hours (because of the anesthesia used during the test).  NUTRITION Drink plenty of fluids.  You may resume your normal diet.  Begin with a light meal and progress to your normal diet.  Avoid alcoholic beverages for 24 hours or as instructed by your caregiver.  MEDICATIONS You may resume your normal medications unless your caregiver tells you otherwise.  WHAT YOU CAN EXPECT TODAY You may experience abdominal discomfort such as a feeling of fullness or "gas" pains.  FOLLOW-UP Your doctor will discuss the results of your test with you.  SEEK IMMEDIATE MEDICAL ATTENTION IF ANY OF THE FOLLOWING OCCUR: Excessive nausea (feeling sick to your stomach) and/or vomiting.  Severe abdominal pain and distention (swelling).  Trouble swallowing.  Temperature over 101 F (37.8 C).  Rectal bleeding or vomiting of blood.      your esophagus and stomach are inflamed  -  biopsies of your stomach were taken   began Protonix or pantoprazole 40 mg daily 30 minutes before breakfast to calm your  esophagus and stomach down.  A new prescription has already been sent to  your pharmacy from the office.   further recommendations to follow pending review of pathology report  Office visit with [place] in 6 weeks [facility])   as a separate issue, please see your urologist regarding recurrent blood in your urine

## 2023-09-23 NOTE — Transfer of Care (Signed)
Immediate Anesthesia Transfer of Care Note  Patient: Dillon [name]  Procedure(s) Performed: ESOPHAGOGASTRODUODENOSCOPY (EGD) WITH PROPOFOL BIOPSY  Patient Location: Short Stay  Anesthesia Type:General  Level of Consciousness: awake  Airway & Oxygen Therapy: Patient Spontanous Breathing  Post-op Assessment: Report given to RN  Post vital signs: Reviewed  Last Vitals:  Vitals Value Taken Time  BP    Temp    Pulse    Resp    SpO2      Last Pain:  Vitals:   09/23/23 0951  TempSrc: Oral  PainSc: 0-No pain         Complications: No notable events documented.

## 2023-09-23 NOTE — Op Note (Signed)
[facility] Patient Name: Dillon [name] Procedure Date: 09/23/2023 10:12 AM MRN: [id number] Date of Birth: [birth year] Attending MD: Gennette Pac , MD, [id number] CSN: [id number] Age: 72 Admit Type: Outpatient Procedure:                Upper GI endoscopy Indications:              Iron deficiency anemia Providers:                Gennette Pac, MD, Buel Ream. Thomasena Edis RN, RN,                            Dyann Ruddle Referring MD:              Medicines:                Propofol per Anesthesia Complications:            No immediate complications. Estimated Blood Loss:     Estimated blood loss was minimal. Procedure:                Pre-Anesthesia Assessment:                           - Prior to the procedure, a History and Physical                            was performed, and patient medications and                            allergies were reviewed. The patient's tolerance of                            previous anesthesia was also reviewed. The risks                            and benefits of the procedure and the sedation                            options and risks were discussed with the patient.                            All questions were answered, and informed consent                            was obtained. Prior Anticoagulants: The patient has                            taken no anticoagulant or antiplatelet agents. ASA                            Grade Assessment: III - A patient with severe                            systemic disease. After reviewing the risks and  benefits, the patient was deemed in satisfactory                            condition to undergo the procedure.                           After obtaining informed consent, the endoscope was                            passed under direct vision. Throughout the                            procedure, the patient's blood pressure, pulse, and                            oxygen  saturations were monitored continuously. The                            [id number]-[id number] ([id number]) scope was introduced through the                            mouth, and advanced to the second part of duodenum.                            The upper GI endoscopy was accomplished without                            difficulty. The patient tolerated the procedure                            well. Scope In: 10:41:23 AM Scope Out: 10:47:58 AM Total Procedure Duration: 0 hours 6 minutes 35 seconds  Findings:      A moderate Schatzki ring was found at the gastroesophageal junction.       Esophageal erosions straddling the Schatzki's ring within 5 mm of the GE       junction. No Barrett's epithelium no mass.      Gastric cavity empty. A medium-sized hiatal hernia was present. Multiple       Sheria Lang lesions present. Scattered gastric body and antrum erosions; no       ulcer or infiltrating process. Patent pylorus      Scattered bulbar erosions; otherwise, the duodenal bulb and second       portion of the duodenum were normal. Biopsies the abnormal gastric       mucosa taken. No esophageal dilation as patient denies dysphagia. Impression:               - Moderate Schatzki ring -not manipulated (no                            dysphagia). Erosive reflux esophagitis                           - Medium-sized hiatal hernia with Sheria Lang erosions.  Distal gastric erosions also present?"status post                            biopsy                           -Duodenal erosions; otherwise normal-appearing bulb                            and second portion.                           -Patient reports intermittent gross hematuria                            recently. Moderate Sedation:      Moderate (conscious) sedation was personally administered by an       anesthesia professional. The following parameters were monitored: oxygen       saturation, heart rate, blood pressure, respiratory rate,  EKG, adequacy       of pulmonary ventilation, and response to care. Recommendation:           - Patient has a contact number available for                            emergencies. The signs and symptoms of potential                            delayed complications were discussed with the                            patient. Return to normal activities tomorrow.                            Written discharge instructions were provided to the                            patient.                           - Advance diet as tolerated. Follow-up on pathology.                           - Continue present medications. Begin Protonix 40                            mg once daily 30 minutes before breakfast.                           - Return to my office in 6 weeks.                           -Patient urged to see urologist for gross                            hematuria. He may or may not need a small bowel  capsule study. Procedure Code(s):        --- Professional ---                           [phone], Esophagogastroduodenoscopy, flexible,                            transoral; diagnostic, including collection of                            specimen(s) by brushing or washing, when performed                            (separate procedure) Diagnosis Code(s):        --- Professional ---                           K22.2, Esophageal obstruction                           K44.9, Diaphragmatic hernia without obstruction or                            gangrene                           D50.9, Iron deficiency anemia, unspecified CPT copyright 2022 American Medical Association. All rights reserved. The codes documented in this report are preliminary and upon coder review may  be revised to meet current compliance requirements. Gerrit Friends. Kazimir Hartnett, MD Gennette Pac, MD 09/23/2023 11:03:41 AM This report has been signed electronically. Number of Addenda: 0

## 2023-09-25 LAB — SURGICAL PATHOLOGY

## 2023-09-26 ENCOUNTER — Encounter: Payer: Self-pay | Admitting: Internal Medicine

## 2023-09-28 ENCOUNTER — Other Ambulatory Visit: Payer: Self-pay

## 2023-09-28 MED ORDER — BISMUTH/METRONIDAZ/TETRACYCLIN 140-125-125 MG PO CAPS: 3.0000 | ORAL_CAPSULE | Freq: Four times a day (QID) | ORAL | 0 refills | Status: DC

## 2023-10-01 ENCOUNTER — Telehealth: Payer: Self-pay

## 2023-10-01 ENCOUNTER — Encounter (HOSPITAL_COMMUNITY): Payer: Self-pay | Admitting: Internal Medicine

## 2023-10-01 NOTE — Telephone Encounter (Signed)
Hydrocodone-Acetaminophen 7.5/325 MG Qty 110 Tablets  PATIENT USES [facility]

## 2023-10-02 MED ORDER — HYDROCODONE-ACETAMINOPHEN 7.5-325 MG PO TABS: ORAL_TABLET | ORAL | 0 refills | Status: DC

## 2023-10-13 ENCOUNTER — Encounter: Payer: Self-pay | Admitting: Gastroenterology

## 2023-10-13 ENCOUNTER — Encounter: Payer: Self-pay | Admitting: Family Medicine

## 2023-10-19 ENCOUNTER — Other Ambulatory Visit: Payer: Self-pay | Admitting: Family Medicine

## 2023-10-28 ENCOUNTER — Other Ambulatory Visit: Payer: Self-pay | Admitting: Family Medicine

## 2023-10-28 DIAGNOSIS — I10 Essential (primary) hypertension: Secondary | ICD-10-CM

## 2023-10-29 ENCOUNTER — Other Ambulatory Visit: Payer: Self-pay

## 2023-10-29 ENCOUNTER — Telehealth: Payer: Self-pay | Admitting: Family Medicine

## 2023-10-29 MED ORDER — FLUTICASONE FUROATE-VILANTEROL 200-25 MCG/ACT IN AEPB: 1.0000 | INHALATION_SPRAY | Freq: Every day | RESPIRATORY_TRACT | 5 refills | Status: DC

## 2023-10-29 NOTE — Telephone Encounter (Signed)
 Refill sent.

## 2023-10-29 NOTE — Telephone Encounter (Signed)
 Patient came by office needs refill  BREO ELLIPTA 200-25 MCG/ACT AEPB [[id number]]   Pharmacy: [facility]

## 2023-10-29 NOTE — Telephone Encounter (Signed)
Sent to [facility] 

## 2023-10-29 NOTE — Telephone Encounter (Signed)
 Can this be sent to [name]   fluticasone furoate-vilanterol (BREO ELLIPTA) 200-25 MCG/ACT AEPB [[id number]]

## 2023-11-02 ENCOUNTER — Telehealth: Payer: Self-pay | Admitting: Orthopaedic Surgery

## 2023-11-02 NOTE — Telephone Encounter (Signed)
 DR. Hilda Lias   Patient came in the office wanting his pain medicine called in.  HYDROcodone-acetaminophen (NORCO) 7.5-325 MG tablet   [facility]

## 2023-11-03 MED ORDER — HYDROCODONE-ACETAMINOPHEN 7.5-325 MG PO TABS: ORAL_TABLET | ORAL | 0 refills | Status: DC

## 2023-11-11 ENCOUNTER — Ambulatory Visit: Payer: Medicare HMO | Admitting: Orthopaedic Surgery

## 2023-11-11 ENCOUNTER — Encounter: Payer: Self-pay | Admitting: Orthopaedic Surgery

## 2023-11-11 VITALS — BP 163/83 | HR 102 | Ht 75.5 in | Wt 155.0 lb

## 2023-11-11 DIAGNOSIS — M5442 Lumbago with sciatica, left side: Secondary | ICD-10-CM | POA: Diagnosis not present

## 2023-11-11 DIAGNOSIS — F17210 Nicotine dependence, cigarettes, uncomplicated: Secondary | ICD-10-CM | POA: Diagnosis not present

## 2023-11-11 DIAGNOSIS — G8929 Other chronic pain: Secondary | ICD-10-CM | POA: Diagnosis not present

## 2023-11-11 NOTE — Progress Notes (Signed)
 My back is tender.  He has more back pain with the cold weather.  He has no new trauma, no paresthesias, no weakness.  His right hand is doing well.  Spine/Pelvis examination:  Inspection:  Overall, sacoiliac joint benign and hips nontender; without crepitus or defects.   Thoracic spine inspection: Alignment normal without kyphosis present   Lumbar spine inspection:  Alignment  with normal lumbar lordosis, without scoliosis apparent.   Thoracic spine palpation:  without tenderness of spinal processes   Lumbar spine palpation: without tenderness of lumbar area; without tightness of lumbar muscles    Range of Motion:   Lumbar flexion, forward flexion is normal without pain or tenderness    Lumbar extension is full without pain or tenderness   Left lateral bend is normal without pain or tenderness   Right lateral bend is normal without pain or tenderness   Straight leg raising is normal  Strength & tone: normal   Stability overall normal stability  Right hand has full ROM and no pain.  Encounter Diagnoses  Name Primary?   Chronic midline low back pain with left-sided sciatica Yes   Cigarette nicotine  dependence without complication    Continue medicine.  Return in three months.  Continue exercises.  Call if any problem.  Precautions discussed.  Electronically Signed Pleasant Brilliant, MD 1/15/20258:13 AM

## 2023-11-17 ENCOUNTER — Encounter: Payer: Self-pay | Admitting: Student

## 2023-11-17 ENCOUNTER — Ambulatory Visit: Payer: Medicare HMO | Attending: Student | Admitting: Student

## 2023-11-17 ENCOUNTER — Other Ambulatory Visit: Payer: Self-pay | Admitting: *Deleted

## 2023-11-17 VITALS — BP 134/84 | HR 108 | Ht 73.5 in | Wt 152.4 lb

## 2023-11-17 DIAGNOSIS — I739 Peripheral vascular disease, unspecified: Secondary | ICD-10-CM | POA: Diagnosis not present

## 2023-11-17 DIAGNOSIS — R000 Tachycardia, unspecified: Secondary | ICD-10-CM

## 2023-11-17 DIAGNOSIS — E785 Hyperlipidemia, unspecified: Secondary | ICD-10-CM | POA: Diagnosis not present

## 2023-11-17 DIAGNOSIS — R0609 Other forms of dyspnea: Secondary | ICD-10-CM

## 2023-11-17 DIAGNOSIS — I2510 Atherosclerotic heart disease of native coronary artery without angina pectoris: Secondary | ICD-10-CM

## 2023-11-17 DIAGNOSIS — D649 Anemia, unspecified: Secondary | ICD-10-CM

## 2023-11-17 DIAGNOSIS — I10 Essential (primary) hypertension: Secondary | ICD-10-CM

## 2023-11-17 DIAGNOSIS — I6523 Occlusion and stenosis of bilateral carotid arteries: Secondary | ICD-10-CM

## 2023-11-17 NOTE — Progress Notes (Signed)
Cardiology Office Note    Date:  11/17/2023  ID:  AYTHAN KYLLO, DOB 1951-05-01, MRN [id number] Cardiologist: Dina Rich, MD    History of Present Illness:    Dillon [name] is a 73 y.o. male with past medical history of coronary calcification noted on CT (low-risk NST in 11/2022), HTN, HLD, PAD, COPD, infrarenal abdominal aortic aneurysm and carotid artery stenosis who presents to the office today for 101-month follow-up.  He was examined by myself in 03/2023 and reported that his shortness of breath had improved since being switched to [facility]. He was mowing over 13 yards per week and denied any recent chest pain or dyspnea on exertion with this. He was using baking soda for episodes of acid reflux at night and reviewed PPI therapy but he declined at that time. Follow-up carotid dopplers were recommended given the timeframe since last evaluation and these showed 50 to 69% plaque along the RICA and no significant stenosis along the LICA.  In talking with the patient today, he reports overall feeling well since his last office visit. While he is not currently mowing yards, he has been gathering wood and chops this routinely without any recent chest pain or progressive dyspnea on exertion. Does have intermittent dyspnea which occurs sporadically but no progression of symptoms. No recent orthopnea, PND or pitting edema. He is tachycardic today but denies any associated palpitations.   Studies Reviewed:   EKG: EKG is ordered today and demonstrates:   EKG Interpretation Date/Time:  Tuesday November 17 2023 15:49:11 EST Ventricular Rate:  108 PR Interval:  142 QRS Duration:  68 QT Interval:  312 QTC Calculation: 418 R Axis:   81  Text Interpretation: Sinus tachycardia Minimal voltage criteria for LVH, may be normal variant ( Sokolow-Lyon ) When compared with ECG of 18-Sep-2023 10:57, No significant change was found Confirmed by Randall An ([id number]) on 11/17/2023 4:20:31 PM        Echocardiogram: 11/2022 IMPRESSIONS     1. Left ventricular ejection fraction, by estimation, is 60 to 65%. The  left ventricle has normal function. The left ventricle has no regional  wall motion abnormalities. Left ventricular diastolic parameters were  normal.   2. Right ventricular systolic function was not well visualized. The right  ventricular size is normal. Tricuspid regurgitation signal is inadequate  for assessing PA pressure.   3. The mitral valve is abnormal. No evidence of mitral valve  regurgitation. No evidence of mitral stenosis.   4. The aortic valve is tricuspid. There is moderate calcification of the  aortic valve. Aortic valve regurgitation is not visualized. No aortic  stenosis is present.   5. The inferior vena cava is normal in size with greater than 50%  respiratory variability, suggesting right atrial pressure of 3 mmHg.   Comparison(s): No prior Echocardiogram.   NST: 11/2022   Findings are consistent with prior inferoseptal infarction. There is no current ischemia The study is low risk.   No ST deviation was noted.   LV perfusion is abnormal. Moderate size mild to moderate intensity fixed inferoseptal defect   Left ventricular function is normal. Nuclear stress EF: 61 %. The left ventricular ejection fraction is normal (55-65%). End diastolic cavity size is normal.    Carotid Dopplers: 03/2023 IMPRESSION: 1. Moderate-to-large amount of right-sided atherosclerotic plaque results in elevated peak systolic velocities of the right internal carotid artery compatible with the 50-69% luminal narrowing range. Further evaluation with CTA could be performed as indicated. 2. Moderate amount  of left-sided atherosclerotic plaque, not resulting in a hemodynamically significant stenosis.   Physical Exam:   VS:  BP 134/84   Pulse (!) 108   Ht 6' 1.5" (1.867 m)   Wt 152 lb 6.4 oz (69.1 kg)   SpO2 97%   BMI 19.83 kg/m    Wt Readings from Last 3 Encounters:   11/17/23 152 lb 6.4 oz (69.1 kg)  11/11/23 155 lb (70.3 kg)  09/23/23 153 lb 10.6 oz (69.7 kg)     GEN: Well nourished, well developed male appearing in no acute distress NECK: No JVD; No carotid bruits CARDIAC: Regular rhythm, tachycardiac rate, no murmurs, rubs, gallops RESPIRATORY:  Clear to auscultation without rales, wheezing or rhonchi  ABDOMEN: Appears non-distended. No obvious abdominal masses. EXTREMITIES: No clubbing or cyanosis. No pitting edema.  Distal pedal pulses are 2+ bilaterally.   Assessment and Plan:   1. Coronary Calcification by CT/History of Chest Pain - He previously underwent ischemic evaluation in 11/2022 and NST at that time was low-risk. He has intermittent dyspnea but denies any progression of symptoms and no recent chest pain. Continue current medical therapy with ASA 81mg  daily and Atorvastatin 80mg  daily.   2. PAD - Followed by Vascular Surgery and has known SFA disease. He denies any recent claudication symptoms. Remains on Pletal 100mg  BID. Also followed by Vascular for his infrarenal abdominal aortic aneurysm.   3. Carotid Artery Stenosis - Dopplers in 03/2023 showed 50 to 69% stenosis along the RICA. Would anticipate arranging repeat imaging at the time of his next office visit. Continue ASA and statin therapy.   4. HLD - His LDL was at 101 in 04/2023. He is scheduled for follow-up labs next month and if LDL remains above goal, would switch Atorvastatin 80mg  daily to Crestor 40mg  daily.  5. HTN - BP is well-controlled at 134/84 during today's visit. Continue current medical therapy with Amlodipine 10mg  daily and Spironolactone 50mg  daily.   6. Tachycardia/History of Anemia - Noted to be in sinus tachycardia today as confirmed by EKG. Denies any associated symptoms and no significant caffeine intake. No hypoxia. Given that this is new for him, will recheck labs to include CBC, BMET and TSH.   Signed, Ellsworth Lennox, PA-C

## 2023-11-17 NOTE — Patient Instructions (Addendum)
Medication Instructions:  Your physician recommends that you continue on your current medications as directed. Please refer to the Current Medication list given to you today.  Labwork: BMET, CBC & TSH today at [facility] Non-fasting  Testing/Procedures: none  Follow-Up: Your physician recommends that you schedule a follow-up appointment in: 6 months  Any Other Special Instructions Will Be Listed Below (If Applicable).  If you need a refill on your cardiac medications before your next appointment, please call your pharmacy.

## 2023-11-18 DIAGNOSIS — R000 Tachycardia, unspecified: Secondary | ICD-10-CM | POA: Diagnosis not present

## 2023-11-18 DIAGNOSIS — R069 Unspecified abnormalities of breathing: Secondary | ICD-10-CM | POA: Diagnosis not present

## 2023-11-18 DIAGNOSIS — D649 Anemia, unspecified: Secondary | ICD-10-CM | POA: Diagnosis not present

## 2023-11-20 ENCOUNTER — Telehealth: Payer: Self-pay | Admitting: Family Medicine

## 2023-11-20 NOTE — Telephone Encounter (Signed)
Patient came into the office and needs for Dr Lodema Hong to put him back on BREO ELLIPTA 200-25 MCG/ACT AEP  and send to [facility].  His insruance does not cover the fluticasone furoate-vilanterol (BREO ELLIPTA) 200-25 MCG/ACT AEPB  at [facility]

## 2023-11-24 ENCOUNTER — Other Ambulatory Visit: Payer: Self-pay | Admitting: Family Medicine

## 2023-11-24 NOTE — Telephone Encounter (Signed)
Copied from CRM [id number]. Topic: Clinical - Medication Refill >> Nov 24, 2023 12:16 PM Carlatta H wrote: Most Recent Primary Care Visit:  Provider: Kerri Perches  Department: [facility] C[facility]  Visit Type: OFFICE VISIT  Date: 07/15/2023  Medication: fluticasone furoate-vilanterol (BREO ELLIPTA) 200-25 MCG/ACT AEPB [[id number]]  Has the patient contacted their pharmacy? Yes (Agent: If no, request that the patient contact the pharmacy for the refill. If patient does not wish to contact the pharmacy document the reason why and proceed with request.) (Agent: If yes, when and what did the pharmacy advise?)  Is this the correct pharmacy for this prescription? Yes If no, delete pharmacy and type the correct one.  This is the patient's preferred pharmacy:   [id number] - [place], [place] - [address] AT [facility] [address] [place] [address] Phone: [phone] Fax: [phone]    Has the prescription been filled recently? No  Is the patient out of the medication? Yes  Has the patient been seen for an appointment in the last year OR does the patient have an upcoming appointment? Yes  Can we respond through MyChart? No  Agent: Please be advised that Rx refills may take up to 3 business days. We ask that you follow-up with your pharmacy.

## 2023-11-24 NOTE — Telephone Encounter (Signed)
Fluticasone- Vilanterol. Which are the generic version was picked up however it is now not covered by his insurance. CA stated we could attempt to file a PA for the [facility] ,or have pt. Contact his insurance to see what other inhalers in that medical base they are willing to cover and let us know so we can prescribe from there. Please advise on which route you prefer.

## 2023-11-26 ENCOUNTER — Other Ambulatory Visit: Payer: Self-pay | Admitting: Family Medicine

## 2023-11-26 NOTE — Telephone Encounter (Signed)
Copied from CRM [id number]. Topic: Clinical - Medication Refill >> Nov 26, 2023 12:31 PM Gildardo Pounds wrote: Most Recent Primary Care Visit:  Provider: Kerri Perches  Department: [facility] CARE  Visit Type: OFFICE VISIT  Date: 07/15/2023  Medication: fluticasone furoate-vilanterol (BREO ELLIPTA) 200-25 MCG/ACT AEPB  Has the patient contacted their pharmacy? Yes (Agent: If no, request that the patient contact the pharmacy for the refill. If patient does not wish to contact the pharmacy document the reason why and proceed with request.) (Agent: If yes, when and what did the pharmacy advise?)  Is this the correct pharmacy for this prescription? Yes If no, delete pharmacy and type the correct one.  This is the patient's preferred pharmacy:  [id number] - [place], [place] - [address] AT [facility] [address] [place] [address] Phone: [phone] Fax: [phone]     Has the prescription been filled recently? No  Is the patient out of the medication? Yes  Has the patient been seen for an appointment in the last year OR does the patient have an upcoming appointment? Yes  Can we respond through MyChart? No  Agent: Please be advised that Rx refills may take up to 3 business days. We ask that you follow-up with your pharmacy.

## 2023-12-02 ENCOUNTER — Telehealth: Payer: Self-pay

## 2023-12-02 NOTE — Telephone Encounter (Signed)
Hydrocodone-Acetaminophen 7.5/325 MG Qty 110 Tablets  PATIENT USES [facility]

## 2023-12-03 MED ORDER — HYDROCODONE-ACETAMINOPHEN 7.5-325 MG PO TABS: ORAL_TABLET | ORAL | 0 refills | Status: DC

## 2023-12-04 DIAGNOSIS — H401121 Primary open-angle glaucoma, left eye, mild stage: Secondary | ICD-10-CM | POA: Diagnosis not present

## 2023-12-04 DIAGNOSIS — H401113 Primary open-angle glaucoma, right eye, severe stage: Secondary | ICD-10-CM | POA: Diagnosis not present

## 2023-12-23 ENCOUNTER — Ambulatory Visit (INDEPENDENT_AMBULATORY_CARE_PROVIDER_SITE_OTHER): Payer: Medicare HMO | Admitting: Family Medicine

## 2023-12-23 ENCOUNTER — Encounter: Payer: Medicare HMO | Admitting: Family Medicine

## 2023-12-23 ENCOUNTER — Encounter: Payer: Self-pay | Admitting: Family Medicine

## 2023-12-23 ENCOUNTER — Other Ambulatory Visit: Payer: Self-pay

## 2023-12-23 VITALS — BP 133/79 | HR 92 | Resp 16 | Ht 74.0 in | Wt 152.8 lb

## 2023-12-23 DIAGNOSIS — E785 Hyperlipidemia, unspecified: Secondary | ICD-10-CM

## 2023-12-23 DIAGNOSIS — Z87891 Personal history of nicotine dependence: Secondary | ICD-10-CM

## 2023-12-23 DIAGNOSIS — E559 Vitamin D deficiency, unspecified: Secondary | ICD-10-CM

## 2023-12-23 DIAGNOSIS — R319 Hematuria, unspecified: Secondary | ICD-10-CM | POA: Insufficient documentation

## 2023-12-23 DIAGNOSIS — I10 Essential (primary) hypertension: Secondary | ICD-10-CM

## 2023-12-23 DIAGNOSIS — Z122 Encounter for screening for malignant neoplasm of respiratory organs: Secondary | ICD-10-CM

## 2023-12-23 DIAGNOSIS — R481 Agnosia: Secondary | ICD-10-CM | POA: Diagnosis not present

## 2023-12-23 DIAGNOSIS — R972 Elevated prostate specific antigen [PSA]: Secondary | ICD-10-CM | POA: Diagnosis not present

## 2023-12-23 DIAGNOSIS — R310 Gross hematuria: Secondary | ICD-10-CM

## 2023-12-23 DIAGNOSIS — F17210 Nicotine dependence, cigarettes, uncomplicated: Secondary | ICD-10-CM

## 2023-12-23 DIAGNOSIS — F17200 Nicotine dependence, unspecified, uncomplicated: Secondary | ICD-10-CM

## 2023-12-23 MED ORDER — TERBINAFINE HCL 250 MG PO TABS: 250.0000 mg | ORAL_TABLET | Freq: Every day | ORAL | 0 refills | Status: DC

## 2023-12-23 MED ORDER — CLOTRIMAZOLE-BETAMETHASONE 1-0.05 % EX CREA: 1.0000 | TOPICAL_CREAM | Freq: Two times a day (BID) | CUTANEOUS | 1 refills | Status: AC

## 2023-12-23 NOTE — Assessment & Plan Note (Signed)
 Hyperlipidemia:Low fat diet discussed and encouraged.   Lipid Panel  Lab Results  Component Value Date   CHOL 213 (H) 05/13/2023   HDL 97 05/13/2023   LDLCALC 101 (H) 05/13/2023   TRIG 66 05/13/2023   CHOLHDL 2.2 05/13/2023     Updated lab needed at/ before next visit.

## 2023-12-23 NOTE — Assessment & Plan Note (Signed)
 Approx 2 month history, refer ENT

## 2023-12-23 NOTE — Progress Notes (Signed)
 Dillon [name]     MRN: [id number]      DOB: Apr 30, 1951  Chief Complaint  Patient presents with   Rash    Has a very itchy rash on back. Slightly raised with a light silvery appearance. He has tried rubbing alcohol after his shower but nothing seems to help. Only on his back   No taste    Cannot taste his food. Can only taste hot sauce but its been going on for about a month and he doesn't know why    HPI Dillon [name] is here for follow up and re-evaluation of chronic medical conditions, medication management and review of any available recent lab and radiology data.  Preventive health is updated, specifically  Cancer screening and Immunization.   Questions or concerns regarding consultations or procedures which the PT has had in the interim are  addressed. The PT denies any adverse reactions to current medications since the last visit.  Specific concerns are as above   ROS Denies recent fever or chills. Denies sinus pressure, nasal congestion, ear pain or sore throat. Denies chest congestion, productive cough or wheezing. Denies chest pains, palpitations and leg swelling Denies abdominal pain, nausea, vomiting,diarrhea or constipation.   Dc/o blood in uriine Denies joint pain, swelling and limitation in mobility. Denies headaches, seizures, numbness, or tingling. Denies depression, anxiety or insomnia.  PE  BP 133/79   Pulse 92   Resp 16   Ht 6\' 2"  (1.88 m)   Wt 152 lb 12.8 oz (69.3 kg)   SpO2 97%   BMI 19.62 kg/m   Patient alert and oriented and in no cardiopulmonary distress.  HEENT: No facial asymmetry, EOMI,     Neck supple .  Chest: Clear to auscultation bilaterally.Decreased air entry  CVS: S1, S2 no murmurs, no S3.Regular rate.  ABD: Soft non tender.   Ext: No edema  MS: Decreased though Adequate ROM spine, shoulders, hips and knees.  Skin: Intact, no ulcerations or rash noted.  Psych: Good eye contact, normal affect. Memory intact not anxious or  depressed appearing.  CNS: CN 2-12 intact, power,  normal throughout.no focal deficits noted.   Assessment & Plan  Hyperlipidemia LDL goal <70 Hyperlipidemia:Low fat diet discussed and encouraged.   Lipid Panel  Lab Results  Component Value Date   CHOL 213 (H) 05/13/2023   HDL 97 05/13/2023   LDLCALC 101 (H) 05/13/2023   TRIG 66 05/13/2023   CHOLHDL 2.2 05/13/2023     Updated lab needed at/ before next visit.   NICOTINE ADDICTION Asked:confirms currently smokes cigarettes Assess: Unwilling to set a quit date, but is cutting back Advise: needs to QUIT to reduce risk of cancer, cardio and cerebrovascular disease Assist: counseled for 5 minutes and literature provided Arrange: follow up in 2 to 4 months Asked:confirms currently smokes cigarettes Assess: Unwilling to set a quit date, but is cutting back Advise: needs to QUIT to reduce risk of cancer, cardio and cerebrovascular disease Assist: counseled for 5 minutes and literature provided Arrange: follow up in 2 to 4 months   Hematuria Intermittent x approx 8 months refer Urology  Loss of perception for taste Approx 2 month history, refer ENT  Essential hypertension DASH diet and commitment to daily physical activity for a minimum of 30 minutes discussed and encouraged, as a part of hypertension management. The importance of attaining a healthy weight is also discussed. Controlled, no change in medication      12/31/2023    2:33 PM  12/31/2023    2:31 PM 12/23/2023    2:49 PM 11/17/2023    3:39 PM 11/17/2023    3:10 PM 11/17/2023    3:03 PM 11/11/2023    8:06 AM  BP/Weight  Systolic BP 146 142 133 134 146 144 163  Diastolic BP 81 80 79 84 82 84 83  Wt. (Lbs)  154 152.8   152.4 155  BMI  19.77 kg/m2 19.62 kg/m2   19.83 kg/m2 19.12 kg/m2

## 2023-12-23 NOTE — Assessment & Plan Note (Addendum)
 Asked:confirms currently smokes cigarettes Assess: Unwilling to set a quit date, but is cutting back Advise: needs to QUIT to reduce risk of cancer, cardio and cerebrovascular disease Assist: counseled for 5 minutes and literature provided Arrange: follow up in 2 to 4 months Asked:confirms currently smokes cigarettes Assess: Unwilling to set a quit date, but is cutting back Advise: needs to QUIT to reduce risk of cancer, cardio and cerebrovascular disease Assist: counseled for 5 minutes and literature provided Arrange: follow up in 2 to 4 months

## 2023-12-23 NOTE — Patient Instructions (Addendum)
 F/U in 8 to 10 weeks , call if you need me sooner  CCUA today   You are referred to Urology and to ENT  Also for lung cancer screening  Fasting labs this week please, lipid, cmp and eGFr, , Vit d and PSA total and free ( nurse pl order)  Terbinafine tablets and clotrimazole/ betameth cream are prescribed fr the rash   Thanks for choosing [facility], we consider it a privelige to serve you.

## 2023-12-23 NOTE — Assessment & Plan Note (Addendum)
 Intermittent x approx 8 months refer Urology

## 2023-12-28 ENCOUNTER — Ambulatory Visit: Payer: Medicare HMO | Admitting: Gastroenterology

## 2023-12-31 ENCOUNTER — Telehealth: Payer: Self-pay

## 2023-12-31 ENCOUNTER — Ambulatory Visit (INDEPENDENT_AMBULATORY_CARE_PROVIDER_SITE_OTHER): Admitting: Gastroenterology

## 2023-12-31 ENCOUNTER — Encounter: Payer: Self-pay | Admitting: Gastroenterology

## 2023-12-31 VITALS — BP 146/81 | HR 85 | Temp 98.8°F | Ht 74.0 in | Wt 154.0 lb

## 2023-12-31 DIAGNOSIS — Z8719 Personal history of other diseases of the digestive system: Secondary | ICD-10-CM

## 2023-12-31 DIAGNOSIS — E785 Hyperlipidemia, unspecified: Secondary | ICD-10-CM

## 2023-12-31 DIAGNOSIS — R1031 Right lower quadrant pain: Secondary | ICD-10-CM | POA: Diagnosis not present

## 2023-12-31 DIAGNOSIS — K2100 Gastro-esophageal reflux disease with esophagitis, without bleeding: Secondary | ICD-10-CM

## 2023-12-31 DIAGNOSIS — R438 Other disturbances of smell and taste: Secondary | ICD-10-CM

## 2023-12-31 DIAGNOSIS — R350 Frequency of micturition: Secondary | ICD-10-CM

## 2023-12-31 DIAGNOSIS — Z8619 Personal history of other infectious and parasitic diseases: Secondary | ICD-10-CM | POA: Diagnosis not present

## 2023-12-31 DIAGNOSIS — Z860101 Personal history of adenomatous and serrated colon polyps: Secondary | ICD-10-CM

## 2023-12-31 DIAGNOSIS — E559 Vitamin D deficiency, unspecified: Secondary | ICD-10-CM

## 2023-12-31 DIAGNOSIS — D509 Iron deficiency anemia, unspecified: Secondary | ICD-10-CM | POA: Diagnosis not present

## 2023-12-31 DIAGNOSIS — N401 Enlarged prostate with lower urinary tract symptoms: Secondary | ICD-10-CM

## 2023-12-31 DIAGNOSIS — K2970 Gastritis, unspecified, without bleeding: Secondary | ICD-10-CM | POA: Diagnosis not present

## 2023-12-31 DIAGNOSIS — B9681 Helicobacter pylori [H. pylori] as the cause of diseases classified elsewhere: Secondary | ICD-10-CM

## 2023-12-31 DIAGNOSIS — R972 Elevated prostate specific antigen [PSA]: Secondary | ICD-10-CM

## 2023-12-31 NOTE — Telephone Encounter (Signed)
Hydrocodone-Acetaminophen 7.5/325 MG Qty 110 Tablets  PATIENT USES [facility]

## 2023-12-31 NOTE — Progress Notes (Signed)
 GI Office Note    Referring Provider: Kerri Perches, MD Primary Care Physician:  Kerri Perches, MD Primary Gastroenterologist: Hennie Duos. Marletta Lor, DO  Date:  12/31/2023  ID:  Dillon [name], DOB [birth year], MRN [id number]  Chief Complaint   Chief Complaint  Patient presents with   h pylori    Follow up after finishing treatment for h pylori. Has concerns about not tasting food for the past 2 months. Concerns about pain in lower right side when he moves a certain way.    History of Present Illness  Dillon [name] is a 73 y.o. male with a history of COPD, glaucoma, arthritis, HTN, HLD, PVD, chronic tobacco use  presenting today for follow-up after H. pylori treatment with concerns about not tasting food and a right lower quadrant pain.  OV 09/16/2021.  Patient reported no prior colonoscopy and denies any family history of colon cancer.  Did admit to some mild constipation previously but controlled with stool softener once weekly.  No alarm symptoms present.  Did complain of some occasional RUQ discomfort not affected by bowel movements or meals.  Typically occurs when he coughs or bends a certain way with activity.  Occasionally uses NSAIDs for back pain and hydrocodone 2-3 days a week.  Has chronic shortness of breath with exertion.  Given his abdominal pain CT A/P with contrast ordered.  Patient to be scheduled for colonoscopy.   CT A/P 10/24/2021: -3.5 mm infrarenal abdominal aortic aneurysm, recommended follow-up ultrasound every 3 years -Moderate prostate enlargement -No acute abnormality seen in the abdomen or pelvis   Colonoscopy 11/04/2021: - Preparation of the colon was fair.  - Non- bleeding internal hemorrhoids.  - Three 6 to 8 mm polyps in the descending colon  - Two 3 to 4 mm polyps in the rectum. Polyp tissue not retrieved.  - The examination was otherwise normal. - Descending colon polyps revealed to be tubular adenoma and benign colonic mucosa - Repeat  colonoscopy in 5 years.    CT A/P 06/27/2023: -3.6 mm abdominal aortic aneurysm (recommended follow-up ultrasound every 2 years) -Prostate enlargement -No acute findings of the stomach, large bowel, small bowel, liver, pancreas, or spleen  Last office visit 08/17/23.  Taking iron over-the-counter daily for a month, still having low energy.  Sometimes dizzy or lightheaded.  Denied any syncope or presyncope.  Denied any rectal bleeding.  Occasional lower abdominal pain usually related to bowel habits.  Does report a daily bowel movement.  Does admit to occasional alcohol use, daily cigarette use.  Does take Advil as needed for headache/back pain but is fairly rare.  Denied any other alarm symptoms.  Schedule patient for EGD, consider Givens capsule if no etiology found for IDA.  Check labs including CBC, iron, and celiac labs.  Follow-up in 3 months.  EGD 09/23/2023: -Moderate Schatzki's ring (not manipulated) -Erosive reflux esophagitis -Medium size hiatal hernia with Sheria Lang erosions, distal gastric erosions also present s/p biopsy -Duodenal erosions, otherwise normal-appearing bulb and second portion. -Patient urged to see urology for gross hematuria -Advised to begin Protonix 40 mg once daily, follow-up in 6 weeks. (May or may not need small bowel capsule study) -Pathology with gastric antral and oxyntic mucosa with present H. pylori gastritis. (Pylera sent in for patient)     Latest Ref Rng & Units 07/13/2023   10:21 AM 06/27/2023    4:31 PM 02/03/2023    8:56 AM  CBC  WBC 3.4 - 10.8 x10E3/uL 4.6  22.0  4.3   Hemoglobin 13.0 - 17.7 g/dL 9.1  9.5  16.1   Hematocrit 37.5 - 51.0 % 28.6  28.9  34.5   Platelets 150 - 450 x10E3/uL 638  388  439    Iron/TIBC/Ferritin/ %Sat    Component Value Date/Time   IRON 12 (L) 07/13/2023 1021   FERRITIN 37 07/13/2023 1021    Today:  About 1 -1.5 months ago he saw about 1.5 days of bleeding but was not a lot It was in the commode nand nothgin on the  toilet tissues.    No chest pain or shortness of breath, restless leg, rare swelling in lower extremities. No othe rabodninal.   No N/V, dysphagia, believes with medication his reflux is better controlled.    Pain in his right side has been going on for a hwil but notices lately that when he went to sneeze he felt it and then it went away. When sitting in tthe chair wathcin tv he will leabn back and cough and doesn't bother him. Pain does not last long either.   Does not matter what he eats he feels like he can't taste it. He doesn't think he tastes hot sauce either but does not really l taste it. Not really able to taste sweet or salty.   Had pneumonia in August last year and tate issues has been for 2-3 months.   Does have seasonal allergies November.   Reviewed labs from January performed at [facility], hemoglobin improved to 11.8.  Wt Readings from Last 3 Encounters:  12/31/23 154 lb (69.9 kg)  12/23/23 152 lb 12.8 oz (69.3 kg)  11/17/23 152 lb 6.4 oz (69.1 kg)    Current Outpatient Medications  Medication Sig Dispense Refill   amLODipine (NORVASC) 10 MG tablet TAKE 1 TABLET BY MOUTH EVERY DAY 90 tablet 1   aspirin EC 81 MG tablet Take 81 mg by mouth daily.     atorvastatin (LIPITOR) 80 MG tablet TAKE 1 TABLET BY MOUTH EVERY DAY 90 tablet 3   cilostazol (PLETAL) 100 MG tablet Take 1 tablet (100 mg total) by mouth 2 (two) times daily before a meal. 60 tablet 11   clotrimazole-betamethasone (LOTRISONE) cream Apply 1 Application topically 2 (two) times daily. 45 g 1   fluticasone furoate-vilanterol (BREO ELLIPTA) 200-25 MCG/ACT AEPB Inhale 1 puff into the lungs daily. 60 each 5   HYDROcodone-acetaminophen (NORCO) 7.5-325 MG tablet One tablet every six hours as need for pain.  Must last 28 days. 110 tablet 0   latanoprost (XALATAN) 0.005 % ophthalmic solution 1 drop daily.     pantoprazole (PROTONIX) 40 MG tablet Take 1 tablet (40 mg total) by mouth daily. 30 tablet 11   rosuvastatin  (CRESTOR) 40 MG tablet Take 1 tablet (40 mg total) by mouth daily. 90 tablet 3   spironolactone (ALDACTONE) 50 MG tablet TAKE 1 TABLET BY MOUTH EVERY DAY 90 tablet 1   terbinafine (LAMISIL) 250 MG tablet Take 1 tablet (250 mg total) by mouth daily. 14 tablet 0   UNABLE TO FIND Nebulizer facemask and tubing x 1  DX J44.9 1 each 0   No current facility-administered medications for this visit.    Past Medical History:  Diagnosis Date   Arthritis    2012   Asthma    childhood   Carotid artery stenosis    COPD (chronic obstructive pulmonary disease) (HCC)    Glaucoma    Hyperlipidemia    1990   Hypertension    1990  Nicotine addiction    PAD (peripheral artery disease) (HCC) 2015    Past Surgical History:  Procedure Laterality Date   BIOPSY  09/23/2023   Procedure: BIOPSY;  Surgeon: Corbin Ade, MD;  [facility]: [facility];  Service: Endoscopy;;   COLONOSCOPY WITH PROPOFOL N/A 11/04/2021   Procedure: COLONOSCOPY WITH PROPOFOL;  Surgeon: Lanelle Bal, DO;  Location: [facility];  Service: Endoscopy;  Laterality: N/A;  9:30am   ESOPHAGOGASTRODUODENOSCOPY (EGD) WITH PROPOFOL N/A 09/23/2023   Procedure: ESOPHAGOGASTRODUODENOSCOPY (EGD) WITH PROPOFOL;  Surgeon: Corbin Ade, MD;  Location: [facility];  Service: Endoscopy;  Laterality: N/A;  130pm, asa 3   KNEE ARTHROSCOPY     both knees    LOWER EXTREMITY ANGIOGRAPHY N/A 02/04/2018   Procedure: LOWER EXTREMITY ANGIOGRAPHY;  Surgeon: Fransisco Hertz, MD;  Location: [facility] INVASIVE CV LAB;  Service: Cardiovascular;  Laterality: N/A;   POLYPECTOMY  11/04/2021   Procedure: POLYPECTOMY;  Surgeon: Lanelle Bal, DO;  Location: [facility];  Service: Endoscopy;;    Family History  Problem Relation Age of Onset   Heart attack Mother    Diabetes Mother    Hypertension Mother    Stroke Brother    Hypertension Sister    Hypertension Sister    Hypertension Sister    Hypertension Brother    Colon cancer Neg Hx      Allergies as of 12/31/2023 - Review Complete 12/31/2023  Allergen Reaction Noted   Penicillins Hives and Other (See Comments) 02/24/2008    Social History   Socioeconomic History   Marital status: Single    Spouse name: Not on file   Number of children: Not on file   Years of education: Not on file   Highest education level: Not on file  Occupational History   Not on file  Tobacco Use   Smoking status: Every Day    Current packs/day: 0.25    Average packs/day: 0.3 packs/day for 54.2 years (13.6 ttl pk-yrs)    Types: Cigarettes    Start date: 10/16/1969    Passive exposure: Never   Smokeless tobacco: Never  Vaping Use   Vaping status: Never Used  Substance and Sexual Activity   Alcohol use: Yes    Comment: occassional, max of 72 oz pwr month   Drug use: No   Sexual activity: Yes    Partners: Female  Other Topics Concern   Not on file  Social History Narrative   Not on file   Social Drivers of Health   Financial Resource Strain: Low Risk  (05/25/2023)   Overall Financial Resource Strain (CARDIA)    Difficulty of Paying Living Expenses: Not hard at all  Food Insecurity: No Food Insecurity (05/25/2023)   Hunger Vital Sign    Worried About Running Out of Food in the Last Year: Never true    Ran Out of Food in the Last Year: Never true  Transportation Needs: No Transportation Needs (05/25/2023)   PRAPARE - Administrator, Civil Service (Medical): No    Lack of Transportation (Non-Medical): No  Physical Activity: Sufficiently Active (05/25/2023)   Exercise Vital Sign    Days of Exercise per Week: 7 days    Minutes of Exercise per Session: 30 min  Stress: No Stress Concern Present (05/25/2023)   [employer] of Occupational Health - Occupational Stress Questionnaire    Feeling of Stress : Not at all  Social Connections: Moderately Isolated (05/25/2023)   Social Connection and  Isolation Panel [NHANES]    Frequency of Communication with Friends and  Family: More than three times a week    Frequency of Social Gatherings with Friends and Family: More than three times a week    Attends Religious Services: 1 to 4 times per year    Active Member of [employer] or Organizations: No    Attends Banker Meetings: Never    Marital Status: Never married   Review of Systems   Gen: Denies fever, chills, anorexia. Denies fatigue, weakness, weight loss.  CV: Denies chest pain, palpitations, syncope, peripheral edema, and claudication. Resp: Denies dyspnea at rest, cough, wheezing, coughing up blood, and pleurisy. GI: See HPI Derm: Denies rash, itching, dry skin Psych: Denies depression, anxiety, memory loss, confusion. No homicidal or suicidal ideation.  Heme: Denies bruising, bleeding, and enlarged lymph nodes.  Physical Exam   BP (!) 146/81   Pulse 85   Temp 98.8 F (37.1 C)   Ht 6\' 2"  (1.88 m)   Wt 154 lb (69.9 kg)   BMI 19.77 kg/m   General:   Alert and oriented. No distress noted. Pleasant and cooperative.  Head:  Normocephalic and atraumatic. Eyes:  Conjuctiva clear without scleral icterus. Mouth:  Oral mucosa pink and moist. Good dentition. No lesions.  Abdomen:  +BS, soft, non-tender and non-distended. No rebound or guarding. No HSM or masses noted. Rectal: deferred Msk:  Symmetrical without gross deformities. Normal posture. Extremities:  Without edema. Neurologic:  Alert and  oriented x4 Psych:  Alert and cooperative. Normal mood and affect.  Assessment  Dillon [name] is a 73 y.o. male with a history of COPD, glaucoma, arthritis, HTN, HLD, PVD, chronic tobacco use  presenting today for follow-up after H. pylori treatment with concerns about not tasting food and a right lower quadrant pain.  IDA: Drop in hemoglobin last year from 11.2-9.1.  Also had low iron at 12 and ferritin 37 with normal B12 and folate.  Colonoscopy in October 28, 2021 with 5 polyps removed revealed to be tube adenomas and benign mucosal tissue, due  for surveillance in 2028.  Continues to not have any rectal bleeding.  Recent EGD with evidence of duodenal as well as gastric erosions, gastric erosions likely secondary to medium size hiatal hernia.  This could be enough to explain some of his anemia in the setting of his alcohol and tobacco use.  Hemoglobin has improved to 11.8 as of January.  Will recheck CBC and iron panel today to ensure her iron levels have also increased given he is not currently on any supplementation.  No alarm symptoms present.  As long as hemoglobin remained stable, will not pursue Givens capsule at this time.  Will likely need long-term PPI, may attempt to wean back to once daily dosing in the near future.  GERD, H. Pylori: Reflux has been well-controlled with pantoprazole 40 mg twice daily.  EGD in November with moderate Schatzki's ring, erosive reflux esophagitis, medium size hiatal hernia with Sheria Lang erosions as well as duodenal erosions.  H. pylori present on pathology.  Will continue pantoprazole 40 mg twice daily however need to assess for H. pylori eradication therefore we will hold for 2 weeks and then resume after breath testing performed.  Advised famotidine and Tums/Maalox as needed for relief of reflux during that time.  RLQ pain: Suspect MSK in nature. Pain is short in duration and mostly occurs with coughing or sneezing. No obvious deformity or hernia palpated on abdominal exam.  Hypogeusia: Etiology unclear at this time.  Advised daily allergy medication and starting zinc supplement. Could have been from viral or bacteria exposure or secondary to allergies even though smell is intact.   PLAN   Capsule study if ongoing IDA H. Pylori breath test, CBC, iron panel ([facility]) Zinc supplement daily for treatment of taste disorder and daily allergy medication.  Stop pantoprazole for 2 weeks and then perform H. pylori breath test May take famotidine 1-2 times daily as needed for reflux while off PPI for 2  weeks Reordered patients labs from PCP to have done at [facility] per his request.  Follow up in 3 months.    Brooke Bonito, MSN, FNP-BC, AGACNP-BC [facility]

## 2023-12-31 NOTE — Patient Instructions (Addendum)
 For issues with taste: Start  over-the-counter zinc supplement once daily. Also take a nondrowsy daily allergy medication such as Claritin or Zyrtec, you can take this nightly or in the morning it does not matter. We will trial these medications for 1 month and reassess.  Please have labs performed at [facility].  I will provide you my lab slips along with lab slips for Ms. Simpson that she requested, she prefers for these to be done on empty stomach.  We need to be sure that the H. pylori bacteria is gone and adequately treated with prior antibiotics therefore tomorrow I want you to stop taking your pantoprazole for 2 weeks.  On 3/21 or after you may go to [facility] and perform the breath test.  After you do the breath test you may resume your pantoprazole.  If you are experiencing reflux symptoms while being off of pantoprazole, please start famotidine (Pepcid) 1-2 times daily.  If you also need something else for quick relief, you may use Tums or Maalox.  Please to avoid any Maalox or Tums the day prior to doing your breath test.  You should not eat or drink anything at least 1 hour prior to doing your breath test.  We will follow-up in 3 months, sooner if needed.  It was a pleasure to see you today. I want to create trusting relationships with patients. If you receive a survey regarding your visit,  I greatly appreciate you taking time to fill this out on paper or through your MyChart. I value your feedback.  Brooke Bonito, MSN, FNP-BC, AGACNP-BC [facility]

## 2024-01-01 MED ORDER — HYDROCODONE-ACETAMINOPHEN 7.5-325 MG PO TABS
ORAL_TABLET | ORAL | 0 refills | Status: DC
Start: 2024-01-01 — End: 2024-02-02

## 2024-01-10 ENCOUNTER — Encounter: Payer: Self-pay | Admitting: Family Medicine

## 2024-01-10 NOTE — Assessment & Plan Note (Addendum)
 DASH diet and commitment to daily physical activity for a minimum of 30 minutes discussed and encouraged, as a part of hypertension management. The importance of attaining a healthy weight is also discussed. Controlled, no change in medication      12/31/2023    2:33 PM 12/31/2023    2:31 PM 12/23/2023    2:49 PM 11/17/2023    3:39 PM 11/17/2023    3:10 PM 11/17/2023    3:03 PM 11/11/2023    8:06 AM  BP/Weight  Systolic BP 146 142 133 134 146 144 163  Diastolic BP 81 80 79 84 82 84 83  Wt. (Lbs)  154 152.8   152.4 155  BMI  19.77 kg/m2 19.62 kg/m2   19.83 kg/m2 19.12 kg/m2

## 2024-01-10 NOTE — Assessment & Plan Note (Signed)
 Updated lab needed at/ before next visit.

## 2024-01-15 DIAGNOSIS — R972 Elevated prostate specific antigen [PSA]: Secondary | ICD-10-CM | POA: Diagnosis not present

## 2024-01-15 DIAGNOSIS — R350 Frequency of micturition: Secondary | ICD-10-CM | POA: Diagnosis not present

## 2024-01-15 DIAGNOSIS — B9681 Helicobacter pylori [H. pylori] as the cause of diseases classified elsewhere: Secondary | ICD-10-CM | POA: Diagnosis not present

## 2024-01-15 DIAGNOSIS — E785 Hyperlipidemia, unspecified: Secondary | ICD-10-CM | POA: Diagnosis not present

## 2024-01-15 DIAGNOSIS — E559 Vitamin D deficiency, unspecified: Secondary | ICD-10-CM | POA: Diagnosis not present

## 2024-01-15 DIAGNOSIS — K2970 Gastritis, unspecified, without bleeding: Secondary | ICD-10-CM | POA: Diagnosis not present

## 2024-01-15 DIAGNOSIS — D509 Iron deficiency anemia, unspecified: Secondary | ICD-10-CM | POA: Diagnosis not present

## 2024-01-16 ENCOUNTER — Ambulatory Visit (HOSPITAL_COMMUNITY)
Admission: RE | Admit: 2024-01-16 | Discharge: 2024-01-16 | Disposition: A | Discharge status: Home / Self Care | Payer: Medicare HMO | Source: Ambulatory Visit | Attending: Family Medicine | Admitting: Family Medicine

## 2024-01-16 DIAGNOSIS — Z87891 Personal history of nicotine dependence: Secondary | ICD-10-CM | POA: Diagnosis not present

## 2024-01-16 DIAGNOSIS — F17210 Nicotine dependence, cigarettes, uncomplicated: Secondary | ICD-10-CM | POA: Diagnosis not present

## 2024-01-16 DIAGNOSIS — Z122 Encounter for screening for malignant neoplasm of respiratory organs: Secondary | ICD-10-CM | POA: Insufficient documentation

## 2024-01-16 LAB — COMPREHENSIVE METABOLIC PANEL
AG Ratio: 1.4 (calc) (ref 1.0–2.5)
ALT: 10 U/L (ref 9–46)
AST: 23 U/L (ref 10–35)
Albumin: 4.4 g/dL (ref 3.6–5.1)
Alkaline phosphatase (APISO): 82 U/L (ref 35–144)
BUN: 9 mg/dL (ref 7–25)
CO2: 28 mmol/L (ref 20–32)
Calcium: 9.5 mg/dL (ref 8.6–10.3)
Chloride: 102 mmol/L (ref 98–110)
Creat: 0.96 mg/dL (ref 0.70–1.28)
Globulin: 3.2 g/dL (ref 1.9–3.7)
Glucose, Bld: 85 mg/dL (ref 65–99)
Potassium: 4.1 mmol/L (ref 3.5–5.3)
Sodium: 139 mmol/L (ref 135–146)
Total Bilirubin: 0.4 mg/dL (ref 0.2–1.2)
Total Protein: 7.6 g/dL (ref 6.1–8.1)
eGFR: 85 mL/min/{1.73_m2} (ref >=60)

## 2024-01-16 LAB — IRON,TIBC AND FERRITIN PANEL
%SAT: 12 % — ABNORMAL LOW (ref 20–48)
Ferritin: 50 ng/mL (ref 24–380)
Iron: 47 ug/dL — ABNORMAL LOW (ref 50–180)
TIBC: 388 ug/dL (ref 250–425)

## 2024-01-16 LAB — LIPID PANEL
Cholesterol: 182 mg/dL (ref <200)
HDL: 86 mg/dL (ref >=40)
LDL Cholesterol (Calc): 81 mg/dL
Non-HDL Cholesterol (Calc): 96 mg/dL (ref <130)
Total CHOL/HDL Ratio: 2.1 (calc) (ref <5.0)
Triglycerides: 74 mg/dL (ref <150)

## 2024-01-16 LAB — CBC
HCT: 37.8 % — ABNORMAL LOW (ref 38.5–50.0)
Hemoglobin: 12.7 g/dL — ABNORMAL LOW (ref 13.2–17.1)
MCH: 31.1 pg (ref 27.0–33.0)
MCHC: 33.6 g/dL (ref 32.0–36.0)
MCV: 92.6 fL (ref 80.0–100.0)
MPV: 8.5 fL (ref 7.5–12.5)
Platelets: 460 10*3/uL — ABNORMAL HIGH (ref 140–400)
RBC: 4.08 10*6/uL — ABNORMAL LOW (ref 4.20–5.80)
RDW: 14.2 % (ref 11.0–15.0)
WBC: 4 10*3/uL (ref 3.8–10.8)

## 2024-01-16 LAB — H. PYLORI BREATH TEST: H. pylori Breath Test: DETECTED — AB

## 2024-01-16 LAB — VITAMIN D 25 HYDROXY (VIT D DEFICIENCY, FRACTURES): Vit D, 25-Hydroxy: 14 ng/mL — ABNORMAL LOW (ref 30–100)

## 2024-01-16 LAB — PSA: PSA: 10.79 ng/mL — ABNORMAL HIGH (ref <=4.00)

## 2024-01-17 ENCOUNTER — Other Ambulatory Visit: Payer: Self-pay | Admitting: Gastroenterology

## 2024-01-18 ENCOUNTER — Other Ambulatory Visit: Payer: Self-pay | Admitting: Gastroenterology

## 2024-01-18 MED ORDER — PANTOPRAZOLE SODIUM 40 MG PO TBEC: 40.0000 mg | DELAYED_RELEASE_TABLET | Freq: Two times a day (BID) | ORAL | 1 refills | Status: DC

## 2024-01-18 MED ORDER — TETRACYCLINE HCL 500 MG PO CAPS
500.0000 mg | ORAL_CAPSULE | Freq: Four times a day (QID) | ORAL | 0 refills | Status: AC
Start: 2024-01-18 — End: 2024-02-01

## 2024-01-18 MED ORDER — METRONIDAZOLE 500 MG PO TABS: 500.0000 mg | ORAL_TABLET | Freq: Four times a day (QID) | ORAL | 0 refills | Status: AC

## 2024-01-18 MED ORDER — BISMUTH 262 MG PO CHEW: 2.0000 | CHEWABLE_TABLET | Freq: Four times a day (QID) | ORAL | 0 refills | Status: AC

## 2024-01-19 ENCOUNTER — Other Ambulatory Visit: Payer: Self-pay | Admitting: Gastroenterology

## 2024-01-19 NOTE — Telephone Encounter (Signed)
 Spoke to pharmacy, they are going to fill the tetracycline for 34$ with a discount card that is how much it will be.  Informed the pt of amount and how to take medications. He voiced understanding.

## 2024-01-20 ENCOUNTER — Other Ambulatory Visit: Payer: Self-pay | Admitting: Student

## 2024-01-25 ENCOUNTER — Ambulatory Visit: Payer: Medicare HMO | Admitting: Urology

## 2024-01-25 ENCOUNTER — Telehealth: Payer: Self-pay

## 2024-01-25 NOTE — Telephone Encounter (Signed)
 Pt called about rescheduled appt unable to lvm apointment rescheduled

## 2024-02-02 ENCOUNTER — Telehealth: Payer: Self-pay

## 2024-02-02 MED ORDER — HYDROCODONE-ACETAMINOPHEN 7.5-325 MG PO TABS: ORAL_TABLET | ORAL | 0 refills | Status: DC

## 2024-02-02 NOTE — Telephone Encounter (Signed)
 Hydrocodone-Acetaminophen 7.5/325 MG Qty 110 Tablets  Patient uses [facility]

## 2024-02-03 ENCOUNTER — Ambulatory Visit (INDEPENDENT_AMBULATORY_CARE_PROVIDER_SITE_OTHER): Admitting: Orthopaedic Surgery

## 2024-02-03 ENCOUNTER — Encounter: Payer: Self-pay | Admitting: Orthopaedic Surgery

## 2024-02-03 VITALS — BP 122/67 | HR 89 | Ht 73.5 in | Wt 151.0 lb

## 2024-02-03 DIAGNOSIS — M5442 Lumbago with sciatica, left side: Secondary | ICD-10-CM | POA: Diagnosis not present

## 2024-02-03 DIAGNOSIS — G8929 Other chronic pain: Secondary | ICD-10-CM

## 2024-02-03 DIAGNOSIS — F17210 Nicotine dependence, cigarettes, uncomplicated: Secondary | ICD-10-CM | POA: Diagnosis not present

## 2024-02-03 NOTE — Progress Notes (Signed)
 My back is sore.  He has good and bad days with the lower back.  MRI in January showed: IMPRESSION: Multilevel degenerative change throughout the lumbar spine is similar to 2016. There is mild progression of central disc protrusion at L4-5.   Moderate to severe foraminal stenosis bilaterally L4-5 and L5-S1 due to spurring.   NV intact today.  Muscle tone and strength is normal.  ROM is full. He has no spasm.  Encounter Diagnoses  Name Primary?   Chronic midline low back pain with left-sided sciatica Yes   Cigarette nicotine dependence without complication    I had called in pain medicine yesterday for him.  Return in three months.  Call if any problem.  Precautions discussed.  Electronically Signed Darreld Mclean, MD 4/9/20259:39 AM

## 2024-02-10 ENCOUNTER — Ambulatory Visit: Payer: Medicare HMO | Admitting: Orthopaedic Surgery

## 2024-02-15 ENCOUNTER — Telehealth: Payer: Self-pay | Admitting: Acute Care

## 2024-02-15 NOTE — Telephone Encounter (Signed)
 Call report:  IMPRESSION: 1. Waxing and waning areas of nodular consolidation, including a new 17.1 mm irregular nodular lesion in the posterior left upper lobe. Findings may be infectious/inflammatory in etiology. Malignancy cannot be excluded. Lung-RADS 4B, suspicious. Additional imaging evaluation or consultation with Pulmonology or Thoracic Surgery recommended. Consider short-term follow-up low-dose lung cancer screening CT in 4-8 weeks from today's study date, after appropriate clinical therapy, as clinically indicated. These results will be called to the ordering clinician or representative by the Radiologist Assistant, and communication documented in the PACS or [employer].

## 2024-02-17 ENCOUNTER — Other Ambulatory Visit: Payer: Self-pay | Admitting: Acute Care

## 2024-02-17 ENCOUNTER — Telehealth: Payer: Self-pay | Admitting: Acute Care

## 2024-02-17 NOTE — Telephone Encounter (Signed)
 I have called the patient with the results of the lung cancer screening scan done 01/16/2024 and read 02/12/2024. The scan was read as a LR 4 B, but radiology note there are waxing and waning nodules , including a new 17.1 mm left upper lobe nodule that they suspect may be infectious or inflammatory. Recommendation is for a 4=8 week follow up.  Pt. States he does not remember if her was sick a month ago when the scan was done. He has been on antibiotics for H. Pylori.  He denies any fever, discolored secretions or worsening shortness of breath from his baseline. I discussed with the patient that radiology recommended a 4 to 8-week follow-up.  We are currently at 4 weeks Plan will be for a follow-up low-dose CT scan in 2 weeks, I explained to him that we will call him with the results.  If the results continue to look inflammatory or infectious we will consider treatment.  If they do not then we will move forward with best options for treatment based on nodule size.  Lex [facility], and [place] please fax results to PCP let them know plan is for a 2-week follow-up scan to reassess the lung nodule. Please place order for follow-up low-dose screening CT due after Mar 02, 2024. Thank you so much.

## 2024-02-18 ENCOUNTER — Other Ambulatory Visit: Payer: Self-pay

## 2024-02-18 DIAGNOSIS — R911 Solitary pulmonary nodule: Secondary | ICD-10-CM

## 2024-02-18 DIAGNOSIS — Z87891 Personal history of nicotine dependence: Secondary | ICD-10-CM

## 2024-02-18 DIAGNOSIS — Z122 Encounter for screening for malignant neoplasm of respiratory organs: Secondary | ICD-10-CM

## 2024-02-18 NOTE — Telephone Encounter (Signed)
 I have called and spoke with the patient. He will call the office back to schedule repeat CT. He did not have his schedule with him at time of call. CT order has been placed. Results and plan to PCP. Reminder set to call patient if he does not call us  back within 2 days to schedule CT.

## 2024-02-25 ENCOUNTER — Other Ambulatory Visit: Payer: Self-pay | Admitting: Gastroenterology

## 2024-02-25 ENCOUNTER — Institutional Professional Consult (permissible substitution) (INDEPENDENT_AMBULATORY_CARE_PROVIDER_SITE_OTHER): Admitting: Otolaryngology

## 2024-02-25 ENCOUNTER — Ambulatory Visit: Payer: Medicare HMO | Admitting: Family Medicine

## 2024-02-29 ENCOUNTER — Telehealth: Payer: Self-pay

## 2024-02-29 MED ORDER — HYDROCODONE-ACETAMINOPHEN 7.5-325 MG PO TABS: ORAL_TABLET | ORAL | 0 refills | Status: DC

## 2024-02-29 NOTE — Telephone Encounter (Signed)
Hydrocodone-Acetaminophen 7.5/325 MG   Qty 110 Tablets  PATIENT USES [facility] [address]

## 2024-03-02 ENCOUNTER — Ambulatory Visit (HOSPITAL_COMMUNITY)
Admission: RE | Admit: 2024-03-02 | Discharge: 2024-03-02 | Disposition: A | Discharge status: Home / Self Care | Source: Ambulatory Visit | Attending: Family Medicine | Admitting: Family Medicine

## 2024-03-02 DIAGNOSIS — I2510 Atherosclerotic heart disease of native coronary artery without angina pectoris: Secondary | ICD-10-CM | POA: Insufficient documentation

## 2024-03-02 DIAGNOSIS — J439 Emphysema, unspecified: Secondary | ICD-10-CM | POA: Insufficient documentation

## 2024-03-02 DIAGNOSIS — Z122 Encounter for screening for malignant neoplasm of respiratory organs: Secondary | ICD-10-CM | POA: Insufficient documentation

## 2024-03-02 DIAGNOSIS — F17210 Nicotine dependence, cigarettes, uncomplicated: Secondary | ICD-10-CM | POA: Diagnosis not present

## 2024-03-02 DIAGNOSIS — Z87891 Personal history of nicotine dependence: Secondary | ICD-10-CM | POA: Insufficient documentation

## 2024-03-02 DIAGNOSIS — I700 Atherosclerosis of aorta: Secondary | ICD-10-CM | POA: Insufficient documentation

## 2024-03-02 DIAGNOSIS — R918 Other nonspecific abnormal finding of lung field: Secondary | ICD-10-CM | POA: Insufficient documentation

## 2024-03-11 ENCOUNTER — Other Ambulatory Visit: Payer: Self-pay | Admitting: *Deleted

## 2024-03-11 DIAGNOSIS — I714 Abdominal aortic aneurysm, without rupture, unspecified: Secondary | ICD-10-CM

## 2024-03-11 DIAGNOSIS — I739 Peripheral vascular disease, unspecified: Secondary | ICD-10-CM

## 2024-03-14 ENCOUNTER — Ambulatory Visit: Admitting: Urology

## 2024-03-14 VITALS — BP 128/83 | HR 111

## 2024-03-14 DIAGNOSIS — N401 Enlarged prostate with lower urinary tract symptoms: Secondary | ICD-10-CM | POA: Diagnosis not present

## 2024-03-14 DIAGNOSIS — R972 Elevated prostate specific antigen [PSA]: Secondary | ICD-10-CM | POA: Diagnosis not present

## 2024-03-14 DIAGNOSIS — R3912 Poor urinary stream: Secondary | ICD-10-CM | POA: Diagnosis not present

## 2024-03-14 DIAGNOSIS — R310 Gross hematuria: Secondary | ICD-10-CM

## 2024-03-14 DIAGNOSIS — N138 Other obstructive and reflux uropathy: Secondary | ICD-10-CM

## 2024-03-14 MED ORDER — FINASTERIDE 5 MG PO TABS: 5.0000 mg | ORAL_TABLET | Freq: Every day | ORAL | 3 refills | Status: AC

## 2024-03-14 NOTE — Progress Notes (Unsigned)
 03/14/2024 1:11 PM   Dillon [name] 01-20-1951 [id number]  Referring provider: Towanda Fret, MD [address] [place],  [place] [address]  No chief complaint on file.   HPI: New pt -   1) PSA elevation-patient had a PSA of 3.9 in 2014 which rose up to 10.4 by 2017.  PSA slightly lower in 2021 at 6.8.  His March 2025 PSA was 10.8, cr 0.96. August 2024 CT benign.  No LAD or bone lesion. Prostate is about 95 g with a small median lobe. No FH PCa. No biopsy.   2) BPH-prostate about 95 g on imaging with a small median lobe. No prostate meds. No surgery. IPSS 6.   Today, seen for the above. He voided and noted red urine Feb 2025. No clots. No recurrence. He is smoker.   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   PMH: Past Medical History:  Diagnosis Date   Arthritis    2012   Asthma    childhood   Carotid artery stenosis    COPD (chronic obstructive pulmonary disease) (HCC)    Glaucoma    Hyperlipidemia    1990   Hypertension    1990   Nicotine  addiction    PAD (peripheral artery disease) (HCC) 2015    Surgical History: Past Surgical History:  Procedure Laterality Date   BIOPSY  09/23/2023   Procedure: BIOPSY;  Surgeon: Suzette Espy, MD;  [facility];  Service: Endoscopy;;   COLONOSCOPY WITH PROPOFOL  N/A 11/04/2021   Procedure: COLONOSCOPY WITH PROPOFOL ;  Surgeon: Vinetta Greening, DO;  [facility];  Service: Endoscopy;  Laterality: N/A;  9:30am   ESOPHAGOGASTRODUODENOSCOPY (EGD) WITH PROPOFOL  N/A 09/23/2023   Procedure: ESOPHAGOGASTRODUODENOSCOPY (EGD) WITH PROPOFOL ;  Surgeon: Suzette Espy, MD;  [facility];  Service: Endoscopy;  Laterality: N/A;  130pm, asa 3   KNEE ARTHROSCOPY     both knees    LOWER EXTREMITY ANGIOGRAPHY N/A 02/04/2018   Procedure: LOWER EXTREMITY ANGIOGRAPHY;  Surgeon: Arvil Lauber, MD;  [facility];  Service: Cardiovascular;  Laterality: N/A;   POLYPECTOMY  11/04/2021    Procedure: POLYPECTOMY;  Surgeon: Vinetta Greening, DO;  [facility];  Service: Endoscopy;;    Home Medications:  Allergies as of 03/14/2024       Reactions   Penicillins Hives, Other (See Comments)   Has patient had a PCN reaction causing immediate rash, facial/tongue/throat swelling, SOB or lightheadedness with hypotension: Unknown Has patient had a PCN reaction causing severe rash involving mucus membranes or skin necrosis: No Has patient had a PCN reaction that required hospitalization: Yes Has patient had a PCN reaction occurring within the last 10 years: No If all of the above answers are "NO", then may proceed with Cephalosporin use.        Medication List        Accurate as of Mar 14, 2024  1:11 PM. If you have any questions, ask your nurse or doctor.          amLODipine  10 MG tablet Commonly known as: NORVASC  TAKE 1 TABLET BY MOUTH EVERY DAY   aspirin  EC 81 MG tablet Take 81 mg by mouth daily.   atorvastatin  80 MG tablet Commonly known as: LIPITOR TAKE 1 TABLET BY MOUTH EVERY DAY   cilostazol  100 MG tablet Commonly known as: PLETAL  Take 1 tablet (100 mg total) by mouth 2 (two) times daily before a  meal.   clotrimazole -betamethasone  cream Commonly known as: LOTRISONE  Apply 1 Application topically 2 (two) times daily.   fluticasone  furoate-vilanterol 200-25 MCG/ACT Aepb Commonly known as: Breo Ellipta  Inhale 1 puff into the lungs daily.   HYDROcodone -acetaminophen  7.5-325 MG tablet Commonly known as: Norco One tablet every six hours as need for pain.  Must last 28 days.   latanoprost 0.005 % ophthalmic solution Commonly known as: XALATAN 1 drop daily.   pantoprazole  40 MG tablet Commonly known as: PROTONIX  TAKE 1 TABLET (40 MG TOTAL) BY MOUTH TWICE A DAY BEFORE MEALS   rosuvastatin  40 MG tablet Commonly known as: CRESTOR  Take 1 tablet (40 mg total) by mouth daily.   spironolactone  50 MG tablet Commonly known as: ALDACTONE  TAKE 1  TABLET BY MOUTH EVERY DAY   terbinafine  250 MG tablet Commonly known as: LAMISIL  Take 1 tablet (250 mg total) by mouth daily.   UNABLE TO FIND Nebulizer facemask and tubing x 1  DX J44.9        Allergies:  Allergies  Allergen Reactions   Penicillins Hives and Other (See Comments)    Has patient had a PCN reaction causing immediate rash, facial/tongue/throat swelling, SOB or lightheadedness with hypotension: Unknown Has patient had a PCN reaction causing severe rash involving mucus membranes or skin necrosis: No Has patient had a PCN reaction that required hospitalization: Yes Has patient had a PCN reaction occurring within the last 10 years: No If all of the above answers are "NO", then may proceed with Cephalosporin use.     Family History: Family History  Problem Relation Age of Onset   Heart attack Mother    Diabetes Mother    Hypertension Mother    Stroke Brother    Hypertension Sister    Hypertension Sister    Hypertension Sister    Hypertension Brother    Colon cancer Neg Hx     Social History:  reports that he has been smoking cigarettes. He started smoking about 54 years ago. He has a 13.6 pack-year smoking history. He has never been exposed to tobacco smoke. He has never used smokeless tobacco. He reports current alcohol use. He reports that he does not use drugs.   Physical Exam: BP 128/83   Pulse (!) 111   Constitutional:  Alert and oriented, No acute distress. HEENT: [place] AT, moist mucus membranes.  Trachea midline, no masses. Cardiovascular: No clubbing, cyanosis, or edema. Respiratory: Normal respiratory effort, no increased work of breathing. GI: Abdomen is soft, nontender, nondistended, no abdominal masses GU: No CVA tenderness Skin: No rashes, bruises or suspicious lesions. Neurologic: Grossly intact, no focal deficits, moving all 4 extremities. Psychiatric: Normal mood and affect.  Laboratory Data: Lab Results  Component Value Date   WBC 4.0  01/15/2024   HGB 12.7 (L) 01/15/2024   HCT 37.8 (L) 01/15/2024   MCV 92.6 01/15/2024   PLT 460 (H) 01/15/2024    Lab Results  Component Value Date   CREATININE 0.96 01/15/2024    Lab Results  Component Value Date   PSA 10.79 (H) 01/15/2024   PSA 6.8 (H) 04/16/2020   PSA 7.1 (H) 06/09/2018    No results found for: "TESTOSTERONE"  Lab Results  Component Value Date   HGBA1C 5.2 04/16/2020    Urinalysis    Component Value Date/Time   COLORURINE YELLOW 06/27/2023 1928   APPEARANCEUR CLOUDY (A) 06/27/2023 1928   APPEARANCEUR Clear 02/03/2023 0856   LABSPEC <1.005 (L) 06/27/2023 1928   PHURINE 5.0 06/27/2023 1928  GLUCOSEU NEGATIVE 06/27/2023 1928   GLUCOSEU NEG mg/dL 40/98/1191 4782   HGBUR SMALL (A) 06/27/2023 1928   BILIRUBINUR NEGATIVE 06/27/2023 1928   BILIRUBINUR CANCELED 02/03/2023 0856   KETONESUR NEGATIVE 06/27/2023 1928   PROTEINUR 100 (A) 06/27/2023 1928   UROBILINOGEN negative (A) 05/17/2020 0904   UROBILINOGEN 1 05/03/2008 0116   NITRITE NEGATIVE 06/27/2023 1928   LEUKOCYTESUR LARGE (A) 06/27/2023 1928    Lab Results  Component Value Date   LABMICR See below: 02/03/2023   WBCUA 0-5 02/03/2023   LABEPIT 0-10 02/03/2023   BACTERIA RARE (A) 06/27/2023    Pertinent Imaging:   Results for orders placed during the hospital encounter of 02/26/15  US  Renal  Narrative CLINICAL DATA:  Bilateral flank pain, hypertension  EXAM: RENAL / URINARY TRACT ULTRASOUND COMPLETE  COMPARISON:  PET-CT of 02/16/2014  FINDINGS: Right Kidney:  Length: 12.7 cm. There is minimal fullness of the right renal pelvis. No definite hydronephrosis is seen.  Left Kidney:  Length: 12.3 cm. There is also mild fullness of the renal pelvis. No definite hydronephrosis is noted.  Bladder:  The urinary bladder is unremarkable. Bilateral ureteral jets are noted. The prostate is prominent measuring 6.4 x 5.6 x 5.0 cm with a volume of 92 cubic cm.  IMPRESSION: 1.  Minimal fullness of both renal pelves. 2. Prominent prostate.   Electronically Signed By: Gerrie Krebs M.D. On: 02/26/2015 11:56  No results found for this or any previous visit.  No results found for this or any previous visit.  No results found for this or any previous visit.   Assessment & Plan:    1) gross hematuria - check CT and cystoscopy. Reviewed on monitor and discussed.   2) BPH - disc nature r/b/a to alpha, 5ari or procedures. He will start 5ari - discussed FDA warn.   3) PSA elevation - I had a long discussion with the patient on the nature of elevated PSA - benign vs malignant causes. We discussed age specific levels, risk stratification and PSAD. We discussed the nature risks and benefits of continued surveillance, other lab tests, imaging as well as prostate biopsy. We discussed the management of prostate cancer might include active surveillance or treatment depending on biopsy findings. All questions answered. Given PSA stable and PSAD nl. Will follow and consider MRI or bx p hematuria eval.   No follow-ups on file.  Christina Coyer, MD  [facility]  [address] [place], [place] [address] [phone]

## 2024-03-20 NOTE — Progress Notes (Unsigned)
 Office Note     CC:  follow up Requesting Provider:  Towanda Fret, MD  HPI: Dillon [name] is a 73 y.o. ([birth year]) male who presents for surveillance of AAA and PAD.  He denies any new or changing abdominal or back pain.  As of May 2023 AAA measured 3.6 cm in largest diameter by duplex.  He also denies any rest pain or tissue loss at the level of the feet.  He is also followed for PAD.  His claudication symptoms are ***.  He denies any rest pain or tissue loss at this time.  He is on aspirin , statin, Pletal  daily.  He is a *** smoker.   Past Medical History:  Diagnosis Date   Arthritis    2012   Asthma    childhood   Carotid artery stenosis    COPD (chronic obstructive pulmonary disease) (HCC)    Glaucoma    Hyperlipidemia    1990   Hypertension    1990   Nicotine  addiction    PAD (peripheral artery disease) (HCC) 2015    Past Surgical History:  Procedure Laterality Date   BIOPSY  09/23/2023   Procedure: BIOPSY;  Surgeon: Suzette Espy, MD;  [facility];  Service: Endoscopy;;   COLONOSCOPY WITH PROPOFOL  N/A 11/04/2021   Procedure: COLONOSCOPY WITH PROPOFOL ;  Surgeon: Vinetta Greening, DO;  [facility];  Service: Endoscopy;  Laterality: N/A;  9:30am   ESOPHAGOGASTRODUODENOSCOPY (EGD) WITH PROPOFOL  N/A 09/23/2023   Procedure: ESOPHAGOGASTRODUODENOSCOPY (EGD) WITH PROPOFOL ;  Surgeon: Suzette Espy, MD;  [facility];  Service: Endoscopy;  Laterality: N/A;  130pm, asa 3   KNEE ARTHROSCOPY     both knees    LOWER EXTREMITY ANGIOGRAPHY N/A 02/04/2018   Procedure: LOWER EXTREMITY ANGIOGRAPHY;  Surgeon: Arvil Lauber, MD;  [facility];  Service: Cardiovascular;  Laterality: N/A;   POLYPECTOMY  11/04/2021   Procedure: POLYPECTOMY;  Surgeon: Vinetta Greening, DO;  [facility];  Service: Endoscopy;;    Social History   Socioeconomic History   Marital status: Single    Spouse name: Not on file   Number of  children: Not on file   Years of education: Not on file   Highest education level: Not on file  Occupational History   Not on file  Tobacco Use   Smoking status: Every Day    Current packs/day: 0.25    Average packs/day: 0.3 packs/day for 54.4 years (13.6 ttl pk-yrs)    Types: Cigarettes    Start date: 10/16/1969    Passive exposure: Never   Smokeless tobacco: Never  Vaping Use   Vaping status: Never Used  Substance and Sexual Activity   Alcohol use: Yes    Comment: occassional, max of 72 oz pwr month   Drug use: No   Sexual activity: Yes    Partners: Female  Other Topics Concern   Not on file  Social History Narrative   Not on file   Social Drivers of Health   Financial Resource Strain: Low Risk  (05/25/2023)   Overall Financial Resource Strain (CARDIA)    Difficulty of Paying Living Expenses: Not hard at all  Food Insecurity: No Food Insecurity (05/25/2023)   Hunger Vital Sign    Worried About Running Out of Food in the Last Year: Never true    Ran Out of Food in the Last Year: Never true  Transportation Needs: No Transportation Needs (05/25/2023)  PRAPARE - Administrator, Civil Service (Medical): No    Lack of Transportation (Non-Medical): No  Physical Activity: Sufficiently Active (05/25/2023)   Exercise Vital Sign    Days of Exercise per Week: 7 days    Minutes of Exercise per Session: 30 min  Stress: No Stress Concern Present (05/25/2023)   [employer] of Occupational Health - Occupational Stress Questionnaire    Feeling of Stress : Not at all  Social Connections: Moderately Isolated (05/25/2023)   Social Connection and Isolation Panel [NHANES]    Frequency of Communication with Friends and Family: More than three times a week    Frequency of Social Gatherings with Friends and Family: More than three times a week    Attends Religious Services: 1 to 4 times per year    Active Member of [employer] or Organizations: No    Attends Banker  Meetings: Never    Marital Status: Never married  Intimate Partner Violence: Not At Risk (05/25/2023)   Humiliation, Afraid, Rape, and Kick questionnaire    Fear of Current or Ex-Partner: No    Emotionally Abused: No    Physically Abused: No    Sexually Abused: No    Family History  Problem Relation Age of Onset   Heart attack Mother    Diabetes Mother    Hypertension Mother    Stroke Brother    Hypertension Sister    Hypertension Sister    Hypertension Sister    Hypertension Brother    Colon cancer Neg Hx     Current Outpatient Medications  Medication Sig Dispense Refill   amLODipine  (NORVASC ) 10 MG tablet TAKE 1 TABLET BY MOUTH EVERY DAY 90 tablet 1   aspirin  EC 81 MG tablet Take 81 mg by mouth daily.     atorvastatin  (LIPITOR) 80 MG tablet TAKE 1 TABLET BY MOUTH EVERY DAY 90 tablet 3   cilostazol  (PLETAL ) 100 MG tablet Take 1 tablet (100 mg total) by mouth 2 (two) times daily before a meal. 60 tablet 11   clotrimazole -betamethasone  (LOTRISONE ) cream Apply 1 Application topically 2 (two) times daily. 45 g 1   finasteride  (PROSCAR ) 5 MG tablet Take 1 tablet (5 mg total) by mouth daily. 90 tablet 3   fluticasone  furoate-vilanterol (BREO ELLIPTA ) 200-25 MCG/ACT AEPB Inhale 1 puff into the lungs daily. 60 each 5   HYDROcodone -acetaminophen  (NORCO) 7.5-325 MG tablet One tablet every six hours as need for pain.  Must last 28 days. 110 tablet 0   latanoprost (XALATAN) 0.005 % ophthalmic solution 1 drop daily.     pantoprazole  (PROTONIX ) 40 MG tablet TAKE 1 TABLET (40 MG TOTAL) BY MOUTH TWICE A DAY BEFORE MEALS 180 tablet 1   rosuvastatin  (CRESTOR ) 40 MG tablet Take 1 tablet (40 mg total) by mouth daily. 90 tablet 3   spironolactone  (ALDACTONE ) 50 MG tablet TAKE 1 TABLET BY MOUTH EVERY DAY 90 tablet 1   terbinafine  (LAMISIL ) 250 MG tablet Take 1 tablet (250 mg total) by mouth daily. 14 tablet 0   UNABLE TO FIND Nebulizer facemask and tubing x 1  DX J44.9 1 each 0   No current  facility-administered medications for this visit.    Allergies  Allergen Reactions   Penicillins Hives and Other (See Comments)    Has patient had a PCN reaction causing immediate rash, facial/tongue/throat swelling, SOB or lightheadedness with hypotension: Unknown Has patient had a PCN reaction causing severe rash involving mucus membranes or skin necrosis: No Has patient  had a PCN reaction that required hospitalization: Yes Has patient had a PCN reaction occurring within the last 10 years: No If all of the above answers are "NO", then may proceed with Cephalosporin use.      REVIEW OF SYSTEMS:   [X]  denotes positive finding, [ ]  denotes negative finding Cardiac  Comments:  Chest pain or chest pressure:    Shortness of breath upon exertion:    Short of breath when lying flat:    Irregular heart rhythm:        Vascular    Pain in calf, thigh, or hip brought on by ambulation:    Pain in feet at night that wakes you up from your sleep:     Blood clot in your veins:    Leg swelling:         Pulmonary    Oxygen  at home:    Productive cough:     Wheezing:         Neurologic    Sudden weakness in arms or legs:     Sudden numbness in arms or legs:     Sudden onset of difficulty speaking or slurred speech:    Temporary loss of vision in one eye:     Problems with dizziness:         Gastrointestinal    Blood in stool:     Vomited blood:         Genitourinary    Burning when urinating:     Blood in urine:        Psychiatric    Major depression:         Hematologic    Bleeding problems:    Problems with blood clotting too easily:        Skin    Rashes or ulcers:        Constitutional    Fever or chills:      PHYSICAL EXAMINATION:  There were no vitals filed for this visit.  General:  WDWN in NAD; vital signs documented above Gait: Not observed HENT: WNL, normocephalic Pulmonary: normal non-labored breathing Cardiac: regular HR Abdomen: soft, NT, no  masses Skin: without rashes Vascular Exam/Pulses: *** Extremities: {With/Without:20273} ischemic changes, {With/Without:20273} Gangrene , {With/Without:20273} cellulitis; {With/Without:20273} open wounds;  Musculoskeletal: no muscle wasting or atrophy  Neurologic: A&O X 3 Psychiatric:  The pt has Normal affect.   Non-Invasive Vascular Imaging:   ***    ASSESSMENT/PLAN:: 73 y.o. male here for follow up for AAA and PAD surveillance   -***   Cordie Deters, PA-C [facility] [phone]

## 2024-03-22 ENCOUNTER — Ambulatory Visit (INDEPENDENT_AMBULATORY_CARE_PROVIDER_SITE_OTHER): Admitting: Physician Assistant

## 2024-03-22 ENCOUNTER — Other Ambulatory Visit: Payer: Medicare HMO

## 2024-03-22 ENCOUNTER — Ambulatory Visit: Payer: Medicare HMO

## 2024-03-22 ENCOUNTER — Ambulatory Visit

## 2024-03-22 VITALS — BP 151/85 | HR 73 | Ht 73.0 in | Wt 151.0 lb

## 2024-03-22 DIAGNOSIS — I714 Abdominal aortic aneurysm, without rupture, unspecified: Secondary | ICD-10-CM

## 2024-03-22 DIAGNOSIS — I739 Peripheral vascular disease, unspecified: Secondary | ICD-10-CM

## 2024-03-22 LAB — VAS US ABI WITH/WO TBI
Left ABI: 0.05
Right ABI: 0.54

## 2024-03-28 ENCOUNTER — Telehealth: Payer: Self-pay | Admitting: Orthopaedic Surgery

## 2024-03-28 MED ORDER — HYDROCODONE-ACETAMINOPHEN 7.5-325 MG PO TABS: ORAL_TABLET | ORAL | 0 refills | Status: DC

## 2024-03-28 NOTE — Telephone Encounter (Signed)
 DR. Iline Mallory  Patient request refill on his pain medicine   HYDROcodone -acetaminophen  (NORCO) 7.5-325 MG tablet    [facility] 

## 2024-03-30 NOTE — Progress Notes (Unsigned)
 GI Office Note    Referring Provider: Towanda Fret, MD Primary Care Physician:  Towanda Fret, MD Primary Gastroenterologist: Rolando Cliche. Mordechai April, DO  Date:  03/31/2024  ID:  Arland Bellis, DOB July 22, 1951, MRN [id number]  Chief Complaint   Chief Complaint  Patient presents with   Follow-up   History of Present Illness  DALESSANDRO BALDYGA is a 73 y.o. male with a history of COPD, anemia, arthritis, glaucoma, HTN, HLD, PVD, chronic tobacco use presenting today for follow-up without any complaints.  OV 09/16/2021.  Patient reported no prior colonoscopy and denies any family history of colon cancer.  Did admit to some mild constipation previously but controlled with stool softener once weekly.  No alarm symptoms present.  Did complain of some occasional RUQ discomfort not affected by bowel movements or meals.  Typically occurs when he coughs or bends a certain way with activity.  Occasionally uses NSAIDs for back pain and hydrocodone  2-3 days a week.  Has chronic shortness of breath with exertion.  Given his abdominal pain CT A/P with contrast ordered.  Patient to be scheduled for colonoscopy.   CT A/P 10/24/2021: -3.5 mm infrarenal abdominal aortic aneurysm, recommended follow-up ultrasound every 3 years -Moderate prostate enlargement -No acute abnormality seen in the abdomen or pelvis   Colonoscopy 11/04/2021: - Preparation of the colon was fair.  - Non- bleeding internal hemorrhoids.  - Three 6 to 8 mm polyps in the descending colon  - Two 3 to 4 mm polyps in the rectum. Polyp tissue not retrieved.  - The examination was otherwise normal. - Descending colon polyps revealed to be tubular adenoma and benign colonic mucosa - Repeat colonoscopy in 5 years.    CT A/P 06/27/2023: -3.6 mm abdominal aortic aneurysm (recommended follow-up ultrasound every 2 years) -Prostate enlargement -No acute findings of the stomach, large bowel, small bowel, liver, pancreas, or spleen   OV  08/17/23.  Taking iron over-the-counter daily for a month, still having low energy.  Sometimes dizzy or lightheaded.  Denied any syncope or presyncope.  Denied any rectal bleeding.  Occasional lower abdominal pain usually related to bowel habits.  Does report a daily bowel movement.  Does admit to occasional alcohol use, daily cigarette use.  Does take Advil  as needed for headache/back pain but is fairly rare.  Denied any other alarm symptoms.  Schedule patient for EGD, consider Givens capsule if no etiology found for IDA.  Check labs including CBC, iron, and celiac labs.  Follow-up in 3 months.   EGD 09/23/2023: -Moderate Schatzki's ring (not manipulated) -Erosive reflux esophagitis -Medium size hiatal hernia with Donelda Fujita erosions, distal gastric erosions also present s/p biopsy -Duodenal erosions, otherwise normal-appearing bulb and second portion. -Patient urged to see urology for gross hematuria -Advised to begin Protonix  40 mg once daily, follow-up in 6 weeks. (May or may not need small bowel capsule study) -Pathology with gastric antral and oxyntic mucosa with present H. pylori gastritis. (Pylera sent in for patient)  Last office visit 12/31/23. Within last 1.5 months had 1.5 days of rectal bleeding. Denied chest pain, shortness of breath, edema. Reflux better controlled with medication - no N/V, dysphagia. Having right side pain with sneezing. Difficulty with tastes of food for the last 3 months. Most recent Hgb 11.8. Advised capsule study if ongoing IDA. H. Pylori breath test ordred for documented eradication. CBC and iron panel ordered.   Breath test was positive so bismuth  quadruple therapy ordered. Recommended Vitamin D  supplementation and oral  iron supplementation. Hgb improved at check on 3/21 so no indication for capsule at this time.    Today:  GERD - having breakthrough about 3-4 times a week. Lasts a short amount of times and usually in the mornings. Normally after he eats in the  mornings. Does have coffee in the mornings - 1 cup - regular. Mid lower belly he has some pain and notices it still when he sneezes and coughs.   Taste of foods - still not able to taste food well. He can taste hot sauce somewhat and that makes him sweat.   Rectal bleeding - not long after his last visit he has not has any rectal bleeding. No rectal pain.   No chest pain or shortness of breath. Notices he doesnt want to eat as much when it is warm outside.    Wt Readings from Last 8 Encounters:  03/31/24 148 lb 12.8 oz (67.5 kg)  03/22/24 151 lb (68.5 kg)  02/03/24 151 lb (68.5 kg)  12/31/23 154 lb (69.9 kg)  12/23/23 152 lb 12.8 oz (69.3 kg)  11/17/23 152 lb 6.4 oz (69.1 kg)  11/11/23 155 lb (70.3 kg)  09/23/23 153 lb 10.6 oz (69.7 kg)    Current Outpatient Medications  Medication Sig Dispense Refill   amLODipine  (NORVASC ) 10 MG tablet TAKE 1 TABLET BY MOUTH EVERY DAY 90 tablet 1   aspirin  EC 81 MG tablet Take 81 mg by mouth daily.     atorvastatin  (LIPITOR) 80 MG tablet TAKE 1 TABLET BY MOUTH EVERY DAY 90 tablet 3   cilostazol  (PLETAL ) 100 MG tablet Take 1 tablet (100 mg total) by mouth 2 (two) times daily before a meal. 60 tablet 11   clotrimazole -betamethasone  (LOTRISONE ) cream Apply 1 Application topically 2 (two) times daily. 45 g 1   finasteride  (PROSCAR ) 5 MG tablet Take 1 tablet (5 mg total) by mouth daily. 90 tablet 3   fluticasone  furoate-vilanterol (BREO ELLIPTA ) 200-25 MCG/ACT AEPB Inhale 1 puff into the lungs daily. 60 each 5   HYDROcodone -acetaminophen  (NORCO) 7.5-325 MG tablet One tablet every six hours as need for pain.  Must last 28 days. 110 tablet 0   latanoprost (XALATAN) 0.005 % ophthalmic solution 1 drop daily.     pantoprazole  (PROTONIX ) 40 MG tablet TAKE 1 TABLET (40 MG TOTAL) BY MOUTH TWICE A DAY BEFORE MEALS 180 tablet 1   rosuvastatin  (CRESTOR ) 40 MG tablet Take 1 tablet (40 mg total) by mouth daily. 90 tablet 3   spironolactone  (ALDACTONE ) 50 MG tablet  TAKE 1 TABLET BY MOUTH EVERY DAY 90 tablet 1   terbinafine  (LAMISIL ) 250 MG tablet Take 1 tablet (250 mg total) by mouth daily. 14 tablet 0   UNABLE TO FIND Nebulizer facemask and tubing x 1  DX J44.9 1 each 0   No current facility-administered medications for this visit.    Past Medical History:  Diagnosis Date   Arthritis    2012   Asthma    childhood   Carotid artery stenosis    COPD (chronic obstructive pulmonary disease) (HCC)    Glaucoma    Hyperlipidemia    1990   Hypertension    1990   Nicotine  addiction    PAD (peripheral artery disease) (HCC) 2015    Past Surgical History:  Procedure Laterality Date   BIOPSY  09/23/2023   Procedure: BIOPSY;  Surgeon: Suzette Espy, MD;  Location: [facility];  Service: Endoscopy;;   COLONOSCOPY WITH PROPOFOL  N/A 11/04/2021  Procedure: COLONOSCOPY WITH PROPOFOL ;  Surgeon: Vinetta Greening, DO;  Location: [facility];  Service: Endoscopy;  Laterality: N/A;  9:30am   ESOPHAGOGASTRODUODENOSCOPY (EGD) WITH PROPOFOL  N/A 09/23/2023   Procedure: ESOPHAGOGASTRODUODENOSCOPY (EGD) WITH PROPOFOL ;  Surgeon: Suzette Espy, MD;  Location: [facility];  Service: Endoscopy;  Laterality: N/A;  130pm, asa 3   KNEE ARTHROSCOPY     both knees    LOWER EXTREMITY ANGIOGRAPHY N/A 02/04/2018   Procedure: LOWER EXTREMITY ANGIOGRAPHY;  Surgeon: Arvil Lauber, MD;  Location: [facility];  Service: Cardiovascular;  Laterality: N/A;   POLYPECTOMY  11/04/2021   Procedure: POLYPECTOMY;  Surgeon: Vinetta Greening, DO;  Location: [facility];  Service: Endoscopy;;    Family History  Problem Relation Age of Onset   Heart attack Mother    Diabetes Mother    Hypertension Mother    Stroke Brother    Hypertension Sister    Hypertension Sister    Hypertension Sister    Hypertension Brother    Colon cancer Neg Hx     Allergies as of 03/31/2024 - Review Complete 03/31/2024  Allergen Reaction Noted   Penicillins Hives and Other (See Comments)  02/24/2008    Social History   Socioeconomic History   Marital status: Single    Spouse name: Not on file   Number of children: Not on file   Years of education: Not on file   Highest education level: Not on file  Occupational History   Not on file  Tobacco Use   Smoking status: Every Day    Current packs/day: 0.25    Average packs/day: 0.3 packs/day for 54.5 years (13.6 ttl pk-yrs)    Types: Cigarettes    Start date: 10/16/1969    Passive exposure: Never   Smokeless tobacco: Never  Vaping Use   Vaping status: Never Used  Substance and Sexual Activity   Alcohol use: Yes    Comment: occassional, max of 72 oz pwr month   Drug use: No   Sexual activity: Yes    Partners: Female  Other Topics Concern   Not on file  Social History Narrative   Not on file   Social Drivers of Health   Financial Resource Strain: Low Risk  (05/25/2023)   Overall Financial Resource Strain (CARDIA)    Difficulty of Paying Living Expenses: Not hard at all  Food Insecurity: No Food Insecurity (05/25/2023)   Hunger Vital Sign    Worried About Running Out of Food in the Last Year: Never true    Ran Out of Food in the Last Year: Never true  Transportation Needs: No Transportation Needs (05/25/2023)   PRAPARE - Administrator, Civil Service (Medical): No    Lack of Transportation (Non-Medical): No  Physical Activity: Sufficiently Active (05/25/2023)   Exercise Vital Sign    Days of Exercise per Week: 7 days    Minutes of Exercise per Session: 30 min  Stress: No Stress Concern Present (05/25/2023)   [employer] of Occupational Health - Occupational Stress Questionnaire    Feeling of Stress : Not at all  Social Connections: Moderately Isolated (05/25/2023)   Social Connection and Isolation Panel [NHANES]    Frequency of Communication with Friends and Family: More than three times a week    Frequency of Social Gatherings with Friends and Family: More than three times a week    Attends  Religious Services: 1 to 4 times per year    Active  Member of Clubs or Organizations: No    Attends Banker Meetings: Never    Marital Status: Never married    Review of Systems   Gen: Denies fever, chills, anorexia. Denies fatigue, weakness, weight loss.  CV: Denies chest pain, palpitations, syncope, peripheral edema, and claudication. Resp: Denies dyspnea at rest, cough, wheezing, coughing up blood, and pleurisy. GI: See HPI Derm: Denies rash, itching, dry skin Psych: Denies depression, anxiety, memory loss, confusion. No homicidal or suicidal ideation.  Heme: Denies bruising, bleeding, and enlarged lymph nodes.  Physical Exam   BP 131/77 (BP Location: Right Arm, Patient Position: Sitting, Cuff Size: Normal)   Pulse 92   Temp 98.3 F (36.8 C) (Oral)   Ht 6' 2.5" (1.892 m)   Wt 148 lb 12.8 oz (67.5 kg)   SpO2 96%   BMI 18.85 kg/m   General:   Alert and oriented. No distress noted. Pleasant and cooperative.  Head:  Normocephalic and atraumatic. Eyes:  Conjuctiva clear without scleral icterus.  Abdomen:  +BS, soft, non-distended. Mild ttp to RLQ. No rebound or guarding. No HSM or masses noted. Rectal: deferred Msk:  Symmetrical without gross deformities. Normal posture. Extremities:  Without edema. Neurologic:  Alert and  oriented x4 Psych:  Alert and cooperative. Normal mood and affect.  Assessment  BUCKLEY BRADLY is a 73 y.o. male with a history of COPD, anemia, arthritis, glaucoma, HTN, HLD, PVD, chronic tobacco use presenting today for follow up.   H. Pylori, GERD:  - History of erosive reflux esophagitis in November 2020 for an EGD. - Gastric biopsies positive for H. pylori. - Initially treated H. pylori with Pylera with unsuccessful eradication, sent in optimize bismuth  triple therapy for patient.  Need to document eradication after this treatment.  Will stop PPI for 2 weeks and perform repeat breath test - Currently maintained on pantoprazole  40 mg once  daily.  Continues to have some breakthrough reflux symptoms about 3 times per week, usually occurring in the mornings. - While off of PPI, I have recommended famotidine  20 mg twice daily and to not take morning of breath test. - If H. pylori is eradicated and he is continuing to have reflux symptoms then we could increase pantoprazole  back to twice daily or switch to Voquezna given his history of reflux esophagitis.  IDA:  - EGD also with duodenal erosions and Cameron erosions.  This along with H. pylori likely contributing factor to anemia - Colonoscopy in January 2023 with multiple tubular adenomas removed.  Due for follow-up in 2028. - Currently without any melena or BRBPR, chest pain, shortness of breath, lack of appetite. - Labs in March with improved hemoglobin to 12.7 although iron and iron saturation low, ferritin within normal limits. - Maintained on Pletal . - Unable to afford over-the-counter iron, will send in prescription to take once daily.  Hypogeusia:  - Continues to have lack of taste with his foods and with hot sauce. - Previously also having issues with sweet or salty taste. - Was unable to afford over-the-counter zinc, will try sending in prescription today.  PLAN   Stop PPI for 2 weeks again and repeat breath test.  Take famotidine  twice daily while awaiting to get breath test.  Start iron supplement once daily. CBC, iron panel. Conitnue vitamin D  supplementation Zinc to help with taste.  Prescription sent. Follow up in 6 months.   Julian Obey, MSN, FNP-BC, AGACNP-BC [facility]

## 2024-03-31 ENCOUNTER — Encounter: Payer: Self-pay | Admitting: Gastroenterology

## 2024-03-31 ENCOUNTER — Ambulatory Visit (INDEPENDENT_AMBULATORY_CARE_PROVIDER_SITE_OTHER): Admitting: Gastroenterology

## 2024-03-31 VITALS — BP 131/77 | HR 92 | Temp 98.3°F | Ht 74.5 in | Wt 148.8 lb

## 2024-03-31 DIAGNOSIS — K2100 Gastro-esophageal reflux disease with esophagitis, without bleeding: Secondary | ICD-10-CM

## 2024-03-31 DIAGNOSIS — D509 Iron deficiency anemia, unspecified: Secondary | ICD-10-CM | POA: Diagnosis not present

## 2024-03-31 DIAGNOSIS — Z8619 Personal history of other infectious and parasitic diseases: Secondary | ICD-10-CM | POA: Diagnosis not present

## 2024-03-31 DIAGNOSIS — R438 Other disturbances of smell and taste: Secondary | ICD-10-CM

## 2024-03-31 DIAGNOSIS — Z860101 Personal history of adenomatous and serrated colon polyps: Secondary | ICD-10-CM | POA: Diagnosis not present

## 2024-03-31 DIAGNOSIS — B9681 Helicobacter pylori [H. pylori] as the cause of diseases classified elsewhere: Secondary | ICD-10-CM

## 2024-03-31 MED ORDER — ZINC 30 MG PO CAPS: 1.0000 | ORAL_CAPSULE | Freq: Every day | ORAL | 1 refills | Status: AC

## 2024-03-31 MED ORDER — FERROUS SULFATE 324 (65 FE) MG PO TBEC: 1.0000 | DELAYED_RELEASE_TABLET | Freq: Every day | ORAL | 5 refills | Status: AC

## 2024-03-31 NOTE — Patient Instructions (Addendum)
 Please stop your pantoprazole  for 2 weeks starting tomorrow.  On June 19 or after you may go to the lab to do your breath test and your lab work.  While you are off of your pantoprazole , if you are having any severe acid reflux symptoms you may take famotidine /Pepcid  20 mg twice daily.  If that is not relieving your symptoms or not tolerable and then please let me know.  I have sent in zinc to the pharmacy for you, you will take 1 capsule daily to see if this helps improve your taste of foods.  If it is too expensive or not covered by insurance it is okay not to take the medication.  I do want to ensure that you are taking vitamin D , this is also over-the-counter and if it is not affordable please let me know and I am happy to try to send this in as prescription as well.  We will go ahead and plan for a follow-up in 6 months or sooner if needed.  I will be in touch with results in the meantime and if we need to have an office visit prior to 6 months we certainly can see you sooner.  If you develop any black/tarry stools or bright red blood in your stools again do not hesitate to call the office.  Please also let me know if you have any worsening/regular abdominal pain.  It was a pleasure to see you today! I want to create trusting relationships with patients. If you receive a survey regarding your visit,  I greatly appreciate you taking time to fill this out on paper or through your MyChart. I value your feedback.  Julian Obey, MSN, FNP-BC, AGACNP-BC [facility]

## 2024-04-12 DIAGNOSIS — H401113 Primary open-angle glaucoma, right eye, severe stage: Secondary | ICD-10-CM | POA: Diagnosis not present

## 2024-04-12 DIAGNOSIS — H401121 Primary open-angle glaucoma, left eye, mild stage: Secondary | ICD-10-CM | POA: Diagnosis not present

## 2024-04-14 ENCOUNTER — Encounter: Payer: Self-pay | Admitting: Family Medicine

## 2024-04-14 ENCOUNTER — Ambulatory Visit: Admitting: Family Medicine

## 2024-04-14 VITALS — BP 135/75 | HR 91 | Resp 16 | Ht 74.5 in | Wt 152.0 lb

## 2024-04-14 DIAGNOSIS — I70213 Atherosclerosis of native arteries of extremities with intermittent claudication, bilateral legs: Secondary | ICD-10-CM

## 2024-04-14 DIAGNOSIS — F17210 Nicotine dependence, cigarettes, uncomplicated: Secondary | ICD-10-CM

## 2024-04-14 DIAGNOSIS — E785 Hyperlipidemia, unspecified: Secondary | ICD-10-CM

## 2024-04-14 DIAGNOSIS — N401 Enlarged prostate with lower urinary tract symptoms: Secondary | ICD-10-CM

## 2024-04-14 DIAGNOSIS — G8929 Other chronic pain: Secondary | ICD-10-CM | POA: Diagnosis not present

## 2024-04-14 DIAGNOSIS — M545 Low back pain, unspecified: Secondary | ICD-10-CM | POA: Diagnosis not present

## 2024-04-14 DIAGNOSIS — R972 Elevated prostate specific antigen [PSA]: Secondary | ICD-10-CM

## 2024-04-14 DIAGNOSIS — I739 Peripheral vascular disease, unspecified: Secondary | ICD-10-CM

## 2024-04-14 DIAGNOSIS — I10 Essential (primary) hypertension: Secondary | ICD-10-CM | POA: Diagnosis not present

## 2024-04-14 DIAGNOSIS — F17200 Nicotine dependence, unspecified, uncomplicated: Secondary | ICD-10-CM | POA: Diagnosis not present

## 2024-04-14 DIAGNOSIS — R350 Frequency of micturition: Secondary | ICD-10-CM

## 2024-04-14 MED ORDER — AMLODIPINE BESYLATE 10 MG PO TABS: 10.0000 mg | ORAL_TABLET | Freq: Every day | ORAL | 1 refills | Status: DC

## 2024-04-14 MED ORDER — SPIRONOLACTONE 50 MG PO TABS
50.0000 mg | ORAL_TABLET | Freq: Every day | ORAL | 1 refills | Status: AC
Start: 2024-04-14 — End: ?

## 2024-04-14 NOTE — Assessment & Plan Note (Signed)
 Annual ABI and vascular evaluation, competed and stable in 2025

## 2024-04-14 NOTE — Assessment & Plan Note (Signed)
Asked:confirms currently smokes cigarettes Assess: Unwilling to set a quit date,not actively cutting back Advise: needs to QUIT to reduce risk of cancer, cardio and cerebrovascular disease Assist: counseled for 5 minutes and literature provided Arrange: follow up in 2 to 4 months

## 2024-04-14 NOTE — Progress Notes (Signed)
 Dillon [name]     MRN: [id number]      DOB: 11-07-1950  Chief Complaint  Patient presents with   Medical Management of Chronic Issues    10 week follow up     HPI Dillon [name] is here for follow up and re-evaluation of chronic medical conditions, medication management and review of any available recent lab and radiology data.  Preventive health is updated, specifically  Cancer screening and Immunization.   Questions or concerns regarding consultations or procedures which the PT has had in the interim are  addressed. The PT denies any adverse reactions to current medications since the last visit.  There are no new concerns.  There are no specific complaints   ROS Denies recent fever or chills. Denies sinus pressure, nasal congestion, ear pain or sore throat. Denies chest congestion, productive cough or wheezing. Denies chest pains, palpitations and leg swelling Denies abdominal pain, nausea, vomiting,diarrhea or constipation.   Denies dysuria, frequency, hesitancy or incontinence. Denies joint pain, swelling and limitation in mobility. Denies headaches, seizures, numbness, or tingling. Denies depression, anxiety or insomnia. Denies skin break down or rash.   PE  BP 135/75   Pulse 91   Resp 16   Ht 6' 2.5 (1.892 m)   Wt 152 lb (68.9 kg)   SpO2 96%   BMI 19.25 kg/m   Patient alert and oriented and in no cardiopulmonary distress.  HEENT: No facial asymmetry, EOMI,     Neck supple .  Chest: Clear to auscultation bilaterally.  CVS: S1, S2 no murmurs, no S3.Regular rate.  ABD: Soft non tender.   Ext: No edema  MS: Adequate ROM spine, shoulders, hips and knees.  Skin: Intact, no ulcerations or rash noted.  Psych: Good eye contact, normal affect. Memory intact not anxious or depressed appearing.  CNS: CN 2-12 intact, power,  normal throughout.no focal deficits noted.   Assessment & Plan  Essential hypertension Controlled, no change in medication DASH diet and  commitment to daily physical activity for a minimum of 30 minutes discussed and encouraged, as a part of hypertension management. The importance of attaining a healthy weight is also discussed.     04/14/2024    4:19 PM 03/31/2024    3:09 PM 03/22/2024    9:27 AM 03/14/2024    1:04 PM 02/03/2024    8:41 AM 12/31/2023    2:33 PM 12/31/2023    2:31 PM  BP/Weight  Systolic BP 135 131 151 128 122 146 142  Diastolic BP 75 77 85 83 67 81 80  Wt. (Lbs) 152 148.8 151  151  154  BMI 19.25 kg/m2 18.85 kg/m2 19.92 kg/m2  19.65 kg/m2  19.77 kg/m2       Hyperlipidemia LDL goal <70 Hyperlipidemia:Low fat diet discussed and encouraged.   Lipid Panel  Lab Results  Component Value Date   CHOL 182 01/15/2024   HDL 86 01/15/2024   LDLCALC 81 01/15/2024   TRIG 74 01/15/2024   CHOLHDL 2.1 01/15/2024     Updated lab needed at/ before next visit.   NICOTINE  ADDICTION Asked:confirms currently smokes cigarettes Assess: Unwilling to set a quit date, not actively  cutting back Advise: needs to QUIT to reduce risk of cancer, cardio and cerebrovascular disease Assist: counseled for 5 minutes and literature provided Arrange: follow up in 2 to 4 months   PAD (peripheral artery disease) (HCC) Annual ABI and vascular evaluation, competed and stable in 2025  Back pain Managed by Ortho  Atherosclerosis of native arteries of extremity with intermittent claudication (HCC) Followed annually  by vascular  Benign prostatic hyperplasia with urinary frequency Managed by urology and on med for symptom control  Elevated PSA Followed by urology

## 2024-04-14 NOTE — Assessment & Plan Note (Signed)
 Followed annually  by vascular

## 2024-04-14 NOTE — Assessment & Plan Note (Signed)
 Controlled, no change in medication DASH diet and commitment to daily physical activity for a minimum of 30 minutes discussed and encouraged, as a part of hypertension management. The importance of attaining a healthy weight is also discussed.     04/14/2024    4:19 PM 03/31/2024    3:09 PM 03/22/2024    9:27 AM 03/14/2024    1:04 PM 02/03/2024    8:41 AM 12/31/2023    2:33 PM 12/31/2023    2:31 PM  BP/Weight  Systolic BP 135 131 151 128 122 146 142  Diastolic BP 75 77 85 83 67 81 80  Wt. (Lbs) 152 148.8 151  151  154  BMI 19.25 kg/m2 18.85 kg/m2 19.92 kg/m2  19.65 kg/m2  19.77 kg/m2

## 2024-04-14 NOTE — Assessment & Plan Note (Signed)
 Hyperlipidemia:Low fat diet discussed and encouraged.   Lipid Panel  Lab Results  Component Value Date   CHOL 182 01/15/2024   HDL 86 01/15/2024   LDLCALC 81 01/15/2024   TRIG 74 01/15/2024   CHOLHDL 2.1 01/15/2024     Updated lab needed at/ before next visit.

## 2024-04-14 NOTE — Assessment & Plan Note (Signed)
 Followed by urology.

## 2024-04-14 NOTE — Assessment & Plan Note (Signed)
 Managed by Ortho

## 2024-04-14 NOTE — Assessment & Plan Note (Signed)
 Managed by urology and on med for symptom control

## 2024-04-14 NOTE — Patient Instructions (Signed)
 F/U in 4 months, call if you need me sooner  Need to stop smoking  Fasting lipid, cmp and EGFR  3 to 5 days before next visit  Thanks for choosing [facility], we consider it a privelige to serve you.

## 2024-04-25 DIAGNOSIS — K2970 Gastritis, unspecified, without bleeding: Secondary | ICD-10-CM | POA: Diagnosis not present

## 2024-04-25 DIAGNOSIS — D509 Iron deficiency anemia, unspecified: Secondary | ICD-10-CM | POA: Diagnosis not present

## 2024-04-25 DIAGNOSIS — B9681 Helicobacter pylori [H. pylori] as the cause of diseases classified elsewhere: Secondary | ICD-10-CM | POA: Diagnosis not present

## 2024-04-26 ENCOUNTER — Ambulatory Visit: Payer: Self-pay | Admitting: Gastroenterology

## 2024-04-26 ENCOUNTER — Other Ambulatory Visit: Payer: Self-pay | Admitting: Gastroenterology

## 2024-04-26 ENCOUNTER — Other Ambulatory Visit: Payer: Self-pay | Admitting: *Deleted

## 2024-04-26 DIAGNOSIS — B9681 Helicobacter pylori [H. pylori] as the cause of diseases classified elsewhere: Secondary | ICD-10-CM

## 2024-04-26 LAB — CBC
HCT: 38 % — ABNORMAL LOW (ref 38.5–50.0)
Hemoglobin: 12.8 g/dL — ABNORMAL LOW (ref 13.2–17.1)
MCH: 32.8 pg (ref 27.0–33.0)
MCHC: 33.7 g/dL (ref 32.0–36.0)
MCV: 97.4 fL (ref 80.0–100.0)
MPV: 8.1 fL (ref 7.5–12.5)
Platelets: 387 10*3/uL (ref 140–400)
RBC: 3.9 10*6/uL — ABNORMAL LOW (ref 4.20–5.80)
RDW: 13.2 % (ref 11.0–15.0)
WBC: 6 10*3/uL (ref 3.8–10.8)

## 2024-04-26 LAB — IRON,TIBC AND FERRITIN PANEL
%SAT: 12 % — ABNORMAL LOW (ref 20–48)
Ferritin: 67 ng/mL (ref 24–380)
Iron: 43 ug/dL — ABNORMAL LOW (ref 50–180)
TIBC: 353 ug/dL (ref 250–425)

## 2024-04-26 LAB — H. PYLORI BREATH TEST: H. pylori Breath Test: DETECTED — AB

## 2024-04-27 ENCOUNTER — Telehealth: Payer: Self-pay

## 2024-04-27 MED ORDER — HYDROCODONE-ACETAMINOPHEN 7.5-325 MG PO TABS: ORAL_TABLET | ORAL | 0 refills | Status: DC

## 2024-04-27 NOTE — Telephone Encounter (Signed)
Hydrocodone-Acetaminophen 7.5/325 MG    Qty 110 Tablets  PATIENT USES [facility]

## 2024-05-03 ENCOUNTER — Ambulatory Visit (HOSPITAL_COMMUNITY): Attending: Urology

## 2024-05-04 ENCOUNTER — Ambulatory Visit (INDEPENDENT_AMBULATORY_CARE_PROVIDER_SITE_OTHER): Admitting: Orthopaedic Surgery

## 2024-05-04 ENCOUNTER — Encounter: Payer: Self-pay | Admitting: Orthopaedic Surgery

## 2024-05-04 VITALS — BP 144/82 | HR 80 | Ht 74.5 in | Wt 152.0 lb

## 2024-05-04 DIAGNOSIS — M5442 Lumbago with sciatica, left side: Secondary | ICD-10-CM

## 2024-05-04 DIAGNOSIS — G8929 Other chronic pain: Secondary | ICD-10-CM | POA: Diagnosis not present

## 2024-05-04 DIAGNOSIS — F17210 Nicotine dependence, cigarettes, uncomplicated: Secondary | ICD-10-CM | POA: Diagnosis not present

## 2024-05-04 NOTE — Progress Notes (Signed)
 My back is sore at times.  He has been doing fairly well in the warm weather with his lower back pain.  He has soreness but no new trauma, no weakness.  He is active.  Spine/Pelvis examination:  Inspection:  Overall, sacoiliac joint benign and hips nontender; without crepitus or defects.   Thoracic spine inspection: Alignment normal without kyphosis present   Lumbar spine inspection:  Alignment  with normal lumbar lordosis, without scoliosis apparent.   Thoracic spine palpation:  without tenderness of spinal processes   Lumbar spine palpation: without tenderness of lumbar area; without tightness of lumbar muscles    Range of Motion:   Lumbar flexion, forward flexion is normal without pain or tenderness    Lumbar extension is full without pain or tenderness   Left lateral bend is normal without pain or tenderness   Right lateral bend is normal without pain or tenderness   Straight leg raising is normal  Strength & tone: normal   Stability overall normal stability  Encounter Diagnoses  Name Primary?   Chronic midline low back pain with left-sided sciatica Yes   Cigarette nicotine  dependence without complication    Continue his exercises.  Call if any problem.  Precautions discussed.  Return in three months.  Electronically Signed Lemond Stable, MD 7/9/20258:10 AM

## 2024-05-13 NOTE — Addendum Note (Signed)
 Addended by: SAMMIE EXIE HERO on: 05/13/2024 12:46 PM   Modules accepted: Orders

## 2024-05-15 ENCOUNTER — Other Ambulatory Visit: Payer: Self-pay | Admitting: Family Medicine

## 2024-05-16 ENCOUNTER — Ambulatory Visit (INDEPENDENT_AMBULATORY_CARE_PROVIDER_SITE_OTHER): Admitting: Urology

## 2024-05-16 DIAGNOSIS — R310 Gross hematuria: Secondary | ICD-10-CM | POA: Diagnosis not present

## 2024-05-16 DIAGNOSIS — R350 Frequency of micturition: Secondary | ICD-10-CM | POA: Diagnosis not present

## 2024-05-16 DIAGNOSIS — N401 Enlarged prostate with lower urinary tract symptoms: Secondary | ICD-10-CM | POA: Diagnosis not present

## 2024-05-16 DIAGNOSIS — R972 Elevated prostate specific antigen [PSA]: Secondary | ICD-10-CM | POA: Diagnosis not present

## 2024-05-16 DIAGNOSIS — E559 Vitamin D deficiency, unspecified: Secondary | ICD-10-CM | POA: Diagnosis not present

## 2024-05-16 DIAGNOSIS — N138 Other obstructive and reflux uropathy: Secondary | ICD-10-CM

## 2024-05-16 DIAGNOSIS — E785 Hyperlipidemia, unspecified: Secondary | ICD-10-CM | POA: Diagnosis not present

## 2024-05-16 DIAGNOSIS — R3912 Poor urinary stream: Secondary | ICD-10-CM

## 2024-05-16 DIAGNOSIS — D509 Iron deficiency anemia, unspecified: Secondary | ICD-10-CM | POA: Diagnosis not present

## 2024-05-16 MED ORDER — CIPROFLOXACIN HCL 500 MG PO TABS: 500.0000 mg | ORAL_TABLET | Freq: Once | ORAL | Status: DC

## 2024-05-16 NOTE — Progress Notes (Signed)
 05/16/2024 9:42 AM   Dillon [name] July 16, 1951 [id number]  Referring provider: Antonetta Rollene BRAVO, MD [address], [address] [place],  [place] [address]  No chief complaint on file.   HPI:  F/u -    1) PSA elevation-patient had a PSA of 3.9 in 2014 which rose up to 10.4 by 2017.  PSA slightly lower in 2021 at 6.8.  His March 2025 PSA was 10.8, cr 0.96. August 2024 CT benign.  No LAD or bone lesion. Prostate was about 95 g with a small median lobe. No FH PCa. No biopsy.    2) BPH-prostate about 95 g on imaging with a small median lobe. No prostate meds. No surgery. IPSS 6.    3) gross hematuria - He voided and noted red urine Feb 2025. No clots. No recurrence. He is smoker.   Today, seen for the above. He did not schedule CT scan and declined cystoscopy today. No dysuria or gross hematuria. Rx finasteride  in May 2025 and he reports he is taking it.    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   PMH: Past Medical History:  Diagnosis Date   Arthritis    2012   Asthma    childhood   Carotid artery stenosis    COPD (chronic obstructive pulmonary disease) (HCC)    Glaucoma    Hyperlipidemia    1990   Hypertension    1990   Nicotine  addiction    PAD (peripheral artery disease) (HCC) 2015    Surgical History: Past Surgical History:  Procedure Laterality Date   BIOPSY  09/23/2023   Procedure: BIOPSY;  Surgeon: Shaaron Lamar HERO, MD;  [facility];  Service: Endoscopy;;   COLONOSCOPY WITH PROPOFOL  N/A 11/04/2021   Procedure: COLONOSCOPY WITH PROPOFOL ;  Surgeon: Cindie Carlin POUR, DO;  [facility];  Service: Endoscopy;  Laterality: N/A;  9:30am   ESOPHAGOGASTRODUODENOSCOPY (EGD) WITH PROPOFOL  N/A 09/23/2023   Procedure: ESOPHAGOGASTRODUODENOSCOPY (EGD) WITH PROPOFOL ;  Surgeon: Shaaron Lamar HERO, MD;  [facility];  Service: Endoscopy;  Laterality: N/A;  130pm, asa 3   KNEE ARTHROSCOPY     both knees    LOWER EXTREMITY ANGIOGRAPHY N/A  02/04/2018   Procedure: LOWER EXTREMITY ANGIOGRAPHY;  Surgeon: Laurence Redell LITTIE, MD;  Location[facility] INVASIVE CV LAB;  Service: Cardiovascular;  Laterality: N/A;   POLYPECTOMY  11/04/2021   Procedure: POLYPECTOMY;  Surgeon: Cindie Carlin POUR, DO;  [facility];  Service: Endoscopy;;    Home Medications:  Allergies as of 05/16/2024       Reactions   Penicillins Hives, Other (See Comments)   Has patient had a PCN reaction causing immediate rash, facial/tongue/throat swelling, SOB or lightheadedness with hypotension: Unknown Has patient had a PCN reaction causing severe rash involving mucus membranes or skin necrosis: No Has patient had a PCN reaction that required hospitalization: Yes Has patient had a PCN reaction occurring within the last 10 years: No If all of the above answers are NO, then may proceed with Cephalosporin use.        Medication List        Accurate as of May 16, 2024  9:42 AM. If you have any questions, ask your nurse or doctor.          amLODipine  10 MG tablet Commonly known as: NORVASC  Take 1 tablet (10 mg total) by mouth daily.   aspirin  EC 81 MG tablet Take 81 mg by mouth daily.  atorvastatin  80 MG tablet Commonly known as: LIPITOR TAKE 1 TABLET BY MOUTH EVERY DAY   cilostazol  100 MG tablet Commonly known as: PLETAL  Take 1 tablet (100 mg total) by mouth 2 (two) times daily before a meal.   clotrimazole -betamethasone  cream Commonly known as: LOTRISONE  Apply 1 Application topically 2 (two) times daily.   ferrous sulfate  324 (65 Fe) MG Tbec Take 1 tablet (325 mg total) by mouth daily. Take with orange juice or vitamin C.   finasteride  5 MG tablet Commonly known as: PROSCAR  Take 1 tablet (5 mg total) by mouth daily.   fluticasone  furoate-vilanterol 200-25 MCG/ACT Aepb Commonly known as: Breo Ellipta  Inhale 1 puff into the lungs daily.   HYDROcodone -acetaminophen  7.5-325 MG tablet Commonly known as: Norco One tablet every six hours  as need for pain.  Must last 28 days.   latanoprost 0.005 % ophthalmic solution Commonly known as: XALATAN 1 drop daily.   pantoprazole  40 MG tablet Commonly known as: PROTONIX  TAKE 1 TABLET (40 MG TOTAL) BY MOUTH TWICE A DAY BEFORE MEALS   rosuvastatin  40 MG tablet Commonly known as: CRESTOR  Take 1 tablet (40 mg total) by mouth daily.   spironolactone  50 MG tablet Commonly known as: ALDACTONE  Take 1 tablet (50 mg total) by mouth daily.   UNABLE TO FIND Nebulizer facemask and tubing x 1  DX J44.9   Zinc  30 MG Caps Take 1 capsule (30 mg total) by mouth daily.        Allergies:  Allergies  Allergen Reactions   Penicillins Hives and Other (See Comments)    Has patient had a PCN reaction causing immediate rash, facial/tongue/throat swelling, SOB or lightheadedness with hypotension: Unknown Has patient had a PCN reaction causing severe rash involving mucus membranes or skin necrosis: No Has patient had a PCN reaction that required hospitalization: Yes Has patient had a PCN reaction occurring within the last 10 years: No If all of the above answers are NO, then may proceed with Cephalosporin use.     Family History: Family History  Problem Relation Age of Onset   Heart attack Mother    Diabetes Mother    Hypertension Mother    Stroke Brother    Hypertension Sister    Hypertension Sister    Hypertension Sister    Hypertension Brother    Colon cancer Neg Hx     Social History:  reports that he has been smoking cigarettes. He started smoking about 54 years ago. He has a 13.6 pack-year smoking history. He has never been exposed to tobacco smoke. He has never used smokeless tobacco. He reports current alcohol use. He reports that he does not use drugs.   Physical Exam: There were no vitals taken for this visit.  Constitutional:  Alert and oriented, No acute distress. HEENT: [place] AT, moist mucus membranes.  Trachea midline, no masses. Cardiovascular: No clubbing,  cyanosis, or edema. Respiratory: Normal respiratory effort, no increased work of breathing. GI: Abdomen is soft, nontender, nondistended, no abdominal masses Skin: No rashes, bruises or suspicious lesions. Neurologic: Grossly intact, no focal deficits, moving all 4 extremities. Psychiatric: Normal mood and affect.  Laboratory Data: Lab Results  Component Value Date   WBC 6.0 04/25/2024   HGB 12.8 (L) 04/25/2024   HCT 38.0 (L) 04/25/2024   MCV 97.4 04/25/2024   PLT 387 04/25/2024    Lab Results  Component Value Date   CREATININE 0.96 01/15/2024    Lab Results  Component Value Date   PSA 10.79 (  H) 01/15/2024   PSA 6.8 (H) 04/16/2020   PSA 7.1 (H) 06/09/2018    No results found for: TESTOSTERONE  Lab Results  Component Value Date   HGBA1C 5.2 04/16/2020    Urinalysis    Component Value Date/Time   COLORURINE YELLOW 06/27/2023 1928   APPEARANCEUR CLOUDY (A) 06/27/2023 1928   APPEARANCEUR Clear 02/03/2023 0856   LABSPEC <1.005 (L) 06/27/2023 1928   PHURINE 5.0 06/27/2023 1928   GLUCOSEU NEGATIVE 06/27/2023 1928   GLUCOSEU NEG mg/dL 92/91/7990 9883   HGBUR SMALL (A) 06/27/2023 1928   BILIRUBINUR NEGATIVE 06/27/2023 1928   BILIRUBINUR CANCELED 02/03/2023 0856   KETONESUR NEGATIVE 06/27/2023 1928   PROTEINUR 100 (A) 06/27/2023 1928   UROBILINOGEN negative (A) 05/17/2020 0904   UROBILINOGEN 1 05/03/2008 0116   NITRITE NEGATIVE 06/27/2023 1928   LEUKOCYTESUR LARGE (A) 06/27/2023 1928    Lab Results  Component Value Date   LABMICR See below: 02/03/2023   WBCUA 0-5 02/03/2023   LABEPIT 0-10 02/03/2023   BACTERIA RARE (A) 06/27/2023    Pertinent Imaging:   Results for orders placed during the hospital encounter of 02/26/15  US  Renal  Narrative CLINICAL DATA:  Bilateral flank pain, hypertension  EXAM: RENAL / URINARY TRACT ULTRASOUND COMPLETE  COMPARISON:  PET-CT of 02/16/2014  FINDINGS: Right Kidney:  Length: 12.7 cm. There is minimal fullness  of the right renal pelvis. No definite hydronephrosis is seen.  Left Kidney:  Length: 12.3 cm. There is also mild fullness of the renal pelvis. No definite hydronephrosis is noted.  Bladder:  The urinary bladder is unremarkable. Bilateral ureteral jets are noted. The prostate is prominent measuring 6.4 x 5.6 x 5.0 cm with a volume of 92 cubic cm.  IMPRESSION: 1. Minimal fullness of both renal pelves. 2. Prominent prostate.   Electronically Signed By: Deward Dames M.D. On: 02/26/2015 11:56  No results found for this or any previous visit.  No results found for this or any previous visit.  No results found for this or any previous visit.   Assessment & Plan:    1. Gross hematuria (Primary) Again using the video board discussed benign and malignant causes of gross hematuria and elevated PSA which can be life threatening. I recommended CT scan and cystoscopy.   - Urinalysis, Routine w reflex microscopic - Cystoscopy (Bedside) - ciprofloxacin  (CIPRO ) tablet 500 mg  2. PSA elevation - again discussed above evaluation will also help to evaluate PSA elevation - both benign and malignant causes. PSA was sent - should be lower on 5ari.   No follow-ups on file.  Dillon Brooks, MD  [facility]  [address] [place], [place] [address] [phone]

## 2024-05-16 NOTE — Addendum Note (Signed)
 Addended by: MALACHY SLICE on: 05/16/2024 04:43 PM   Modules accepted: Orders

## 2024-05-17 ENCOUNTER — Encounter: Payer: Self-pay | Admitting: Infectious Diseases

## 2024-05-17 ENCOUNTER — Ambulatory Visit (INDEPENDENT_AMBULATORY_CARE_PROVIDER_SITE_OTHER): Admitting: Infectious Diseases

## 2024-05-17 ENCOUNTER — Other Ambulatory Visit: Payer: Self-pay

## 2024-05-17 ENCOUNTER — Ambulatory Visit: Payer: Self-pay | Admitting: Gastroenterology

## 2024-05-17 VITALS — BP 145/67 | HR 80 | Temp 98.0°F | Ht 74.5 in | Wt 153.0 lb

## 2024-05-17 DIAGNOSIS — K2970 Gastritis, unspecified, without bleeding: Secondary | ICD-10-CM | POA: Diagnosis not present

## 2024-05-17 DIAGNOSIS — B9681 Helicobacter pylori [H. pylori] as the cause of diseases classified elsewhere: Secondary | ICD-10-CM | POA: Insufficient documentation

## 2024-05-17 DIAGNOSIS — Z880 Allergy status to penicillin: Secondary | ICD-10-CM | POA: Diagnosis not present

## 2024-05-17 DIAGNOSIS — F17200 Nicotine dependence, unspecified, uncomplicated: Secondary | ICD-10-CM

## 2024-05-17 LAB — COMPREHENSIVE METABOLIC PANEL WITH GFR
AG Ratio: 1.3 (calc) (ref 1.0–2.5)
ALT: 10 U/L (ref 9–46)
AST: 20 U/L (ref 10–35)
Albumin: 3.7 g/dL (ref 3.6–5.1)
Alkaline phosphatase (APISO): 91 U/L (ref 35–144)
BUN: 12 mg/dL (ref 7–25)
CO2: 28 mmol/L (ref 20–32)
Calcium: 9 mg/dL (ref 8.6–10.3)
Chloride: 103 mmol/L (ref 98–110)
Creat: 0.96 mg/dL (ref 0.70–1.28)
Globulin: 2.8 g/dL (ref 1.9–3.7)
Glucose, Bld: 84 mg/dL (ref 65–99)
Potassium: 4.1 mmol/L (ref 3.5–5.3)
Sodium: 137 mmol/L (ref 135–146)
Total Bilirubin: 0.4 mg/dL (ref 0.2–1.2)
Total Protein: 6.5 g/dL (ref 6.1–8.1)
eGFR: 85 mL/min/1.73m2 (ref >=60)

## 2024-05-17 LAB — PSA: PSA: 4.11 ng/mL — ABNORMAL HIGH (ref <=4.00)

## 2024-05-17 LAB — LIPID PANEL
Cholesterol: 146 mg/dL (ref <200)
HDL: 67 mg/dL (ref >=40)
LDL Cholesterol (Calc): 65 mg/dL
Non-HDL Cholesterol (Calc): 79 mg/dL (ref <130)
Total CHOL/HDL Ratio: 2.2 (calc) (ref <5.0)
Triglycerides: 60 mg/dL (ref <150)

## 2024-05-17 LAB — CBC
HCT: 33.5 % — ABNORMAL LOW (ref 38.5–50.0)
Hemoglobin: 11.1 g/dL — ABNORMAL LOW (ref 13.2–17.1)
MCH: 32.3 pg (ref 27.0–33.0)
MCHC: 33.1 g/dL (ref 32.0–36.0)
MCV: 97.4 fL (ref 80.0–100.0)
MPV: 8.3 fL (ref 7.5–12.5)
Platelets: 326 Thousand/uL (ref 140–400)
RBC: 3.44 Million/uL — ABNORMAL LOW (ref 4.20–5.80)
RDW: 12.9 % (ref 11.0–15.0)
WBC: 6.3 Thousand/uL (ref 3.8–10.8)

## 2024-05-17 LAB — IRON,TIBC AND FERRITIN PANEL
%SAT: 11 % — ABNORMAL LOW (ref 20–48)
Ferritin: 130 ng/mL (ref 24–380)
Iron: 35 ug/dL — ABNORMAL LOW (ref 50–180)
TIBC: 308 ug/dL (ref 250–425)

## 2024-05-17 LAB — VITAMIN D 25 HYDROXY (VIT D DEFICIENCY, FRACTURES): Vit D, 25-Hydroxy: 25 ng/mL — ABNORMAL LOW (ref 30–100)

## 2024-05-17 NOTE — Progress Notes (Unsigned)
 Patient Active Problem List   Diagnosis Date Noted   Hematuria 12/23/2023   Loss of perception for taste 12/23/2023   Encounter for Medicare annual examination with abnormal findings 07/17/2023   Deficiency anemia 07/17/2023   Pneumonia 07/01/2023   Hemoptysis 07/01/2023   Nodule of middle lobe of right lung 07/01/2023   Benign prostatic hyperplasia with urinary frequency 02/08/2023   Urinary frequency 02/08/2023   PAD (peripheral artery disease) (HCC) 02/03/2023   Back pain 10/17/2021   Chest pain 05/19/2021   Vaccine refused by parent 06/27/2019   Taking multiple medications for chronic disease 12/08/2018   Hyperlipidemia LDL goal <70 04/16/2018   Atherosclerosis of native arteries of extremity with intermittent claudication (HCC) 01/20/2018   Vitamin D  deficiency 10/16/2017   Generalized arthritis 10/16/2017   Elevated PSA 04/30/2016   Lung nodules 02/22/2015   CAD (coronary atherosclerotic disease) 04/13/2014   Low back pain with left-sided sciatica 12/19/2008   NICOTINE  ADDICTION 02/24/2008   Essential hypertension 02/24/2008    Patient's Medications  New Prescriptions   No medications on file  Previous Medications   AMLODIPINE  (NORVASC ) 10 MG TABLET    Take 1 tablet (10 mg total) by mouth daily.   ASPIRIN  EC 81 MG TABLET    Take 81 mg by mouth daily.   ATORVASTATIN  (LIPITOR) 80 MG TABLET    TAKE 1 TABLET BY MOUTH EVERY DAY   BREO ELLIPTA  200-25 MCG/ACT AEPB    TAKE 1 PUFF BY MOUTH EVERY DAY   CILOSTAZOL  (PLETAL ) 100 MG TABLET    Take 1 tablet (100 mg total) by mouth 2 (two) times daily before a meal.   CLOTRIMAZOLE -BETAMETHASONE  (LOTRISONE ) CREAM    Apply 1 Application topically 2 (two) times daily.   FERROUS SULFATE  324 (65 FE) MG TBEC    Take 1 tablet (325 mg total) by mouth daily. Take with orange juice or vitamin C.   FINASTERIDE  (PROSCAR ) 5 MG TABLET    Take 1 tablet (5 mg total) by mouth daily.   HYDROCODONE -ACETAMINOPHEN  (NORCO) 7.5-325 MG TABLET    One  tablet every six hours as need for pain.  Must last 28 days.   LATANOPROST (XALATAN) 0.005 % OPHTHALMIC SOLUTION    1 drop daily.   PANTOPRAZOLE  (PROTONIX ) 40 MG TABLET    TAKE 1 TABLET (40 MG TOTAL) BY MOUTH TWICE A DAY BEFORE MEALS   ROSUVASTATIN  (CRESTOR ) 40 MG TABLET    Take 1 tablet (40 mg total) by mouth daily.   SPIRONOLACTONE  (ALDACTONE ) 50 MG TABLET    Take 1 tablet (50 mg total) by mouth daily.   UNABLE TO FIND    Nebulizer facemask and tubing x 1  DX J44.9   ZINC  30 MG CAPS    Take 1 capsule (30 mg total) by mouth daily.  Modified Medications   No medications on file  Discontinued Medications   No medications on file    Subjective: Discussed the use of AI scribe software for clinical note transcription with the patient, who gave verbal consent to proceed.   73 Y O Male with prior h/o asthma/copd, arthritis, HTN, HLD, PAD who is referred by GI for evaluation and management of persistent H pylori infection.   Patient is accompanied by his finace. He had an EGD done in 09/23/23 which was s/o H pylori gastritis. He was then Pylera for 10 days prescribed on 09/29/23. Fu urea breath test post completion of tx was positive in  01/15/24. Treated second time  on 01/18/24 with 14 days of optimized quadruple regimen. Fu urea breath test positive on 6/30 and hence referred to ID.  He denies missing any doses or any issues with tolerance and completed treatment as instructed.   He denies nausea, vomiting, diarrhea, or abdominal pain. Discomfort occurs only during sneezing, coughing, or brushing teeth. His weight is stable.  He reports h/o penicillin allergy from adolescence when he broke out, no other reported symptoms of facial and lip swelling, SOB etc. He smokes, with increased consumption during activities like watching sports up to more than a pack.  He is retired.   Review of Systems: all systems reviewed with pertinent positives and negatives as listed above.   Past Medical History:   Diagnosis Date   Arthritis    2012   Asthma    childhood   Carotid artery stenosis    COPD (chronic obstructive pulmonary disease) (HCC)    Glaucoma    Hyperlipidemia    1990   Hypertension    1990   Nicotine  addiction    PAD (peripheral artery disease) (HCC) 2015   Past Surgical History:  Procedure Laterality Date   BIOPSY  09/23/2023   Procedure: BIOPSY;  Surgeon: Shaaron Lamar HERO, MD;  [facility];  Service: Endoscopy;;   COLONOSCOPY WITH PROPOFOL  N/A 11/04/2021   Procedure: COLONOSCOPY WITH PROPOFOL ;  Surgeon: Cindie Carlin POUR, DO;  [facility];  Service: Endoscopy;  Laterality: N/A;  9:30am   ESOPHAGOGASTRODUODENOSCOPY (EGD) WITH PROPOFOL  N/A 09/23/2023   Procedure: ESOPHAGOGASTRODUODENOSCOPY (EGD) WITH PROPOFOL ;  Surgeon: Shaaron Lamar HERO, MD;  [facility];  Service: Endoscopy;  Laterality: N/A;  130pm, asa 3   KNEE ARTHROSCOPY     both knees    LOWER EXTREMITY ANGIOGRAPHY N/A 02/04/2018   Procedure: LOWER EXTREMITY ANGIOGRAPHY;  Surgeon: Laurence Redell CROME, MD;  Location: [facility] INVASIVE CV LAB;  Service: Cardiovascular;  Laterality: N/A;   POLYPECTOMY  11/04/2021   Procedure: POLYPECTOMY;  Surgeon: Cindie Carlin POUR, DO;  [facility];  Service: Endoscopy;;     Social History   Tobacco Use   Smoking status: Every Day    Current packs/day: 0.25    Average packs/day: 0.3 packs/day for 54.6 years (13.6 ttl pk-yrs)    Types: Cigarettes    Start date: 10/16/1969    Passive exposure: Never   Smokeless tobacco: Never  Vaping Use   Vaping status: Never Used  Substance Use Topics   Alcohol use: Yes    Comment: occassional, max of 72 oz pwr month   Drug use: No    Family History  Problem Relation Age of Onset   Heart attack Mother    Diabetes Mother    Hypertension Mother    Stroke Brother    Hypertension Sister    Hypertension Sister    Hypertension Sister    Hypertension Brother    Colon cancer Neg Hx     Allergies   Allergen Reactions   Penicillins Hives and Other (See Comments)    Has patient had a PCN reaction causing immediate rash, facial/tongue/throat swelling, SOB or lightheadedness with hypotension: Unknown Has patient had a PCN reaction causing severe rash involving mucus membranes or skin necrosis: No Has patient had a PCN reaction that required hospitalization: Yes Has patient had a PCN reaction occurring within the last 10 years: No If all of the above answers are NO, then may proceed with Cephalosporin use.     Health Maintenance  Topic  Date Due   Zoster Vaccines- Shingrix (1 of 2) 10/17/1971   Pneumococcal Vaccine: 50+ Years (2 of 2 - PCV) 05/19/2014   COVID-19 Vaccine (4 - 2024-25 season) 06/28/2023   Medicare Annual Wellness (AWV)  07/16/2024   INFLUENZA VACCINE  05/27/2024   Lung Cancer Screening  03/02/2025   Colonoscopy  11/04/2026   Hepatitis C Screening  Completed   Hepatitis B Vaccines  Aged Out   HPV VACCINES  Aged Out   Meningococcal B Vaccine  Aged Out   DTaP/Tdap/Td  Discontinued    Objective: BP (!) 145/67   Pulse 80   Temp 98 F (36.7 C) (Temporal)   Ht 6' 2.5 (1.892 m)   Wt 153 lb (69.4 kg)   SpO2 99%   BMI 19.38 kg/m    Physical Exam Constitutional:      Appearance: Normal appearance.  HENT:     Head: Normocephalic and atraumatic.      Mouth: Mucous membranes are moist.  Eyes:    Conjunctiva/sclera: Conjunctivae normal.     Pupils: Pupils are equal, round, and b/l symmetrical    Cardiovascular:     Rate and Rhythm: Normal rate and regular rhythm.     Heart sounds: s1s2  Pulmonary:     Effort: Pulmonary effort is normal.     Breath sounds: Normal breath sounds.   Abdominal:     General: Non distended     Palpations: soft.   Musculoskeletal:        General: Normal range of motion.   Skin:    General: Skin is warm and dry.     Comments:  Neurological:     General: grossly non focal     Mental Status: awake, alert and oriented to  person, place, and time.   Psychiatric:        Mood and Affect: Mood normal.   Lab Results Lab Results  Component Value Date   WBC 6.3 05/16/2024   HGB 11.1 (L) 05/16/2024   HCT 33.5 (L) 05/16/2024   MCV 97.4 05/16/2024   PLT 326 05/16/2024    Lab Results  Component Value Date   CREATININE 0.96 05/16/2024   BUN 12 05/16/2024   NA 137 05/16/2024   K 4.1 05/16/2024   CL 103 05/16/2024   CO2 28 05/16/2024    Lab Results  Component Value Date   ALT 10 05/16/2024   AST 20 05/16/2024   ALKPHOS 72 06/27/2023   BILITOT 0.4 05/16/2024    Lab Results  Component Value Date   CHOL 146 05/16/2024   HDL 67 05/16/2024   LDLCALC 65 05/16/2024   TRIG 60 05/16/2024   CHOLHDL 2.2 05/16/2024   No results found for: LABRPR, RPRTITER No results found for: HIV1RNAQUANT, HIV1RNAVL, CD4TABS   09/23/23 EGD  Moderate schatzki ring, erosive esophagitis, medium sized hiatal hernia with cameron lesions, distal gastric erosions, duodenal erosions, otherwise normal appearing bulb and second portion.   Pathology  FINAL MICROSCOPIC DIAGNOSIS:   A. STOMACH, BIOPSY:  - Gastric antral and oxyntic mucosa with Helicobacter pylori-associated  gastritis  - Helicobacter pylori-like organisms were identified on routine HE  stain   Assessment/Plan # Persistent H pylori gastritis  - s/p 2 courses of tx as below    09/29/23 Pylera for 10 days in 09/29/23   01/18/24 Optimized Quadruple regimen for 14 days    Plan - Referral to allergy for sorting out h/o penicillin allergy first, Suspect his allergy can be de-labeled and can plan  for rifabutin containing regimen thereafter - fu to be made after seeing allergy  # Penicillin allergy  - referred to allergy as above  # Smoking  - counseled    I spent 60 minutes involved in face-to-face and non-face-to-face activities for this patient on the day of the visit. Professional time spent includes the following activities: Preparing to see the  patient (review of tests), Obtaining and reviewing separately obtained history (prior GI notes), Performing a medically appropriate examination and evaluation,  referring and communicating with other health care professionals, Documenting clinical information in the EMR, Independently interpreting results (not separately reported), Communicating results to the patient/family member, Counseling and educating the patient/family member and Care coordination (not separately reported).   Of note, portions of this note may have been created with voice recognition software. While this note has been edited for accuracy, occasional wrong-word or 'sound-a-like' substitutions may have occurred due to the inherent limitations of voice recognition software.   Annalee Joseph, MD [facility] 05/17/2024, 12:51 PM

## 2024-05-19 NOTE — Telephone Encounter (Signed)
 Tried calling pt with no answer, unable to leave vm due to mailbox full.

## 2024-05-19 NOTE — Telephone Encounter (Signed)
-----   Message from Donnice Brooks sent at 05/19/2024  1:46 PM EDT ----- Please notify Kern his PSA looks good. Down to 4.11 compared to 10.8. Continue the finasteride . Thanks.  ----- Message ----- From: Kennedy Charmaine CROME, NP Sent: 05/17/2024   6:21 PM EDT To: Donnice Brooks, MD  Wanted to forward these PSA results to you. ----- Message ----- From: Interface, [facility] Results In Sent: 05/16/2024  11:28 PM EDT To: Charmaine CROME Kennedy, NP

## 2024-05-19 NOTE — Telephone Encounter (Signed)
Pt was made aware and voiced understanding

## 2024-05-20 ENCOUNTER — Ambulatory Visit (HOSPITAL_COMMUNITY)
Admission: RE | Admit: 2024-05-20 | Discharge: 2024-05-20 | Disposition: A | Discharge status: Home / Self Care | Source: Ambulatory Visit | Attending: Urology | Admitting: Urology

## 2024-05-20 DIAGNOSIS — N281 Cyst of kidney, acquired: Secondary | ICD-10-CM | POA: Diagnosis not present

## 2024-05-20 DIAGNOSIS — R310 Gross hematuria: Secondary | ICD-10-CM | POA: Insufficient documentation

## 2024-05-20 DIAGNOSIS — N400 Enlarged prostate without lower urinary tract symptoms: Secondary | ICD-10-CM | POA: Diagnosis not present

## 2024-05-20 DIAGNOSIS — I7143 Infrarenal abdominal aortic aneurysm, without rupture: Secondary | ICD-10-CM | POA: Diagnosis not present

## 2024-05-20 DIAGNOSIS — K449 Diaphragmatic hernia without obstruction or gangrene: Secondary | ICD-10-CM | POA: Diagnosis not present

## 2024-05-20 MED ORDER — IOHEXOL 300 MG/ML  SOLN
125.0000 mL | Freq: Once | INTRAMUSCULAR | Status: AC | PRN
  Administered 2024-05-20: 125 mL via INTRAVENOUS

## 2024-05-20 NOTE — Telephone Encounter (Signed)
-----   Message from Donnice Brooks sent at 05/19/2024  1:46 PM EDT ----- Please notify Kern his PSA looks good. Down to 4.11 compared to 10.8. Continue the finasteride . Thanks.  ----- Message ----- From: Kennedy Charmaine CROME, NP Sent: 05/17/2024   6:21 PM EDT To: Donnice Brooks, MD  Wanted to forward these PSA results to you. ----- Message ----- From: Interface, [facility] Results In Sent: 05/16/2024  11:28 PM EDT To: Charmaine CROME Kennedy, NP

## 2024-05-20 NOTE — Telephone Encounter (Signed)
Pt was made aware and voiced understanding

## 2024-05-23 ENCOUNTER — Telehealth: Payer: Self-pay | Admitting: Orthopaedic Surgery

## 2024-05-23 ENCOUNTER — Ambulatory Visit: Payer: Self-pay

## 2024-05-23 NOTE — Telephone Encounter (Signed)
 Dr. Janae pt - pt presented to the office requesting a refill for Hydrocodone  7.5-325 to be sent to [facility]

## 2024-05-24 MED ORDER — HYDROCODONE-ACETAMINOPHEN 7.5-325 MG PO TABS: ORAL_TABLET | ORAL | 0 refills | Status: DC

## 2024-05-25 NOTE — Telephone Encounter (Signed)
-----   Message from Donnice Brooks sent at 05/25/2024 12:21 PM EDT ----- Let Dillon [name] know his CT scan looks good and his prostate is enlarged which helps explain why his PSA is higher. I need to see him back as planned for a quick look in his bladder and continued PSA  follow-up. Thanks!!!  ----- Message ----- From: Sammie Exie HERO, CMA Sent: 05/23/2024   8:34 AM EDT To: Donnice Brooks, MD  Please review. ----- Message ----- From: Interface, Rad Results In Sent: 05/21/2024  11:58 AM EDT To: [name]

## 2024-05-30 ENCOUNTER — Ambulatory Visit: Payer: Medicare HMO

## 2024-05-30 VITALS — Ht 74.5 in | Wt 158.0 lb

## 2024-05-30 DIAGNOSIS — Z0000 Encounter for general adult medical examination without abnormal findings: Secondary | ICD-10-CM | POA: Diagnosis not present

## 2024-05-30 NOTE — Progress Notes (Signed)
 Subjective:   Dillon [name] is a 73 y.o. who presents for a Medicare Wellness preventive visit.  As a reminder, Annual Wellness Visits don't include a physical exam, and some assessments may be limited, especially if this visit is performed virtually. We may recommend an in-person follow-up visit with your provider if needed.  Visit Complete: Virtual I connected with  Dillon [name] on 05/30/24 by a audio enabled telemedicine application and verified that I am speaking with the correct person using two identifiers.  Patient Location: Home  Provider Location: Home Office  I discussed the limitations of evaluation and management by telemedicine. The patient expressed understanding and agreed to proceed.  Vital Signs: Because this visit was a virtual/telehealth visit, some criteria may be missing or patient reported. Any vitals not documented were not able to be obtained and vitals that have been documented are patient reported.  VideoDeclined- This patient declined Librarian, academic. Therefore the visit was completed with audio only.  Persons Participating in Visit: Patient.  AWV Questionnaire: No: Patient Medicare AWV questionnaire was not completed prior to this visit.  Cardiac Risk Factors include: advanced age (>66men, >57 women);dyslipidemia;hypertension;smoking/ tobacco exposure;Other (see comment);male gender, Risk factor comments: COPD, CAD     Objective:    Today's Vitals   05/30/24 0932 05/30/24 0933  Weight: 158 lb (71.7 kg)   Height: 6' 2.5 (1.892 m)   PainSc:  5    Body mass index is 20.01 kg/m.     05/30/2024    9:34 AM 09/23/2023    9:30 AM 09/18/2023   11:03 AM 05/25/2023    3:01 PM 11/04/2021    9:49 AM 11/01/2021    1:15 PM 05/08/2021    3:36 PM  Advanced Directives  Does Patient Have a Medical Advance Directive? No No No No No No Yes  Type of Advance Directive       Healthcare Power of Attorney  Does patient want to make changes  to medical advance directive?       No - Patient declined  Copy of Healthcare Power of Attorney in Chart?       No - copy requested  Would patient like information on creating a medical advance directive? Yes (MAU/Ambulatory/Procedural Areas - Information given) No - Patient declined No - Patient declined No - Patient declined No - Patient declined No - Patient declined     Current Medications (verified) Outpatient Encounter Medications as of 05/30/2024  Medication Sig   amLODipine  (NORVASC ) 10 MG tablet Take 1 tablet (10 mg total) by mouth daily.   aspirin  EC 81 MG tablet Take 81 mg by mouth daily.   atorvastatin  (LIPITOR) 80 MG tablet TAKE 1 TABLET BY MOUTH EVERY DAY   BREO ELLIPTA  200-25 MCG/ACT AEPB TAKE 1 PUFF BY MOUTH EVERY DAY   cilostazol  (PLETAL ) 100 MG tablet Take 1 tablet (100 mg total) by mouth 2 (two) times daily before a meal.   clotrimazole -betamethasone  (LOTRISONE ) cream Apply 1 Application topically 2 (two) times daily.   ferrous sulfate  324 (65 Fe) MG TBEC Take 1 tablet (325 mg total) by mouth daily. Take with orange juice or vitamin C.   finasteride  (PROSCAR ) 5 MG tablet Take 1 tablet (5 mg total) by mouth daily.   HYDROcodone -acetaminophen  (NORCO) 7.5-325 MG tablet One tablet every six hours as need for pain.  Must last 28 days.   latanoprost (XALATAN) 0.005 % ophthalmic solution 1 drop daily.   pantoprazole  (PROTONIX ) 40 MG tablet TAKE  1 TABLET (40 MG TOTAL) BY MOUTH TWICE A DAY BEFORE MEALS   rosuvastatin  (CRESTOR ) 40 MG tablet Take 1 tablet (40 mg total) by mouth daily.   spironolactone  (ALDACTONE ) 50 MG tablet Take 1 tablet (50 mg total) by mouth daily.   UNABLE TO FIND Nebulizer facemask and tubing x 1  DX J44.9   Zinc  30 MG CAPS Take 1 capsule (30 mg total) by mouth daily.   No facility-administered encounter medications on file as of 05/30/2024.    Allergies (verified) Penicillins   History: Past Medical History:  Diagnosis Date   Arthritis    2012   Asthma     childhood   Carotid artery stenosis    COPD (chronic obstructive pulmonary disease) (HCC)    Glaucoma    Hyperlipidemia    1990   Hypertension    1990   Nicotine  addiction    PAD (peripheral artery disease) (HCC) 2015   Past Surgical History:  Procedure Laterality Date   BIOPSY  09/23/2023   Procedure: BIOPSY;  Surgeon: Shaaron Lamar HERO, MD;  [facility]: [facility];  Service: Endoscopy;;   COLONOSCOPY WITH PROPOFOL  N/A 11/04/2021   Procedure: COLONOSCOPY WITH PROPOFOL ;  Surgeon: Cindie Carlin POUR, DO;  Location: [facility];  Service: Endoscopy;  Laterality: N/A;  9:30am   ESOPHAGOGASTRODUODENOSCOPY (EGD) WITH PROPOFOL  N/A 09/23/2023   Procedure: ESOPHAGOGASTRODUODENOSCOPY (EGD) WITH PROPOFOL ;  Surgeon: Shaaron Lamar HERO, MD;  Location: [facility];  Service: Endoscopy;  Laterality: N/A;  130pm, asa 3   KNEE ARTHROSCOPY     both knees    LOWER EXTREMITY ANGIOGRAPHY N/A 02/04/2018   Procedure: LOWER EXTREMITY ANGIOGRAPHY;  Surgeon: Laurence Redell CROME, MD;  Location: [facility] CV [facility];  Service: Cardiovascular;  Laterality: N/A;   POLYPECTOMY  11/04/2021   Procedure: POLYPECTOMY;  Surgeon: Cindie Carlin POUR, DO;  Location: [facility];  Service: Endoscopy;;   Family History  Problem Relation Age of Onset   Heart attack Mother    Diabetes Mother    Hypertension Mother    Stroke Brother    Hypertension Sister    Hypertension Sister    Hypertension Sister    Hypertension Brother    Colon cancer Neg Hx    Social History   Socioeconomic History   Marital status: Single    Spouse name: Not on file   Number of children: Not on file   Years of education: Not on file   Highest education level: Not on file  Occupational History   Not on file  Tobacco Use   Smoking status: Every Day    Current packs/day: 0.25    Average packs/day: 0.3 packs/day for 54.6 years (13.7 ttl pk-yrs)    Types: Cigarettes    Start date: 10/16/1969    Passive exposure: Never   Smokeless tobacco: Never   Vaping Use   Vaping status: Never Used  Substance and Sexual Activity   Alcohol use: Yes    Comment: occassional, max of 72 oz pwr month   Drug use: No   Sexual activity: Yes    Partners: Female  Other Topics Concern   Not on file  Social History Narrative   Not on file   Social Drivers of Health   Financial Resource Strain: Low Risk  (05/30/2024)   Overall Financial Resource Strain (CARDIA)    Difficulty of Paying Living Expenses: Not hard at all  Food Insecurity: No Food Insecurity (05/30/2024)   Hunger Vital Sign    Worried  About Running Out of Food in the Last Year: Never true    Ran Out of Food in the Last Year: Never true  Transportation Needs: No Transportation Needs (05/30/2024)   PRAPARE - Administrator, Civil Service (Medical): No    Lack of Transportation (Non-Medical): No  Physical Activity: Sufficiently Active (05/30/2024)   Exercise Vital Sign    Days of Exercise per Week: 7 days    Minutes of Exercise per Session: 30 min  Stress: No Stress Concern Present (05/30/2024)   [employer] of Occupational Health - Occupational Stress Questionnaire    Feeling of Stress: Not at all  Social Connections: Moderately Isolated (05/30/2024)   Social Connection and Isolation Panel    Frequency of Communication with Friends and Family: More than three times a week    Frequency of Social Gatherings with Friends and Family: More than three times a week    Attends Religious Services: More than 4 times per year    Active Member of [employer] or Organizations: No    Attends Engineer, structural: Never    Marital Status: Never married    Tobacco Counseling Ready to quit: Yes Counseling given: Yes    Clinical Intake:  Pre-visit preparation completed: Yes  Pain : 0-10 Pain Score: 5  Pain Type: Chronic pain Pain Location: Back Pain Orientation: Lower Pain Descriptors / Indicators: Constant Pain Onset: More than a month ago Pain Frequency: Constant      BMI - recorded: 20.01 Nutritional Risks: None Diabetes: No  Lab Results  Component Value Date   HGBA1C 5.2 04/16/2020     How often do you need to have someone help you when you read instructions, pamphlets, or other written materials from your doctor or pharmacy?: 1 - Never  Interpreter Needed?: No  Information entered by :: A Graclyn Lawther CMA-AAMA   Activities of Daily Living     05/30/2024    9:35 AM 09/18/2023   11:02 AM  In your present state of health, do you have any difficulty performing the following activities:  Hearing? 0 0  Vision? 0 0  Difficulty concentrating or making decisions? 0 0  Walking or climbing stairs? 0   Dressing or bathing? 0   Doing errands, shopping? 0   Preparing Food and eating ? N   Using the Toilet? N   In the past six months, have you accidently leaked urine? N   Do you have problems with loss of bowel control? N   Managing your Medications? N   Managing your Finances? N   Housekeeping or managing your Housekeeping? N     Patient Care Team: Antonetta Rollene BRAVO, MD as PCP - General (Family Medicine) Alvan, Dorn FALCON, MD as Consulting Physician (Cardiology) Ruthell Lauraine FALCON, NP as Nurse Practitioner (Pulmonary Disease) Brenna Lin, MD as Consulting Physician (Orthopedic Surgery) Alvan Dorn FALCON, MD as Consulting Physician (Cardiology)  I have updated your Care Teams any recent Medical Services you may have received from other providers in the past year.     Assessment:   This is a routine wellness examination for Dillon [name].  Hearing/Vision screen Hearing Screening - Comments:: Patient denies any hearing difficulties.   Vision Screening - Comments:: Wears rx glasses - up to date with routine eye exams with  [facility] in [place]   Goals Addressed             This Visit's Progress    Quit Smoking   On track  Would like to quit smoking. Has cut way back. GREAT JOB!       Depression Screen     05/30/2024     9:40 AM 04/14/2024    4:20 PM 12/23/2023    2:57 PM 07/15/2023    4:02 PM 07/01/2023    8:33 AM 05/25/2023    3:02 PM 02/03/2023    8:07 AM  PHQ 2/9 Scores  PHQ - 2 Score 0  0 0 0 0 0  PHQ- 9 Score 0        Exception Documentation  Patient refusal         Fall Risk     05/30/2024    9:39 AM 05/17/2024   12:55 PM 04/14/2024    4:20 PM 12/23/2023    2:57 PM 07/15/2023    4:02 PM  Fall Risk   Falls in the past year? 0 0 0 0 0  Number falls in past yr: 0 0 0 0 0  Injury with Fall? 0 0 0 0 0  Risk for fall due to : No Fall Risks No Fall Risks   No Fall Risks  Follow up Falls evaluation completed;Education provided;Falls prevention discussed Falls evaluation completed Falls evaluation completed  Falls evaluation completed    MEDICARE RISK AT HOME:  Medicare Risk at Home Any stairs in or around the home?: Yes If so, are there any without handrails?: Yes Home free of loose throw rugs in walkways, pet beds, electrical cords, etc?: Yes Adequate lighting in your home to reduce risk of falls?: Yes Life alert?: No Use of a cane, walker or w/c?: No Grab bars in the bathroom?: Yes Shower chair or bench in shower?: No Elevated toilet seat or a handicapped toilet?: No  TIMED UP AND GO:  Was the test performed?  No  Cognitive Function: 6CIT completed    05/08/2021    3:37 PM  MMSE - Mini Mental State Exam  Not completed: Unable to complete        05/30/2024    9:39 AM 05/25/2023    3:02 PM 05/09/2022    3:25 PM 05/08/2021    3:37 PM  6CIT Screen  What Year? 0 points 0 points 0 points 0 points  What month? 0 points 0 points 0 points 0 points  What time? 0 points 0 points 0 points 0 points  Count back from 20 0 points 0 points 0 points 0 points  Months in reverse 0 points 0 points 0 points 0 points  Repeat phrase 0 points 0 points 2 points 0 points  Total Score 0 points 0 points 2 points 0 points    Immunizations Immunization History  Administered Date(s) Administered   Moderna  Sars-Covid-2 Vaccination 03/08/2020, 04/05/2020, 10/19/2020   Pneumococcal Polysaccharide-23 05/19/2013   Tdap 03/26/2011   Zoster, Live 05/19/2013    Screening Tests Health Maintenance  Topic Date Due   Zoster Vaccines- Shingrix (1 of 2) 10/17/1971   Pneumococcal Vaccine: 50+ Years (2 of 2 - PCV) 05/19/2014   COVID-19 Vaccine (4 - 2024-25 season) 06/28/2023   INFLUENZA VACCINE  05/27/2024   Lung Cancer Screening  03/02/2025   Medicare Annual Wellness (AWV)  05/30/2025   Colonoscopy  11/04/2026   Hepatitis C Screening  Completed   Hepatitis B Vaccines  Aged Out   HPV VACCINES  Aged Out   Meningococcal B Vaccine  Aged Out   DTaP/Tdap/Td  Discontinued    Health Maintenance  Health Maintenance Due  Topic Date Due  Zoster Vaccines- Shingrix (1 of 2) 10/17/1971   Pneumococcal Vaccine: 50+ Years (2 of 2 - PCV) 05/19/2014   COVID-19 Vaccine (4 - 2024-25 season) 06/28/2023   INFLUENZA VACCINE  05/27/2024   Health Maintenance Items Addressed: Routine health screenings are up to date. Patient is aware of recommended vaccines.   Additional Screening:  Vision Screening: Recommended annual ophthalmology exams for early detection of glaucoma and other disorders of the eye. Would you like a referral to an eye doctor? No    Dental Screening: Recommended annual dental exams for proper oral hygiene  Community Resource Referral / Chronic Care Management: CRR required this visit?  No   CCM required this visit?  No   Plan:    I have personally reviewed and noted the following in the patient's chart:   Medical and social history Use of alcohol, tobacco or illicit drugs  Current medications and supplements including opioid prescriptions. Patient is currently taking opioid prescriptions. Information provided to patient regarding non-opioid alternatives. Patient advised to discuss non-opioid treatment plan with their provider. Functional ability and status Nutritional  status Physical activity Advanced directives List of other physicians Hospitalizations, surgeries, and ER visits in previous 12 months Vitals Screenings to include cognitive, depression, and falls Referrals and appointments  In addition, I have reviewed and discussed with patient certain preventive protocols, quality metrics, and best practice recommendations. A written personalized care plan for preventive services as well as general preventive health recommendations were provided to patient.   Floyd Lusignan, CMA   05/30/2024   After Visit Summary: (MyChart) Due to this being a telephonic visit, the after visit summary with patients personalized plan was offered to patient via MyChart   Notes: Nothing significant to report at this time.

## 2024-05-30 NOTE — Patient Instructions (Signed)
 Dillon [name] , Thank you for taking time out of your busy schedule to complete your Annual Wellness Visit with me. I enjoyed our conversation and look forward to speaking with you again next year. I, as well as your care team,  appreciate your ongoing commitment to your health goals. Please review the following plan we discussed and let me know if I can assist you in the future. Your Game plan/ To Do List    Referrals: If you haven't heard from the office you've been referred to, please reach out to them at the phone provided.  N/A Follow up Visits: We will see or speak with you next year for your Next Medicare AWV with our clinical staff  Clinician Recommendations:  Aim for 30 minutes of exercise or brisk walking, 6-8 glasses of water , and 5 servings of fruits and vegetables each day.    Wishing you many blessings and good health during the next year until our next visit.  -Emonie Espericueta   This is a list of the screenings recommended for you:  Health Maintenance  Topic Date Due   Zoster (Shingles) Vaccine (1 of 2) 10/17/1971   Pneumococcal Vaccine for age over 34 (2 of 2 - PCV) 05/19/2014   COVID-19 Vaccine (4 - 2024-25 season) 06/28/2023   Flu Shot  05/27/2024   Screening for Lung Cancer  03/02/2025   Medicare Annual Wellness Visit  05/30/2025   Colon Cancer Screening  11/04/2026   Hepatitis C Screening  Completed   Hepatitis B Vaccine  Aged Out   HPV Vaccine  Aged Out   Meningitis B Vaccine  Aged Out   DTaP/Tdap/Td vaccine  Discontinued   We will mail the Advanced Directives packet to you because you visit today was a virtual visit.   Advanced directives: (Provided) Advance directive discussed with you today. I have provided a copy for you to complete at home and have notarized. Once this is complete, please bring a copy in to our office so we can scan it into your chart.  Advance Care Planning is important because it:  [x]  Makes sure you receive the medical care that is consistent with  your values, goals, and preferences  [x]  It provides guidance to your family and loved ones and reduces their decisional burden about whether or not they are making the right decisions based on your wishes.  Follow the link provided in your after visit summary or read over the paperwork we have mailed to you to help you started getting your Advance Directives in place. If you need assistance in completing these, please reach out to us  so that we can help you!  See attachments for Preventive Care and Fall Prevention Tips.

## 2024-06-02 NOTE — Telephone Encounter (Signed)
 Called pt and given results per MD E[place]ridge. pt voiced his understanding

## 2024-06-17 ENCOUNTER — Encounter: Payer: Self-pay | Admitting: Radiology

## 2024-06-25 ENCOUNTER — Other Ambulatory Visit: Payer: Self-pay | Admitting: Family Medicine

## 2024-06-28 ENCOUNTER — Encounter: Payer: Self-pay | Admitting: Acute Care

## 2024-06-29 ENCOUNTER — Encounter: Payer: Self-pay | Admitting: Allergy & Immunology

## 2024-06-29 ENCOUNTER — Other Ambulatory Visit: Payer: Self-pay

## 2024-06-29 ENCOUNTER — Ambulatory Visit (INDEPENDENT_AMBULATORY_CARE_PROVIDER_SITE_OTHER): Admitting: Orthopaedic Surgery

## 2024-06-29 ENCOUNTER — Ambulatory Visit (INDEPENDENT_AMBULATORY_CARE_PROVIDER_SITE_OTHER): Payer: Self-pay | Admitting: Allergy & Immunology

## 2024-06-29 ENCOUNTER — Encounter: Payer: Self-pay | Admitting: Orthopaedic Surgery

## 2024-06-29 VITALS — BP 148/86 | HR 85 | Ht 74.5 in | Wt 151.0 lb

## 2024-06-29 VITALS — BP 124/80 | HR 112 | Temp 98.7°F | Resp 18 | Ht 72.0 in | Wt 149.6 lb

## 2024-06-29 DIAGNOSIS — F17200 Nicotine dependence, unspecified, uncomplicated: Secondary | ICD-10-CM

## 2024-06-29 DIAGNOSIS — M5442 Lumbago with sciatica, left side: Secondary | ICD-10-CM

## 2024-06-29 DIAGNOSIS — L500 Allergic urticaria: Secondary | ICD-10-CM

## 2024-06-29 DIAGNOSIS — G8929 Other chronic pain: Secondary | ICD-10-CM

## 2024-06-29 DIAGNOSIS — F17210 Nicotine dependence, cigarettes, uncomplicated: Secondary | ICD-10-CM

## 2024-06-29 DIAGNOSIS — Z880 Allergy status to penicillin: Secondary | ICD-10-CM

## 2024-06-29 MED ORDER — HYDROCODONE-ACETAMINOPHEN 7.5-325 MG PO TABS: ORAL_TABLET | ORAL | 0 refills | Status: DC

## 2024-06-29 NOTE — Progress Notes (Signed)
 NEW PATIENT  Date of Service/Encounter:  06/29/24  Consult requested by: Antonetta Rollene BRAVO, MD   Assessment:   Allergic urticaria  Penicillin allergy - that required hospitalizations  Current smoker  Plan/Recommendations:   1. Allergic urticaria with concern for penicillin allergy - We will you scheduled for a penicillin scratch test since you were hospitalized for your reaction. - Then we will scheduled for a penicillin challenge in the office after that.   2. Chronic rhinitis - We can do environmental allergy testing in the future if you are interested in that.   3. Return in about 1 week (around 07/06/2024) for PENIVILLIN TESTING. You can have the follow up appointment with Dr. Iva or a Nurse Practicioner (our Nurse Practitioners are excellent and always have Physician oversight!).    This note in its entirety was forwarded to the Provider who requested this consultation.  Subjective:   Dillon [name] is a 73 y.o. male presenting today for evaluation of  Chief Complaint  Patient presents with   Establish Care    Penicillin allergy.     Dillon [name] has a history of the following: Patient Active Problem List   Diagnosis Date Noted   Penicillin allergy 05/17/2024   Helicobacter pylori gastritis 05/17/2024   Hematuria 12/23/2023   Loss of perception for taste 12/23/2023   Encounter for Medicare annual examination with abnormal findings 07/17/2023   Deficiency anemia 07/17/2023   Pneumonia 07/01/2023   Hemoptysis 07/01/2023   Nodule of middle lobe of right lung 07/01/2023   Benign prostatic hyperplasia with urinary frequency 02/08/2023   Urinary frequency 02/08/2023   PAD (peripheral artery disease) (HCC) 02/03/2023   Back pain 10/17/2021   Chest pain 05/19/2021   Vaccine refused by parent 06/27/2019   Taking multiple medications for chronic disease 12/08/2018   Hyperlipidemia LDL goal <70 04/16/2018   Atherosclerosis of native arteries of extremity  with intermittent claudication (HCC) 01/20/2018   Vitamin D  deficiency 10/16/2017   Generalized arthritis 10/16/2017   Elevated PSA 04/30/2016   Lung nodules 02/22/2015   CAD (coronary atherosclerotic disease) 04/13/2014   Low back pain with left-sided sciatica 12/19/2008   NICOTINE  ADDICTION 02/24/2008   Essential hypertension 02/24/2008    History obtained from: chart review and patient.  Discussed the use of AI scribe software for clinical note transcription with the patient and/or guardian, who gave verbal consent to proceed.  Dillon [name] was referred by Antonetta Rollene BRAVO, MD.     Dillon [name] is a 73 y.o. male presenting for an evaluation of a possible penicillin allergy.  He has a history of an allergic reaction to penicillin at age 3, characterized by welts and a sensation of being beaten down. This reaction occurred after his second exposure to penicillin, leading to a two-day hospitalization where he received several injections, possibly steroids, though he is unsure of the exact treatment.  His brother also had a penicillin allergy but outgrew it. He is uncertain if he has outgrown his allergy and seeks evaluation to determine his current status.  No frequent illnesses, but he may catch a cold with weather changes. No history of asthma. He experiences mild environmental allergies, such as sneezing and itchy, watery eyes during allergy season, managed with over-the-counter medication approximately three times a week.  He had pneumonia last year, treated in the emergency room for about five hours with antibiotics. He has not received a pneumonia vaccine since then.  Otherwise, there is no history of other atopic  diseases, including drug allergies, stinging insect allergies, or contact dermatitis. There is no significant infectious history. Vaccinations are up to date.    Past Medical History: Patient Active Problem List   Diagnosis Date Noted   Penicillin allergy 05/17/2024    Helicobacter pylori gastritis 05/17/2024   Hematuria 12/23/2023   Loss of perception for taste 12/23/2023   Encounter for Medicare annual examination with abnormal findings 07/17/2023   Deficiency anemia 07/17/2023   Pneumonia 07/01/2023   Hemoptysis 07/01/2023   Nodule of middle lobe of right lung 07/01/2023   Benign prostatic hyperplasia with urinary frequency 02/08/2023   Urinary frequency 02/08/2023   PAD (peripheral artery disease) (HCC) 02/03/2023   Back pain 10/17/2021   Chest pain 05/19/2021   Vaccine refused by parent 06/27/2019   Taking multiple medications for chronic disease 12/08/2018   Hyperlipidemia LDL goal <70 04/16/2018   Atherosclerosis of native arteries of extremity with intermittent claudication (HCC) 01/20/2018   Vitamin D  deficiency 10/16/2017   Generalized arthritis 10/16/2017   Elevated PSA 04/30/2016   Lung nodules 02/22/2015   CAD (coronary atherosclerotic disease) 04/13/2014   Low back pain with left-sided sciatica 12/19/2008   NICOTINE  ADDICTION 02/24/2008   Essential hypertension 02/24/2008    Medication List:  Allergies as of 06/29/2024       Reactions   Penicillins Hives, Other (See Comments)   Has patient had a PCN reaction causing immediate rash, facial/tongue/throat swelling, SOB or lightheadedness with hypotension: Unknown Has patient had a PCN reaction causing severe rash involving mucus membranes or skin necrosis: No Has patient had a PCN reaction that required hospitalization: Yes Has patient had a PCN reaction occurring within the last 10 years: No If all of the above answers are NO, then may proceed with Cephalosporin use.        Medication List        Accurate as of June 29, 2024  2:49 PM. If you have any questions, ask your nurse or doctor.          amLODipine  10 MG tablet Commonly known as: NORVASC  Take 1 tablet (10 mg total) by mouth daily.   aspirin  EC 81 MG tablet Take 81 mg by mouth daily.   atorvastatin  80  MG tablet Commonly known as: LIPITOR TAKE 1 TABLET BY MOUTH EVERY DAY   Breo Ellipta  200-25 MCG/ACT Aepb Generic drug: fluticasone  furoate-vilanterol TAKE 1 PUFF BY MOUTH EVERY DAY   cilostazol  100 MG tablet Commonly known as: PLETAL  Take 1 tablet (100 mg total) by mouth 2 (two) times daily before a meal.   clotrimazole -betamethasone  cream Commonly known as: LOTRISONE  Apply 1 Application topically 2 (two) times daily.   ferrous sulfate  324 (65 Fe) MG Tbec Take 1 tablet (325 mg total) by mouth daily. Take with orange juice or vitamin C.   finasteride  5 MG tablet Commonly known as: PROSCAR  Take 1 tablet (5 mg total) by mouth daily.   HYDROcodone -acetaminophen  7.5-325 MG tablet Commonly known as: Norco One tablet every six hours as need for pain.  Must last 28 days.   latanoprost 0.005 % ophthalmic solution Commonly known as: XALATAN 1 drop daily.   pantoprazole  40 MG tablet Commonly known as: PROTONIX  TAKE 1 TABLET (40 MG TOTAL) BY MOUTH TWICE A DAY BEFORE MEALS   rosuvastatin  40 MG tablet Commonly known as: CRESTOR  Take 1 tablet (40 mg total) by mouth daily.   spironolactone  50 MG tablet Commonly known as: ALDACTONE  Take 1 tablet (50 mg total) by mouth daily.  UNABLE TO FIND Nebulizer facemask and tubing x 1  DX J44.9   Zinc  30 MG Caps Take 1 capsule (30 mg total) by mouth daily.        Birth History: non-contributory  Developmental History: non-contributory  Past Surgical History: Past Surgical History:  Procedure Laterality Date   BIOPSY  09/23/2023   Procedure: BIOPSY;  Surgeon: Shaaron Lamar HERO, MD;  Location: [facility];  Service: Endoscopy;;   COLONOSCOPY WITH PROPOFOL  N/A 11/04/2021   Procedure: COLONOSCOPY WITH PROPOFOL ;  Surgeon: Cindie Carlin POUR, DO;  Location: [facility];  Service: Endoscopy;  Laterality: N/A;  9:30am   ESOPHAGOGASTRODUODENOSCOPY (EGD) WITH PROPOFOL  N/A 09/23/2023   Procedure: ESOPHAGOGASTRODUODENOSCOPY (EGD) WITH  PROPOFOL ;  Surgeon: Shaaron Lamar HERO, MD;  Location: [facility];  Service: Endoscopy;  Laterality: N/A;  130pm, asa 3   KNEE ARTHROSCOPY     both knees    LOWER EXTREMITY ANGIOGRAPHY N/A 02/04/2018   Procedure: LOWER EXTREMITY ANGIOGRAPHY;  Surgeon: Laurence Redell CROME, MD;  Location: [facility] INVASIVE CV LAB;  Service: Cardiovascular;  Laterality: N/A;   POLYPECTOMY  11/04/2021   Procedure: POLYPECTOMY;  Surgeon: Cindie Carlin POUR, DO;  Location: [facility];  Service: Endoscopy;;     Family History: Family History  Problem Relation Age of Onset   Heart attack Mother    Diabetes Mother    Hypertension Mother    Stroke Brother    Hypertension Sister    Hypertension Sister    Hypertension Sister    Hypertension Brother    Colon cancer Neg Hx      Social History: Kathy lives at home with his family.  They live in a house that is 72 years old.  There is 1 throughout the home.  They have kerosene and wood for heating and window units and plans for cooling.  There is 1 dog outside of the home.  There are dust mite covers on the bed and the pillows.  There is tobacco exposure in the house in the car.  He is currently retired.  There is no fume, chemical, or dust exposure.  He does not have a HEPA filter.  He [place] near [place].  He has been a smoker for 50+ years.   Review of systems otherwise negative other than that mentioned in the HPI.    Objective:   Blood pressure 124/80, pulse (!) 112, temperature 98.7 F (37.1 C), temperature source Temporal, resp. rate 18, height 6' (1.829 m), weight 149 lb 9.6 oz (67.9 kg), SpO2 93%. Body mass index is 20.29 kg/m.     Physical Exam Vitals reviewed.  Constitutional:      Appearance: He is well-developed.     Comments: Adorable personable.   HENT:     Head: Normocephalic and atraumatic.     Right Ear: Tympanic membrane, ear canal and external ear normal. No drainage, swelling or tenderness. Tympanic membrane is not injected, scarred,  erythematous, retracted or bulging.     Left Ear: Tympanic membrane, ear canal and external ear normal. No drainage, swelling or tenderness. Tympanic membrane is not injected, scarred, erythematous, retracted or bulging.     Nose: No nasal deformity, septal deviation, mucosal edema or rhinorrhea.     Right Turbinates: Enlarged, swollen and pale.     Left Turbinates: Enlarged, swollen and pale.     Right Sinus: No maxillary sinus tenderness or frontal sinus tenderness.     Left Sinus: No maxillary sinus tenderness or frontal sinus tenderness.  Mouth/Throat:     Lips: Pink.     Mouth: Mucous membranes are moist. Mucous membranes are not pale and not dry.     Pharynx: Uvula midline.  Eyes:     General: Lids are normal. Allergic shiner present.        Right eye: No discharge.        Left eye: No discharge.     Conjunctiva/sclera: Conjunctivae normal.     Right eye: Right conjunctiva is not injected. No chemosis.    Left eye: Left conjunctiva is not injected. No chemosis.    Pupils: Pupils are equal, round, and reactive to light.  Cardiovascular:     Rate and Rhythm: Normal rate and regular rhythm.     Heart sounds: Normal heart sounds.  Pulmonary:     Effort: Pulmonary effort is normal. No tachypnea, accessory muscle usage or respiratory distress.     Breath sounds: Normal breath sounds. No wheezing, rhonchi or rales.  Chest:     Chest wall: No tenderness.  Abdominal:     Tenderness: There is no abdominal tenderness. There is no guarding or rebound.  Lymphadenopathy:     Head:     Right side of head: No submandibular, tonsillar or occipital adenopathy.     Left side of head: No submandibular, tonsillar or occipital adenopathy.     Cervical: No cervical adenopathy.  Skin:    Coloration: Skin is not pale.     Findings: No abrasion, erythema, petechiae or rash. Rash is not papular, urticarial or vesicular.  Neurological:     Mental Status: He is alert.  Psychiatric:         Behavior: Behavior is cooperative.      Diagnostic studies: none         Marty Shaggy, MD [facility] 

## 2024-06-29 NOTE — Patient Instructions (Addendum)
 1. Allergic urticaria with concern for penicillin allergy - We will you scheduled for a penicillin scratch test since you were hospitalized for your reaction. - Then we will scheduled for a penicillin challenge in the office after that.   2. Chronic rhinitis - We can do environmental allergy testing in the future if you are interested in that.   3. Return in about 1 week (around 07/06/2024) for PENIVILLIN TESTING. You can have the follow up appointment with Dr. Iva or a Nurse Practicioner (our Nurse Practitioners are excellent and always have Physician oversight!).    Please inform us  of any Emergency Department visits, hospitalizations, or changes in symptoms. Call us  before going to the ED for breathing or allergy symptoms since we might be able to fit you in for a sick visit. Feel free to contact us  anytime with any questions, problems, or concerns.  It was a pleasure to meet you today!  Websites that have reliable patient information: 1. American Academy of Asthma, Allergy, and Immunology: www.aaaai.org 2. Food Allergy Research and Education (FARE): foodallergy.org 3. Mothers of Asthmatics: http://www.asthmacommunitynetwork.org 4. American College of Allergy, Asthma, and Immunology: www.acaai.org      "Like" us  on Facebook and Instagram for our latest updates!      A healthy democracy works best when [employer] participate! Make sure you are registered to vote! If you have moved or changed any of your contact information, you will need to get this updated before voting! Scan the QR codes below to learn more!

## 2024-06-29 NOTE — Progress Notes (Signed)
 My back has been tender lately  He has had more lower back pain recently but no trauma, no weakness.    Spine/Pelvis examination:  Inspection:  Overall, sacoiliac joint benign and hips nontender; without crepitus or defects.   Thoracic spine inspection: Alignment normal without kyphosis present   Lumbar spine inspection:  Alignment  with normal lumbar lordosis, without scoliosis apparent.   Thoracic spine palpation:  without tenderness of spinal processes   Lumbar spine palpation: without tenderness of lumbar area; without tightness of lumbar muscles    Range of Motion:   Lumbar flexion, forward flexion is normal without pain or tenderness    Lumbar extension is full without pain or tenderness   Left lateral bend is normal without pain or tenderness   Right lateral bend is normal without pain or tenderness   Straight leg raising is normal  Strength & tone: normal   Stability overall normal stability  Encounter Diagnoses  Name Primary?   Chronic midline low back pain with left-sided sciatica Yes   Cigarette nicotine  dependence without complication    Return in three months.  I have reviewed the [place]  Controlled Substance Reporting System web site prior to prescribing narcotic medicine for this patient.   I have informed the patient I will be retiring from medical practice and from this office on July 28, 2024.  The patient has been offered continuing care with Dr. Margrette or Dr. Onesimo of this office.  The patient may choose another provider and the records will be forwarded after proper signature and notification.  Patient understands and agrees.  He would like to see Dr. Margrette in followup.  Call if any problem.  Precautions discussed.  Electronically Signed Lemond Stable, MD 9/3/202510:16 AM

## 2024-07-15 ENCOUNTER — Ambulatory Visit: Admitting: Allergy & Immunology

## 2024-07-16 ENCOUNTER — Other Ambulatory Visit: Payer: Self-pay | Admitting: Student

## 2024-07-26 ENCOUNTER — Telehealth: Payer: Self-pay | Admitting: Orthopaedic Surgery

## 2024-07-26 MED ORDER — HYDROCODONE-ACETAMINOPHEN 7.5-325 MG PO TABS: ORAL_TABLET | ORAL | 0 refills | Status: DC

## 2024-07-26 NOTE — Telephone Encounter (Signed)
 Patient came in the office to get refill on his pain medicine    HYDROcodone -acetaminophen  (NORCO) 7.5-325 MG tablet    Pharmacy:  [facility]  [place]

## 2024-08-01 ENCOUNTER — Other Ambulatory Visit: Admitting: Urology

## 2024-08-03 ENCOUNTER — Ambulatory Visit: Admitting: Orthopaedic Surgery

## 2024-08-12 ENCOUNTER — Other Ambulatory Visit: Payer: Self-pay | Admitting: Gastroenterology

## 2024-08-22 ENCOUNTER — Other Ambulatory Visit: Payer: Self-pay | Admitting: Gastroenterology

## 2024-08-22 ENCOUNTER — Other Ambulatory Visit: Payer: Self-pay | Admitting: Family Medicine

## 2024-08-22 ENCOUNTER — Other Ambulatory Visit: Payer: Self-pay | Admitting: Internal Medicine

## 2024-08-26 ENCOUNTER — Ambulatory Visit: Admitting: Family Medicine

## 2024-08-29 ENCOUNTER — Encounter: Payer: Self-pay | Admitting: Radiology

## 2024-09-02 ENCOUNTER — Other Ambulatory Visit (INDEPENDENT_AMBULATORY_CARE_PROVIDER_SITE_OTHER): Payer: Self-pay

## 2024-09-02 ENCOUNTER — Ambulatory Visit: Admitting: Orthopedic Surgery

## 2024-09-02 ENCOUNTER — Encounter: Payer: Self-pay | Admitting: Orthopedic Surgery

## 2024-09-02 VITALS — Wt 153.8 lb

## 2024-09-02 DIAGNOSIS — G8929 Other chronic pain: Secondary | ICD-10-CM

## 2024-09-02 DIAGNOSIS — M5442 Lumbago with sciatica, left side: Secondary | ICD-10-CM

## 2024-09-02 DIAGNOSIS — M25561 Pain in right knee: Secondary | ICD-10-CM

## 2024-09-02 DIAGNOSIS — M25562 Pain in left knee: Secondary | ICD-10-CM

## 2024-09-02 MED ORDER — HYDROCODONE-ACETAMINOPHEN 7.5-325 MG PO TABS: ORAL_TABLET | ORAL | 0 refills | Status: AC

## 2024-09-02 NOTE — Addendum Note (Signed)
 Addended by: VICENTA EMMIE HERO on: 09/02/2024 12:21 PM   Modules accepted: Orders

## 2024-09-02 NOTE — Progress Notes (Signed)
   Chief Complaint  Patient presents with   Back Pain   Knee Pain     BILATERAL CHR KNEE PAIN    73 year old male previous patient of Dr. Janae followedfor chronic lower back pain bilateral knee pain  Prior treatments included bilateral knee injections which no longer are effective  Back pain and knee pain are managed with hydrocodone  Monthly prescriptions  Patient complains of 7 out of 10 back pain when its at its worst  Patient does not want to proceed with knee replacements  FOCUSED EXAM:   Tenderness over medial and lateral joint lines of right knee with range of motion 0-1 25 No signs of swelling or effusion No instability noted  Imaging  DG Lumbar Spine 2-3 Views Result Date: 09/02/2024 Images lumbar spine Chronic lower back pain Patient has severe scoliosis in his lumbar spine with severe degenerative disc disease at L4-5 L5-S1 L3-4 L2-3 Facet arthritis Impression severe scoliosis and facet arthritis degenerative disc disease   Meds ordered this encounter  Medications   HYDROcodone -acetaminophen  (NORCO) 7.5-325 MG tablet    Sig: One tablet every six hours as need for pain.  Must last 28 days.    Dispense:  110 tablet    Refill:  0   Assessment and plan  73 year old male chronic opioid management, chronic lower back pain not interested in surgery.  Also has chronic knee pain does not want to have surgical intervention at this time  The patient will be continued on his current pain medication with appropriate referral

## 2024-09-02 NOTE — Patient Instructions (Signed)
 Instructions regarding chronic pain management   You have chronic pain.  You are on chronic opioid therapy.  You will be referred to a chronic pain chronic opioid therapy specialist.  You have 30 days from today to continue getting your medications from this office.  After 30 days you would no longer get prescriptions for opioids from [facility].

## 2024-09-08 DIAGNOSIS — H52229 Regular astigmatism, unspecified eye: Secondary | ICD-10-CM | POA: Diagnosis not present

## 2024-09-08 DIAGNOSIS — H5202 Hypermetropia, left eye: Secondary | ICD-10-CM | POA: Diagnosis not present

## 2024-09-08 DIAGNOSIS — H5211 Myopia, right eye: Secondary | ICD-10-CM | POA: Diagnosis not present

## 2024-09-08 DIAGNOSIS — H25813 Combined forms of age-related cataract, bilateral: Secondary | ICD-10-CM | POA: Diagnosis not present

## 2024-09-08 DIAGNOSIS — H401113 Primary open-angle glaucoma, right eye, severe stage: Secondary | ICD-10-CM | POA: Diagnosis not present

## 2024-09-08 DIAGNOSIS — H401121 Primary open-angle glaucoma, left eye, mild stage: Secondary | ICD-10-CM | POA: Diagnosis not present

## 2024-09-20 ENCOUNTER — Encounter: Payer: Self-pay | Admitting: Gastroenterology

## 2024-09-27 ENCOUNTER — Other Ambulatory Visit: Payer: Self-pay

## 2024-09-27 MED ORDER — CILOSTAZOL 100 MG PO TABS: 100.0000 mg | ORAL_TABLET | Freq: Two times a day (BID) | ORAL | 5 refills | Status: AC

## 2024-09-28 ENCOUNTER — Ambulatory Visit: Admitting: Orthopedic Surgery

## 2024-10-10 ENCOUNTER — Other Ambulatory Visit: Admitting: Urology

## 2024-11-03 ENCOUNTER — Ambulatory Visit: Admitting: Cardiology

## 2024-11-03 NOTE — Progress Notes (Unsigned)
 "     Clinical Summary Dillon [name] is a 74 y.o.male seen today for follow up of the following medical problems.      1.CAD - CT scan chest 01/2014 with incidental finding of coronary calcification - CAD risk factors: HTN, tobacco, FH with sister stents in her 17s, hyperlipidemia - 06/2015 nuclear stress no ischemia       - 07/31/22 note from pcp recent chest pain - ongoing several months - scratchy like feeling midchest. Can occur at rest or with activity. No relation to food. +SOB, some nausea. Lasts just a few minutes. Not positional - baking soda helps.    - no exertional chest pains, some DOE with activities which new x 1 year.  - no recent edema, no cough but occasioanl wheezing.  - not able to travel to [place]. Cannot runo n treadmill due to PAD.      11/2022 nuclear stress: no current ischemia, prior inferoseptal infarct.    Other medical problems not addressed this visit     2. HTN - he is compliant with meds     3. HL 09/2017 TC 174 HDL 59 TG 46 LDL 101 - 03/2018 TC 190 TG 74 HDL 61 LDL 113 - he is compliant with statin   4. PAD - followed by vascular - prior workup including ABIs, lower extremity angio - no recent symptoms   5. COPD - compliant with meds   6. Carotid stenosis - 11/2017 carotid US : 50-69% RICA, LICA <50%   Past Medical History:  Diagnosis Date   Arthritis    2012   Asthma    childhood   Carotid artery stenosis    COPD (chronic obstructive pulmonary disease) (HCC)    Glaucoma    Hyperlipidemia    1990   Hypertension    1990   Nicotine  addiction    PAD (peripheral artery disease) 2015     Allergies[1]   Current Outpatient Medications  Medication Sig Dispense Refill   amLODipine  (NORVASC ) 10 MG tablet Take 1 tablet (10 mg total) by mouth daily. 90 tablet 1   aspirin  EC 81 MG tablet Take 81 mg by mouth daily.     atorvastatin  (LIPITOR) 80 MG tablet TAKE 1 TABLET BY MOUTH EVERY DAY 90 tablet 3   BREO ELLIPTA  200-25 MCG/ACT  AEPB TAKE 1 PUFF BY MOUTH EVERY DAY 60 each 5   cilostazol  (PLETAL ) 100 MG tablet Take 1 tablet (100 mg total) by mouth 2 (two) times daily before a meal. 60 tablet 5   clotrimazole -betamethasone  (LOTRISONE ) cream Apply 1 Application topically 2 (two) times daily. 45 g 1   ferrous sulfate  324 (65 Fe) MG TBEC Take 1 tablet (325 mg total) by mouth daily. Take with orange juice or vitamin C. 30 tablet 5   finasteride  (PROSCAR ) 5 MG tablet Take 1 tablet (5 mg total) by mouth daily. 90 tablet 3   HYDROcodone -acetaminophen  (NORCO) 7.5-325 MG tablet One tablet every six hours as need for pain.  Must last 28 days. 110 tablet 0   latanoprost (XALATAN) 0.005 % ophthalmic solution 1 drop daily.     pantoprazole  (PROTONIX ) 40 MG tablet TAKE 1 TABLET (40 MG TOTAL) BY MOUTH TWICE A DAY BEFORE MEALS 180 tablet 1   rosuvastatin  (CRESTOR ) 40 MG tablet TAKE 1 TABLET BY MOUTH EVERY DAY 90 tablet 1   spironolactone  (ALDACTONE ) 50 MG tablet Take 1 tablet (50 mg total) by mouth daily. 90 tablet 1   UNABLE TO FIND Nebulizer facemask  and tubing x 1  DX J44.9 1 each 0   Zinc  30 MG CAPS Take 1 capsule (30 mg total) by mouth daily. 30 capsule 1   No current facility-administered medications for this visit.     Past Surgical History:  Procedure Laterality Date   BIOPSY  09/23/2023   Procedure: BIOPSY;  Surgeon: Shaaron Lamar HERO, MD;  Location: [facility];  Service: Endoscopy;;   COLONOSCOPY WITH PROPOFOL  N/A 11/04/2021   Procedure: COLONOSCOPY WITH PROPOFOL ;  Surgeon: Cindie Carlin POUR, DO;  Location: [facility];  Service: Endoscopy;  Laterality: N/A;  9:30am   ESOPHAGOGASTRODUODENOSCOPY (EGD) WITH PROPOFOL  N/A 09/23/2023   Procedure: ESOPHAGOGASTRODUODENOSCOPY (EGD) WITH PROPOFOL ;  Surgeon: Shaaron Lamar HERO, MD;  Location: [facility];  Service: Endoscopy;  Laterality: N/A;  130pm, asa 3   KNEE ARTHROSCOPY     both knees    LOWER EXTREMITY ANGIOGRAPHY N/A 02/04/2018   Procedure: LOWER EXTREMITY ANGIOGRAPHY;   Surgeon: Laurence Redell CROME, MD;  Location: [facility];  Service: Cardiovascular;  Laterality: N/A;   POLYPECTOMY  11/04/2021   Procedure: POLYPECTOMY;  Surgeon: Cindie Carlin POUR, DO;  Location: [facility];  Service: Endoscopy;;     Allergies[2]    Family History  Problem Relation Age of Onset   Heart attack Mother    Diabetes Mother    Hypertension Mother    Stroke Brother    Hypertension Sister    Hypertension Sister    Hypertension Sister    Hypertension Brother    Colon cancer Neg Hx      Social History Dillon [name] reports that he has been smoking cigarettes. He started smoking about 55 years ago. He has a 13.8 pack-year smoking history. He has never been exposed to tobacco smoke. He has never used smokeless tobacco. Dillon [name] reports current alcohol use.   Review of Systems CONSTITUTIONAL: No weight loss, fever, chills, weakness or fatigue.  HEENT: Eyes: No visual loss, blurred vision, double vision or yellow sclerae.No hearing loss, sneezing, congestion, runny nose or sore throat.  SKIN: No rash or itching.  CARDIOVASCULAR:  RESPIRATORY: No shortness of breath, cough or sputum.  GASTROINTESTINAL: No anorexia, nausea, vomiting or diarrhea. No abdominal pain or blood.  GENITOURINARY: No burning on urination, no polyuria NEUROLOGICAL: No headache, dizziness, syncope, paralysis, ataxia, numbness or tingling in the extremities. No change in bowel or bladder control.  MUSCULOSKELETAL: No muscle, back pain, joint pain or stiffness.  LYMPHATICS: No enlarged nodes. No history of splenectomy.  PSYCHIATRIC: No history of depression or anxiety.  ENDOCRINOLOGIC: No reports of sweating, cold or heat intolerance. No polyuria or polydipsia.  SABRA   Physical Examination There were no vitals filed for this visit. There were no vitals filed for this visit.  Gen: resting comfortably, no acute distress HEENT: no scleral icterus, pupils equal round and reactive, no palptable  cervical adenopathy,  CV Resp: Clear to auscultation bilaterally GI: abdomen is soft, non-tender, non-distended, normal bowel sounds, no hepatosplenomegaly MSK: extremities are warm, no edema.  Skin: warm, no rash Neuro:  no focal deficits Psych: appropriate affect   Diagnostic Studies  06/2015 exercise nuclear stress Exercise limited by possible right leg claudication. There was no diagnostic ST segment deviation noted during Lexiscan  infusion. Blood pressure demonstrated a hypertensive response to exercise. There is a small defect of mild severity present in the mid inferoseptal, apical septal and apical inferior location. The defect is non-reversible. Most consistent with soft tissue attenuation, no clear evidence  of scar or ischemia. This is a low risk study. Nuclear stress EF: 59%.     08/2018 ABI Right: Resting right ankle-brachial index indicates moderate right lower extremity arterial disease. The right toe-brachial index is abnormal. RT great toe pressure = 55 mmHg.   Left: Resting left ankle-brachial index is within normal range. No evidence of significant left lower extremity arterial disease. The left toe-brachial index is abnormal. LT Great toe pressure = 82 mmHg.   01/2018 LE angiography Ectatic segments suggestive of abdominal aortic aneurysm and bilateral common iliac artery aneurysms Occluded right SFA with collateral reconstitution of proximal AT and TPT Patent but diseased L SFA with serial stenosis in mid-segment and sub-total occlusion distally leading into intact popliteal flow into PT and peroneal Additional studies will be needed to interrogate the anatomy of the aortoiliac arteries and bilateral femoropopliteal arteries Right leg minimally needs R common femoral artery to distal popliteal artery bypass with popliteal artery aneurysm exclusion   11/2018 carotid US  IMPRESSION: There is 50-69% stenosis in the right internal carotid artery.   Less than 50%  stenosis in the left internal carotid artery.    11/2022 nuclear stress  Findings are consistent with prior inferoseptal infarction. There is no current ischemia The study is low risk.   No ST deviation was noted.   LV perfusion is abnormal. Moderate size mild to moderate intensity fixed inferoseptal defect   Left ventricular function is normal. Nuclear stress EF: 61 %. The left ventricular ejection fraction is normal (55-65%). End diastolic cavity size is normal.   Assessment and Plan   1. Chest pain/DOE - chest pain symptoms very atypical however recent DOE in setting of known PAD and other risk factors - will start workup with echo, pending results would plan for lexiscan . Not able to run on treadmill due to PAD and claudication, travel to [place] not easy for him for a CTA     Dorn PHEBE Ross, M.D., F.A.C.C.    [1]  Allergies Allergen Reactions   Penicillins Hives and Other (See Comments)    Has patient had a PCN reaction causing immediate rash, facial/tongue/throat swelling, SOB or lightheadedness with hypotension: Unknown Has patient had a PCN reaction causing severe rash involving mucus membranes or skin necrosis: No Has patient had a PCN reaction that required hospitalization: Yes Has patient had a PCN reaction occurring within the last 10 years: No If all of the above answers are NO, then may proceed with Cephalosporin use.   [2]  Allergies Allergen Reactions   Penicillins Hives and Other (See Comments)    Has patient had a PCN reaction causing immediate rash, facial/tongue/throat swelling, SOB or lightheadedness with hypotension: Unknown Has patient had a PCN reaction causing severe rash involving mucus membranes or skin necrosis: No Has patient had a PCN reaction that required hospitalization: Yes Has patient had a PCN reaction occurring within the last 10 years: No If all of the above answers are NO, then may proceed with Cephalosporin use.    "

## 2024-11-19 ENCOUNTER — Other Ambulatory Visit: Payer: Self-pay | Admitting: Family Medicine

## 2024-11-22 ENCOUNTER — Ambulatory Visit: Admitting: Family Medicine

## 2024-11-23 ENCOUNTER — Other Ambulatory Visit: Payer: Self-pay | Admitting: Family Medicine

## 2024-11-28 ENCOUNTER — Other Ambulatory Visit: Admitting: Urology

## 2025-01-11 ENCOUNTER — Ambulatory Visit: Admitting: Family Medicine

## 2025-02-13 ENCOUNTER — Ambulatory Visit: Admitting: Cardiology

## 2025-02-27 ENCOUNTER — Other Ambulatory Visit: Admitting: Urology

## 2025-05-31 ENCOUNTER — Ambulatory Visit
